# Patient Record
Sex: Female | Born: 1955
Health system: Southern US, Community
[De-identification: ages and names within clinical notes are randomized; demographics above are authoritative.]

## PROBLEM LIST (undated history)

## (undated) DIAGNOSIS — M5430 Sciatica, unspecified side: Secondary | ICD-10-CM

## (undated) DIAGNOSIS — I10 Essential (primary) hypertension: Secondary | ICD-10-CM

## (undated) DIAGNOSIS — E039 Hypothyroidism, unspecified: Secondary | ICD-10-CM

## (undated) DIAGNOSIS — K5900 Constipation, unspecified: Secondary | ICD-10-CM

## (undated) DIAGNOSIS — E785 Hyperlipidemia, unspecified: Secondary | ICD-10-CM

## (undated) DIAGNOSIS — E079 Disorder of thyroid, unspecified: Secondary | ICD-10-CM

## (undated) DIAGNOSIS — J45909 Unspecified asthma, uncomplicated: Secondary | ICD-10-CM

## (undated) DIAGNOSIS — L509 Urticaria, unspecified: Secondary | ICD-10-CM

## (undated) HISTORY — DX: Constipation, unspecified: K59.00

## (undated) HISTORY — DX: Sciatica, unspecified side: M54.30

## (undated) HISTORY — DX: Unspecified asthma, uncomplicated: J45.909

## (undated) HISTORY — DX: Urticaria, unspecified: L50.9

## (undated) HISTORY — PX: CATARACT EXTRACTION: SUR2

## (undated) NOTE — *Deleted (*Deleted)
[facility] [address] [place], [place], [address] Phone: [phone]   Fax:  [phone]  January 25, 2020   No Recipients  Physical Therapy Discharge Summary  Patient: Alexandra Brewer  MRN: [id number]  Date of Birth: 10-16-1955   Diagnosis: Radiculopathy, cervical region  Spondylolisthesis of lumbar region  Chronic low back pain without sciatica, unspecified back pain laterality  Abnormal posture  Other muscle spasm Referring Provider (PT): Juleen Starr PA-C    The above patient had been seen in Physical Therapy *** times of *** treatments scheduled with *** no shows and *** cancellations.  The treatment consisted of *** The patient is: {improved/worse/unchanged:[id number]}  Subjective: ***  Discharge Findings: ***  Functional Status at Discharge: ***  {ONGEX:[id number]}   Plan - 01/25/20 1734    Clinical Impression Statement Pt's neck and shoulder have improved and the focus of today's visit was on their lower back. Manual examation of the lumbar paraspinals and gluteal muscles was performed and these locations were tender to palpation so the patient was educated on the self-massage with the tennis ball exercise. Patient was given a HEP focusing on the lumbar spine and LE, including LTRs, supine clamshells with red theraband, supine marches, piriformis stretch, and seated hamstring stretch. Patient is discharged due to acheivement of goals.    Examination-Activity Limitations Carry;Lift;Reach Overhead    Examination-Participation Restrictions Cleaning;Laundry;Shop    Stability/Clinical Decision Making Evolving/Moderate complexity    Clinical Decision Making Moderate    Rehab Potential Good    PT Treatment/Interventions ADLs/Self Care Home Management;Electrical Stimulation;Iontophoresis 4mg /ml Dexamethasone;Moist Heat;Cryotherapy;Gait training;Stair training;Functional mobility training;Therapeutic  activities;Therapeutic exercise;Neuromuscular re-education;Patient/family education;Passive range of motion;Manual techniques;Taping;Dry needling    PT Next Visit Plan Discharge    PT Home Exercise Plan Continue with cervical, shoulder, and lumbar HEPs    Consulted and Agree with Plan of Care Patient           Sincerely,   Johnn Hai, SPT   CC No Recipients  [facility] [address] [place], [place], [address] Phone: [phone]   Fax:  [phone]  Patient: Alexandra Brewer  MRN: [id number]  Date of Birth: 1956/03/21

---

## 2001-12-08 ENCOUNTER — Emergency Department (HOSPITAL_COMMUNITY): Admission: EM | Admit: 2001-12-08 | Discharge: 2001-12-09 | Payer: Self-pay | Admitting: Emergency Medicine

## 2004-12-27 ENCOUNTER — Ambulatory Visit (HOSPITAL_COMMUNITY): Admission: RE | Admit: 2004-12-27 | Discharge: 2004-12-27 | Payer: Self-pay | Admitting: Internal Medicine

## 2005-12-12 ENCOUNTER — Ambulatory Visit: Payer: Self-pay | Admitting: Family Medicine

## 2005-12-13 ENCOUNTER — Ambulatory Visit: Payer: Self-pay | Admitting: *Deleted

## 2006-01-07 ENCOUNTER — Ambulatory Visit: Payer: Self-pay | Admitting: Family Medicine

## 2006-02-14 ENCOUNTER — Ambulatory Visit: Payer: Self-pay | Admitting: Family Medicine

## 2006-05-27 ENCOUNTER — Ambulatory Visit: Payer: Self-pay | Admitting: Family Medicine

## 2006-07-10 ENCOUNTER — Ambulatory Visit: Payer: Self-pay | Admitting: Family Medicine

## 2006-07-17 ENCOUNTER — Ambulatory Visit (HOSPITAL_COMMUNITY): Admission: RE | Admit: 2006-07-17 | Discharge: 2006-07-17 | Payer: Self-pay | Admitting: Internal Medicine

## 2006-08-08 ENCOUNTER — Ambulatory Visit: Payer: Self-pay | Admitting: Internal Medicine

## 2006-08-12 ENCOUNTER — Ambulatory Visit (HOSPITAL_COMMUNITY): Admission: RE | Admit: 2006-08-12 | Discharge: 2006-08-12 | Payer: Self-pay | Admitting: Internal Medicine

## 2006-09-24 ENCOUNTER — Ambulatory Visit: Payer: Self-pay | Admitting: Internal Medicine

## 2006-12-18 ENCOUNTER — Encounter (INDEPENDENT_AMBULATORY_CARE_PROVIDER_SITE_OTHER): Payer: Self-pay | Admitting: *Deleted

## 2007-02-20 ENCOUNTER — Ambulatory Visit: Payer: Self-pay | Admitting: Internal Medicine

## 2007-04-07 ENCOUNTER — Ambulatory Visit: Payer: Self-pay | Admitting: Family Medicine

## 2007-06-13 ENCOUNTER — Ambulatory Visit: Payer: Self-pay | Admitting: Internal Medicine

## 2007-07-03 ENCOUNTER — Ambulatory Visit: Payer: Self-pay | Admitting: Internal Medicine

## 2007-09-29 ENCOUNTER — Ambulatory Visit: Payer: Self-pay | Admitting: Internal Medicine

## 2007-09-29 ENCOUNTER — Encounter: Payer: Self-pay | Admitting: Family Medicine

## 2007-09-29 LAB — CONVERTED CEMR LAB
Chlamydia, DNA Probe: NEGATIVE
GC Probe Amp, Genital: NEGATIVE

## 2007-10-08 ENCOUNTER — Encounter: Payer: Self-pay | Admitting: Family Medicine

## 2007-10-08 ENCOUNTER — Ambulatory Visit: Payer: Self-pay | Admitting: Internal Medicine

## 2007-10-08 ENCOUNTER — Ambulatory Visit (HOSPITAL_COMMUNITY): Admission: RE | Admit: 2007-10-08 | Discharge: 2007-10-08 | Payer: Self-pay | Admitting: Family Medicine

## 2007-10-08 LAB — CONVERTED CEMR LAB
BUN: 8 mg/dL (ref 6–23)
Basophils Absolute: 0 10*3/uL (ref 0.0–0.1)
Basophils Relative: 1 % (ref 0–1)
CO2: 22 meq/L (ref 19–32)
Calcium: 9.5 mg/dL (ref 8.4–10.5)
Cholesterol: 161 mg/dL (ref 0–200)
Eosinophils Absolute: 0.1 10*3/uL (ref 0.0–0.7)
Hemoglobin: 13.8 g/dL (ref 12.0–15.0)
Lymphs Abs: 2.2 10*3/uL (ref 0.7–4.0)
MCHC: 32.2 g/dL (ref 30.0–36.0)
MCV: 90.7 fL (ref 78.0–100.0)
Monocytes Absolute: 0.6 10*3/uL (ref 0.1–1.0)
Monocytes Relative: 9 % (ref 3–12)
Neutro Abs: 4.4 10*3/uL (ref 1.7–7.7)
Potassium: 4.1 meq/L (ref 3.5–5.3)
Total Bilirubin: 0.4 mg/dL (ref 0.3–1.2)

## 2009-11-03 ENCOUNTER — Emergency Department (HOSPITAL_COMMUNITY): Admission: EM | Admit: 2009-11-03 | Discharge: 2009-11-03 | Payer: Self-pay | Admitting: Emergency Medicine

## 2009-11-05 ENCOUNTER — Emergency Department (HOSPITAL_COMMUNITY): Admission: EM | Admit: 2009-11-05 | Discharge: 2009-11-05 | Payer: Self-pay | Admitting: Emergency Medicine

## 2009-11-21 ENCOUNTER — Ambulatory Visit (HOSPITAL_COMMUNITY): Admission: RE | Admit: 2009-11-21 | Discharge: 2009-11-21 | Payer: Self-pay | Admitting: General Surgery

## 2010-04-23 ENCOUNTER — Encounter: Payer: Self-pay | Admitting: Internal Medicine

## 2010-06-16 LAB — COMPREHENSIVE METABOLIC PANEL
ALT: 27 U/L (ref 0–35)
AST: 30 U/L (ref 0–37)
Albumin: 4.1 g/dL (ref 3.5–5.2)
Alkaline Phosphatase: 103 U/L (ref 39–117)
BUN: 8 mg/dL (ref 6–23)
Chloride: 107 mEq/L (ref 96–112)
Creatinine, Ser: 0.53 mg/dL (ref 0.4–1.2)
Potassium: 3.5 mEq/L (ref 3.5–5.1)
Sodium: 139 mEq/L (ref 135–145)
Total Protein: 7.7 g/dL (ref 6.0–8.3)

## 2010-06-16 LAB — CBC
HCT: 41.4 % (ref 36.0–46.0)
Hemoglobin: 13.9 g/dL (ref 12.0–15.0)
MCV: 85.9 fL (ref 78.0–100.0)
RBC: 4.82 MIL/uL (ref 3.87–5.11)
RDW: 13.5 % (ref 11.5–15.5)
WBC: 10.9 10*3/uL — ABNORMAL HIGH (ref 4.0–10.5)

## 2010-06-16 LAB — TYPE AND SCREEN
ABO/RH(D): O POS
Antibody Screen: NEGATIVE

## 2010-06-16 LAB — PROTIME-INR
INR: 0.99 (ref 0.00–1.49)
Prothrombin Time: 13.3 seconds (ref 11.6–15.2)

## 2010-06-16 LAB — APTT: aPTT: 29 seconds (ref 24–37)

## 2010-06-16 LAB — ABO/RH: ABO/RH(D): O POS
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
## 2013-05-09 ENCOUNTER — Encounter (HOSPITAL_COMMUNITY): Payer: Self-pay | Admitting: Emergency Medicine

## 2013-05-09 ENCOUNTER — Emergency Department (INDEPENDENT_AMBULATORY_CARE_PROVIDER_SITE_OTHER)
Admission: EM | Admit: 2013-05-09 | Discharge: 2013-05-09 | Disposition: A | Discharge status: Home / Self Care | Payer: Self-pay | Source: Home / Self Care | Attending: Family Medicine | Admitting: Family Medicine

## 2013-05-09 DIAGNOSIS — L219 Seborrheic dermatitis, unspecified: Secondary | ICD-10-CM

## 2013-05-09 MED ORDER — NEOMYCIN-POLYMYXIN-HC 3.5-10000-1 OT SUSP: 4.0000 [drp] | Freq: Three times a day (TID) | OTIC | Status: DC

## 2013-05-09 MED ORDER — TRIAMCINOLONE ACETONIDE 0.1 % EX CREA: 1.0000 | TOPICAL_CREAM | Freq: Two times a day (BID) | CUTANEOUS | Status: DC

## 2013-05-09 NOTE — ED Notes (Signed)
Pt c/o bilateral ear fullness/clogged and itchy onset 3 months Also has a mild ST Denies f/v/n/d, SOB, wheezing Alert w/no signs of acute distress.

## 2013-05-09 NOTE — ED Provider Notes (Signed)
Alexandra Brewer is a 58 y.o. female who presents to Urgent Care today for bilateral ear pruritus. Patient notes 3 months of bilateral ear itching and fullness. She notes a scaly rash in and around both ears including the earlobes and ear canal. She denies any pain fevers or chills. She was seen by another physician who prescribed Keflex which did not help.   History reviewed. No pertinent past medical history. History  Substance Use Topics  . Smoking status: Never Smoker   . Smokeless tobacco: Not on file  . Alcohol Use: No   ROS as above Medications: No current facility-administered medications for this encounter.   Current Outpatient Prescriptions  Medication Sig Dispense Refill  . neomycin-polymyxin-hydrocortisone (CORTISPORIN) 3.5-10000-1 otic suspension Place 4 drops into both ears 3 (three) times daily. Spanish  10 mL  1  . triamcinolone cream (KENALOG) 0.1 % Apply 1 application topically 2 (two) times daily. To ear.  Spanish  60 g  1    Exam:  BP 140/90  Pulse 67  Temp(Src) 98.2 F (36.8 C) (Oral)  Resp 18  SpO2 100% Gen: Well NAD HEENT: EOMI,  MMM EAR canal with crusting. Tympanic membranes are normal appearing bilaterally. Earlobes with mildly erythematous skin with scaling and crusting. Consistent with seborrheic dermatitis   Assessment and Plan: 58 y.o. female with seborrheic dermatitis of the earlobes and mild ear canal involvement. Plan to treat with triamcinolone cream and Cortisporin ear drops. Followup with primary care provider  Discussed warning signs or symptoms. Please see discharge instructions. Patient expresses understanding.    Gregor Hams, MD 05/09/13 [phone]

## 2013-05-09 NOTE — Discharge Instructions (Signed)
Thank you for coming in today. Dermatitis seborreica  (Seborrheic Dermatitis) La dermatitis seborreica se observa como una zona de color rojo o rosado en la piel con escamas grasas. Aparece generalmente en el cuero cabelludo, las cejas, la nariz, la zona de la barba y detrs de las [place]. Tambin puede aparecer en la parte central del pecho. Generalmente ocurre en las zonas donde hay ms glndulas de secrecin grasa (sebceas). Este problema tambin se conoce con el nombre de caspa. Cuando afecta el cuero cabelludo de un beb, se denomina costra lctea. Puede aparecer y desaparecer sin motivo conocido. Puede ocurrir en cualquier momento de la vida, desde la infancia hasta la vejez.  CAUSAS  La causa es desconocida. No es el resultado de muy poca humedad o [place]. En [employer], los brotes de dermatitis seborreica parecen estar causados por el estrs. Tambin es frecuente que aparezca en personas con ciertas enfermedades como la enfermedad de Parkinson o el VIH / SIDA.  SNTOMAS   Escamas gruesas en el cuero cabelludo.  Enrojecimiento en la cara o en las axilas.  La piel puede parecer grasa o seca, pero las cremas hidratantes no la mejoran.  En los lactantes, la dermatitis seborreica aparece como una lesin roja y escamosa que no parece molestar al beb. En algunos bebs slo afecta al cuero cabelludo. En otros casos, tambin afecta a los pliegues del cuello, las [place], la [place], o detrs de las [place].  En los adultos y [place], la dermatitis seborreica puede afectar el cuero cabelludo. Puede parecer en reas o extenderse, con reas de enrojecimiento y descamacin. Otras reas comnmente afectadas son:  Las cejas.  Los prpados.  La frente.  La piel detrs de las [place].  Los odos externos.  El pecho.  Las [place].  Los pliegues de la Lawyer.  Los pliegues debajo de las [place].  La piel [employer].  La ingle.  Algunos adultos y adolescentes siente  picazn o ardor en las reas afectadas. DIAGNSTICO  El mdico puede diagnosticar el problema haciendo un examen fsico.  TRATAMIENTO   Ungentos, cremas y lociones con cortisona (corticoides), ayudan a disminuir la inflamacin.  Los bebs pueden ser tratados con aceite para bebs, para [employer], y luego se deben lavar con champ para bebs. Si esto no ayuda, un corticoide tpico recetado puede ser de [place].  Los adultos tambin pueden usar [employer].  El mdico prescribir una crema con corticoides y un champ que contenga un medicamento contra los hongos (ketoconazole). La crema con hidrocortisona o la antihongos puede frotarse directamente en las zonas de la dermatitis seborreica. Los hongos no son la causa del trastorno Education officer, museum. En los bebs, la dermatitis seborreica generalmente es Risk manager ao de vida. Tiende a desaparecer sin tratamiento cuando el nio crece. Sin embargo, Adult nurse [employer] aos de Ship broker. En los adultos y [place], la dermatitis seborreica tiende a ser una enfermedad crnica que aparece y desaparece durante muchos aos.  INSTRUCCIONES PARA EL CUIDADO EN EL HOGAR   Use los medicamentos recetados segn las indicaciones.  En los bebs, no elimine de [employer] o polvillos del cuero cabelludo con un peine o por otros medios. Esto puede causarle la prdida del cabello. SOLICITE ATENCIN MDICA SI:   El problema no mejora con los champs medicinales, las lociones u otros medicamentos que le prescriba el mdico.  Tiene otras preguntas o preocupaciones. Document Released: 03/05/2012 [facility] Patient Information 2014 [place], [place].

## 2014-11-17 ENCOUNTER — Ambulatory Visit: Payer: Self-pay | Attending: Internal Medicine

## 2015-01-11 ENCOUNTER — Encounter: Payer: Self-pay | Admitting: Internal Medicine

## 2015-01-11 ENCOUNTER — Ambulatory Visit: Payer: Self-pay | Attending: Internal Medicine | Admitting: Internal Medicine

## 2015-01-11 VITALS — BP 124/79 | HR 68 | Temp 98.0°F | Resp 16 | Wt 155.4 lb

## 2015-01-11 DIAGNOSIS — I951 Orthostatic hypotension: Secondary | ICD-10-CM | POA: Insufficient documentation

## 2015-01-11 DIAGNOSIS — Z23 Encounter for immunization: Secondary | ICD-10-CM | POA: Insufficient documentation

## 2015-01-11 DIAGNOSIS — R030 Elevated blood-pressure reading, without diagnosis of hypertension: Secondary | ICD-10-CM | POA: Insufficient documentation

## 2015-01-11 DIAGNOSIS — Z0000 Encounter for general adult medical examination without abnormal findings: Secondary | ICD-10-CM

## 2015-01-11 NOTE — Progress Notes (Signed)
Virtual interpreter used  Patient states she is her for a physical Patient also complains of feeling dizzy for the past three months

## 2015-01-11 NOTE — Progress Notes (Signed)
Patient ID: Genetta Fiero, female   DOB: Feb 11, 1956, 59 y.o.   MRN: [id number]   IZT:[id number]  [id number]:[id number]  DOB - 16-Nov-1955  CC:  Chief Complaint  Patient presents with  . Annual Exam       HPI: Levonia Wolfley is a 59 y.o. female here today to establish medical care. Patient has no past medical history. Patient reports that for the past 3 months she has had concerns of dizziness. She notices dizziness when she stands up. She states that it feels like her head is spinning and she will loss balance. She denies falls.  She is postmenopausal. She is in need of a pap smear and mammogram. No other concerns today.    No Known Allergies History reviewed. No pertinent past medical history. Current Outpatient Prescriptions on File Prior to Visit  Medication Sig Dispense Refill  . neomycin-polymyxin-hydrocortisone (CORTISPORIN) 3.5-10000-1 otic suspension Place 4 drops into both ears 3 (three) times daily. Spanish 10 mL 1  . triamcinolone cream (KENALOG) 0.1 % Apply 1 application topically 2 (two) times daily. To ear.  Spanish 60 g 1   No current facility-administered medications on file prior to visit.   History reviewed. No pertinent family history. Social History   Social History  . Marital Status: Single    Spouse Name: N/A  . Number of Children: N/A  . Years of Education: N/A   Occupational History  . Not on file.   Social History Main Topics  . Smoking status: Never Smoker   . Smokeless tobacco: Not on file  . Alcohol Use: No  . Drug Use: No  . Sexual Activity: Not on file   Other Topics Concern  . Not on file   Social History Narrative    Review of Systems  Respiratory: Negative.   Cardiovascular: Negative for chest pain, palpitations and leg swelling.  Gastrointestinal: Positive for constipation.  Neurological: Positive for dizziness. Negative for headaches.  All other systems reviewed and are negative.    Objective:   Filed Vitals:   01/11/15 1655  BP: 124/79  Pulse: 68  Temp:   Resp:     Physical Exam: Constitutional: Patient appears well-developed and well-nourished. No distress. HENT: Normocephalic, atraumatic, External right and left ear normal. Oropharynx is clear and moist.  Eyes: Conjunctivae and EOM are normal. PERRLA, no scleral icterus. Neck: Normal ROM. Neck supple. No JVD. No tracheal deviation. No thyromegaly. CVS: RRR, S1/S2 +, no murmurs, no gallops, no carotid bruit.  Pulmonary: Effort and breath sounds normal, no stridor, rhonchi, wheezes, rales.  Musculoskeletal: Normal range of motion. No edema and no tenderness.  Neuro: Alert. Normal reflexes, muscle tone coordination. No cranial nerve deficit. Skin: Skin is warm and dry. No rash noted. Not diaphoretic. No erythema. No pallor. Psychiatric: Normal mood and affect. Behavior, judgment, thought content normal.  Lab Results  Component Value Date   WBC 10.9* 11/03/2009   HGB 13.9 11/03/2009   HCT 41.4 11/03/2009   MCV 85.9 11/03/2009   PLT 316 11/03/2009   Lab Results  Component Value Date   CREATININE 0.53 11/03/2009   BUN 8 11/03/2009   NA 139 11/03/2009   K 3.5 11/03/2009   CL 107 11/03/2009   CO2 25 11/03/2009    No results found for: HGBA1C Lipid Panel     Component Value Date/Time   CHOL 161 10/08/2007 2025   TRIG 108 10/08/2007 2025   HDL 51 10/08/2007 2025   CHOLHDL 3.2 Ratio 10/08/2007 2025   VLDL 22  10/08/2007 2025   [place] 88 10/08/2007 2025       Assessment and plan:   Stevana was seen today for annual exam.  Diagnoses and all orders for this visit:  Orthostatic hypotension Patient will stay hydrated, increase salt intake slightly, and stand up slowly. Patients BP is elevated today---I will recheck in 2 weeks when she comes back for a pap smear  Healthcare maintenance -     Flu Vaccine QUAD 36+ mos PF IM (Fluarix & Fluzone Quad PF)  Due to language barrier, an interpreter was present during the history-taking  and subsequent discussion (and for part of the physical exam) with this patient.    Return for 2 weeks pap.     Lance Bosch, [place] [facility] [phone] 01/11/2015, 4:55 PM

## 2015-01-11 NOTE — Patient Instructions (Signed)
Hipotensin ortosttica (Orthostatic Hypotension) La hipotensin ortosttica es la cada sbita de la presin arterial. que sucede al ponerse de pie rpidamente despus de haber estado sentado o acostado. Puede sentirse mareado o aturdido. Esto puede durar unos pocos segundos o unos minutos. Generalmente, no es un problema grave. Sin embargo, si sucede con frecuencia o empeora, puede ser un signo de algo ms grave. [place] cosas que pueden causar hipotensin ortosttica, se incluyen:   Prdida de lquidos corporales (deshidratacin).  Medicamentos que disminuyen la presin arterial.  Cambios repentinos en la postura, por ejemplo, ponerse de pie rpidamente despus de haber estado sentado o acostado.  Consumo excesivo de medicamentos. [place].  Desmayos o [employer].  Frecuencia cardaca acelerada.  Debilidad.  Sensacin de cansancio (fatiga). DIAGNSTICO  El mdico puede hacer diversas cosas para ayudar a Midwife y a Investment banker, corporate. Estas pueden incluir:   Preguntar los antecedentes mdicos y Field seismologist un examen fsico.  Controlar la presin arterial. El mdico le controlar la presin arterial cuando usted est:  Acostado.  Sentado.  De pie.  Acostado en una mesa basculante. En esta prueba, se acostar en una mesa que pasa de una posicin horizontal a una posicin vertical. Lo sujetarn a la mesa. Esta prueba mide la presin arterial y la frecuencia cardaca en diferentes posiciones. TRATAMIENTO  El tratamiento depender de la causa. Los tratamientos posibles incluyen lo siguiente:   Sales promotion account executive de los medicamentos.  Usar medias de compresin en la parte inferior de las piernas.  Ponerse de pie lentamente despus de haber estado sentado o acostado.  Consumir ms sal.  Hacer comidas pequeas y frecuentes.  En algunos casos, recibir lquido por va intravenosa (IV).  Tomar  medicamentos para mejorar la retencin de lquido. [place] los medicamentos de venta libre o recetados solamente segn las indicaciones del [place] instrucciones del mdico para modificar la dosificacin de sus medicamentos actuales.  No deje de tomar los medicamentos ni modifique la dosis por su cuenta.  Pngase de pie lentamente despus de haber estado sentado o acostado. Esto permite que el cuerpo se adapte a Holiday representative.  Use las medias de compresin segn las indicaciones.  Consuma la sal adicional segn las indicaciones.  No agregue sal adicional a su dieta si no se lo indica el mdico.  Haga comidas pequeas y frecuentes.  Evite ponerse de pie de repente despus de comer.  Evite las duchas calientes o el calor excesivo segn las indicaciones del mdico.  Cumpla con todas las visitas de control. SOLICITE ATENCIN MDICA SI:  Sigue sintindose mareado o aturdido despus de haberse puesto de pie.  Se siente atontado o confundido.  Tiene fro, sudoracin fra o Higher education careers adviser (nuseas).  Tiene visin borrosa.  Le falta el aire. SOLICITE ATENCIN MDICA DE INMEDIATO SI:   Se desmaya despus de ponerse de pie.  Siente dolor en el pecho.  Tiene dificultad para respirar.  Pierde la sensibilidad o el movimiento en los brazos o en las piernas.  Tiene problemas para articular las palabras o dificultad para hablar, o no puede hablar. ASEGRESE DE QUE:   Comprende estas instrucciones.  Controlar su afeccin.  Recibir ayuda de inmediato si no mejora o si empeora.   Esta informacin no tiene Marine scientist el consejo del mdico. Asegrese de hacerle al mdico cualquier pregunta que tenga.   Document Released:  12/27/2004 Document Revised: 03/24/2013 Elsevier Interactive Patient Education [employer].

## 2015-01-26 ENCOUNTER — Other Ambulatory Visit: Payer: Self-pay | Admitting: Internal Medicine

## 2015-02-23 ENCOUNTER — Other Ambulatory Visit: Payer: Self-pay | Admitting: Internal Medicine

## 2015-05-04 ENCOUNTER — Ambulatory Visit: Payer: Self-pay

## 2015-06-13 ENCOUNTER — Ambulatory Visit: Payer: Self-pay | Attending: Family Medicine

## 2015-07-06 ENCOUNTER — Ambulatory Visit: Payer: Self-pay | Attending: Internal Medicine | Admitting: Internal Medicine

## 2015-07-06 ENCOUNTER — Encounter: Payer: Self-pay | Admitting: Internal Medicine

## 2015-07-06 VITALS — BP 142/88 | HR 75 | Temp 97.9°F | Resp 16 | Ht 61.0 in | Wt 159.6 lb

## 2015-07-06 DIAGNOSIS — Z1239 Encounter for other screening for malignant neoplasm of breast: Secondary | ICD-10-CM

## 2015-07-06 DIAGNOSIS — R109 Unspecified abdominal pain: Secondary | ICD-10-CM | POA: Insufficient documentation

## 2015-07-06 DIAGNOSIS — Z124 Encounter for screening for malignant neoplasm of cervix: Secondary | ICD-10-CM

## 2015-07-06 DIAGNOSIS — Z0000 Encounter for general adult medical examination without abnormal findings: Secondary | ICD-10-CM

## 2015-07-06 DIAGNOSIS — K5900 Constipation, unspecified: Secondary | ICD-10-CM

## 2015-07-06 MED ORDER — POLYETHYLENE GLYCOL 3350 17 GM/SCOOP PO POWD: 17.0000 g | Freq: Every day | ORAL | Status: DC

## 2015-07-06 NOTE — Progress Notes (Signed)
Patient's here for pap.   Patient c/o constipation x14months that flares up w/o use of natural supplements for constipation. Patient c/o slight gas and discomfort when not using the supplements.  Patient agree to STD testing.

## 2015-07-07 MED FILL — POLYETHYLENE GLYCOL 3350: 22 days supply | Qty: 510 | Fill #0

## 2015-07-08 LAB — CERVICOVAGINAL ANCILLARY ONLY
Chlamydia: NEGATIVE
Neisseria Gonorrhea: NEGATIVE
Wet Prep (BD Affirm): NEGATIVE

## 2015-07-08 LAB — CYTOLOGY - PAP

## 2015-07-14 ENCOUNTER — Ambulatory Visit: Payer: Self-pay | Attending: Internal Medicine

## 2015-07-14 DIAGNOSIS — K5900 Constipation, unspecified: Secondary | ICD-10-CM | POA: Insufficient documentation

## 2015-07-14 DIAGNOSIS — Z1321 Encounter for screening for nutritional disorder: Secondary | ICD-10-CM

## 2015-07-14 DIAGNOSIS — Z0000 Encounter for general adult medical examination without abnormal findings: Secondary | ICD-10-CM | POA: Insufficient documentation

## 2015-07-14 DIAGNOSIS — R109 Unspecified abdominal pain: Secondary | ICD-10-CM | POA: Insufficient documentation

## 2015-07-14 LAB — CBC WITH DIFFERENTIAL/PLATELET
BASOS PCT: 1 %
Basophils Absolute: 71 cells/uL (ref 0–200)
EOS PCT: 2 %
Eosinophils Absolute: 142 cells/uL (ref 15–500)
HCT: 42.5 % (ref 35.0–45.0)
HEMOGLOBIN: 14.1 g/dL (ref 11.7–15.5)
LYMPHS ABS: 1988 {cells}/uL (ref 850–3900)
Lymphocytes Relative: 28 %
MCH: 28.9 pg (ref 27.0–33.0)
MCHC: 33.2 g/dL (ref 32.0–36.0)
MCV: 87.1 fL (ref 80.0–100.0)
MPV: 10.8 fL (ref 7.5–12.5)
Monocytes Absolute: 497 cells/uL (ref 200–950)
Monocytes Relative: 7 %
NEUTROS ABS: 4402 {cells}/uL (ref 1500–7800)
Neutrophils Relative %: 62 %
PLATELETS: 363 10*3/uL (ref 140–400)
RBC: 4.88 MIL/uL (ref 3.80–5.10)
RDW: 13.8 % (ref 11.0–15.0)
WBC: 7.1 10*3/uL (ref 3.8–10.8)

## 2015-07-14 LAB — LIPID PANEL
CHOL/HDL RATIO: 3.8 ratio (ref <=5.0)
Cholesterol: 223 mg/dL — ABNORMAL HIGH (ref 125–200)
HDL: 58 mg/dL (ref >=46)
LDL CALC: 147 mg/dL — AB (ref <130)
Triglycerides: 92 mg/dL (ref <150)
VLDL: 18 mg/dL (ref <30)

## 2015-07-14 LAB — BASIC METABOLIC PANEL
BUN: 13 mg/dL (ref 7–25)
CO2: 27 mmol/L (ref 20–31)
Calcium: 9.8 mg/dL (ref 8.6–10.4)
Chloride: 104 mmol/L (ref 98–110)
Creat: 0.72 mg/dL (ref 0.50–1.05)
GLUCOSE: 85 mg/dL (ref 65–99)
POTASSIUM: 4.5 mmol/L (ref 3.5–5.3)
SODIUM: 140 mmol/L (ref 135–146)

## 2015-07-14 LAB — TSH: TSH: 10.5 m[IU]/L — AB

## 2015-07-14 NOTE — Progress Notes (Signed)
Patient ID: Alexandra Brewer, female   DOB: [birth year], 60 y.o.   MRN: [id number]  CC: pap smear  HPI: Alexandra Brewer is a 60 y.o. female here today for a pap smear. Patient states that she is due for her pap smear. All previous pap's have been WNL. She denies vaginal discharge, itch, or odor. She is not up to date on her mammogram. She notes that she has been suffering from constipation for the past 3 weeks and has not tried anything for relief. She denies any recent diet changes.   No Known Allergies History reviewed. No pertinent past medical history. Current Outpatient Prescriptions on File Prior to Visit  Medication Sig Dispense Refill  . neomycin-polymyxin-hydrocortisone (CORTISPORIN) 3.5-10000-1 otic suspension Place 4 drops into both ears 3 (three) times daily. Spanish (Patient not taking: Reported on 07/06/2015) 10 mL 1  . triamcinolone cream (KENALOG) 0.1 % Apply 1 application topically 2 (two) times daily. To ear.  Spanish (Patient not taking: Reported on 07/06/2015) 60 g 1   No current facility-administered medications on file prior to visit.   History reviewed. No pertinent family history. Social History   Social History  . Marital Status: Single    Spouse Name: N/A  . Number of Children: N/A  . Years of Education: N/A   Occupational History  . Not on file.   Social History Main Topics  . Smoking status: Never Smoker   . Smokeless tobacco: Not on file  . Alcohol Use: No  . Drug Use: No  . Sexual Activity: Not on file   Other Topics Concern  . Not on file   Social History Narrative    Review of Systems: Other than what is stated in HPI, all other systems are negative.   Objective:   Filed Vitals:   07/06/15 1649  BP: 142/88  Pulse: 75  Temp: 97.9 F (36.6 C)  Resp: 16    Physical Exam: Constitutional: Patient appears well-developed and well-nourished. No distress. HENT: Normocephalic, atraumatic, External right and left ear normal. Oropharynx is  clear and moist.  Eyes: Conjunctivae and EOM are normal. PERRLA, no scleral icterus. Neck: Normal ROM. Neck supple. No JVD. No tracheal deviation. No thyromegaly. CVS: RRR, S1/S2 +, no murmurs, no gallops, no carotid bruit.  Pulmonary: Effort and breath sounds normal, no stridor, rhonchi, wheezes, rales.  Abdominal: Soft. BS +,  no distension, tenderness, rebound or guarding.  Musculoskeletal: Normal range of motion. No edema and no tenderness.  Lymphadenopathy: No lymphadenopathy noted, cervical, inguinal or axillary Neuro: Alert. Normal reflexes, muscle tone coordination. No cranial nerve deficit. Skin: Skin is warm and dry. No rash noted. Not diaphoretic. No erythema. No pallor. Psychiatric: Normal mood and affect. Behavior, judgment, thought content normal. Genitalia: Normal female without lesion, discharge or tenderness, NSSA, NT, no adnexal masses felt on exam  Breast: No tenderness, masses, or nipple abnormality    Lab Results  Component Value Date   WBC 7.1 07/14/2015   HGB 14.1 07/14/2015   HCT 42.5 07/14/2015   MCV 87.1 07/14/2015   PLT 363 07/14/2015   Lab Results  Component Value Date   CREATININE 0.72 07/14/2015   BUN 13 07/14/2015   NA 140 07/14/2015   K 4.5 07/14/2015   CL 104 07/14/2015   CO2 27 07/14/2015    No results found for: HGBA1C Lipid Panel     Component Value Date/Time   CHOL 223* 07/14/2015 0908   TRIG 92 07/14/2015 0908   HDL 58 07/14/2015 0908  CHOLHDL 3.8 07/14/2015 0908   VLDL 18 07/14/2015 0908   LDLCALC 147* 07/14/2015 0908       Assessment and plan:   [name] was seen today for constipation and abdominal pain.  Diagnoses and all orders for this visit:  Papanicolaou smear -     Cytology - PAP [place]  Breast cancer screening -     MM Digital Screening; Future  Constipation, unspecified constipation type -     Begin polyethylene glycol powder (GLYCOLAX/MIRALAX) powder; Take 17 g by mouth daily. Explained that the daily  recommended amount of fiber is 25 g, went over high fiber foods, encourage increased water intake, miralax use, and increased physical activity.  Patient given constipation handout.   Annual physical exam -     Lipid panel; Future -     Basic Metabolic Panel; Future -     TSH; Future -     CBC with Differential; Future   Return in about 1 week (around 07/13/2015) for lab visit .       Lance Bosch, [place] [facility] [phone] 07/14/2015, 10:30 PM

## 2015-07-14 NOTE — Progress Notes (Signed)
Lab visit only. 

## 2015-07-14 NOTE — Patient Instructions (Signed)
Patient's here for lab visit only. 

## 2015-07-18 ENCOUNTER — Other Ambulatory Visit: Payer: Self-pay

## 2015-07-18 DIAGNOSIS — K5900 Constipation, unspecified: Secondary | ICD-10-CM

## 2015-07-18 NOTE — Telephone Encounter (Signed)
Spoke to Alexandra Brewer [id number] at [facility] interpreter. Interpreter verified name and DOB. Patient was given lab results, verbalized understanding and will pick up her new rx of lipitor.   Notes Recorded by Lance Bosch, NP on 07/15/2015 at 12:12 AM Cholesterol is elevated. Please go over things that increase cholesterol levels such as breads pasta, rice, butters, fried foods, etc. Please send Lipitor 20 mg to take every evening with dinner. Please explain that high cholesterol places her at risk for stroke and heart disease Thyroid test is abnormal. She should have a repeat TSH and Free T4 lab test in 3-6 months just to follow up to make sure it is back to normal.

## 2015-07-18 NOTE — Telephone Encounter (Signed)
-----   Message from Lance Bosch, NP sent at 07/14/2015  9:38 PM EDT ----- Cytology is normal, repeat pap in 3 years

## 2015-07-19 ENCOUNTER — Other Ambulatory Visit: Payer: Self-pay

## 2015-07-19 MED ORDER — ATORVASTATIN CALCIUM 20 MG PO TABS: 20.0000 mg | ORAL_TABLET | Freq: Every day | ORAL | Status: DC

## 2015-07-19 MED FILL — ATORVASTATIN 20 MG TABLET: 20 | 30 days supply | Qty: 30 | Fill #0

## 2015-07-19 NOTE — Telephone Encounter (Signed)
Pt. Called stating that on her last OV she was prescribed polyethylene glycol powder (GLYCOLAX/MIRALAX) powder. Pt. Stated that she is still having problems with her stomach. Please f/u with pt.

## 2015-07-20 ENCOUNTER — Ambulatory Visit (HOSPITAL_COMMUNITY)
Admission: EM | Admit: 2015-07-20 | Discharge: 2015-07-20 | Disposition: A | Discharge status: Home / Self Care | Payer: Self-pay | Attending: Family Medicine | Admitting: Family Medicine

## 2015-07-20 ENCOUNTER — Encounter (HOSPITAL_COMMUNITY): Payer: Self-pay | Admitting: Emergency Medicine

## 2015-07-20 DIAGNOSIS — K5901 Slow transit constipation: Secondary | ICD-10-CM

## 2015-07-20 LAB — POCT URINALYSIS DIP (DEVICE)
BILIRUBIN URINE: NEGATIVE
GLUCOSE, UA: NEGATIVE mg/dL
Hgb urine dipstick: NEGATIVE
KETONES UR: NEGATIVE mg/dL
NITRITE: NEGATIVE
PH: 7 (ref 5.0–8.0)
Protein, ur: NEGATIVE mg/dL
Specific Gravity, Urine: 1.01 (ref 1.005–1.030)
Urobilinogen, UA: 0.2 mg/dL (ref 0.0–1.0)

## 2015-07-20 MED ORDER — PEG 3350-KCL-NA BICARB-NACL 420 G PO SOLR: ORAL | Status: DC

## 2015-07-20 NOTE — ED Notes (Signed)
The patient presented to the [facility] with a complaint of abdominal pain x 1 month.

## 2015-07-20 NOTE — ED Provider Notes (Signed)
CSN: [id number]     Arrival date & time 07/20/15  1546 History   First MD Initiated Contact with Patient 07/20/15 1731     Chief Complaint  Patient presents with  . Abdominal Pain   (Consider location/radiation/quality/duration/timing/severity/associated sxs/prior Treatment) HPI Comments: 60 year old female states she is having problems with her stomach. She specifically states she is constipated and is not havingnormal bowel movements. She is also having pain in the right lateral abdomen that radiates to just under the right ribs. This comes and goes. She saw her PCP recently for a routine evaluation as well as the complaint of constipation. She was prescribed MiraLAX but after taking 3 doses this apparently is not working for her.   History reviewed. No pertinent past medical history. History reviewed. No pertinent past surgical history. History reviewed. No pertinent family history. Social History  Substance Use Topics  . Smoking status: Never Smoker   . Smokeless tobacco: None  . Alcohol Use: No   OB History    No data available     Review of Systems  Constitutional: Negative for fever, activity change and fatigue.  HENT: Negative.   Respiratory: Negative.   Cardiovascular: Negative for chest pain.  Gastrointestinal: Positive for abdominal pain and constipation. Negative for nausea, vomiting and diarrhea.  Genitourinary: Negative.  Negative for dysuria, frequency and pelvic pain.  Musculoskeletal: Negative.   Skin: Negative.   Neurological: Negative.     Allergies  Penicillins  Home Medications   Prior to Admission medications   Medication Sig Start Date End Date Taking? Authorizing Provider  atorvastatin (LIPITOR) 20 MG tablet Take 1 tablet (20 mg total) by mouth daily. Take every evening at dinner. 07/19/15  Yes Lance Bosch, NP  polyethylene glycol powder (GLYCOLAX/MIRALAX) powder Take 17 g by mouth daily. 07/06/15   Lance Bosch, NP  polyethylene  glycol-electrolytes (NULYTELY/GOLYTELY) 420 g solution Drink 1 glass of liquid every 30 minutes times 4 glasses. If no results in 4 hours repeat. 07/20/15   Janne Napoleon, NP   Meds Ordered and Administered this Visit  Medications - No data to display  There were no vitals taken for this visit. No data found.   Physical Exam  Constitutional: She is oriented to person, place, and time. She appears well-developed and well-nourished. No distress.  Eyes: EOM are normal.  Neck: Normal range of motion. Neck supple.  Cardiovascular: Normal rate.   Pulmonary/Chest: Effort normal. No respiratory distress.  Abdominal: Soft. Bowel sounds are normal. She exhibits no mass. There is no rebound and no guarding.  Mild tenderness to deep palpation of the right lateral abdomen. No tenderness to the right upper quadrant. No Murphy sign. No tenderness over the right lower quadrant.  Musculoskeletal: She exhibits no edema.  Neurological: She is alert and oriented to person, place, and time. She exhibits normal muscle tone.  Skin: Skin is warm and dry.  Psychiatric: She has a normal mood and affect.  Nursing note and vitals reviewed.   ED Course  Procedures (including critical care time)  Labs Review Labs Reviewed  POCT URINALYSIS DIP (DEVICE) - Abnormal; Notable for the following:    Leukocytes, UA SMALL (*)    All other components within normal limits    Imaging Review No results found.   Visual Acuity Review  Right Eye Distance:   Left Eye Distance:   Bilateral Distance:    Right Eye Near:   Left Eye Near:    Bilateral Near:  MDM   1. Slow transit constipation    Meds ordered this encounter  Medications  . polyethylene glycol-electrolytes (NULYTELY/GOLYTELY) 420 g solution    Sig: Drink 1 glass of liquid every 30 minutes times 4 glasses. If no results in 4 hours repeat.    Dispense:  2000 mL    Refill:  0    Order Specific Question:  Supervising Provider    Answer:   Melony Overly [phone]    Follow with your doctor as needed  Verbal and written discharge instructions have been reviewed and given to the patient  by the provider to include medications and other care measures.   Janne Napoleon, NP 07/20/15 1806  Janne Napoleon, NP 07/20/15 1807

## 2015-07-20 NOTE — Discharge Instructions (Signed)
Constipation, Adult °Constipation is when a person has fewer than three bowel movements a week, has difficulty having a bowel movement, or has stools that are dry, hard, or larger than normal. As people grow older, constipation is more common. A low-fiber diet, not taking in enough fluids, and taking certain medicines may make constipation worse.  °CAUSES  °· Certain medicines, such as antidepressants, pain medicine, iron supplements, antacids, and water pills.   °· Certain diseases, such as diabetes, irritable bowel syndrome (IBS), thyroid disease, or depression.   °· Not drinking enough water.   °· Not eating enough fiber-rich foods.   °· Stress or travel.   °· Lack of physical activity or exercise.   °· Ignoring the urge to have a bowel movement.   °· Using laxatives too much.   °SIGNS AND SYMPTOMS  °· Having fewer than three bowel movements a week.   °· Straining to have a bowel movement.   °· Having stools that are hard, dry, or larger than normal.   °· Feeling full or bloated.   °· Pain in the lower abdomen.   °· Not feeling relief after having a bowel movement.   °DIAGNOSIS  °Your health care provider will take a medical history and perform a physical exam. Further testing may be done for severe constipation. Some tests may include: °· A barium enema X-ray to examine your rectum, colon, and, sometimes, your small intestine.   °· A sigmoidoscopy to examine your lower colon.   °· A colonoscopy to examine your entire colon. °TREATMENT  °Treatment will depend on the severity of your constipation and what is causing it. Some dietary treatments include drinking more fluids and eating more fiber-rich foods. Lifestyle treatments may include regular exercise. If these diet and lifestyle recommendations do not help, your health care provider may recommend taking over-the-counter laxative medicines to help you have bowel movements. Prescription medicines may be prescribed if over-the-counter medicines do not work.    °HOME CARE INSTRUCTIONS  °· Eat foods that have a lot of fiber, such as fruits, vegetables, whole grains, and beans. °· Limit foods high in fat and processed sugars, such as french fries, hamburgers, cookies, candies, and soda.   °· A fiber supplement may be added to your diet if you cannot get enough fiber from foods.   °· Drink enough fluids to keep your urine clear or pale yellow.   °· Exercise regularly or as directed by your health care provider.   °· Go to the restroom when you have the urge to go. Do not hold it.   °· Only take over-the-counter or prescription medicines as directed by your health care provider. Do not take other medicines for constipation without talking to your health care provider first.   °SEEK IMMEDIATE MEDICAL CARE IF:  °· You have bright red blood in your stool.   °· Your constipation lasts for more than 4 days or gets worse.   °· You have abdominal or rectal pain.   °· You have thin, pencil-like stools.   °· You have unexplained weight loss. °MAKE SURE YOU:  °· Understand these instructions. °· Will watch your condition. °· Will get help right away if you are not doing well or get worse. °  °This information is not intended to replace advice given to you by your health care provider. Make sure you discuss any questions you have with your health care provider. °  °Document Released: 12/16/2003 Document Revised: 04/09/2014 Document Reviewed: 12/29/2012 °Elsevier Interactive Patient Education ©2016 Elsevier Inc. ° °High-Fiber Diet °Fiber, also called dietary fiber, is a type of carbohydrate found in fruits, vegetables, whole grains, and   beans. A high-fiber diet can have many health benefits. Your health care provider may recommend a high-fiber diet to help: °· Prevent constipation. Fiber can make your bowel movements more regular. °· Lower your cholesterol. °· Relieve hemorrhoids, uncomplicated diverticulosis, or irritable bowel syndrome. °· Prevent overeating as part of a weight-loss  plan. °· Prevent heart disease, type 2 diabetes, and certain cancers. °WHAT IS MY PLAN? °The recommended daily intake of fiber includes: °· 38 grams for men under age 50. °· 30 grams for men over age 50. °· 25 grams for women under age 50. °· 21 grams for women over age 50. °You can get the recommended daily intake of dietary fiber by eating a variety of fruits, vegetables, grains, and beans. Your health care provider may also recommend a fiber supplement if it is not possible to get enough fiber through your diet. °WHAT DO I NEED TO KNOW ABOUT A HIGH-FIBER DIET? °· Fiber supplements have not been widely studied for their effectiveness, so it is better to get fiber through food sources. °· Always check the fiber content on the nutrition facts label of any prepackaged food. Look for foods that contain at least 5 grams of fiber per serving. °· Ask your dietitian if you have questions about specific foods that are related to your condition, especially if those foods are not listed in the following section. °· Increase your daily fiber consumption gradually. Increasing your intake of dietary fiber too quickly may cause bloating, cramping, or gas. °· Drink plenty of water. Water helps you to digest fiber. °WHAT FOODS CAN I EAT? °Grains °Whole-grain breads. Multigrain cereal. Oats and oatmeal. Brown rice. Barley. Bulgur wheat. Millet. Bran muffins. Popcorn. Rye wafer crackers. °Vegetables °Sweet potatoes. Spinach. Kale. Artichokes. Cabbage. Broccoli. Green peas. Carrots. Squash. °Fruits °Berries. Pears. Apples. Oranges. Avocados. Prunes and raisins. Dried figs. °Meats and Other Protein Sources °[place]y, kidney, pinto, and soy beans. Split peas. Lentils. Nuts and seeds. °Dairy °Fiber-fortified yogurt. °Beverages °Fiber-fortified soy milk. Fiber-fortified orange juice. °Other °Fiber bars. °The items listed above may not be a complete list of recommended foods or beverages. Contact your dietitian for more options. °WHAT FOODS  ARE NOT RECOMMENDED? °Grains °White bread. Pasta made with refined flour. White rice. °Vegetables °Fried potatoes. Canned vegetables. Well-cooked vegetables.  °Fruits °Fruit juice. Cooked, strained fruit. °Meats and Other Protein Sources °Fatty cuts of meat. Fried poultry or fried fish. °Dairy °Milk. Yogurt. Cream cheese. Sour cream. °Beverages °Soft drinks. °Other °Cakes and pastries. Butter and oils. °The items listed above may not be a complete list of foods and beverages to avoid. Contact your dietitian for more information. °WHAT ARE SOME TIPS FOR INCLUDING HIGH-FIBER FOODS IN MY DIET? °· Eat a wide variety of high-fiber foods. °· Make sure that half of all grains consumed each day are whole grains. °· Replace breads and cereals made from refined flour or white flour with whole-grain breads and cereals. °· Replace white rice with brown rice, bulgur wheat, or millet. °· Start the day with a breakfast that is high in fiber, such as a cereal that contains at least 5 grams of fiber per serving. °· Use beans in place of meat in soups, salads, or pasta. °· Eat high-fiber snacks, such as berries, raw vegetables, nuts, or popcorn. °  °This information is not intended to replace advice given to you by your health care provider. Make sure you discuss any questions you have with your health care provider. °  °Document Released: 03/19/2005 Document Revised: 04/09/2014 Document   Reviewed: 09/01/2013 °Elsevier Interactive Patient Education ©2016 Elsevier Inc. ° °

## 2015-07-22 MED FILL — GOLYTELY SOLUTION: 236 | 14 days supply | Qty: 4000 | Fill #0

## 2015-07-26 ENCOUNTER — Telehealth: Payer: Self-pay

## 2015-07-26 NOTE — Telephone Encounter (Signed)
-----   Message from Lance Bosch, NP sent at 07/15/2015 12:12 AM EDT ----- Cholesterol is elevated. Please go over things that increase cholesterol levels such as breads pasta, rice, butters, fried foods, etc. Please send Lipitor 20 mg to take every evening with dinner. Please explain that high cholesterol places her at risk for stroke and heart disease Thyroid test is abnormal. She should have a repeat TSH and Free T4 lab test in 3-6 months just to follow up to make sure it is back to normal.

## 2015-07-26 NOTE — Telephone Encounter (Signed)
Called [place] interpreter spoke with Denyse Amass [phone]. Interpreter called patient twice. Patient was left a message to return my call.   Note to patient:   ----- Message from Lance Bosch, NP sent at 07/15/2015 12:12 AM EDT -----  Cholesterol is elevated. Please go over things that increase cholesterol levels such as breads pasta, rice, butters, fried foods, etc. Please send Lipitor 20 mg to take every evening with dinner. Please explain that high cholesterol places her at risk for stroke and heart disease  Thyroid test is abnormal. She should have a repeat TSH and Free T4 lab test in 3-6 months just to follow up to make sure it is back to normal.

## 2015-07-27 NOTE — Telephone Encounter (Signed)
Patient states she already received her results.

## 2015-08-03 MED ORDER — POLYETHYLENE GLYCOL 3350 17 GM/SCOOP PO POWD: 17.0000 g | Freq: Every day | ORAL | Status: DC

## 2015-08-03 NOTE — Telephone Encounter (Signed)
Spoke with [employer] [phone]. Interperter placed the call to patient and a VM was left for patient to call back. MA spoke with [facility] pharmacy about patient Rx of Lipitor and patient has picked up her meds on 07/19/2015.   Patient request for refill of Miralax will be sent over to the pharmacy.  Note to patient: Please let patient know that her lipitor and miralax was sent over to her pharmacy.

## 2015-08-05 ENCOUNTER — Encounter: Payer: Self-pay | Admitting: Family Medicine

## 2015-08-05 ENCOUNTER — Ambulatory Visit: Payer: Self-pay | Attending: Family Medicine | Admitting: Family Medicine

## 2015-08-05 VITALS — BP 118/78 | HR 73 | Temp 98.8°F | Resp 16 | Ht 61.0 in | Wt 162.0 lb

## 2015-08-05 DIAGNOSIS — E039 Hypothyroidism, unspecified: Secondary | ICD-10-CM

## 2015-08-05 DIAGNOSIS — Z1231 Encounter for screening mammogram for malignant neoplasm of breast: Secondary | ICD-10-CM

## 2015-08-05 DIAGNOSIS — E038 Other specified hypothyroidism: Secondary | ICD-10-CM

## 2015-08-05 DIAGNOSIS — R946 Abnormal results of thyroid function studies: Secondary | ICD-10-CM

## 2015-08-05 DIAGNOSIS — Z0000 Encounter for general adult medical examination without abnormal findings: Secondary | ICD-10-CM

## 2015-08-05 DIAGNOSIS — K5900 Constipation, unspecified: Secondary | ICD-10-CM | POA: Insufficient documentation

## 2015-08-05 DIAGNOSIS — Z79899 Other long term (current) drug therapy: Secondary | ICD-10-CM | POA: Insufficient documentation

## 2015-08-05 DIAGNOSIS — Z114 Encounter for screening for human immunodeficiency virus [HIV]: Secondary | ICD-10-CM

## 2015-08-05 DIAGNOSIS — Z1159 Encounter for screening for other viral diseases: Secondary | ICD-10-CM

## 2015-08-05 DIAGNOSIS — E785 Hyperlipidemia, unspecified: Secondary | ICD-10-CM

## 2015-08-05 DIAGNOSIS — K5909 Other constipation: Secondary | ICD-10-CM | POA: Insufficient documentation

## 2015-08-05 DIAGNOSIS — R7989 Other specified abnormal findings of blood chemistry: Secondary | ICD-10-CM | POA: Insufficient documentation

## 2015-08-05 MED ORDER — ATORVASTATIN CALCIUM 20 MG PO TABS: 20.0000 mg | ORAL_TABLET | Freq: Every day | ORAL | Status: DC

## 2015-08-05 MED ORDER — PEG 3350-KCL-NA BICARB-NACL 420 G PO SOLR: 240.0000 mL | ORAL | Status: DC | PRN

## 2015-08-05 NOTE — Assessment & Plan Note (Signed)
Chronic constipation with elevated TSH  Check TSH and TFTs, possible hypothyroidism causing constipation Refilled Golytely GI referral for screening colonoscopy

## 2015-08-05 NOTE — Progress Notes (Signed)
   Subjective:  Patient ID: Alexandra Brewer, female    DOB: Aug 01, 1955  Age: 60 y.o. MRN: [id number]  Spanish interpreter used   CC: Constipation   HPI Alexandra Brewer has HLD she presents for   1. Constipation: dealing with this for 5 years. She has never had a colonoscopy. miralax dose not help with constipation. golytely helps. Has hard, dry stool. No fever, chills, weight loss, abdominal pain or blood in stool. Her TSH was elevated on her last blood draw.   2. Amenable to all health maintenance.   Social History  Substance Use Topics  . Smoking status: Never Smoker   . Smokeless tobacco: Not on file  . Alcohol Use: No    Outpatient Prescriptions Prior to Visit  Medication Sig Dispense Refill  . atorvastatin (LIPITOR) 20 MG tablet Take 1 tablet (20 mg total) by mouth daily. Take every evening at dinner. 30 tablet 2  . polyethylene glycol powder (GLYCOLAX/MIRALAX) powder Take 17 g by mouth daily. 850 g 3  . polyethylene glycol-electrolytes (NULYTELY/GOLYTELY) 420 g solution Drink 1 glass of liquid every 30 minutes times 4 glasses. If no results in 4 hours repeat. 2000 mL 0   No facility-administered medications prior to visit.    ROS Review of Systems  Constitutional: Negative for fever and chills.  Eyes: Negative for visual disturbance.  Respiratory: Negative for shortness of breath.   Cardiovascular: Negative for chest pain.  Gastrointestinal: Positive for constipation. Negative for nausea, vomiting, abdominal pain, diarrhea, blood in stool and rectal pain.  Musculoskeletal: Negative for back pain and arthralgias.  Skin: Negative for rash.  Allergic/Immunologic: Negative for immunocompromised state.  Hematological: Negative for adenopathy. Does not bruise/bleed easily.  Psychiatric/Behavioral: Negative for suicidal ideas and dysphoric mood.   Objective:  BP 118/78 mmHg  Pulse 73  Temp(Src) 98.8 F (37.1 C) (Oral)  Resp 16  Ht 5\' 1"  (1.549 m)  Wt 162 lb  (73.483 kg)  BMI 30.63 kg/m2  SpO2 99%  BP/Weight 08/05/2015 07/06/2015 [id number]  Systolic BP 123456 [id number] [id number]  Diastolic BP 78 88 79  Wt. (Lbs) 162 159.6 155.4  BMI 30.63 30.17 -   Physical Exam  Constitutional: She is oriented to person, place, and time. She appears well-developed and well-nourished. No distress.  HENT:  Head: Normocephalic and atraumatic.  Cardiovascular: Normal rate, regular rhythm, normal heart sounds and intact distal pulses.   Pulmonary/Chest: Effort normal and breath sounds normal.  Abdominal: Soft. Bowel sounds are normal. She exhibits no distension and no mass. There is no tenderness. There is no rebound and no guarding.  Musculoskeletal: She exhibits no edema.  Neurological: She is alert and oriented to person, place, and time.  Skin: Skin is warm and dry. No rash noted.  Psychiatric: She has a normal mood and affect.   Assessment & Plan:   There are no diagnoses linked to this encounter.  No orders of the defined types were placed in this encounter.    Follow-up: No Follow-up on file.   Boykin Nearing MD

## 2015-08-05 NOTE — Assessment & Plan Note (Signed)
Repeat TFT's

## 2015-08-05 NOTE — Patient Instructions (Addendum)
Shadaja was seen today for constipation.  Diagnoses and all orders for this visit:  Chronic constipation -     polyethylene glycol-electrolytes (NULYTELY/GOLYTELY) 420 g solution; Take 240 mLs by mouth every 3 (three) hours as needed (for constipation up to 4 doses. If no results in 4 hour repeat).  Healthcare maintenance -     Ambulatory referral to Gastroenterology  Elevated TSH -     TSH -     T3, free -     T4, free  Screening for HIV (human immunodeficiency virus) -     HIV antibody (with reflex)  Need for hepatitis C screening test -     Hepatitis C antibody, reflex  Visit for screening mammogram -     MM DIGITAL SCREENING BILATERAL; Future  Hyperlipidemia -     atorvastatin (LIPITOR) 20 MG tablet; Take 1 tablet (20 mg total) by mouth daily. Take every evening at dinner.   F/u in 3 months for constipation  Dr. Adrian Blackwater   Estreimiento - Adultos (Constipation, Adult) Estreimiento significa que una persona tiene menos de tres evacuaciones en una semana, dificultad para defecar, o que las heces son secas, duras, o ms grandes que lo normal. A medida que envejecemos el estreimiento es ms comn. Una dieta baja en fibra, no tomar suficientes lquidos y el uso de ciertos medicamentos pueden Agricultural engineer.  CAUSAS   Ciertos medicamentos, como los antidepresivos, analgsicos, suplementos de hierro, anticidos y diurticos.  Algunas enfermedades, como la diabetes, el sndrome del colon irritable, enfermedad de la tiroides, o depresin.  No beber suficiente agua.  No consumir suficientes alimentos ricos en fibra.  Situaciones de estrs o viajes.  Falta de actividad fsica o de ejercicio.  Ignorar la necesidad sbita de Landscape architect.  Uso en exceso de laxantes. SIGNOS Y SNTOMAS   Defecar menos de tres veces por semana.  Dificultad para defecar.  Tener las heces secas y duras, o ms grandes que las normales.  Sensacin de estar lleno o hinchado.  Dolor  en la parte baja del abdomen.  No sentir alivio despus de defecar. DIAGNSTICO  El mdico le har una historia clnica y un examen fsico. Pueden hacerle exmenes adicionales para el estreimiento grave. Estos estudios pueden ser:  Un radiografa con enema de bario para examinar el recto, el colon y, en algunos casos, el intestino delgado.  Una sigmoidoscopia para examinar el colon inferior.  Una colonoscopia para examinar todo el colon. TRATAMIENTO  El tratamiento depender de la gravedad del estreimiento y de la causa. Algunos tratamientos nutricionales son beber ms lquidos y comer ms alimentos ricos en fibra. El cambio en el estilo de vida incluye hacer ejercicios de [place] regular. Si estas recomendaciones para Animator dieta y en el estilo de vida no ayudan, el mdico le puede indicar el uso de laxantes de venta libre para ayudarlo a Landscape architect. Los medicamentos recetados se pueden prescribir si los medicamentos de venta libre no lo [place].  INSTRUCCIONES PARA EL CUIDADO EN EL HOGAR   Consuma alimentos con alto contenido de [place], como frutas, vegetales, cereales integrales y porotos.  Limite los alimentos procesados ricos en grasas y azcar, como las papas fritas, hamburguesas, galletas, dulces y refrescos.  Puede agregar un suplemento de fibra a su dieta si no obtiene lo suficiente de los alimentos.  Beba suficiente lquido para Consulting civil engineer orina clara o de color amarillo plido.  Haga ejercicio regularmente o segn las indicaciones del mdico.  Vaya al bao cuando  sienta la necesidad de ir. No se aguante las ganas.  Tome solo medicamentos de venta libre o recetados, segn las indicaciones del mdico. No tome otros medicamentos para el estreimiento sin consultarlo antes con su mdico. SOLICITE ATENCIN MDICA DE INMEDIATO SI:   Observa sangre brillante en las heces.  El estreimiento dura ms de 4 das o [place].  Siente dolor abdominal o rectal.  Las heces  son delgadas como un lpiz.  Pierde peso de [place] inexplicable. ASEGRESE DE QUE:   Comprende estas instrucciones.  Controlar su afeccin.  Recibir ayuda de inmediato si no mejora o si empeora.   Esta informacin no tiene Marine scientist el consejo del mdico. Asegrese de hacerle al mdico cualquier pregunta que tenga.   Document Released: 04/08/2007 Document Revised: 04/09/2014 Elsevier Interactive Patient Education [employer].

## 2015-08-05 NOTE — Progress Notes (Signed)
F/U constipation Pt stated abdominal area swelling after meals   Medication refills  Pain scale # 0 No tobacco user  No suicidal thoughts in the past two weeks Lab results given.  Stated has repeat labs.

## 2015-08-06 LAB — HEPATITIS C ANTIBODY: HCV Ab: NEGATIVE

## 2015-08-06 LAB — T4, FREE: FREE T4: 0.8 ng/dL (ref 0.8–1.8)

## 2015-08-06 LAB — T3, FREE: T3, Free: 2.7 pg/mL (ref 2.3–4.2)

## 2015-08-06 LAB — HIV ANTIBODY (ROUTINE TESTING W REFLEX): HIV 1&2 Ab, 4th Generation: NONREACTIVE

## 2015-08-06 LAB — TSH: TSH: 7.56 mIU/L — ABNORMAL HIGH

## 2015-08-10 DIAGNOSIS — E039 Hypothyroidism, unspecified: Secondary | ICD-10-CM | POA: Insufficient documentation

## 2015-08-10 MED ORDER — LEVOTHYROXINE SODIUM 50 MCG PO TABS: 50.0000 ug | ORAL_TABLET | Freq: Every day | ORAL | Status: DC

## 2015-08-10 NOTE — Addendum Note (Signed)
Addended by: Boykin Nearing on: 08/10/2015 10:25 AM   Modules accepted: Orders

## 2015-08-15 ENCOUNTER — Telehealth: Payer: Self-pay | Admitting: *Deleted

## 2015-08-15 NOTE — Telephone Encounter (Signed)
LVM to return call.

## 2015-08-15 NOTE — Telephone Encounter (Signed)
Pt. Returned call. Pt. Said it's OK to LVM. Please f/u.

## 2015-08-15 NOTE — Telephone Encounter (Signed)
-----   Message from Boykin Nearing, MD sent at 08/10/2015 10:23 AM EDT ----- Screening HIV and Hep C negative TSH high, T3 and T4 normal  Patient meets criteria for subclinical hypothyroidism she has symptoms of constipation, weight gain,  I recommend low dose synthroid to help with chronic constipation, weight loss

## 2015-08-25 ENCOUNTER — Telehealth: Payer: Self-pay

## 2015-08-25 NOTE — Telephone Encounter (Signed)
PATIENT ALREADY IN EPIC AS NEEDING INTERPRETER

## 2015-08-25 NOTE — Telephone Encounter (Signed)
Pt was referred for a colonoscopy. She had an interpreter call for her, Rosemarie Ax, that works with her. Her phone number is [phone].  Pt does have problems with constipation and is scheduled for an OV with Walden Field, NP on 09/05/2015 at 2:00 PM. Rosemarie Ax is aware for pt to arrive 15 min early for appt. She has cone assistance. Rosemarie Ax said for [place] to get an interpreter for pt that day. But if we could not get one, that we could give her a call and she will come with her. I am routing to [place] to set up the interpreter.

## 2015-09-05 ENCOUNTER — Ambulatory Visit: Payer: Self-pay | Admitting: Nurse Practitioner

## 2015-09-06 MED FILL — ?ATORVASTATIN 20 MG TABLET: 20 | 30 days supply | Qty: 30 | Fill #1

## 2015-09-12 ENCOUNTER — Ambulatory Visit: Payer: Self-pay | Attending: Family Medicine

## 2015-09-12 MED FILL — GOLYTELY SOLUTION: 236 | 1 days supply | Qty: 4000 | Fill #0

## 2015-09-12 MED FILL — ?LEVOTHYROXINE 50 MCG TABLE: 50 | 30 days supply | Qty: 30 | Fill #0

## 2015-09-27 ENCOUNTER — Ambulatory Visit (INDEPENDENT_AMBULATORY_CARE_PROVIDER_SITE_OTHER): Payer: Self-pay | Admitting: Nurse Practitioner

## 2015-09-27 ENCOUNTER — Encounter: Payer: Self-pay | Admitting: Nurse Practitioner

## 2015-09-27 ENCOUNTER — Other Ambulatory Visit: Payer: Self-pay

## 2015-09-27 VITALS — BP 125/76 | HR 63 | Temp 98.1°F | Wt 160.0 lb

## 2015-09-27 DIAGNOSIS — Z1211 Encounter for screening for malignant neoplasm of colon: Secondary | ICD-10-CM | POA: Insufficient documentation

## 2015-09-27 DIAGNOSIS — K5900 Constipation, unspecified: Secondary | ICD-10-CM

## 2015-09-27 DIAGNOSIS — K5909 Other constipation: Secondary | ICD-10-CM

## 2015-09-27 NOTE — Progress Notes (Signed)
CC'ED TO PCP 

## 2015-09-27 NOTE — Progress Notes (Signed)
Primary Care Physician:  Minerva Ends, MD Primary Gastroenterologist:  Dr. Gala Romney  Chief Complaint  Patient presents with  . Constipation    Referred for colonoscopy    HPI:   Alexandra Brewer is a 60 y.o. female who presents On referral from primary care for constipation and to schedule first ever colonoscopy.  PCP notes reviewed, last saw PCP 08/05/2015. At that time she was being followed up for constipation noted chronic for 5 years, never had a colonoscopy before. MiraLAX not helping, GoLYTELY helps. Noted hard dry stools. Denied fever, chills, weight loss, abdominal pain, hematochezia. TSH elevated with her last lab draw (7.56 on 08/05/2015). She was started on a low-dose Synthroid 50 g for subclinical hypothyroidism given symptomatic presentation.  Today her visit is assisted by a Publishing copy. She is having constipation and straining. Has had constipation for several years, worsening in the past year. Minimal abdominal pain. Denies hematochezia, melena, N/V, fever, chills, unintentional weight loss. Her main problem is asthma but has not had any asthma attacks in about 2 years. Miralax has not helped. Currently takes GoLytely as needed for constipation and with that goes daily on GoLytely and are softer on this. Has tried other natural remedies without relief. Is also having bloating when constipated. Denies chest pain, dyspnea, dizziness, lightheadedness, syncope, near syncope. Denies any other upper or lower GI symptoms.  Past Medical History  Diagnosis Date  . Constipation   . Asthma     Past Surgical History  Procedure Laterality Date  . Cataract extraction      Current Outpatient Prescriptions  Medication Sig Dispense Refill  . atorvastatin (LIPITOR) 20 MG tablet Take 1 tablet (20 mg total) by mouth daily. Take every evening at dinner. 30 tablet 11  . levothyroxine (SYNTHROID, LEVOTHROID) 50 MCG tablet Take 1 tablet (50 mcg total) by mouth daily. 30 tablet  2  . NON FORMULARY OTC allergy pill    . polyethylene glycol-electrolytes (NULYTELY/GOLYTELY) 420 g solution Take 240 mLs by mouth every 3 (three) hours as needed (for constipation up to 4 doses. If no results in 4 hour repeat). (Patient taking differently: Take 240 mLs by mouth every 3 (three) hours as needed (for constipation up to 4 doses. If no results in 4 hour repeat). Takes a dose when she needs it) 4000 mL 0   No current facility-administered medications for this visit.    Allergies as of 09/27/2015 - Review Complete 09/27/2015  Allergen Reaction Noted  . Penicillins  07/20/2015    Family History  Problem Relation Age of Onset  . Colon cancer Neg Hx     Social History   Social History  . Marital Status: Single    Spouse Name: N/A  . Number of Children: N/A  . Years of Education: N/A   Occupational History  . Not on file.   Social History Main Topics  . Smoking status: Never Smoker   . Smokeless tobacco: Never Used  . Alcohol Use: No  . Drug Use: No  . Sexual Activity: Not on file   Other Topics Concern  . Not on file   Social History Narrative    Review of Systems: General: Negative for anorexia, weight loss, fever, chills, fatigue, weakness. ENT: Negative for hoarseness, difficulty swallowing. CV: Negative for chest pain, angina, palpitations, peripheral edema.  Respiratory: Negative for dyspnea at rest, cough, sputum, wheezing.  GI: See history of present illness. MS: Negative for joint pain, low back pain.  Derm: Negative  for rash or itching.  Endo: Negative for unusual weight change.  Heme: Negative for bruising or bleeding. Allergy: Negative for rash or hives.    Physical Exam: BP 125/76 mmHg  Pulse 63  Temp(Src) 98.1 F (36.7 C) (Oral)  Wt 160 lb (72.576 kg) General:   Alert and oriented. Pleasant and cooperative. Well-nourished and well-developed.  Head:  Normocephalic and atraumatic. Eyes:  Without icterus, sclera clear and conjunctiva  pink.  Ears:  Normal auditory acuity. Cardiovascular:  S1, S2 present without murmurs appreciated. Extremities without clubbing or edema. Respiratory:  Clear to auscultation bilaterally. No wheezes, rales, or rhonchi. No distress.  Gastrointestinal:  +BS, rounded but soft, non-tender and non-distended. No HSM noted. No guarding or rebound. No masses appreciated.  Rectal:  Deferred  Musculoskalatal:  Symmetrical without gross deformities. Neurologic:  Alert and oriented x4;  grossly normal neurologically. Psych:  Alert and cooperative. Normal mood and affect. Heme/Lymph/Immune: No excessive bruising noted.    09/27/2015 2:42 PM   Disclaimer: This note was dictated with voice recognition software. Similar sounding words can inadvertently be transcribed and may not be corrected upon review.

## 2015-09-27 NOTE — Patient Instructions (Addendum)
English: 1. We will schedule your procedure for you. 2. Take Linzess 145 g once a day for constipation. Take this on an empty stomach, before eating. 3. Call us in 2 weeks and tell us of the medication is helping. 4. Return for follow-up in 3 months  Espanol: 1. Programaremos su procedimiento para usted. 2. Tome Linzess 145 ?g una vez al da para el estreimiento. Tomar esto con el estmago vaco, antes de comer. 3. Llmenos en 2 semanas y avsenos de que la medicacin est [place]. 4. Retorno para el seguimiento en 3 meses   NOTE: translation was reviewed by official system translator who agreed with accurate translation.

## 2015-09-27 NOTE — Assessment & Plan Note (Signed)
Patient with a history of chronic constipation. Has been taking GoLYTELY for her constipation over have a bowel movement. At this time she is due for colonoscopy. I will have her stop GoLYTELY currently, start Linzess 145 g once a day. Bowel prep for colonoscopy. Return for follow-up in 3 months. She is to call us with a two-week progress report on the Linzess. We can dose adjust or change medications based on her response.  Proceed with TCS with Dr. Gala Romney in near future: the risks, benefits, and alternatives have been discussed with the patient in detail. The patient states understanding and desires to proceed.  The patient is not on any anticoagulants, anxiolytics, chronic pain medications, or antidepressants. Denies alcohol and drug use. Conscious sedation should be adequate for her procedure.

## 2015-09-27 NOTE — Assessment & Plan Note (Signed)
Currently due for first-ever screening colonoscopy. She is having constipation. I'm going to start her on Linzess for constipation which should help prep her bowels for colonoscopy with the standard bowel prep. Colonoscopy as noted above. Return for follow-up in 3 months.

## 2015-10-10 ENCOUNTER — Encounter (HOSPITAL_COMMUNITY): Payer: Self-pay

## 2015-10-10 ENCOUNTER — Encounter (HOSPITAL_COMMUNITY)
Admission: RE | Disposition: A | Discharge status: Home / Self Care | Payer: Self-pay | Source: Ambulatory Visit | Attending: Internal Medicine

## 2015-10-10 ENCOUNTER — Ambulatory Visit (HOSPITAL_COMMUNITY)
Admission: RE | Admit: 2015-10-10 | Discharge: 2015-10-10 | Disposition: A | Discharge status: Home / Self Care | Payer: Self-pay | Source: Ambulatory Visit | Attending: Internal Medicine | Admitting: Internal Medicine

## 2015-10-10 DIAGNOSIS — K5730 Diverticulosis of large intestine without perforation or abscess without bleeding: Secondary | ICD-10-CM | POA: Insufficient documentation

## 2015-10-10 DIAGNOSIS — K5900 Constipation, unspecified: Secondary | ICD-10-CM | POA: Insufficient documentation

## 2015-10-10 DIAGNOSIS — Z1211 Encounter for screening for malignant neoplasm of colon: Secondary | ICD-10-CM | POA: Insufficient documentation

## 2015-10-10 DIAGNOSIS — K640 First degree hemorrhoids: Secondary | ICD-10-CM | POA: Insufficient documentation

## 2015-10-10 DIAGNOSIS — Z86010 Personal history of colonic polyps: Secondary | ICD-10-CM | POA: Insufficient documentation

## 2015-10-10 DIAGNOSIS — K635 Polyp of colon: Secondary | ICD-10-CM

## 2015-10-10 DIAGNOSIS — D120 Benign neoplasm of cecum: Secondary | ICD-10-CM | POA: Insufficient documentation

## 2015-10-10 HISTORY — PX: COLONOSCOPY: SHX5424

## 2015-10-10 HISTORY — DX: Hyperlipidemia, unspecified: E78.5

## 2015-10-10 SURGERY — COLONOSCOPY
Anesthesia: Moderate Sedation

## 2015-10-10 MED ORDER — ONDANSETRON HCL 4 MG/2ML IJ SOLN
INTRAMUSCULAR | Status: AC
  Filled 2015-10-10: qty 2

## 2015-10-10 MED ORDER — SODIUM CHLORIDE 0.9 % IV SOLN
INTRAVENOUS | Status: DC
  Administered 2015-10-10: 12:00:00 via INTRAVENOUS

## 2015-10-10 MED ORDER — MIDAZOLAM HCL 5 MG/5ML IJ SOLN
INTRAMUSCULAR | Status: DC | PRN
  Administered 2015-10-10 (×2): 2 mg via INTRAVENOUS
  Administered 2015-10-10: 1 mg via INTRAVENOUS

## 2015-10-10 MED ORDER — ONDANSETRON HCL 4 MG/2ML IJ SOLN
INTRAMUSCULAR | Status: DC | PRN
  Administered 2015-10-10: 4 mg via INTRAVENOUS

## 2015-10-10 MED ORDER — MEPERIDINE HCL 100 MG/ML IJ SOLN
INTRAMUSCULAR | Status: AC
  Filled 2015-10-10: qty 2

## 2015-10-10 MED ORDER — MIDAZOLAM HCL 5 MG/5ML IJ SOLN
INTRAMUSCULAR | Status: AC
  Filled 2015-10-10: qty 10

## 2015-10-10 MED ORDER — STERILE WATER FOR IRRIGATION IR SOLN
Status: DC | PRN
  Administered 2015-10-10: 2.5 mL

## 2015-10-10 MED ORDER — MEPERIDINE HCL 100 MG/ML IJ SOLN
INTRAMUSCULAR | Status: DC | PRN
  Administered 2015-10-10: 50 mg via INTRAVENOUS
  Administered 2015-10-10: 25 mg via INTRAVENOUS
  Administered 2015-10-10: 50 mg via INTRAVENOUS

## 2015-10-10 NOTE — H&P (View-Only) (Signed)
Primary Care Physician:  Minerva Ends, MD Primary Gastroenterologist:  Dr. Gala Romney  Chief Complaint  Patient presents with  . Constipation    Referred for colonoscopy    HPI:   Alexandra Brewer is a 60 y.o. female who presents On referral from primary care for constipation and to schedule first ever colonoscopy.  PCP notes reviewed, last saw PCP 08/05/2015. At that time she was being followed up for constipation noted chronic for 5 years, never had a colonoscopy before. MiraLAX not helping, GoLYTELY helps. Noted hard dry stools. Denied fever, chills, weight loss, abdominal pain, hematochezia. TSH elevated with her last lab draw (7.56 on 08/05/2015). She was started on a low-dose Synthroid 50 g for subclinical hypothyroidism given symptomatic presentation.  Today her visit is assisted by a Publishing copy. She is having constipation and straining. Has had constipation for several years, worsening in the past year. Minimal abdominal pain. Denies hematochezia, melena, N/V, fever, chills, unintentional weight loss. Her main problem is asthma but has not had any asthma attacks in about 2 years. Miralax has not helped. Currently takes GoLytely as needed for constipation and with that goes daily on GoLytely and are softer on this. Has tried other natural remedies without relief. Is also having bloating when constipated. Denies chest pain, dyspnea, dizziness, lightheadedness, syncope, near syncope. Denies any other upper or lower GI symptoms.  Past Medical History  Diagnosis Date  . Constipation   . Asthma     Past Surgical History  Procedure Laterality Date  . Cataract extraction      Current Outpatient Prescriptions  Medication Sig Dispense Refill  . atorvastatin (LIPITOR) 20 MG tablet Take 1 tablet (20 mg total) by mouth daily. Take every evening at dinner. 30 tablet 11  . levothyroxine (SYNTHROID, LEVOTHROID) 50 MCG tablet Take 1 tablet (50 mcg total) by mouth daily. 30 tablet  2  . NON FORMULARY OTC allergy pill    . polyethylene glycol-electrolytes (NULYTELY/GOLYTELY) 420 g solution Take 240 mLs by mouth every 3 (three) hours as needed (for constipation up to 4 doses. If no results in 4 hour repeat). (Patient taking differently: Take 240 mLs by mouth every 3 (three) hours as needed (for constipation up to 4 doses. If no results in 4 hour repeat). Takes a dose when she needs it) 4000 mL 0   No current facility-administered medications for this visit.    Allergies as of 09/27/2015 - Review Complete 09/27/2015  Allergen Reaction Noted  . Penicillins  07/20/2015    Family History  Problem Relation Age of Onset  . Colon cancer Neg Hx     Social History   Social History  . Marital Status: Single    Spouse Name: N/A  . Number of Children: N/A  . Years of Education: N/A   Occupational History  . Not on file.   Social History Main Topics  . Smoking status: Never Smoker   . Smokeless tobacco: Never Used  . Alcohol Use: No  . Drug Use: No  . Sexual Activity: Not on file   Other Topics Concern  . Not on file   Social History Narrative    Review of Systems: General: Negative for anorexia, weight loss, fever, chills, fatigue, weakness. ENT: Negative for hoarseness, difficulty swallowing. CV: Negative for chest pain, angina, palpitations, peripheral edema.  Respiratory: Negative for dyspnea at rest, cough, sputum, wheezing.  GI: See history of present illness. MS: Negative for joint pain, low back pain.  Derm: Negative  for rash or itching.  Endo: Negative for unusual weight change.  Heme: Negative for bruising or bleeding. Allergy: Negative for rash or hives.    Physical Exam: BP 125/76 mmHg  Pulse 63  Temp(Src) 98.1 F (36.7 C) (Oral)  Wt 160 lb (72.576 kg) General:   Alert and oriented. Pleasant and cooperative. Well-nourished and well-developed.  Head:  Normocephalic and atraumatic. Eyes:  Without icterus, sclera clear and conjunctiva  pink.  Ears:  Normal auditory acuity. Cardiovascular:  S1, S2 present without murmurs appreciated. Extremities without clubbing or edema. Respiratory:  Clear to auscultation bilaterally. No wheezes, rales, or rhonchi. No distress.  Gastrointestinal:  +BS, rounded but soft, non-tender and non-distended. No HSM noted. No guarding or rebound. No masses appreciated.  Rectal:  Deferred  Musculoskalatal:  Symmetrical without gross deformities. Neurologic:  Alert and oriented x4;  grossly normal neurologically. Psych:  Alert and cooperative. Normal mood and affect. Heme/Lymph/Immune: No excessive bruising noted.    09/27/2015 2:42 PM   Disclaimer: This note was dictated with voice recognition software. Similar sounding words can inadvertently be transcribed and may not be corrected upon review.

## 2015-10-10 NOTE — Discharge Instructions (Addendum)
Colonoscopy Discharge Instructions  Read the instructions outlined below and refer to this sheet in the next few weeks. These discharge instructions provide you with general information on caring for yourself after you leave the hospital. Your doctor may also give you specific instructions. While your treatment has been planned according to the most current medical practices available, unavoidable complications occasionally occur. If you have any problems or questions after discharge, call Dr. Gala Romney at [phone]. ACTIVITY  You may resume your regular activity, but move at a slower pace for the next 24 hours.   Take frequent rest periods for the next 24 hours.   Walking will help get rid of the air and reduce the bloated feeling in your belly (abdomen).   No driving for 24 hours (because of the medicine (anesthesia) used during the test).    Do not sign any important legal documents or operate any machinery for 24 hours (because of the anesthesia used during the test).  NUTRITION  Drink plenty of fluids.   You may resume your normal diet as instructed by your doctor.   Begin with a light meal and progress to your normal diet. Heavy or fried foods are harder to digest and may make you feel sick to your stomach (nauseated).   Avoid alcoholic beverages for 24 hours or as instructed.  MEDICATIONS  You may resume your normal medications unless your doctor tells you otherwise.  WHAT YOU CAN EXPECT TODAY  Some feelings of bloating in the abdomen.   Passage of more gas than usual.   Spotting of blood in your stool or on the toilet paper.  IF YOU HAD POLYPS REMOVED DURING THE COLONOSCOPY:  No aspirin products for 7 days or as instructed.   No alcohol for 7 days or as instructed.   Eat a soft diet for the next 24 hours.  FINDING OUT THE RESULTS OF YOUR TEST Not all test results are available during your visit. If your test results are not back during the visit, make an appointment  with your caregiver to find out the results. Do not assume everything is normal if you have not heard from your caregiver or the medical facility. It is important for you to follow up on all of your test results.  SEEK IMMEDIATE MEDICAL ATTENTION IF:  You have more than a spotting of blood in your stool.   Your belly is swollen (abdominal distention).   You are nauseated or vomiting.   You have a temperature over 101.   You have abdominal pain or discomfort that is severe or gets worse throughout the day.    Constipation, diverticulosis and colon polyp information provided  Continue Linzess 145 daily  Add Benefiber 1 tablespoon twice daily to her regimen.  Further recommendations to follow pending review of pathology report  Office visit with [place] in 6 weeks  Diverticulosis Diverticulosis is the condition that develops when small pouches (diverticula) form in the wall of your colon. Your colon, or large intestine, is where water is absorbed and stool is formed. The pouches form when the inside layer of your colon pushes through weak spots in the outer layers of your colon. CAUSES  No one knows exactly what causes diverticulosis. RISK FACTORS  Being older than 25. Your risk for this condition increases with age. Diverticulosis is rare in people younger than 40 years. By age 73, almost everyone has it.  Eating a low-fiber diet.  Being frequently constipated.  Being overweight.  Not getting enough  exercise.  Smoking.  Taking over-the-counter pain medicines, like aspirin and ibuprofen. SYMPTOMS  Most people with diverticulosis do not have symptoms. DIAGNOSIS  Because diverticulosis often has no symptoms, health care providers often discover the condition during an exam for other colon problems. In many cases, a health care provider will diagnose diverticulosis while using a flexible scope to examine the colon (colonoscopy). TREATMENT  If you have never developed an infection  related to diverticulosis, you may not need treatment. If you have had an infection before, treatment may include:  Eating more fruits, vegetables, and grains.  Taking a fiber supplement.  Taking a live bacteria supplement (probiotic).  Taking medicine to relax your colon. HOME CARE INSTRUCTIONS   Drink at least 6-8 glasses of water each day to prevent constipation.  Try not to strain when you have a bowel movement.  Keep all follow-up appointments. If you have had an infection before:  Increase the fiber in your diet as directed by your health care provider or dietitian.  Take a dietary fiber supplement if your health care provider approves.  Only take medicines as directed by your health care provider. SEEK MEDICAL CARE IF:   You have abdominal pain.  You have bloating.  You have cramps.  You have not gone to the bathroom in 3 days. SEEK IMMEDIATE MEDICAL CARE IF:   Your pain gets worse.  Yourbloating becomes very bad.  You have a fever or chills, and your symptoms suddenly get worse.  You begin vomiting.  You have bowel movements that are bloody or black. MAKE SURE YOU:  Understand these instructions.  Will watch your condition.  Will get help right away if you are not doing well or get worse.   This information is not intended to replace advice given to you by your health care provider. Make sure you discuss any questions you have with your health care provider.   Document Released: 12/15/2003 Document Revised: 03/24/2013 Document Reviewed: 02/11/2013 Elsevier Interactive Patient Education 2016 Elsevier Inc. Colon Polyps Polyps are lumps of extra tissue growing inside the body. Polyps can grow in the large intestine (colon). Most colon polyps are noncancerous (benign). However, some colon polyps can become cancerous over time. Polyps that are larger than a pea may be harmful. To be safe, caregivers remove and test all polyps. CAUSES  Polyps form when  mutations in the genes cause your cells to grow and divide even though no more tissue is needed. RISK FACTORS There are a number of risk factors that can increase your chances of getting colon polyps. They include:  Being older than 50 years.  Family history of colon polyps or colon cancer.  Long-term colon diseases, such as colitis or Crohn disease.  Being overweight.  Smoking.  Being inactive.  Drinking too much alcohol. SYMPTOMS  Most small polyps do not cause symptoms. If symptoms are present, they may include:  Blood in the stool. The stool may look dark red or black.  Constipation or diarrhea that lasts longer than 1 week. DIAGNOSIS People often do not know they have polyps until their caregiver finds them during a regular checkup. Your caregiver can use 4 tests to check for polyps:  Digital rectal exam. The caregiver wears gloves and feels inside the rectum. This test would find polyps only in the rectum.  Barium enema. The caregiver puts a liquid called barium into your rectum before taking X-rays of your colon. Barium makes your colon look white. Polyps are dark, so they  are easy to see in the X-ray pictures.  Sigmoidoscopy. A thin, flexible tube (sigmoidoscope) is placed into your rectum. The sigmoidoscope has a light and tiny camera in it. The caregiver uses the sigmoidoscope to look at the last third of your colon.  Colonoscopy. This test is like sigmoidoscopy, but the caregiver looks at the entire colon. This is the most common method for finding and removing polyps. TREATMENT  Any polyps will be removed during a sigmoidoscopy or colonoscopy. The polyps are then tested for cancer. PREVENTION  To help lower your risk of getting more colon polyps:  Eat plenty of fruits and vegetables. Avoid eating fatty foods.  Do not smoke.  Avoid drinking alcohol.  Exercise every day.  Lose weight if recommended by your caregiver.  Eat plenty of calcium and folate. Foods  that are rich in calcium include milk, cheese, and broccoli. Foods that are rich in folate include chickpeas, kidney beans, and spinach. HOME CARE INSTRUCTIONS Keep all follow-up appointments as directed by your caregiver. You may need periodic exams to check for polyps. SEEK MEDICAL CARE IF: You notice bleeding during a bowel movement.   This information is not intended to replace advice given to you by your health care provider. Make sure you discuss any questions you have with your health care provider.   Document Released: 12/14/2003 Document Revised: 04/09/2014 Document Reviewed: 05/29/2011 Elsevier Interactive Patient Education 2016 [employer]. Hemorrhoids Hemorrhoids are swollen veins around the rectum or anus. There are two types of hemorrhoids:   Internal hemorrhoids. These occur in the veins just inside the rectum. They may poke through to the outside and become irritated and painful.  External hemorrhoids. These occur in the veins outside the anus and can be felt as a painful swelling or hard lump near the anus. CAUSES  Pregnancy.   Obesity.   Constipation or diarrhea.   Straining to have a bowel movement.   Sitting for long periods on the toilet.  Heavy lifting or other activity that caused you to strain.  Anal intercourse. SYMPTOMS   Pain.   Anal itching or irritation.   Rectal bleeding.   Fecal leakage.   Anal swelling.   One or more lumps around the anus.  DIAGNOSIS  Your caregiver may be able to diagnose hemorrhoids by visual examination. Other examinations or tests that may be performed include:   Examination of the rectal area with a gloved hand (digital rectal exam).   Examination of anal canal using a small tube (scope).   A blood test if you have lost a significant amount of blood.  A test to look inside the colon (sigmoidoscopy or colonoscopy). TREATMENT Most hemorrhoids can be treated at home. However, if symptoms do not  seem to be getting better or if you have a lot of rectal bleeding, your caregiver may perform a procedure to help make the hemorrhoids get smaller or remove them completely. Possible treatments include:   Placing a rubber band at the base of the hemorrhoid to cut off the circulation (rubber band ligation).   Injecting a chemical to shrink the hemorrhoid (sclerotherapy).   Using a tool to burn the hemorrhoid (infrared light therapy).   Surgically removing the hemorrhoid (hemorrhoidectomy).   Stapling the hemorrhoid to block blood flow to the tissue (hemorrhoid stapling).  HOME CARE INSTRUCTIONS   Eat foods with fiber, such as whole grains, beans, nuts, fruits, and vegetables. Ask your doctor about taking products with added fiber in them (fibersupplements).  Increase  fluid intake. Drink enough water and fluids to keep your urine clear or pale yellow.   Exercise regularly.   Go to the bathroom when you have the urge to have a bowel movement. Do not wait.   Avoid straining to have bowel movements.   Keep the anal area dry and clean. Use wet toilet paper or moist towelettes after a bowel movement.   Medicated creams and suppositories may be used or applied as directed.   Only take over-the-counter or prescription medicines as directed by your caregiver.   Take warm sitz baths for 15-20 minutes, 3-4 times a day to ease pain and discomfort.   Place ice packs on the hemorrhoids if they are tender and swollen. Using ice packs between sitz baths may be helpful.   Put ice in a plastic bag.   Place a towel between your skin and the bag.   Leave the ice on for 15-20 minutes, 3-4 times a day.   Do not use a donut-shaped pillow or sit on the toilet for long periods. This increases blood pooling and pain.  SEEK MEDICAL CARE IF:  You have increasing pain and swelling that is not controlled by treatment or medicine.  You have uncontrolled bleeding.  You have difficulty  or you are unable to have a bowel movement.  You have pain or inflammation outside the area of the hemorrhoids. MAKE SURE YOU:  Understand these instructions.  Will watch your condition.  Will get help right away if you are not doing well or get worse.   This information is not intended to replace advice given to you by your health care provider. Make sure you discuss any questions you have with your health care provider.    Plipos en el colon  (Colon Polyps) Los plipos son masas de tejido que crecen dentro del cuerpo. Los plipos pueden desarrollarse en el intestino grueso (colon). La mayora de los plipos son no cancerosos (benignos). Sin embargo, algunos plipos pueden convertirse en cancerosos con el tiempo. Los plipos que sean ms grandes que un guisante pueden ser [employer]. Para estar seguros, los mdicos extirpan y [employer] plipos.  CAUSAS  Se forman cuando ciertas mutaciones genticas hacen que las clulas se desarrollen y se dividan por dems.  FACTORES DE RIESGO  Hay un nmero de factores de riesgo que pueden aumentar las probabilidades de padecer plipos en el colon. Ellos son:   Ser mayor de 48 aos de edad.  Historia familiar de cncer o plipos de colon.  Ciertas enfermedades crnicas como la colitis o la enfermedad de Crohn.  Tener sobrepeso.  El hbito de fumar.  El sedentarismo.  Beber alcohol en exceso. SNTOMAS  La mayor parte de los plipos no causa sntomas. Si se presentan sntomas, stos pueden ser:   [employer] materia fecal. Heces de color rojo oscuro o negro.  Constipacin o diarrea que duran ms de 1 semana. DIAGNSTICO  [place] persona con frecuencia no sabe que tiene plipos [employer] su mdico los halla durante un examen fsico de [place]. El mdico puede usar 4 tipos de pruebas para Hydrographic surveyor plipos:   Examen Musician. Se colocar guantes y palpar el interior del recto. Esta prueba detectar solo plipos en el  recto.  Enema de bario. El mdico introduce un lquido llamado bario en el recto y luego toma radiografas del colon. El bario hace que el colon se vea blanco. Los plipos son de color oscuro, por lo que son fciles de  ver en las radiografas.  Sigmoideoscopia. Se coloca un tubo delgado y flexible (sigmoideoscopio) en el recto. Este sigmoideoscopio tiene [place] fuente de luz y Ardelia Mems pequea cmara de video. El mdico [place] el sigmoideoscopio para observar el ltimo tercio del colon.  Colonoscopa. Esta prueba es similar a la sigmoideoscopia, pero el mdico examina todo el colon. Este es el mtodo ms frecuente para Hydrographic surveyor y extirpar los plipos. TRATAMIENTO  Todo plipo ser extirpado Daneil Dan sigmoideoscopa o una colonoscopa. Luego se analizan para Heritage manager.  PREVENCIN  Para disminuir los riesgos de volver a Best boy plipos en el colon:   Coma mucha fruta y [place]. Evite las comidas grasas.  No fume.  Evite consumir alcohol.  [place].  Baje de peso segn las indicaciones de su mdico.  Consuma mucho clcio y folato. Las comidas que contienen calcio son la [place], los quesos y el brcoli. Las comidas que contienen folato son los garbanzos, los frijoles rojos y Nurse, mental health. INSTRUCCIONES PARA EL CUIDADO EN EL HOGAR  Cumpla con todas las visitas de control, segn le indique su mdico. Podr necesitar exmenes peridicos para controlar si vuelven a aparecer.  SOLICITE ATENCIN MDICA SI:  Nota sangrado al mover el intestino.    Esta informacin no tiene Marine scientist el consejo del mdico. Asegrese de hacerle al mdico cualquier pregunta que tenga.  Diverticulosis (Diverticulosis) La diverticulosis es una enfermedad que aparece cuando se forman pequeos bolsillos (divertculos) en las paredes del colon. El colon, o intestino grueso, es el lugar donde se absorbe agua y se forman las heces. Los bolsillos se forman cuando la capa interna del colon  ejerce presin sobre los puntos dbiles de las capas externas. CAUSAS  Nadie sabe con exactitud qu causa la diverticulosis. FACTORES DE RIESGO  Ser mayor de 7aos. El riesgo de desarrollar esta enfermedad aumenta con la edad. La diverticulosis es poco frecuente en las personas menores de [place]. A los 80aos, casi todas las personas tienen la enfermedad.  Comer una dieta con bajo contenido de [place].  Estar estreido con frecuencia.  Tener sobrepeso.  No hacer suficiente ejercicio fsico.  Fumar.  Tomar analgsicos de venta libre, como aspirina e ibuprofeno. SNTOMAS  La mayora de las personas que tienen diverticulosis no presentan sntomas. DIAGNSTICO  Dado que la diverticulosis no suele causar sntomas, los mdicos a menudo descubren la enfermedad durante un examen de otros problemas de colon. En muchos casos, el mdico diagnosticar la diverticulosis mientras utiliza un endoscopio flexible para examinar el colon (colonoscopa). TRATAMIENTO  Si nunca tuvo una infeccin relacionada con la diverticulosis, es posible que no necesite tratamiento. Si ha tenido una infeccin antes, el tratamiento puede incluir:  Comer ms frutas, verduras y cereales.  Tomar un suplemento de [place].  Tomar un suplemento de bacterias vivas (probitico).  Tomar medicamentos para relajar el colon. INSTRUCCIONES PARA EL CUIDADO EN EL HOGAR   Beba por lo menos entre 6 y 8vasos de agua por da para Engineer, civil (consulting).  Trate de no hacer fuerza al mover el intestino.  Cumpla con todas las visitas de control. Si ha tenido una infeccin antes:   Aumente la cantidad de fibra en la dieta, segn las indicaciones del mdico o del nutricionista.  Tome un suplemento dietario con fibras si el mdico lo autoriza.  Tome los medicamentos solamente como se lo haya indicado el mdico. SOLICITE ATENCIN MDICA SI:   Siente dolor abdominal.  Tiene meteorismo.  Tiene clicos.  No ha defecado  en  3das. SOLICITE ATENCIN MDICA DE INMEDIATO SI:   El dolor empeora.  El meteorismo [employer].  Tiene fiebre o escalofros, y los sntomas empeoran repentinamente.  Comienza a vomitar.  La materia fecal es sanguinolenta o negra. ASEGRESE DE QUE:  Comprende estas instrucciones.  Controlar su afeccin.  Recibir ayuda de inmediato si no mejora o si empeora.   Esta informacin no tiene Marine scientist el consejo del mdico. Asegrese de hacerle al mdico cualquier pregunta que tenga.   Colonoscopa: cuidados posteriores (Colonoscopy, Care After) Siga estas instrucciones durante las prximas semanas. Estas indicaciones le proporcionan informacin general acerca de cmo deber cuidarse despus del procedimiento. El mdico tambin podr darle instrucciones ms especficas. El tratamiento ha sido planificado segn las prcticas mdicas actuales, pero en algunos casos pueden ocurrir problemas. Comunquese con el mdico si tiene algn problema o tiene dudas despus del procedimiento. QU ESPERAR DESPUS DEL PROCEDIMIENTO  Despus del procedimiento, es comn tener las siguientes sensaciones: Una pequea cantidad de sangre en la materia fecal. Cantidades moderadas de gases e hinchazn o calambres abdominales leves. INSTRUCCIONES PARA EL CUIDADO EN EL HOGAR No conduzca vehculos, opere maquinarias ni firme documentos importantes durante 24horas. Puede ducharse y retomar sus actividades fsicas habituales, pero muvase a un ritmo ms lento durante las primeras 24horas. Tmese descansos frecuentes durante las primeras 24horas. [place] o colquese compresas calientes en el abdomen para ayudar a reducir los calambres e hinchazn abdominales. Beba suficiente lquido para Consulting civil engineer orina clara o de color amarillo plido. Puede retomar su dieta normal segn las instrucciones de su mdico. Evite los alimentos pesados o fritos que son difciles de [employer]. Evite consumir alcohol durante  24horas o segn las instrucciones de su mdico. Tome solo medicamentos de venta libre o recetados, segn las indicaciones del mdico. Si se obtuvo una muestra de tejido (biopsia) durante el procedimiento: No tome aspirina ni anticoagulantes durante 7das, o segn las instrucciones de su mdico. No consuma alcohol durante 7das o segn las instrucciones de su mdico. Consuma alimentos livianos durante las primeras 24horas. SOLICITE ATENCIN MDICA SI: Tiene manchas persistentes de sangre en la materia fecal entre 2 y 3das posteriores al procedimiento. SOLICITE ATENCIN MDICA DE INMEDIATO SI: Tiene ms que una pequea mancha de sangre en la materia fecal. Elimina grandes cogulos de sangre en la materia fecal. Tiene el abdomen hinchado (distendido). Tiene nuseas o vmitos. Tiene fiebre. Siente dolor intenso en el abdomen que no se alivia con los [employer].   Esta informacin no tiene Marine scientist el consejo del mdico. Asegrese de hacerle al mdico cualquier pregunta que tenga.

## 2015-10-10 NOTE — OR Nursing (Signed)
Basilia Jumbo Interpreter from [facility] present

## 2015-10-10 NOTE — Interval H&P Note (Signed)
History and Physical Interval Note:  10/10/2015 2:16 PM  Alexandra Brewer  has presented today for surgery, with the diagnosis of SCREENING  The various methods of treatment have been discussed with the patient and family. After consideration of risks, benefits and other options for treatment, the patient has consented to  Procedure(s) with comments: COLONOSCOPY (N/A) - 245 - Interpreter scheduled, do NOT move as a surgical intervention .  The patient's history has been reviewed, patient examined, no change in status, stable for surgery.  I have reviewed the patient's chart and labs.  Questions were answered to the patient's satisfaction.     Alexandra Brewer  No change. Screening colonoscopy per plan.  The risks, benefits, limitations, alternatives and imponderables have been reviewed with the patient. Questions have been answered. All parties are agreeable.

## 2015-10-10 NOTE — Op Note (Signed)
[facility] Patient Name: Alexandra Brewer Procedure Date: 10/10/2015 2:15 PM MRN: [id number] Date of Birth: Sep 11, 1955 Attending MD: Norvel Richards , MD CSN: [id number] Age: 60 Admit Type: Outpatient Procedure:                Colonoscopy with snare polypectomy Indications:              Screening for colorectal malignant neoplasm Providers:                Norvel Richards, MD, Lurline Del, RN, Purcell Nails.                            Tina Griffiths, Technician Referring MD:              Medicines:                Midazolam 5 mg IV, Meperidine 125 mg IV,                            Ondansetron 4 mg IV Complications:            No immediate complications. Estimated Blood Loss:     Estimated blood loss was minimal. Procedure:                Pre-Anesthesia Assessment:                           - Prior to the procedure, a History and Physical                            was performed, and patient medications and                            allergies were reviewed. The patient's tolerance of                            previous anesthesia was also reviewed. The risks                            and benefits of the procedure and the sedation                            options and risks were discussed with the patient.                            All questions were answered, and informed consent                            was obtained. Prior Anticoagulants: The patient has                            taken no previous anticoagulant or antiplatelet                            agents. ASA Grade Assessment: II - A patient with  mild systemic disease. After reviewing the risks                            and benefits, the patient was deemed in                            satisfactory condition to undergo the procedure.                           After obtaining informed consent, the colonoscope                            was passed under direct vision. Throughout the                    procedure, the patient's blood pressure, pulse, and                            oxygen saturations were monitored continuously. The                            [id number]) scope was introduced through                            the anus and advanced to the the cecum, identified                            by appendiceal orifice and ileocecal valve. The                            colonoscopy was performed without difficulty. The                            patient tolerated the procedure well. The quality                            of the bowel preparation was adequate. The entire                            colon was well visualized. The ileocecal valve,                            appendiceal orifice, and rectum were photographed. Scope In: 2:28:03 PM Scope Out: 2:41:53 PM Scope Withdrawal Time: 0 hours 9 minutes 32 seconds  Total Procedure Duration: 0 hours 13 minutes 50 seconds  Findings:      The perianal and digital rectal examinations were normal.      A 3 mm polyp was found in the cecum. The polyp was sessile. The polyp       was removed with a cold snare. Resection and retrieval were complete.       Estimated blood loss was minimal.      Multiple small-mouthed diverticula were found in the entire colon.       Estimated blood loss: none.      Internal hemorrhoids were found during retroflexion. The hemorrhoids  were Grade I (internal hemorrhoids that do not prolapse). Impression:               - One 3 mm polyp in the cecum, removed with a cold                            snare. Resected and retrieved.                           - Diverticulosis in the entire examined colon.                           - Internal hemorrhoids. Moderate Sedation:      Moderate (conscious) sedation was administered by the endoscopy nurse       and supervised by the endoscopist. The following parameters were       monitored: oxygen saturation, heart rate, blood pressure,  respiratory       rate, EKG, adequacy of pulmonary ventilation, and response to care.       Total physician intraservice time was 22 minutes. Recommendation:           - Patient has a contact number available for                            emergencies. The signs and symptoms of potential                            delayed complications were discussed with the                            patient. Return to normal activities tomorrow.                            Written discharge instructions were provided to the                            patient.                           - Advance diet as tolerated.                           - Continue present medications.                           - Repeat colonoscopy date to be determined after                            pending pathology results are reviewed for                            surveillance based on pathology results.                           - Return to GI office in 6 weeks. Procedure Code(s):        --- Professional ---  [phone], Colonoscopy, flexible; with removal of                            tumor(s), polyp(s), or other lesion(s) by snare                            technique                           99152, Moderate sedation services provided by the                            same physician or other qualified health care                            professional performing the diagnostic or                            therapeutic service that the sedation supports,                            requiring the presence of an independent trained                            observer to assist in the monitoring of the                            patient's level of consciousness and physiological                            status; initial 15 minutes of intraservice time,                            patient age 55 years or older Diagnosis Code(s):        --- Professional ---                           Z12.11, Encounter for  screening for malignant                            neoplasm of colon                           D12.0, Benign neoplasm of cecum                           K64.0, First degree hemorrhoids                           K57.30, Diverticulosis of large intestine without                            perforation or abscess without bleeding CPT copyright 2016 American [employer] Association. All rights reserved. The codes documented in this report are preliminary and upon coder review may  be revised to meet current compliance requirements. Herbie Baltimore  Hilton Cork, MD Norvel Richards, MD 10/10/2015 2:53:59 PM This report has been signed electronically. Number of Addenda: 0

## 2015-10-13 ENCOUNTER — Encounter (HOSPITAL_COMMUNITY): Payer: Self-pay | Admitting: Internal Medicine

## 2015-10-13 ENCOUNTER — Encounter: Payer: Self-pay | Admitting: Internal Medicine

## 2015-11-02 MED FILL — ATORVASTATIN 20 MG TABLET: 20 | 30 days supply | Qty: 30 | Fill #2

## 2015-11-02 MED FILL — ?LEVOTHYROXINE 50 MCG TABLE: 50 | 30 days supply | Qty: 30 | Fill #1

## 2015-11-30 ENCOUNTER — Telehealth: Payer: Self-pay | Admitting: Family Medicine

## 2015-11-30 NOTE — Telephone Encounter (Signed)
Pt. Called requesting to be referred to the dentist. Please f/u

## 2015-11-30 NOTE — Telephone Encounter (Signed)
Pt was called on 8/30, a message was left for pt to return the phone call. Need information on why she needs a dentist referral.

## 2015-12-19 ENCOUNTER — Ambulatory Visit: Payer: Self-pay | Attending: Internal Medicine

## 2015-12-28 ENCOUNTER — Ambulatory Visit: Payer: Self-pay | Admitting: Nurse Practitioner

## 2015-12-28 ENCOUNTER — Encounter: Payer: Self-pay | Admitting: Nurse Practitioner

## 2015-12-28 ENCOUNTER — Telehealth: Payer: Self-pay | Admitting: Nurse Practitioner

## 2015-12-28 ENCOUNTER — Ambulatory Visit: Payer: Self-pay | Attending: Family Medicine

## 2015-12-28 NOTE — Telephone Encounter (Signed)
Letter mailed

## 2015-12-28 NOTE — Telephone Encounter (Signed)
Noted  

## 2015-12-28 NOTE — Telephone Encounter (Signed)
Pt was a no show

## 2016-01-09 ENCOUNTER — Ambulatory Visit: Payer: Self-pay | Attending: Family Medicine | Admitting: Family Medicine

## 2016-01-09 ENCOUNTER — Encounter: Payer: Self-pay | Admitting: Family Medicine

## 2016-01-09 VITALS — BP 142/82 | HR 65 | Temp 98.0°F | Ht 61.0 in | Wt 162.8 lb

## 2016-01-09 DIAGNOSIS — E039 Hypothyroidism, unspecified: Secondary | ICD-10-CM | POA: Insufficient documentation

## 2016-01-09 DIAGNOSIS — Z79899 Other long term (current) drug therapy: Secondary | ICD-10-CM | POA: Insufficient documentation

## 2016-01-09 DIAGNOSIS — I83812 Varicose veins of left lower extremities with pain: Secondary | ICD-10-CM

## 2016-01-09 DIAGNOSIS — E038 Other specified hypothyroidism: Secondary | ICD-10-CM

## 2016-01-09 DIAGNOSIS — K029 Dental caries, unspecified: Secondary | ICD-10-CM

## 2016-01-09 DIAGNOSIS — M79672 Pain in left foot: Secondary | ICD-10-CM | POA: Insufficient documentation

## 2016-01-09 DIAGNOSIS — R030 Elevated blood-pressure reading, without diagnosis of hypertension: Secondary | ICD-10-CM | POA: Insufficient documentation

## 2016-01-09 DIAGNOSIS — Z23 Encounter for immunization: Secondary | ICD-10-CM

## 2016-01-09 DIAGNOSIS — M79671 Pain in right foot: Secondary | ICD-10-CM

## 2016-01-09 LAB — TSH: TSH: 8.6 mIU/L — ABNORMAL HIGH

## 2016-01-09 MED ORDER — MEDICAL COMPRESSION STOCKINGS MISC: 0 refills | Status: DC

## 2016-01-09 MED ORDER — MELOXICAM 7.5 MG PO TABS: 7.5000 mg | ORAL_TABLET | Freq: Every day | ORAL | 0 refills | Status: DC

## 2016-01-09 MED FILL — ?MELOXICAM 7.5 MG TABLET: 7.5 | 30 days supply | Qty: 30 | Fill #0

## 2016-01-09 NOTE — Patient Instructions (Addendum)
Janecia was seen today for referral.  Diagnoses and all orders for this visit:  Subclinical hypothyroidism -     TSH  Pain due to dental caries -     Ambulatory referral to Dentistry  Varicose veins of left lower extremity with pain -     meloxicam (MOBIC) 7.5 MG tablet; Take 1 tablet (7.5 mg total) by mouth daily. -     Elastic Bandages & Supports (MEDICAL COMPRESSION STOCKINGS) MISC; 25-30 mm Hg  Heel pain, bilateral -     meloxicam (MOBIC) 7.5 MG tablet; Take 1 tablet (7.5 mg total) by mouth daily.   F/u in 6 weeks for leg pains   Dr. Adrian Blackwater

## 2016-01-09 NOTE — Progress Notes (Signed)
Pt is getting flu shot today. 

## 2016-01-09 NOTE — Progress Notes (Signed)
Subjective:  Patient ID: Alexandra Brewer, female    DOB: [birth year]  Age: 60 y.o. MRN: [id number]  CC: Referral (dental)   HPI Alexandra Brewer presents for   1. Dental referral: she is having pain in her front teeth. No oral swelling or fever.   2. F/u hypothyroidism: she is taking synthroid every morning. She has lost weight since dose of synthroid was increased.   3. Elevated blood pressure: pressure is elevated today. She reports that she is feeling a little dizzy at times for the past month, 1-2 times per week. No CP, SOB or leg swelling.   4. Leg pains: has known varicose veins.   Social History  Substance Use Topics  . Smoking status: Never Smoker  . Smokeless tobacco: Never Used  . Alcohol use No    Outpatient Medications Prior to Visit  Medication Sig Dispense Refill  . atorvastatin (LIPITOR) 20 MG tablet Take 1 tablet (20 mg total) by mouth daily. Take every evening at dinner. 30 tablet 11  . levothyroxine (SYNTHROID, LEVOTHROID) 50 MCG tablet Take 1 tablet (50 mcg total) by mouth daily. 30 tablet 2  . NON FORMULARY OTC allergy pill     No facility-administered medications prior to visit.     ROS Review of Systems  Constitutional: Negative for chills and fever.  Eyes: Negative for visual disturbance.  Respiratory: Negative for shortness of breath.   Cardiovascular: Negative for chest pain.  Gastrointestinal: Positive for constipation. Negative for abdominal pain, blood in stool, diarrhea, nausea, rectal pain and vomiting.  Musculoskeletal: Negative for arthralgias and back pain.  Skin: Negative for rash.  Allergic/Immunologic: Negative for immunocompromised state.  Hematological: Negative for adenopathy. Does not bruise/bleed easily.  Psychiatric/Behavioral: Negative for dysphoric mood and suicidal ideas.    Objective:  BP (!) 158/86 (BP Location: Left Arm, Patient Position: Sitting, Cuff Size: Small)   Pulse 65   Temp 98 F (36.7 C) (Oral)   Ht  5\' 1"  (1.549 m)   Wt 162 lb 12.8 oz (73.8 kg)   SpO2 99%   BMI 30.76 kg/m   BP/Weight 01/09/2016 10/10/2015 [id number]  Systolic BP 0000000 0000000 0000000  Diastolic BP 86 71 76  Wt. (Lbs) 162.8 177 160  BMI 30.76 30.37 30.25    Physical Exam  Constitutional: She is oriented to person, place, and time. She appears well-developed and well-nourished. No distress.  HENT:  Head: Normocephalic and atraumatic.  Mouth/Throat: Dental caries present.  Cardiovascular: Normal rate, regular rhythm, normal heart sounds and intact distal pulses.   Pulmonary/Chest: Effort normal and breath sounds normal.  Abdominal: Soft. Bowel sounds are normal. She exhibits no distension and no mass. There is no tenderness. There is no rebound and no guarding.  Musculoskeletal: She exhibits no edema.       Legs: Neurological: She is alert and oriented to person, place, and time.  Skin: Skin is warm and dry. No rash noted.  Psychiatric: She has a normal mood and affect.   Lab Results  Component Value Date   TSH 7.56 (H) 08/05/2015     Assessment & Plan:  Alexandra Brewer was seen today for referral.  Diagnoses and all orders for this visit:  Subclinical hypothyroidism -     TSH -     levothyroxine (SYNTHROID, LEVOTHROID) 75 MCG tablet; Take 1 tablet (75 mcg total) by mouth daily.  Pain due to dental caries -     Ambulatory referral to Dentistry  Varicose veins of left lower extremity with pain -  meloxicam (MOBIC) 7.5 MG tablet; Take 1 tablet (7.5 mg total) by mouth daily. -     Elastic Bandages & Supports (MEDICAL COMPRESSION STOCKINGS) MISC; 25-30 mm Hg  Heel pain, bilateral -     meloxicam (MOBIC) 7.5 MG tablet; Take 1 tablet (7.5 mg total) by mouth daily.  Encounter for immunization -     Flu Vaccine QUAD 36+ mos IM   There are no diagnoses linked to this encounter.  No orders of the defined types were placed in this encounter.   Follow-up: Return in about 6 weeks (around 02/20/2016) for leg pains .    Boykin Nearing MD

## 2016-01-10 DIAGNOSIS — M79671 Pain in right foot: Secondary | ICD-10-CM | POA: Insufficient documentation

## 2016-01-10 DIAGNOSIS — I83812 Varicose veins of left lower extremities with pain: Secondary | ICD-10-CM | POA: Insufficient documentation

## 2016-01-10 DIAGNOSIS — M79672 Pain in left foot: Secondary | ICD-10-CM

## 2016-01-10 DIAGNOSIS — K029 Dental caries, unspecified: Secondary | ICD-10-CM | POA: Insufficient documentation

## 2016-01-10 MED ORDER — LEVOTHYROXINE SODIUM 75 MCG PO TABS: 75.0000 ug | ORAL_TABLET | Freq: Every day | ORAL | 3 refills | Status: DC

## 2016-01-10 MED FILL — LEVOTHYROXINE 75 MCG TABLET: 75 | 30 days supply | Qty: 30 | Fill #0

## 2016-01-10 NOTE — Assessment & Plan Note (Signed)
Pain in b/l heels with normal exam. Also with leg pain.  Plan: Continue inserts Treat hypothyroidism which is likely contributing to myalgias

## 2016-01-10 NOTE — Assessment & Plan Note (Signed)
Varicose veins with  Pain in legs Rx for compression stockings

## 2016-01-10 NOTE — Assessment & Plan Note (Signed)
A: hypothyroidism Lab Results  Component Value Date   TSH 8.60 (H) 01/09/2016   P: Increase synthroid to 75 mcg daily

## 2016-01-10 NOTE — Assessment & Plan Note (Signed)
Dental referral placed.

## 2016-01-13 ENCOUNTER — Telehealth: Payer: Self-pay

## 2016-01-13 NOTE — Telephone Encounter (Signed)
Pt was called on 10/13 and informed of results.

## 2016-02-07 MED FILL — LEVOTHYROXINE 75 MCG TABLET: 75 | 30 days supply | Qty: 30 | Fill #1

## 2016-02-14 ENCOUNTER — Telehealth: Payer: Self-pay | Admitting: Nurse Practitioner

## 2016-02-14 ENCOUNTER — Ambulatory Visit: Payer: Self-pay | Admitting: Nurse Practitioner

## 2016-02-14 ENCOUNTER — Encounter: Payer: Self-pay | Admitting: Nurse Practitioner

## 2016-02-14 NOTE — Telephone Encounter (Signed)
Noted. I believe this is her second consecutive no-show.

## 2016-02-14 NOTE — Progress Notes (Deleted)
Referring Provider: Boykin Nearing, MD Primary Care Physician:  Minerva Ends, MD Primary GI:  Dr. Gala Romney  No chief complaint on file.   HPI:   Alexandra Brewer is a 60 y.o. female who presents for follow-up on constipation. The patient was last seen in our office 09/27/2015 for constipation and to schedule a colonoscopy. At that time she noted constipation and straining for several years, worse in the past year with minimal abdominal pain. No other GI symptoms. History of asthma but no acute exacerbations in the previous 2 years. Has tried MiraLAX which has not helped. Takes GoLYTELY as needed, other natural remedies have not helped. Associated with bloating. She was due for her first ever colonoscopy at that time. This was scheduled and she was started on Linzess 145 g for constipation. Recommended 3 month follow-up.  Colonoscopy was completed on 10/10/2015 and found a 3 mm polyp in the cecum describes a sessile, multiple smallmouth diverticula in the colon throughout, internal hemorrhoids noted as grade 1. Surgical pathology found the polyp to be benign colon mucosa. Recommended 10 year repeat colonoscopy.  The patient was a no-show for her follow-up office visit scheduled 12/28/2015.  Today she states   Past Medical History:  Diagnosis Date  . Asthma   . Constipation   . Hyperlipemia     Past Surgical History:  Procedure Laterality Date  . CATARACT EXTRACTION    . COLONOSCOPY N/A 10/10/2015   Procedure: COLONOSCOPY;  Surgeon: Daneil Dolin, MD;  Location: [facility];  Service: Endoscopy;  Laterality: N/A;  245 - Interpreter scheduled, do NOT move    Current Outpatient Prescriptions  Medication Sig Dispense Refill  . atorvastatin (LIPITOR) 20 MG tablet Take 1 tablet (20 mg total) by mouth daily. Take every evening at dinner. 30 tablet 11  . Elastic Bandages & Supports (MEDICAL COMPRESSION STOCKINGS) MISC 25-30 mm Hg 2 each 0  . levothyroxine (SYNTHROID,  LEVOTHROID) 75 MCG tablet Take 1 tablet (75 mcg total) by mouth daily. 30 tablet 3  . meloxicam (MOBIC) 7.5 MG tablet Take 1 tablet (7.5 mg total) by mouth daily. 30 tablet 0  . NON FORMULARY OTC allergy pill     No current facility-administered medications for this visit.     Allergies as of 02/14/2016 - Review Complete 01/09/2016  Allergen Reaction Noted  . Penicillins  07/20/2015    Family History  Problem Relation Age of Onset  . Colon cancer Neg Hx     Social History   Social History  . Marital status: Single    Spouse name: N/A  . Number of children: N/A  . Years of education: N/A   Social History Main Topics  . Smoking status: Never Smoker  . Smokeless tobacco: Never Used  . Alcohol use No  . Drug use: No  . Sexual activity: Not on file   Other Topics Concern  . Not on file   Social History Narrative  . No narrative on file    Review of Systems: General: Negative for anorexia, weight loss, fever, chills, fatigue, weakness. Eyes: Negative for vision changes.  ENT: Negative for hoarseness, difficulty swallowing , nasal congestion. CV: Negative for chest pain, angina, palpitations, dyspnea on exertion, peripheral edema.  Respiratory: Negative for dyspnea at rest, dyspnea on exertion, cough, sputum, wheezing.  GI: See history of present illness. GU:  Negative for dysuria, hematuria, urinary incontinence, urinary frequency, nocturnal urination.  MS: Negative for joint pain, low back pain.  Derm: Negative  for rash or itching.  Neuro: Negative for weakness, abnormal sensation, seizure, frequent headaches, memory loss, confusion.  Psych: Negative for anxiety, depression, suicidal ideation, hallucinations.  Endo: Negative for unusual weight change.  Heme: Negative for bruising or bleeding. Allergy: Negative for rash or hives.   Physical Exam: There were no vitals taken for this visit. General:   Alert and oriented. Pleasant and cooperative. Well-nourished and  well-developed.  Head:  Normocephalic and atraumatic. Eyes:  Without icterus, sclera clear and conjunctiva pink.  Ears:  Normal auditory acuity. Mouth:  No deformity or lesions, oral mucosa pink.  Throat/Neck:  Supple, without mass or thyromegaly. Cardiovascular:  S1, S2 present without murmurs appreciated. Normal pulses noted. Extremities without clubbing or edema. Respiratory:  Clear to auscultation bilaterally. No wheezes, rales, or rhonchi. No distress.  Gastrointestinal:  +BS, soft, non-tender and non-distended. No HSM noted. No guarding or rebound. No masses appreciated.  Rectal:  Deferred  Musculoskalatal:  Symmetrical without gross deformities. Normal posture. Skin:  Intact without significant lesions or rashes. Neurologic:  Alert and oriented x4;  grossly normal neurologically. Psych:  Alert and cooperative. Normal mood and affect. Heme/Lymph/Immune: No significant cervical adenopathy. No excessive bruising noted.    02/14/2016 9:40 AM   Disclaimer: This note was dictated with voice recognition software. Similar sounding words can inadvertently be transcribed and may not be corrected upon review.

## 2016-02-14 NOTE — Telephone Encounter (Signed)
PT WAS A NO SHOW AND LETTER SENT  °

## 2016-02-20 ENCOUNTER — Ambulatory Visit: Payer: Self-pay | Attending: Family Medicine | Admitting: Family Medicine

## 2016-02-20 ENCOUNTER — Encounter: Payer: Self-pay | Admitting: Family Medicine

## 2016-02-20 VITALS — BP 135/84 | HR 74 | Temp 97.7°F | Ht 61.0 in | Wt 162.4 lb

## 2016-02-20 DIAGNOSIS — M79671 Pain in right foot: Secondary | ICD-10-CM | POA: Insufficient documentation

## 2016-02-20 DIAGNOSIS — Z23 Encounter for immunization: Secondary | ICD-10-CM

## 2016-02-20 DIAGNOSIS — M19011 Primary osteoarthritis, right shoulder: Secondary | ICD-10-CM | POA: Insufficient documentation

## 2016-02-20 DIAGNOSIS — M79672 Pain in left foot: Secondary | ICD-10-CM | POA: Insufficient documentation

## 2016-02-20 DIAGNOSIS — Z79899 Other long term (current) drug therapy: Secondary | ICD-10-CM | POA: Insufficient documentation

## 2016-02-20 DIAGNOSIS — I83812 Varicose veins of left lower extremities with pain: Secondary | ICD-10-CM | POA: Insufficient documentation

## 2016-02-20 DIAGNOSIS — R030 Elevated blood-pressure reading, without diagnosis of hypertension: Secondary | ICD-10-CM | POA: Insufficient documentation

## 2016-02-20 DIAGNOSIS — E039 Hypothyroidism, unspecified: Secondary | ICD-10-CM | POA: Insufficient documentation

## 2016-02-20 MED ORDER — MELOXICAM 15 MG PO TABS: 15.0000 mg | ORAL_TABLET | Freq: Every day | ORAL | 2 refills | Status: DC

## 2016-02-20 MED FILL — MELOXICAM 15 MG TABLET: 15 | 30 days supply | Qty: 30 | Fill #0

## 2016-02-20 NOTE — Progress Notes (Signed)
Subjective:  Patient ID: Alexandra Brewer, female    DOB: 05-24-1955  Age: 60 y.o. MRN: [id number]  CC: Leg Pain   HPI Graciemae Carothers presents for   1. F/u hypothyroidism: she is taking synthroid every morning. She has aches in R arm and both heels.   2. Elevated blood pressure: pressure is elevated today. She reports that she is feeling a little dizzy at times for the past month, 1-2 times per week. No CP, SOB or leg swelling.   3. Leg pains: has known varicose veins. She is wearing compression stockings which have helped with her varicose veins.  4. Bilateral heel pain: x 2 years. R is worse than L. Worse in early AM and late afternoon. No swelling or skin discoloration. She also has L achilles tendon pain. No injury.   5. R arm pain: for many years, over 2 years. 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 No loss of grip strength. Pain with raising L arm.   Social History  Substance Use Topics  . Smoking status: Never Smoker  . Smokeless tobacco: Never Used  . Alcohol use No    Outpatient Medications Prior to Visit  Medication Sig Dispense Refill  . atorvastatin (LIPITOR) 20 MG tablet Take 1 tablet (20 mg total) by mouth daily. Take every evening at dinner. 30 tablet 11  . Elastic Bandages & Supports (MEDICAL COMPRESSION STOCKINGS) MISC 25-30 mm Hg 2 each 0  . levothyroxine (SYNTHROID, LEVOTHROID) 75 MCG tablet Take 1 tablet (75 mcg total) by mouth daily. 30 tablet 3  . meloxicam (MOBIC) 7.5 MG tablet Take 1 tablet (7.5 mg total) by mouth daily. 30 tablet 0  . NON FORMULARY OTC allergy pill     No facility-administered medications prior to visit.     ROS Review of Systems  Constitutional: Negative for chills and fever.  Eyes: Negative for visual disturbance.  Respiratory: Negative for shortness of breath.   Cardiovascular: Negative for chest pain.  Gastrointestinal: Positive for constipation. Negative for abdominal pain, blood in stool, diarrhea, nausea,  rectal pain and vomiting.  Musculoskeletal: Negative for arthralgias and back pain.  Skin: Negative for rash.  Allergic/Immunologic: Negative for immunocompromised state.  Hematological: Negative for adenopathy. Does not bruise/bleed easily.  Psychiatric/Behavioral: Negative for dysphoric mood and suicidal ideas.    Objective:  BP 135/84 (BP Location: Left Arm, Patient Position: Sitting, Cuff Size: Small)   Pulse 74   Temp 97.7 F (36.5 C) (Oral)   Ht 5\' 1"  (1.549 m)   Wt 162 lb 6.4 oz (73.7 kg)   SpO2 100%   BMI 30.69 kg/m   BP/Weight 02/20/2016 01/09/2016 [id number]  Systolic BP [id number] 0000000  Diastolic BP 84 82 71  Wt. (Lbs) 162.4 162.8 177  BMI 30.69 30.76 30.37    Physical Exam  Constitutional: She is oriented to person, place, and time. She appears well-developed and well-nourished. No distress.  HENT:  Head: Normocephalic and atraumatic.  Mouth/Throat: Dental caries present.  Cardiovascular: Normal rate, regular rhythm, normal heart sounds and intact distal pulses.   Pulmonary/Chest: Effort normal and breath sounds normal.  Abdominal: Soft. Bowel sounds are normal. She exhibits no distension and no mass. There is no tenderness. There is no rebound and no guarding.  Musculoskeletal: She exhibits no edema.       Right shoulder: She exhibits tenderness and pain. She exhibits normal range of motion, no bony tenderness, no swelling, no effusion, no crepitus, no deformity, no laceration, no  spasm, normal pulse and normal strength.       Arms:      Legs:      Feet:  Neurological: She is alert and oriented to person, place, and time.  Skin: Skin is warm and dry. No rash noted.  Psychiatric: She has a normal mood and affect.   Lab Results  Component Value Date   TSH 8.60 (H) 01/09/2016    Assessment & Plan:  Nasrin was seen today for leg pain.  Diagnoses and all orders for this visit:  Arthritis of right shoulder region -     meloxicam (MOBIC) 15 MG tablet; Take 1  tablet (15 mg total) by mouth daily.  Varicose veins of left lower extremity with pain -     meloxicam (MOBIC) 15 MG tablet; Take 1 tablet (15 mg total) by mouth daily.  Heel pain, bilateral -     meloxicam (MOBIC) 15 MG tablet; Take 1 tablet (15 mg total) by mouth daily. -     Ambulatory referral to Podiatry  Hypothyroidism, unspecified type -     TSH; Future  Other orders -     Tdap vaccine greater than or equal to 7yo IM   There are no diagnoses linked to this encounter. Patient filled out mammogram scholarship form, form faxed   Meds ordered this encounter  Medications  . meloxicam (MOBIC) 15 MG tablet    Sig: Take 1 tablet (15 mg total) by mouth daily.    Dispense:  30 tablet    Refill:  2    Follow-up: Return in about 2 weeks (around 03/05/2016) for labs.   Boykin Nearing MD

## 2016-02-20 NOTE — Progress Notes (Signed)
Pt is here today to follow up on heel pain. Pt states that her throat and ears are hurting.

## 2016-02-20 NOTE — Patient Instructions (Addendum)
Alexandra Brewer was seen today for leg pain.  Diagnoses and all orders for this visit:  Arthritis of right shoulder region -     meloxicam (MOBIC) 15 MG tablet; Take 1 tablet (15 mg total) by mouth daily.  Varicose veins of left lower extremity with pain -     meloxicam (MOBIC) 15 MG tablet; Take 1 tablet (15 mg total) by mouth daily.  Heel pain, bilateral -     meloxicam (MOBIC) 15 MG tablet; Take 1 tablet (15 mg total) by mouth daily. -     Ambulatory referral to Podiatry  Hypothyroidism, unspecified type -     TSH; Future  Other orders -     Tdap vaccine greater than or equal to 7yo IM   Ice- for 20 minutes 1-2 times daily Stretch at home 1-2 times per day  F/u in 2 weeks for labs, TSH   F/u in 6 weeks for heel pain   Dr. Adrian Blackwater    Fascitis plantar con rehabilitacin Consulte al mdico qu ejercicios son seguros para usted. Haga los ejercicios exactamente como se lo haya indicado el mdico y gradelos como se lo hayan indicado. Es normal sentir tirantez, tensin, presin o molestias leves mientras hace estos ejercicios, pero debe detenerse de inmediato si siente dolor repentino o si el dolor empeora. No comience a hacer estos ejercicios hasta que se lo indique el mdico. EJERCICIOS DE [employer] Y AMPLITUD DE MOVIMIENTOS  Estos ejercicios calientan los msculos y las articulaciones, y mejoran la movilidad y la flexibilidad del pie. Adems, ayudan a Best boy.  EjercicioA: Estiramiento de la fascia plantar  1. Sintese con la pierna izquierda/derecha cruzada sobre la rodilla opuesta. 2. Sostenga el taln con [place], con el pulgar cerca del arco. Con la otra mano, sostenga los dedos de los pies y empjelos con [place]. Debe sentir un estiramiento en la parte de abajo de los dedos o del pie, o de [place]. 3. Mantenga esta posicin durante _________ segundos. 4. Afloje lentamente los dedos y vuelva a la posicin inicial. Repita __________ veces. Realice este  ejercicio __________ veces al da.  EjercicioB: Msculos gemelos, de pie  1. Prese con las [employer] pared. 2. Extienda la pierna izquierda/derecha hacia atrs y flexione ligeramente la rodilla de la pierna de adelante. 3. Mantenga los talones en el suelo y la rodilla de atrs extendida, y pase el peso hacia la pared, sin arquear la espalda. Debe sentir un estiramiento suave en la pantorrilla izquierda/derecha. 4. Mantenga esta posicin durante ___________ segundos. Repita __________ veces. Realice este ejercicio __________ veces al da. EjercicioC: Msculo sleo, de pie  1. Prese con las [employer] pared. 2. Extienda la pierna izquierda/derecha hacia atrs y flexione ligeramente la rodilla de la pierna de adelante. 3. Mantenga los talones apoyados en el suelo, flexione la rodilla de atrs y lleve el peso ligeramente a la pierna de atrs. Debe sentir un estiramiento suave en la parte profunda de la pantorrilla. 4. Mantenga esta posicin durante ___________ segundos. Repita __________ veces. Realice este ejercicio __________ veces al da. EjercicioD: Msculos gemelos y sleo, de pie  1. Prese sobre un escaln apoyando solo la regin metatarsiana de su pie derecho/izquierdo. La parte metatarsiana de la planta del pie es la superficie sobre la que caminamos, justo debajo de los dedos. 2. Mantenga el otro pie apoyado con firmeza en el mismo escaln. 3. Sostngase de la pared o de una baranda para mantener el equilibrio. 4.  Levante lentamente el [employer], y permita que el peso del cuerpo presione el taln sobre el borde del escaln. Debe sentir un estiramiento en la pantorrilla izquierda/derecha. 5. Mantenga esta posicin durante ___________ segundos. 6. Vuelva a poner ambos pies sobre el escaln. 7. Repita este ejercicio con una leve flexin en la rodilla izquierda/derecha. Reptalo __________ veces con la rodilla izquierda/derecha extendida y __________ veces con la  rodilla izquierda/derecha flexionada. Realice este ejercicio __________ veces al da. EJERCICIO DE EQUILIBRIO  Este ejercicio aumenta el equilibrio y el control de la fuerza del arco, para ayudar a reducir la presin sobre la fascia plantar. EjercicioE: Pararse en una sola pierna  1. Sin calzado, prese cerca de una baranda o [place]. Puede sostenerse de la baranda o del marco de la puerta, segn lo necesite. 2. Prese sobre el pie izquierdo/derecho. Sin despegar el dedo gordo del suelo, intente mantener el arco levantado. No deje que el pie se vaya hacia adentro. 3. Mantenga esta posicin durante ___________ segundos. 4. Si este ejercicio es muy fcil, puede intentar [employer] con los ojos cerrados o parado sobre [place]. Repita __________ veces. Realice este ejercicio __________ veces al da. Esta informacin no tiene Marine scientist el consejo del mdico. Asegrese de hacerle al mdico cualquier pregunta que tenga. Document Released: 01/03/2006 Document Revised: 08/03/2014 Document Reviewed: 01/31/2015 Elsevier Interactive Patient Education  2017 [employer].

## 2016-02-21 DIAGNOSIS — M7501 Adhesive capsulitis of right shoulder: Secondary | ICD-10-CM | POA: Insufficient documentation

## 2016-02-21 NOTE — Assessment & Plan Note (Signed)
Exam consistent with OA R shoulder Plan mobic

## 2016-02-21 NOTE — Assessment & Plan Note (Signed)
Compliant with synthroid Still with myalgias Last TSH was elevated  P: Repeat TSH in 2 weeks Adjust synthroid as needed

## 2016-02-21 NOTE — Assessment & Plan Note (Signed)
Improved Minimal pain Continue compression stockings

## 2016-02-21 NOTE — Assessment & Plan Note (Signed)
Consistent with b/l plantar fasciitis and left achilles tendinopathy Plan: mobic Reviewed icing, stretches, use of heel cups Podiatry referral

## 2016-03-05 ENCOUNTER — Other Ambulatory Visit: Payer: Self-pay

## 2016-03-07 ENCOUNTER — Ambulatory Visit: Payer: Self-pay | Attending: Family Medicine

## 2016-03-07 DIAGNOSIS — E039 Hypothyroidism, unspecified: Secondary | ICD-10-CM | POA: Insufficient documentation

## 2016-03-07 LAB — TSH: TSH: 2.87 mIU/L

## 2016-03-07 MED FILL — LEVOTHYROXINE 75 MCG TABLET: 75 | 30 days supply | Qty: 30 | Fill #2

## 2016-03-07 NOTE — Progress Notes (Signed)
Patient here for lab visit only 

## 2016-03-09 ENCOUNTER — Telehealth: Payer: Self-pay

## 2016-03-09 NOTE — Telephone Encounter (Signed)
Pt was called and a VM was left informing pt of lab results. 

## 2016-03-14 ENCOUNTER — Other Ambulatory Visit: Payer: Self-pay | Admitting: Obstetrics and Gynecology

## 2016-03-14 DIAGNOSIS — Z1231 Encounter for screening mammogram for malignant neoplasm of breast: Secondary | ICD-10-CM

## 2016-04-02 HISTORY — PX: BREAST BIOPSY: SHX20

## 2016-04-05 ENCOUNTER — Ambulatory Visit
Admission: RE | Admit: 2016-04-05 | Discharge: 2016-04-05 | Disposition: A | Discharge status: Home / Self Care | Payer: No Typology Code available for payment source | Source: Ambulatory Visit | Attending: Obstetrics and Gynecology | Admitting: Obstetrics and Gynecology

## 2016-04-05 ENCOUNTER — Encounter (HOSPITAL_COMMUNITY): Payer: Self-pay

## 2016-04-05 ENCOUNTER — Other Ambulatory Visit (HOSPITAL_COMMUNITY): Payer: Self-pay | Admitting: *Deleted

## 2016-04-05 ENCOUNTER — Other Ambulatory Visit (HOSPITAL_COMMUNITY): Payer: Self-pay | Admitting: Obstetrics and Gynecology

## 2016-04-05 ENCOUNTER — Ambulatory Visit (HOSPITAL_COMMUNITY)
Admission: RE | Admit: 2016-04-05 | Discharge: 2016-04-05 | Disposition: A | Discharge status: Home / Self Care | Payer: Self-pay | Source: Ambulatory Visit | Attending: Obstetrics and Gynecology | Admitting: Obstetrics and Gynecology

## 2016-04-05 VITALS — BP 126/72 | Temp 98.6°F | Ht 60.0 in | Wt 160.4 lb

## 2016-04-05 DIAGNOSIS — N6320 Unspecified lump in the left breast, unspecified quadrant: Secondary | ICD-10-CM

## 2016-04-05 DIAGNOSIS — Z1231 Encounter for screening mammogram for malignant neoplasm of breast: Secondary | ICD-10-CM

## 2016-04-05 DIAGNOSIS — Z1239 Encounter for other screening for malignant neoplasm of breast: Secondary | ICD-10-CM

## 2016-04-05 DIAGNOSIS — N6323 Unspecified lump in the left breast, lower outer quadrant: Secondary | ICD-10-CM

## 2016-04-05 HISTORY — DX: Disorder of thyroid, unspecified: E07.9

## 2016-04-05 NOTE — Patient Instructions (Signed)
Explained breast self awareness to [facility]. Patient did not need a Pap smear today due to last Pap smear and HPV typing was 07/06/2015. Let her know BCCCP will cover Pap smears and HPV typing every 5 years unless has a history of abnormal Pap smears. Referred patient to the [facility] for diagnostic mammogram and possible left breast ultrasound. Appointment scheduled for Thursday, April 05, 2016 at 1545. Sheilla Reyes-Garcia verbalized understanding.  [place], Arvil Chaco, RN 3:47 PM

## 2016-04-05 NOTE — Progress Notes (Signed)
Complaints of left breast lump x 3 weeks that is painful when squeezes. Patient stated a white drainage comes out of the lump when squeezes at times.   Pap Smear: Pap smear not completed today. Last Pap smear was 07/06/2015 at [place] and normal with negative HPV. Per patient has no history of an abnormal Pap smear. Last Pap smear result is in EPIC.  Physical exam: Breasts Breasts symmetrical. No skin abnormalities bilateral breasts. No nipple retraction bilateral breasts. No nipple discharge bilateral breasts. No lymphadenopathy. No lumps palpated right breast. Palpated a lump on the areola at 5 o'clock. No drainage came out of lump when palpated. No complaints of pain or tenderness on exam. Referred patient to the [facility] for diagnostic mammogram and possible left breast ultrasound. Appointment scheduled for Thursday, April 05, 2016 at 1545.        Pelvic/Bimanual No Pap smear completed today since last Pap smear and HPV typing was completed 07/06/2015. Pap smear not indicated per BCCCP guidelines.   Smoking History: Patient has never smoked.  Patient Navigation: Patient education provided. Access to services provided for patient through [facility] program. Spanish interpreter provided.  Colorectal Cancer Screening: Patient had a colonoscopy completed 10/10/2015. No complaints today.  Used Spanish interpreter Rudene Anda from [facility]

## 2016-04-09 ENCOUNTER — Encounter (HOSPITAL_COMMUNITY): Payer: Self-pay | Admitting: *Deleted

## 2016-04-16 ENCOUNTER — Other Ambulatory Visit (HOSPITAL_COMMUNITY): Payer: Self-pay | Admitting: Obstetrics and Gynecology

## 2016-04-16 ENCOUNTER — Ambulatory Visit
Admission: RE | Admit: 2016-04-16 | Discharge: 2016-04-16 | Disposition: A | Discharge status: Home / Self Care | Payer: No Typology Code available for payment source | Source: Ambulatory Visit | Attending: Obstetrics and Gynecology | Admitting: Obstetrics and Gynecology

## 2016-04-16 DIAGNOSIS — N6320 Unspecified lump in the left breast, unspecified quadrant: Secondary | ICD-10-CM

## 2016-04-16 MED FILL — LEVOTHYROXINE 75 MCG TABLET: 75 | 30 days supply | Qty: 30 | Fill #3

## 2016-04-26 ENCOUNTER — Ambulatory Visit: Payer: No Typology Code available for payment source | Attending: Internal Medicine

## 2016-05-08 ENCOUNTER — Telehealth: Payer: Self-pay | Admitting: Family Medicine

## 2016-05-08 DIAGNOSIS — Z0000 Encounter for general adult medical examination without abnormal findings: Secondary | ICD-10-CM

## 2016-05-08 NOTE — Telephone Encounter (Signed)
Patient called the office to inform PCP that she had to miss her appointment with the dentist (referral) because her OC was expired. Pt has renewed it (OC) and wants to know if another referral can be placed. Please follow up.  Thank you.

## 2016-05-09 NOTE — Telephone Encounter (Signed)
Dental referral placed Patient will be contacted

## 2016-05-09 NOTE — Telephone Encounter (Signed)
Will route to PCP 

## 2016-05-16 ENCOUNTER — Encounter: Payer: Self-pay | Admitting: Podiatry

## 2016-05-16 ENCOUNTER — Ambulatory Visit: Payer: No Typology Code available for payment source

## 2016-05-16 ENCOUNTER — Ambulatory Visit (INDEPENDENT_AMBULATORY_CARE_PROVIDER_SITE_OTHER): Payer: No Typology Code available for payment source | Admitting: Podiatry

## 2016-05-16 VITALS — BP 157/90 | HR 82 | Resp 16 | Ht 61.0 in | Wt 164.0 lb

## 2016-05-16 DIAGNOSIS — M722 Plantar fascial fibromatosis: Secondary | ICD-10-CM

## 2016-05-16 NOTE — Patient Instructions (Signed)

## 2016-05-16 NOTE — Progress Notes (Signed)
   Subjective:    Patient ID: Alexandra Brewer, female    DOB: September 18, 1955, 61 y.o.   MRN: [id number]  HPI Chief Complaint  Patient presents with  . Foot Pain    Bilateral; bottom of heel; pt stated, "Feet hurt all day long"; x1 yr      Review of Systems  All other systems reviewed and are negative.      Objective:   Physical Exam        Assessment & Plan:

## 2016-05-17 NOTE — Progress Notes (Signed)
Subjective:     Patient ID: Alexandra Brewer, female   DOB: Aug 21, 1955, 61 y.o.   MRN: [id number]  HPI patient presents heel pain bilateral   Review of Systems     Objective:   Physical Exam Neurovascular status intact with exquisite discomfort heel bilateral    Assessment:     Plantar fasciitis bilateral    Plan:     Injected plantar fascia 3 Milligan Kenalog 5 mill grams Xylocaine utilized supportive shoes and over-the-counter insoles

## 2016-05-31 ENCOUNTER — Ambulatory Visit: Payer: No Typology Code available for payment source | Admitting: Family Medicine

## 2016-06-11 ENCOUNTER — Ambulatory Visit: Payer: No Typology Code available for payment source | Admitting: Family Medicine

## 2016-06-12 ENCOUNTER — Other Ambulatory Visit: Payer: Self-pay | Admitting: Family Medicine

## 2016-06-12 DIAGNOSIS — E039 Hypothyroidism, unspecified: Secondary | ICD-10-CM

## 2016-06-12 DIAGNOSIS — E038 Other specified hypothyroidism: Secondary | ICD-10-CM

## 2016-06-14 ENCOUNTER — Ambulatory Visit: Payer: No Typology Code available for payment source

## 2016-07-05 ENCOUNTER — Encounter: Payer: Self-pay | Admitting: Family Medicine

## 2016-07-05 ENCOUNTER — Ambulatory Visit: Payer: Self-pay | Attending: Family Medicine | Admitting: Family Medicine

## 2016-07-05 VITALS — BP 158/87 | HR 72 | Temp 98.0°F | Resp 16 | Wt 169.6 lb

## 2016-07-05 DIAGNOSIS — I10 Essential (primary) hypertension: Secondary | ICD-10-CM | POA: Insufficient documentation

## 2016-07-05 DIAGNOSIS — K0889 Other specified disorders of teeth and supporting structures: Secondary | ICD-10-CM | POA: Insufficient documentation

## 2016-07-05 DIAGNOSIS — E039 Hypothyroidism, unspecified: Secondary | ICD-10-CM | POA: Insufficient documentation

## 2016-07-05 DIAGNOSIS — M19011 Primary osteoarthritis, right shoulder: Secondary | ICD-10-CM | POA: Insufficient documentation

## 2016-07-05 DIAGNOSIS — Z79899 Other long term (current) drug therapy: Secondary | ICD-10-CM | POA: Insufficient documentation

## 2016-07-05 MED ORDER — MELOXICAM 15 MG PO TABS: 15.0000 mg | ORAL_TABLET | Freq: Every day | ORAL | 2 refills | Status: DC

## 2016-07-05 MED ORDER — LEVOTHYROXINE SODIUM 75 MCG PO TABS: 75.0000 ug | ORAL_TABLET | Freq: Every day | ORAL | 3 refills | Status: DC

## 2016-07-05 MED ORDER — AMLODIPINE BESYLATE 2.5 MG PO TABS: 5.0000 mg | ORAL_TABLET | Freq: Every day | ORAL | 2 refills | Status: DC

## 2016-07-05 MED ORDER — DICLOFENAC SODIUM 1 % TD GEL: 4.0000 g | Freq: Four times a day (QID) | TRANSDERMAL | 2 refills | Status: DC

## 2016-07-05 MED FILL — LEVOTHYROXINE 75 MCG TABLET: 75 | 30 days supply | Qty: 30 | Fill #0

## 2016-07-05 MED FILL — AMLODIPINE BESYLATE 2.5 MG: 2.5 | 15 days supply | Qty: 30 | Fill #0

## 2016-07-05 MED FILL — ?MELOXICAM 15 MG TAB: 15 MG | 30 days supply | Qty: 30 | Fill #0

## 2016-07-05 MED FILL — VOLTAREN 1% GEL: 1 | 6 days supply | Qty: 100 | Fill #0

## 2016-07-05 NOTE — Progress Notes (Signed)
Pt is in the office today for a f/u on hypothyroidism and arm pain Pt states her pain level today in the office is a 6 Pt states she doesn't feel the pain until she is done working  Pt states she feels a warm hot feeling and numbness in her arm

## 2016-07-05 NOTE — Progress Notes (Signed)
Subjective:  Patient ID: Alexandra Brewer, female    DOB: [birth year]  Age: 61 y.o. MRN: [id number]  CC: Hypothyroidism and Arm Pain   HPI Kelaiah Escalona presents for  [employer] interpreter [place] ID # [id number]  9. F/u hypothyroidism: she is taking synthroid every morning. She has aches in R arm and both heels.   2. Elevated blood pressure: pressure is elevated today. She reports that she is feeling a little dizzy at times for the past month, 1-2 times per week. No CP, SOB or leg swelling. Her blood pressure has   3. R arm pain: for many years, over 2 years. Pain in upper arm in lateral shoulder. Pain is associated with sensation.  She works as a Electrical engineer for [employer]. She is R handed. No loss of grip strength. She took meloxicam which helps for 1-2 days but then pain returns. Pain is worsened when she raises her arm above her head.   Social History  Substance Use Topics  . Smoking status: Never Smoker  . Smokeless tobacco: Never Used  . Alcohol use No    Outpatient Medications Prior to Visit  Medication Sig Dispense Refill  . atorvastatin (LIPITOR) 20 MG tablet Take 1 tablet (20 mg total) by mouth daily. Take every evening at dinner. 30 tablet 11  . Elastic Bandages & Supports (MEDICAL COMPRESSION STOCKINGS) MISC 25-30 mm Hg 2 each 0  . levothyroxine (SYNTHROID, LEVOTHROID) 75 MCG tablet TAKE 1 TABLET BY MOUTH DAILY. 30 tablet 3  . meloxicam (MOBIC) 15 MG tablet Take 1 tablet (15 mg total) by mouth daily. (Patient not taking: Reported on 07/05/2016) 30 tablet 2  . NON FORMULARY OTC allergy pill     No facility-administered medications prior to visit.     ROS Review of Systems  Constitutional: Negative for chills and fever.  Eyes: Negative for visual disturbance.  Respiratory: Negative for shortness of breath.   Cardiovascular: Negative for chest pain.  Gastrointestinal: Positive for constipation. Negative for abdominal pain, blood in stool, diarrhea, nausea,  rectal pain and vomiting.  Musculoskeletal: Negative for arthralgias and back pain.  Skin: Negative for rash.  Allergic/Immunologic: Negative for immunocompromised state.  Hematological: Negative for adenopathy. Does not bruise/bleed easily.  Psychiatric/Behavioral: Negative for dysphoric mood and suicidal ideas.    Objective:  BP (!) 158/87   Pulse 72   Temp 98 F (36.7 C) (Oral)   Resp 16   Wt 169 lb 9.6 oz (76.9 kg)   SpO2 98%   BMI 32.05 kg/m   BP/Weight 07/05/2016 1/47/8295 09/02/1306  Systolic BP 657 846 962  Diastolic BP 87 90 72  Wt. (Lbs) 169.6 164 160.4  BMI 32.05 30.99 31.33    Physical Exam  Constitutional: She is oriented to person, place, and time. She appears well-developed and well-nourished. No distress.  HENT:  Head: Normocephalic and atraumatic.  Mouth/Throat: Dental caries present.  Cardiovascular: Normal rate, regular rhythm, normal heart sounds and intact distal pulses.   Pulmonary/Chest: Effort normal and breath sounds normal.  Abdominal: Soft. Bowel sounds are normal. She exhibits no distension and no mass. There is no tenderness. There is no rebound and no guarding.  Musculoskeletal: She exhibits no edema.       Right shoulder: She exhibits tenderness and pain. She exhibits normal range of motion, no bony tenderness, no swelling, no effusion, no crepitus, no deformity, no laceration, no spasm, normal pulse and normal strength.       Arms: Neurological: She is alert  and oriented to person, place, and time.  Skin: Skin is warm and dry. No rash noted.  Psychiatric: She has a normal mood and affect.    Lab Results  Component Value Date   TSH 2.87 03/07/2016     Assessment & Plan:  Lanaysia was seen today for hypothyroidism and arm pain.  Diagnoses and all orders for this visit:  Hypothyroidism, unspecified type -     TSH -     levothyroxine (SYNTHROID, LEVOTHROID) 75 MCG tablet; Take 1 tablet (75 mcg total) by mouth daily.  Arthritis of right  shoulder region -     DG Shoulder Right; Future -     meloxicam (MOBIC) 15 MG tablet; Take 1 tablet (15 mg total) by mouth daily. -     diclofenac sodium (VOLTAREN) 1 % GEL; Apply 4 g topically 4 (four) times daily.  Pain, dental -     Ambulatory referral to Dentistry  Hypertension, unspecified type -     CMP14+EGFR -     amLODipine (NORVASC) 2.5 MG tablet; Take 2 tablets (5 mg total) by mouth daily.   There are no diagnoses linked to this encounter. Patient filled out mammogram scholarship form, form faxed   No orders of the defined types were placed in this encounter.   Follow-up: Return in about 4 weeks (around 08/02/2016) for HTN and R shoulder pain .   Boykin Nearing MD

## 2016-07-05 NOTE — Patient Instructions (Addendum)
Alexandra Brewer was seen today for hypothyroidism and arm pain.  Diagnoses and all orders for this visit:  Hypothyroidism, unspecified type -     TSH -     levothyroxine (SYNTHROID, LEVOTHROID) 75 MCG tablet; Take 1 tablet (75 mcg total) by mouth daily.  Arthritis of right shoulder region -     DG Shoulder Right; Future -     meloxicam (MOBIC) 15 MG tablet; Take 1 tablet (15 mg total) by mouth daily.  Pain, dental -     Ambulatory referral to Dentistry  Hypertension, unspecified type -     CMP14+EGFR -     amLODipine (NORVASC) 2.5 MG tablet; Take 2 tablets (5 mg total) by mouth daily.   Shoulder x-ray Please go to [facility] on first floor  f/u in 4 weeks for shoulder pain and HTN   Dr. Adrian Blackwater    Hipertensin Hypertension El trmino hipertensin es otra forma de denominar a la presin arterial elevada. La presin arterial elevada fuerza al corazn a trabajar ms para bombear la sangre. Esto puede causar problemas con el paso del [place]. Una lectura de presin arterial est compuesta por 2 nmeros. Hay un nmero superior (sistlico) sobre un nmero inferior (diastlico). Lo ideal es tener la presin arterial por debajo de 120/80. Las decisiones saludables pueden ayudarle a disminuir su presin arterial. Es posible que necesite medicamentos que le ayuden a disminuir su presin arterial si:  Su presin arterial no disminuye mediante decisiones saludables.  Su presin arterial est por encima de 130/80. Siga estas instrucciones en su casa: Comida y bebida   Si se lo indican, siga el plan de alimentacin de DASH (Dietary Approaches to Stop Hypertension, Maneras de alimentarse para detener la hipertensin). Esta dieta incluye:  Que la mitad del plato de cada comida sea de frutas y verduras.  Que un cuarto del plato de cada comida sea de cereales integrales. Los cereales integrales incluyen pasta integral, arroz integral y pan integral.  Comer y beber productos lcteos con bajo  contenido de [place], como leche descremada o yogur bajo en grasas.  Que un cuarto del plato de cada comida sea de protenas bajas en grasa [place]). Las protenas bajas en grasa incluyen pescado, pollo sin piel, huevos, frijoles y tofu.  Evitar consumir carne grasa, carne curada y procesada, o pollo con piel.  Evitar consumir alimentos prehechos o procesados.  Consuma menos de 1500 mg de sal (sodio) por da.  Limite el consumo de alcohol a no ms de 1 medida por da si es mujer y no est Music therapist y a 2 medidas por da si es hombre. Una medida equivale a 12onzas de cerveza, 5onzas de vino o 1onzas de bebidas alcohlicas de alta graduacin. Estilo de vida   Trabaje con su mdico para mantenerse en un peso saludable o para perder peso. Pregntele a su mdico cul es el peso recomendable para usted.  Realice al menos 30 minutos de ejercicio que haga que se acelere su corazn (ejercicio Arboriculturist) la [employer] de la [place]. Estos pueden incluir caminar, nadar o andar en bicicleta.  Realice al menos 30 minutos de ejercicio que fortalezca sus msculos (ejercicios de resistencia) al menos 3 das a la [place]. Estos pueden incluir levantar pesas o hacer pilates.  No consuma ningn producto que contenga nicotina o tabaco. Esto incluye cigarrillos y cigarrillos electrnicos. Si necesita ayuda para dejar de fumar, consulte al mdico.  Controle su presin arterial en su casa tal como le indic el mdico.  Concurra a todas las visitas de control como se lo haya indicado el mdico. Esto es importante. Medicamentos   [employer] de venta libre y los recetados solamente como se lo haya indicado el mdico. Siga cuidadosamente las indicaciones.  No omita las dosis de medicamentos para la presin arterial. Los medicamentos pierden eficacia si omite dosis. El hecho de omitir las dosis tambin [place] el riesgo de otros problemas.  Pregntele a su mdico a qu efectos secundarios o  reacciones a los Careers information officer. Comunquese con un mdico si:  Piensa que tiene [place] reaccin a los medicamentos que est tomando.  Tiene dolores de cabeza frecuentes (recurrentes).  Siente mareos.  Tiene hinchazn en los tobillos.  Tiene problemas de visin. Solicite ayuda de inmediato si:  Siente un dolor de cabeza muy intenso.  Comienza a sentirse confundido.  Se siente dbil o adormecido.  Siente que va a desmayarse.  Siente un dolor muy intenso en:  El pecho.  El vientre (abdomen).  Devuelve (vomita) ms de una vez.  Tiene dificultad para respirar. Resumen  El trmino hipertensin es otra forma de denominar a la presin arterial elevada.  Las decisiones saludables pueden ayudarle a disminuir su presin arterial. Si no puede controlar su presin arterial mediante decisiones saludables, es posible que deba tomar medicamentos. Esta informacin no tiene Marine scientist el consejo del mdico. Asegrese de hacerle al mdico cualquier pregunta que tenga. Document Released: 09/06/2009 Document Revised: 02/29/2016 Document Reviewed: 02/29/2016 Elsevier Interactive Patient Education  2017 [employer].

## 2016-07-05 NOTE — Assessment & Plan Note (Addendum)
Checking TSH today Continue synthroid Lab Results  Component Value Date   TSH 6.760 (H) 07/05/2016   Increase synthroid from 75 to 88 mcg daily

## 2016-07-05 NOTE — Assessment & Plan Note (Signed)
A: HTN for past several months P: Start norvasc 2.5 mg daily CMP, lipids Low sodium diet

## 2016-07-05 NOTE — Assessment & Plan Note (Signed)
A; persistent lateral shoulder pain P; Continue mobic Add voltaren gel Shoulder x-ray

## 2016-07-06 LAB — CMP14+EGFR
A/G RATIO: 1.5 (ref 1.2–2.2)
ALT: 17 IU/L (ref 0–32)
AST: 17 IU/L (ref 0–40)
Albumin: 4.4 g/dL (ref 3.5–5.5)
Alkaline Phosphatase: 103 IU/L (ref 39–117)
BUN/Creatinine Ratio: 16 (ref 9–23)
BUN: 11 mg/dL (ref 6–24)
CALCIUM: 9.4 mg/dL (ref 8.7–10.2)
CHLORIDE: 100 mmol/L (ref 96–106)
CO2: 26 mmol/L (ref 18–29)
Creatinine, Ser: 0.68 mg/dL (ref 0.57–1.00)
GFR calc Af Amer: 111 mL/min/{1.73_m2} (ref >59)
GFR calc non Af Amer: 96 mL/min/{1.73_m2} (ref >59)
GLOBULIN, TOTAL: 3 g/dL (ref 1.5–4.5)
Glucose: 99 mg/dL (ref 65–99)
POTASSIUM: 4.3 mmol/L (ref 3.5–5.2)
SODIUM: 142 mmol/L (ref 134–144)
Total Protein: 7.4 g/dL (ref 6.0–8.5)

## 2016-07-06 LAB — TSH: TSH: 6.76 u[IU]/mL — AB (ref 0.450–4.500)

## 2016-07-09 ENCOUNTER — Ambulatory Visit (HOSPITAL_COMMUNITY)
Admission: RE | Admit: 2016-07-09 | Discharge: 2016-07-09 | Disposition: A | Discharge status: Home / Self Care | Payer: Self-pay | Source: Ambulatory Visit | Attending: Family Medicine | Admitting: Family Medicine

## 2016-07-09 DIAGNOSIS — M19011 Primary osteoarthritis, right shoulder: Secondary | ICD-10-CM | POA: Insufficient documentation

## 2016-07-10 ENCOUNTER — Telehealth: Payer: Self-pay

## 2016-07-10 NOTE — Telephone Encounter (Signed)
CMA call patient to go over x ray results  Patient did not answer but left a detailed message & if have any questions just to call back

## 2016-07-10 NOTE — Telephone Encounter (Signed)
-----   Message from Boykin Nearing, MD sent at 07/10/2016 11:50 AM EDT ----- Mild arthritis in R shoulder Otherwise normal x-ray

## 2016-07-12 MED ORDER — LEVOTHYROXINE SODIUM 88 MCG PO TABS: 88.0000 ug | ORAL_TABLET | Freq: Every day | ORAL | 2 refills | Status: DC

## 2016-07-12 NOTE — Addendum Note (Signed)
Addended by: Boykin Nearing on: 07/12/2016 03:34 PM   Modules accepted: Orders

## 2016-07-25 ENCOUNTER — Telehealth: Payer: Self-pay

## 2016-07-25 NOTE — Telephone Encounter (Signed)
Pt was called and informed of lab results. Mardene Celeste [phone]

## 2016-07-30 ENCOUNTER — Ambulatory Visit: Payer: Self-pay | Attending: Family Medicine

## 2016-08-02 ENCOUNTER — Ambulatory Visit: Payer: Self-pay | Admitting: Family Medicine

## 2016-08-14 ENCOUNTER — Telehealth: Payer: Self-pay

## 2016-08-14 ENCOUNTER — Encounter: Payer: Self-pay | Admitting: Family Medicine

## 2016-08-14 ENCOUNTER — Ambulatory Visit: Payer: Self-pay | Attending: Family Medicine | Admitting: Family Medicine

## 2016-08-14 VITALS — BP 118/68 | HR 76 | Temp 98.2°F | Ht 61.0 in | Wt 163.2 lb

## 2016-08-14 DIAGNOSIS — M79672 Pain in left foot: Secondary | ICD-10-CM | POA: Insufficient documentation

## 2016-08-14 DIAGNOSIS — M79671 Pain in right foot: Secondary | ICD-10-CM | POA: Insufficient documentation

## 2016-08-14 DIAGNOSIS — M19011 Primary osteoarthritis, right shoulder: Secondary | ICD-10-CM | POA: Insufficient documentation

## 2016-08-14 DIAGNOSIS — M7661 Achilles tendinitis, right leg: Secondary | ICD-10-CM | POA: Insufficient documentation

## 2016-08-14 DIAGNOSIS — I10 Essential (primary) hypertension: Secondary | ICD-10-CM | POA: Insufficient documentation

## 2016-08-14 DIAGNOSIS — Z79899 Other long term (current) drug therapy: Secondary | ICD-10-CM | POA: Insufficient documentation

## 2016-08-14 DIAGNOSIS — E034 Atrophy of thyroid (acquired): Secondary | ICD-10-CM | POA: Insufficient documentation

## 2016-08-14 MED ORDER — TRAMADOL HCL 50 MG PO TABS: 50.0000 mg | ORAL_TABLET | Freq: Every evening | ORAL | 0 refills | Status: DC | PRN

## 2016-08-14 MED ORDER — AMLODIPINE BESYLATE 2.5 MG PO TABS: 2.5000 mg | ORAL_TABLET | Freq: Every day | ORAL | 11 refills | Status: DC

## 2016-08-14 MED FILL — traMADol HCL 50 MG TABS: 50 | 30 days supply | Qty: 30 | Fill #0

## 2016-08-14 MED FILL — AMLODIPINE BESYLATE 2.5 MG: 2.5 | 30 days supply | Qty: 30 | Fill #0

## 2016-08-14 NOTE — Telephone Encounter (Signed)
Error

## 2016-08-14 NOTE — Progress Notes (Signed)
Subjective:  Patient ID: Alexandra Brewer, female    DOB: [birth year]  Age: 61 y.o. MRN: [id number]  CC: Hypertension and Shoulder Pain   HPI Deneshia Zucker presents for  Stratus interpreter [place] ID # [id number]  1. F/u hypothyroidism: she is taking synthroid 75 mcg daily. She did not start the 88 mcg daily synthroid dose.   2. HTN: she is taking Norvasc 2.5 mg daily. She denies HA, chest pains and shortness of breath.   3. R shoulder pain: for many years, over 2 years. Pain in upper arm in lateral shoulder. Pain is associated with sensation.  She works as a Electrical engineer for [employer]. She is R handed. No loss of grip strength. She took meloxicam which helps for 1-2 days but then pain returns. Pain is worsened when she raises her arm above her head.   R shoulder x-ray 07/09/2016: IMPRESSION: Mild degenerative changes in the right AC joint. No acute bony abnormality.  4.  R achilles tendon pain: started 8 days ago. Pain in posterior ankle radiating up to posterior knee. No swelling. No trauma. She has history of plantar fascitis but denies foot pain.   Social History  Substance Use Topics  . Smoking status: Never Smoker  . Smokeless tobacco: Never Used  . Alcohol use No    Outpatient Medications Prior to Visit  Medication Sig Dispense Refill  . amLODipine (NORVASC) 2.5 MG tablet Take 2 tablets (5 mg total) by mouth daily. 30 tablet 2  . atorvastatin (LIPITOR) 20 MG tablet Take 1 tablet (20 mg total) by mouth daily. Take every evening at dinner. 30 tablet 11  . diclofenac sodium (VOLTAREN) 1 % GEL Apply 4 g topically 4 (four) times daily. 100 g 2  . Elastic Bandages & Supports (MEDICAL COMPRESSION STOCKINGS) MISC 25-30 mm Hg 2 each 0  . levothyroxine (SYNTHROID, LEVOTHROID) 88 MCG tablet Take 1 tablet (88 mcg total) by mouth daily. 30 tablet 2  . meloxicam (MOBIC) 15 MG tablet Take 1 tablet (15 mg total) by mouth daily. 30 tablet 2  . NON FORMULARY OTC allergy pill      No facility-administered medications prior to visit.     ROS Review of Systems  Constitutional: Negative for chills and fever.  Eyes: Negative for visual disturbance.  Respiratory: Negative for shortness of breath.   Cardiovascular: Negative for chest pain.  Gastrointestinal: Positive for constipation. Negative for abdominal pain, blood in stool, diarrhea, nausea, rectal pain and vomiting.  Musculoskeletal: Negative for arthralgias and back pain.  Skin: Negative for rash.  Allergic/Immunologic: Negative for immunocompromised state.  Hematological: Negative for adenopathy. Does not bruise/bleed easily.  Psychiatric/Behavioral: Negative for dysphoric mood and suicidal ideas.    Objective:  BP 118/68   Pulse 76   Temp 98.2 F (36.8 C) (Oral)   Ht 5\' 1"  (1.549 m)   Wt 163 lb 3.2 oz (74 kg)   SpO2 98%   BMI 30.84 kg/m   BP/Weight 08/14/2016 07/05/2016 09/01/930  Systolic BP 355 732 202  Diastolic BP 68 87 90  Wt. (Lbs) 163.2 169.6 164  BMI 30.84 32.05 30.99    Physical Exam  Constitutional: She is oriented to person, place, and time. She appears well-developed and well-nourished. No distress.  HENT:  Head: Normocephalic and atraumatic.  Mouth/Throat: Dental caries present.  Cardiovascular: Normal rate, regular rhythm, normal heart sounds and intact distal pulses.   Pulmonary/Chest: Effort normal and breath sounds normal.  Abdominal: Soft. Bowel sounds are normal. She  exhibits no distension and no mass. There is no tenderness. There is no rebound and no guarding.  Musculoskeletal: She exhibits no edema.       Right shoulder: She exhibits tenderness and pain. She exhibits normal range of motion, no bony tenderness, no swelling, no effusion, no crepitus, no deformity, no laceration, no spasm, normal pulse and normal strength.       Right ankle: Achilles tendon exhibits pain. Achilles tendon exhibits no defect.       Arms:      Right foot: Normal.  Neurological: She is alert  and oriented to person, place, and time.  Skin: Skin is warm and dry. No rash noted.  Psychiatric: She has a normal mood and affect.    Lab Results  Component Value Date   TSH 6.760 (H) 07/05/2016    Assessment & Plan:  Luvada was seen today for hypertension and shoulder pain.  Diagnoses and all orders for this visit:  Hypertension, unspecified type -     amLODipine (NORVASC) 2.5 MG tablet; Take 1 tablet (2.5 mg total) by mouth daily.  Heel pain, bilateral  Arthritis of right shoulder region -     traMADol (ULTRAM) 50 MG tablet; Take 1 tablet (50 mg total) by mouth at bedtime as needed.  Hypothyroidism due to acquired atrophy of thyroid  Right Achilles tendinitis -     traMADol (ULTRAM) 50 MG tablet; Take 1 tablet (50 mg total) by mouth at bedtime as needed.   There are no diagnoses linked to this encounter. Patient filled out mammogram scholarship form, form faxed   No orders of the defined types were placed in this encounter.   Follow-up: Return in about 1 month (around 09/14/2016) for body pains .   Boykin Nearing MD

## 2016-08-14 NOTE — Patient Instructions (Addendum)
Alexandra Brewer was seen today for hypertension and shoulder pain.  Diagnoses and all orders for this visit:  Hypertension, unspecified type -     amLODipine (NORVASC) 2.5 MG tablet; Take 1 tablet (2.5 mg total) by mouth daily.  Heel pain, bilateral -     traMADol (ULTRAM) 50 MG tablet; Take 1 tablet (50 mg total) by mouth at bedtime as needed.  Arthritis of right shoulder region -     traMADol (ULTRAM) 50 MG tablet; Take 1 tablet (50 mg total) by mouth at bedtime as needed.    F/u in one month for R shoulder, bilaterals knee and R achilles tendon pain   Dr. Adrian Blackwater    Rotura del tendn de [place] con rehabilitacin faseI Achilles Tendon Rupture, Phase I Rehab Consulte al mdico qu ejercicios son seguros para usted. Haga los ejercicios exactamente como se lo haya indicado el mdico y gradelos como se lo hayan indicado. Es normal sentir tirantez, tensin, presin o molestias leves mientras hace estos ejercicios, pero debe detenerse de inmediato si siente dolor repentino o si el dolor empeora.No comience a hacer estos ejercicios hasta que se lo indique el mdico. EJERCICIOS DE [employer] Y AMPLITUD DE MOVIMIENTOS  Estos ejercicios calientan los msculos y las articulaciones, y mejoran la movilidad y la flexibilidad de la parte inferior de la pierna y del tobillo. Estos ejercicios tambin ayudan a Best boy, el adormecimiento y el hormigueo.  EjercicioA: Dorsiflexin y flexin plantar, amplitud de movimientos activos  1. Sintese con la rodilla izquierda/derecha extendida o flexionada. No apoye el pie sobre ninguna superficie. 2. Flexione el tobillo izquierdo/derecho para Environmental manager superior del pie hacia su canilla. Detngase en el primer punto de resistencia o en el ngulo que le haya indicado su mdico. 3. Mantenga esta posicin durante ___________ segundos. 4. Apunte los dedos hacia abajo para inclinar la parte superior del pie en direccin contraria a su canilla. 5. Mantenga  esta posicin durante ___________ segundos. Repita __________ veces. Realice este ejercicio __________ veces al da.  EjercicioB: Abecedario con el tobillo  1. Sintese con la pierna izquierda/derecha apoyada sobre la otra pierna.  No apoye el pie sobre ninguna superficie.  Asegrese de tener suficiente espacio para mover el pie. 1. Imagine que el pie izquierdo/derecho es un pincel y [place] para trazar cada letra del abecedario en el aire. No mueva la cadera ni el tobillo mientras traza las [place]. 2. Trace cada una de las [place] del abecedario, de la A a la Z. Repita __________ veces. Realice este ejercicio __________ veces al da. EJERCICIOS DE FORTALECIMIENTO  Estos ejercicios fortalecen la parte inferior de la pierna y le otorgan resistencia. La resistencia es la capacidad de usar los msculos durante un tiempo prolongado, incluso despus de que se cansen.  EjercicioC: Dorsiflexin  1. Asegure Allayne Stack de goma para ejercicios a un objeto, como la pata de [place], que no se mueva al tirar de la banda. Colquese el otro extremo alrededor del pie izquierdo/derecho. 2. Sintese en el suelo, frente al objeto, con la pierna izquierda/derecha extendida y los dedos apuntando [place]. La banda debe estar ligeramente tensa cuando el pie est relajado. 3. Flexione lentamente los dedos y el tobillo izquierdo/derecho para Pharmacist, community hacia usted. Detngase cuando sienta resistencia en el tendn de [place] o en el ngulo que le haya indicado su mdico. 4. Mantenga esta posicin durante ___________ segundos. 5. Lentamente, lleve el pie a la posicin inicial. Repita __________ veces. Realice este ejercicio  __________ veces al da. EjercicioD: Eversin  1. Sintese en el suelo con las piernas extendidas hacia adelante. 2. Pase una banda de goma para ejercicios por detrs del pie izquierdo/derecho, alrededor de la parte superior de la superficie que se [place] para caminar (regin  metatarsiana). [place] a un objeto estable. 3. Empuje lentamente hacia afuera con el pie, en direccin contraria a la otra pierna. 4. Mantenga esta posicin durante ___________ segundos. 5. Vuelva lentamente a la posicin inicial. Repita __________ veces. Realice este ejercicio __________ veces al da. EjercicioE: Inversin  1. Sintese en el suelo con las piernas extendidas hacia adelante. 2. Pase una banda de goma para ejercicios por detrs del pie izquierdo/derecho, alrededor de la parte superior de la superficie que se [place] para caminar (regin metatarsiana). [place] a un objeto estable. 3. Empuje lentamente hacia adentro con el pie, en direccin a la otra pierna. 4. Mantenga esta posicin durante ___________ segundos. 5. Lentamente, lleve el pie a la posicin inicial. Repita __________ veces. Realice este ejercicio __________ veces al da.  EjercicioF: Flexin plantar, sentado  1. Sintese en una silla con los pies apoyados en el suelo. 2. Si se lo indic el mdico, coloque un peso de __________ lb [employer] rodilla izquierda/derecha. 3. Con los dedos de los pies en el suelo, levante el taln izquierdo/derecho tanto como pueda sin sentir molestias en el tobillo. 4. Vuelva a apoyar el taln en el suelo. Repita __________ veces. Realice este ejercicio __________ veces al da. Esta informacin no tiene Marine scientist el consejo del mdico. Asegrese de hacerle al mdico cualquier pregunta que tenga. Document Released: 01/03/2006 Document Revised: 08/03/2014 Document Reviewed: 02/09/2015 Elsevier Interactive Patient Education  2017 [employer].

## 2016-08-15 ENCOUNTER — Encounter: Payer: Self-pay | Admitting: Family Medicine

## 2016-08-15 DIAGNOSIS — M7661 Achilles tendinitis, right leg: Secondary | ICD-10-CM | POA: Insufficient documentation

## 2016-08-15 NOTE — Assessment & Plan Note (Signed)
A: hypothyroidism P: Patient to start increase synthroid dose of 88 mcg daily

## 2016-08-15 NOTE — Assessment & Plan Note (Signed)
A: x-ray consistent with arthritis P: Continue mobic, NSAID Add tramadol at nighttime Treat hypothyroidism with increase dose of synthroid

## 2016-08-15 NOTE — Assessment & Plan Note (Signed)
Well controlled.  Continue Norvasc 2.5 mg daily

## 2016-08-15 NOTE — Assessment & Plan Note (Signed)
A; acute pain. No injury. P: mobic tramadol Ice

## 2016-08-31 MED FILL — ?LEVOTHYROXINE 88 MCG TAB: 88 | 30 days supply | Qty: 30 | Fill #0

## 2016-10-09 MED FILL — ?LEVOTHYROXINE 88 MCG TAB: 88 | 30 days supply | Qty: 30 | Fill #1

## 2016-10-24 ENCOUNTER — Ambulatory Visit: Payer: Self-pay

## 2016-10-25 ENCOUNTER — Ambulatory Visit (INDEPENDENT_AMBULATORY_CARE_PROVIDER_SITE_OTHER): Payer: No Typology Code available for payment source | Admitting: Podiatry

## 2016-10-25 ENCOUNTER — Encounter: Payer: Self-pay | Admitting: Podiatry

## 2016-10-25 VITALS — BP 136/84 | HR 77 | Resp 16 | Ht 60.0 in | Wt 155.0 lb

## 2016-10-25 DIAGNOSIS — M722 Plantar fascial fibromatosis: Secondary | ICD-10-CM

## 2016-10-25 NOTE — Progress Notes (Signed)
Subjective:    Patient ID: Alexandra Brewer, female   DOB: 61 y.o.   MRN: [id number]   HPI patient presents with heel pain bilateral    ROS      Objective:  Physical Exam neurovascular status found to be intact with exquisite discomfort plantar heel region bilateral     Assessment:    Acute plantar fasciitis bilateral     Plan:    H&P performed education rendered and injected the plantar fascia bilateral 3 mg Kenalog 5 mill grams Xylocaine and instructed on physical therapy

## 2016-10-25 NOTE — Progress Notes (Signed)
   Subjective:    Patient ID: Alexandra Brewer, female    DOB: 1956-02-11, 61 y.o.   MRN: [id number]  HPI Chief Complaint  Patient presents with  . Foot Pain    Bilateral; bottom of heel; x1 month      Review of Systems  All other systems reviewed and are negative.      Objective:   Physical Exam        Assessment & Plan:

## 2016-10-25 NOTE — Patient Instructions (Signed)

## 2016-11-07 ENCOUNTER — Ambulatory Visit: Payer: Self-pay | Attending: Family Medicine

## 2016-11-26 ENCOUNTER — Ambulatory Visit (INDEPENDENT_AMBULATORY_CARE_PROVIDER_SITE_OTHER): Payer: Self-pay | Admitting: Nurse Practitioner

## 2016-11-26 ENCOUNTER — Encounter: Payer: Self-pay | Admitting: Nurse Practitioner

## 2016-11-26 VITALS — BP 136/84 | HR 79 | Temp 97.6°F | Ht 61.0 in | Wt 158.0 lb

## 2016-11-26 DIAGNOSIS — K5909 Other constipation: Secondary | ICD-10-CM

## 2016-11-26 DIAGNOSIS — K219 Gastro-esophageal reflux disease without esophagitis: Secondary | ICD-10-CM | POA: Insufficient documentation

## 2016-11-26 DIAGNOSIS — E034 Atrophy of thyroid (acquired): Secondary | ICD-10-CM

## 2016-11-26 MED ORDER — OMEPRAZOLE 20 MG PO CPDR: 20.0000 mg | DELAYED_RELEASE_CAPSULE | Freq: Every day | ORAL | 3 refills | Status: DC

## 2016-11-26 MED ORDER — LINACLOTIDE 290 MCG PO CAPS: 290.0000 ug | ORAL_CAPSULE | Freq: Every day | ORAL | 0 refills | Status: DC

## 2016-11-26 MED FILL — OMEPRAZOLE DR 20 MG CAPSULE: 20 | 30 days supply | Qty: 30 | Fill #0

## 2016-11-26 NOTE — Progress Notes (Signed)
cc'ed to pcp °

## 2016-11-26 NOTE — Patient Instructions (Signed)
1. Have your blood test done when you can. 2. Call your regular, primary care doctor and make an appointment to follow-up on your thyroid. 3. I am giving you samples of Linzess 290 g. Take 1 pill, once a day, on an empty stomach. 4. Continue using Benefiber. 5. I sent in a prescription for omeprazole 20 mg. Also take this once a day on an empty stomach. 6. Call us in 1-2 weeks and let us know if the medicine is working. 7. Return to our office in 2 months.   1. Haga su anlisis de sangre cuando pueda. 2. Llame a su mdico de atencin primaria habitual y programe una cita para el seguimiento de su tiroides. 3. Le estoy dando muestras de Linzess 290 ?g. [place] 1 pldora, una vez al da, con el estmago vaco. 4. [employer]. 5. Le envi una receta de omeprazol 20 mg. Tambin tome esto una vez al da con el estmago vaco. 6. Llmenos en 1-2 semanas y avsenos si el medicamento est funcionando. 7. Regrese a nuestra oficina en 2 meses.

## 2016-11-26 NOTE — Progress Notes (Signed)
Referring Provider: Boykin Nearing, MD Primary Care Physician:  Boykin Nearing, MD Primary GI:  Dr. Gala Romney  Chief Complaint  Patient presents with  . Abdominal Pain    right side  . Gastroesophageal Reflux  . Constipation  . Bloated    HPI:   Alexandra Brewer is a 61 y.o. female who presents for follow-up on abdominal pain. The patient was last seen in our office 09/27/2015 for constipation and to schedule screening colonoscopy. Noted chronic constipation for 5 years, never had colonoscopy before. Has tried MiraLAX without relief. GoLYTELY did help. Noted dry hard stools. Her last TSH was elevated at 7.56 in May 2017 and she was started on Synthroid. Her visit was assisted by a translator and at the time of her last visit she continued to note constipation and straining, minimal abdominal pain. Also noted bloating. Colonoscopy was arranged and she was started on Linzess 145 g daily. Requested call back in 2 weeks with a progress report (which did not occur), follow-up in 3 months. She was a no-show for next 2 appointments with [place].  Colonoscopy was completed 10/10/2015 which found a single 3 mm polyp in the cecum, diverticulosis in the entire examined colon, internal hemorrhoids. Recommended continue present medications. Surgical pathology found the polyp to be benign. Recommended 10 year repeat colonoscopy.  Today's visit was assisted by an Research officer, political party. Today she states she's ok overall. She was given samples of Linzess 145 mcg but stopped taking them. States they helped some. Is currently using Benefiber 2 tbsp twice daily which previously helped but has stopped helping as much. GERD symptoms are worsening about a month ago, has never been on medication for reflux. She has abdominal pain mid-upper abdomen and right-sided abdomen. Abdominal pain improves. Denies hematochezia, melena, fever, chills, unintentional weight loss, acute changes in bowel movements. Denies chest pain,  dyspnea, dizziness, lightheadedness, syncope, near syncope. Denies any other upper or lower GI symptoms.  Past Medical History:  Diagnosis Date  . Asthma   . Constipation   . Hyperlipemia   . Thyroid disease     Past Surgical History:  Procedure Laterality Date  . CATARACT EXTRACTION    . COLONOSCOPY N/A 10/10/2015   Procedure: COLONOSCOPY;  Surgeon: Daneil Dolin, MD;  Location: [facility];  Service: Endoscopy;  Laterality: N/A;  245 - Interpreter scheduled, do NOT move    Current Outpatient Prescriptions  Medication Sig Dispense Refill  . amLODipine (NORVASC) 2.5 MG tablet Take 1 tablet (2.5 mg total) by mouth daily. (Patient taking differently: Take 2.5 mg by mouth as needed. Takes if she feels dizzy) 30 tablet 11  . cetirizine (ZYRTEC) 10 MG tablet Take 10 mg by mouth daily.    Water engineer Bandages & Supports (MEDICAL COMPRESSION STOCKINGS) MISC 25-30 mm Hg 2 each 0  . levothyroxine (SYNTHROID, LEVOTHROID) 88 MCG tablet Take 1 tablet (88 mcg total) by mouth daily. 30 tablet 2  . Wheat Dextrin (BENEFIBER) POWD Take by mouth 2 (two) times daily. Takes 2 tbsp twice daily    . linaclotide (LINZESS) 290 MCG CAPS capsule Take 1 capsule (290 mcg total) by mouth daily before breakfast. 16 capsule 0  . omeprazole (PRILOSEC) 20 MG capsule Take 1 capsule (20 mg total) by mouth daily. 90 capsule 3   No current facility-administered medications for this visit.     Allergies as of 11/26/2016 - Review Complete 11/26/2016  Allergen Reaction Noted  . Penicillins  07/20/2015    Family History  Problem Relation Age of Onset  . Diabetes Father   . Hypertension Father   . Stroke Maternal Grandmother   . Stroke Maternal Grandfather   . Colon cancer Neg Hx     Social History   Social History  . Marital status: Single    Spouse name: N/A  . Number of children: N/A  . Years of education: N/A   Social History Main Topics  . Smoking status: Never Smoker  . Smokeless tobacco: Never  Used  . Alcohol use No  . Drug use: No  . Sexual activity: Not Currently   Other Topics Concern  . None   Social History Narrative  . None    Review of Systems: General: Negative for anorexia, weight loss, fever, chills, fatigue, weakness. ENT: Negative for hoarseness, difficulty swallowing. CV: Negative for chest pain, angina, palpitations, peripheral edema.  Respiratory: Negative for dyspnea at rest, cough, sputum, wheezing.  GI: See history of present illness. Endo: Negative for unusual weight change.  Heme: Negative for bruising or bleeding. Allergy: Negative for rash or hives.   Physical Exam: BP 136/84   Pulse 79   Temp 97.6 F (36.4 C) (Oral)   Ht 5\' 1"  (1.549 m)   Wt 158 lb (71.7 kg)   BMI 29.85 kg/m  General:   Alert and oriented. Pleasant and cooperative. Well-nourished and well-developed.  Eyes:  Without icterus, sclera clear and conjunctiva pink.  Ears:  Normal auditory acuity. Cardiovascular:  S1, S2 present without murmurs appreciated. Extremities without clubbing or edema. Respiratory:  Clear to auscultation bilaterally. No wheezes, rales, or rhonchi. No distress.  Gastrointestinal:  +BS, soft, and non-distended. Mild right-sided abdominal TTP. No HSM noted. No guarding or rebound. No masses appreciated.  Rectal:  Deferred  Musculoskalatal:  Symmetrical without gross deformities. Neurologic:  Alert and oriented x4;  grossly normal neurologically. Psych:  Alert and cooperative. Normal mood and affect. Heme/Lymph/Immune: No excessive bruising noted.    11/27/2016 8:47 AM   Disclaimer: This note was dictated with voice recognition software. Similar sounding words can inadvertently be transcribed and may not be corrected upon review.

## 2016-11-27 LAB — TSH: TSH: 4.55 m[IU]/L — AB

## 2016-11-27 NOTE — Assessment & Plan Note (Signed)
She has a history of hypothyroidism which could be impacting her constipation. Her last TSH was high and her dose of Synthroid was increased. This was 4 months ago when she does not have a current appointment scheduled for the future. I have ordered a TSH check her levels and recommend she call primary care to have a follow-up office visit for evaluation and dose titration. She states she will do so.

## 2016-11-27 NOTE — Assessment & Plan Note (Signed)
Persisting constipation, Benefiber provide some relief. She was previously on Linzess 145 g which helped some, but not enough. She ran out of samples that didn't call our office to ask for follow-up appointment or further medication. Today I'll trial her on Linzess 290 g once a day. She will have a family member who is English-speaking call our office in 1-2 weeks and let us know if it is helping. If not, we can try other constipation options. I also recommend she continue to take Benefiber twice a day. Return for follow-up in 2 months.

## 2016-11-27 NOTE — Assessment & Plan Note (Signed)
The patient has dyspepsia symptoms consistent with GERD. She is not currently on a PPI. I started her on omeprazole 20 mg daily. She will have a family member who speaks English provided with hip right in order to call us in 1-2 weeks and let us know if it is helping. Return for follow-up in 2 months.

## 2016-12-12 ENCOUNTER — Encounter: Payer: Self-pay | Admitting: Family Medicine

## 2016-12-12 ENCOUNTER — Ambulatory Visit: Payer: Self-pay | Attending: Family Medicine | Admitting: Family Medicine

## 2016-12-12 VITALS — BP 103/68 | HR 72 | Temp 98.8°F | Resp 18 | Ht 60.0 in | Wt 162.4 lb

## 2016-12-12 DIAGNOSIS — Z13220 Encounter for screening for lipoid disorders: Secondary | ICD-10-CM | POA: Insufficient documentation

## 2016-12-12 DIAGNOSIS — I10 Essential (primary) hypertension: Secondary | ICD-10-CM | POA: Insufficient documentation

## 2016-12-12 DIAGNOSIS — Z23 Encounter for immunization: Secondary | ICD-10-CM | POA: Insufficient documentation

## 2016-12-12 DIAGNOSIS — Z79899 Other long term (current) drug therapy: Secondary | ICD-10-CM | POA: Insufficient documentation

## 2016-12-12 DIAGNOSIS — E039 Hypothyroidism, unspecified: Secondary | ICD-10-CM | POA: Insufficient documentation

## 2016-12-12 MED ORDER — LEVOTHYROXINE SODIUM 112 MCG PO TABS: 112.0000 ug | ORAL_TABLET | Freq: Every day | ORAL | 0 refills | Status: DC

## 2016-12-12 MED FILL — ?LEVOTHYROXINE 112 MCG TAB: 112 | 60 days supply | Qty: 60 | Fill #0

## 2016-12-12 NOTE — Progress Notes (Signed)
Subjective:  Patient ID: Alexandra Brewer, female    DOB: May 24, 1955  Age: 61 y.o. MRN: [id number]  CC: Establish Care   HPI Nevena Rozenberg presents for follow up for HTN and hypothyroidism. She denies any  denies fatigue, weight changes, heat/cold intolerance, bowel/skin changes or CVS symptoms. The problem has been stable.  Previous thyroid studies include TSH and T3 uptake. History of  hypertension: Patient here for follow-up of elevated blood pressure. She is not exercising and is not adherent to low salt diet.  She does not check BP at home. Cardiac symptoms none. Patient denies chest pain, chest pressure/discomfort, claudication, dyspnea, lower extremity edema, near-syncope, palpitations and syncope.  Cardiovascular risk factors: hypertension and sedentary lifestyle. Use of agents associated with hypertension: none. History of target organ damage: none.   Outpatient Medications Prior to Visit  Medication Sig Dispense Refill  . cetirizine (ZYRTEC) 10 MG tablet Take 10 mg by mouth daily.    Marland Kitchen levothyroxine (SYNTHROID, LEVOTHROID) 88 MCG tablet Take 1 tablet (88 mcg total) by mouth daily. 30 tablet 2  . Elastic Bandages & Supports (MEDICAL COMPRESSION STOCKINGS) MISC 25-30 mm Hg 2 each 0  . linaclotide (LINZESS) 290 MCG CAPS capsule Take 1 capsule (290 mcg total) by mouth daily before breakfast. 16 capsule 0  . omeprazole (PRILOSEC) 20 MG capsule Take 1 capsule (20 mg total) by mouth daily. 90 capsule 3  . Wheat Dextrin (BENEFIBER) POWD Take by mouth 2 (two) times daily. Takes 2 tbsp twice daily    . amLODipine (NORVASC) 2.5 MG tablet Take 1 tablet (2.5 mg total) by mouth daily. (Patient taking differently: Take 2.5 mg by mouth as needed. Takes if she feels dizzy) 30 tablet 11   No facility-administered medications prior to visit.     ROS Review of Systems  Constitutional: Negative.   Eyes: Negative.   Respiratory: Negative.   Cardiovascular: Negative.   Gastrointestinal:  Negative.   Endocrine: Negative.   Skin: Negative.   Neurological: Negative.    Objective:  BP 103/68 (BP Location: Left Arm, Patient Position: Sitting, Cuff Size: Normal)   Pulse 72   Temp 98.8 F (37.1 C) (Oral)   Resp 18   Ht 5' (1.524 m)   Wt 162 lb 6.4 oz (73.7 kg)   SpO2 98%   BMI 31.72 kg/m   BP/Weight 12/12/2016 11/26/2016 9/38/1829  Systolic BP 937 169 678  Diastolic BP 68 84 84  Wt. (Lbs) 162.4 158 155  BMI 31.72 29.85 30.27    Physical Exam  Constitutional: She appears well-developed and well-nourished.  Eyes: Pupils are equal, round, and reactive to light. Conjunctivae are normal.  Neck: Normal range of motion. Neck supple. No JVD present.  Cardiovascular: Normal rate, regular rhythm, normal heart sounds and intact distal pulses.   Pulmonary/Chest: Effort normal and breath sounds normal.  Abdominal: Soft. Bowel sounds are normal. There is no tenderness.  Lymphadenopathy:    She has no cervical adenopathy.  Skin: Skin is warm and dry.  Psychiatric: She has a normal mood and affect.  Nursing note and vitals reviewed.  Assessment & Plan:   1. Screening cholesterol level  - Lipid Panel  2. Acquired hypothyroidism  - levothyroxine (SYNTHROID, LEVOTHROID) 112 MCG tablet; Take 1 tablet (112 mcg total) by mouth daily.  Dispense: 60 tablet; Refill: 0  3. Essential hypertension  - CMP and Liver - Lipid Panel - Microalbumin/Creatinine Ratio, Urine  4. Needs flu shot  - Flu Vaccine QUAD 6+ mos PF IM (  Fluarix Quad PF)   Meds ordered this encounter  Medications  . levothyroxine (SYNTHROID, LEVOTHROID) 112 MCG tablet    Sig: Take 1 tablet (112 mcg total) by mouth daily.    Dispense:  60 tablet    Refill:  0    Order Specific Question:   Supervising Provider    Answer:   Tresa Garter [[id number]]    Follow-up: Return in about 2 months (around 02/11/2017) for Thyroid/ HTN.   Alfonse Spruce FNP

## 2016-12-12 NOTE — Progress Notes (Signed)
Patient is here for establish care / thyroids

## 2016-12-12 NOTE — Patient Instructions (Signed)
Hipotiroidismo (Hypothyroidism) El hipotiroidismo es un trastorno de la tiroides, una glndula grande ubicada en la parte anterior e inferior del cuello. La tiroides Administrator hormonas que controlan el funcionamiento del [place]. En los casos de hipotiroidismo, la glndula no produce la cantidad suficiente de estas hormonas. CAUSAS Las causas del hipotiroidismo pueden incluir lo siguiente:  Infecciones virales.  Embarazo.  Un ataque del sistema de defensa (sistema inmunitario) a la tiroides.  Algunos medicamentos.  Defectos de nacimiento.  Radioterapias anteriores en la cabeza o el cuello.  Tratamiento previo con yodo radioactivo.  Extirpacin quirrgica previa de una parte o de toda la tiroides.  Problemas con la glndula ubicada en el centro del cerebro (hipfisis). [place] signos y los sntomas de hipotiroidismo pueden ser los siguientes:  Sensacin de falta de Teacher, early years/pre ([place]).  Incapacidad para tolerar el fro.  Aumento de peso que no puede explicarse por un cambio en la dieta o en los hbitos de ejercicio fsico.  Piel seca.  Pelo grueso.  Irregularidades menstruales.  Ralentizacin de los procesos de pensamiento.  Estreimiento.  Tristeza o depresin. DIAGNSTICO El mdico puede diagnosticar el hipotiroidismo con anlisis de sangre y ecografas. TRATAMIENTO El hipotiroidismo se trata con medicamentos que reemplazan las hormonas que el cuerpo no produce. Despus de Biochemist, clinical, pueden pasar varias semanas hasta la desaparicin de los sntomas. [place] los medicamentos solamente como se lo haya indicado el mdico.  Si empieza a tomar medicamentos nuevos, infrmele al mdico.  Concurra a todas las visitas de control como se lo haya indicado el mdico. Esto es importante. A medida que la enfermedad mejora, es posible que haya que modificar las dosis. Tendr que hacerse anlisis de sangre  peridicamente, de modo que el mdico pueda controlar la enfermedad.  SOLICITE ATENCIN MDICA SI:  Los sntomas no mejoran con Dispensing optician.  Est tomando medicamentos sustitutivos de la tiroides y: ? Copywriter, advertising. ? Siente temblores. ? Est ansioso. ? [place] rpidamente. ? No puede Agricultural engineer. ? Tiene cambios emocionales. ? Tiene diarrea. ? Se siente dbil.  SOLICITE ATENCIN MDICA DE INMEDIATO SI:  Siente dolor en el pecho.  Tiene latidos cardacos irregulares o siente dolor en el pecho.  Nota que la frecuencia cardaca est acelerada.  Esta informacin no tiene Marine scientist el consejo del mdico. Asegrese de hacerle al mdico cualquier pregunta que tenga. Document Released: 03/19/2005 Document Revised: 04/09/2014 Document Reviewed: 08/04/2013 Elsevier Interactive Patient Education  2017 [employer].

## 2016-12-13 LAB — CMP AND LIVER
ALT: 16 IU/L (ref 0–32)
AST: 22 IU/L (ref 0–40)
Albumin: 4.5 g/dL (ref 3.5–5.5)
Alkaline Phosphatase: 113 IU/L (ref 39–117)
BUN: 10 mg/dL (ref 6–24)
Bilirubin Total: <0.2 mg/dL (ref 0.0–1.2)
Bilirubin, Direct: 0.06 mg/dL (ref 0.00–0.40)
CO2: 26 mmol/L (ref 20–29)
Calcium: 9.9 mg/dL (ref 8.7–10.2)
Chloride: 99 mmol/L (ref 96–106)
Creatinine, Ser: 0.77 mg/dL (ref 0.57–1.00)
GFR calc Af Amer: 98 mL/min/{1.73_m2} (ref >59)
GFR calc non Af Amer: 85 mL/min/{1.73_m2} (ref >59)
Glucose: 83 mg/dL (ref 65–99)
Potassium: 4.5 mmol/L (ref 3.5–5.2)
Sodium: 140 mmol/L (ref 134–144)
Total Protein: 7.6 g/dL (ref 6.0–8.5)

## 2016-12-13 LAB — LIPID PANEL
Chol/HDL Ratio: 4.2 ratio (ref 0.0–4.4)
Cholesterol, Total: 211 mg/dL — ABNORMAL HIGH (ref 100–199)
HDL: 50 mg/dL (ref >39)
LDL Calculated: 117 mg/dL — ABNORMAL HIGH (ref 0–99)
Triglycerides: 222 mg/dL — ABNORMAL HIGH (ref 0–149)
VLDL Cholesterol Cal: 44 mg/dL — ABNORMAL HIGH (ref 5–40)

## 2016-12-18 ENCOUNTER — Other Ambulatory Visit: Payer: Self-pay | Admitting: Family Medicine

## 2016-12-18 DIAGNOSIS — E782 Mixed hyperlipidemia: Secondary | ICD-10-CM

## 2016-12-18 MED ORDER — ATORVASTATIN CALCIUM 20 MG PO TABS: 20.0000 mg | ORAL_TABLET | Freq: Every day | ORAL | 2 refills | Status: DC

## 2016-12-18 MED FILL — ATORVASTATIN 20 MG TABLET: 20 | 30 days supply | Qty: 30 | Fill #0

## 2016-12-19 ENCOUNTER — Telehealth: Payer: Self-pay

## 2016-12-19 NOTE — Telephone Encounter (Signed)
CMA call regarding lab results   Patient did not answer but left a VM stating the reason of the call & to call back  

## 2016-12-19 NOTE — Telephone Encounter (Signed)
-----   Message from Alfonse Spruce, [place] sent at 12/18/2016  4:11 PM EDT ----- Kidney function normal Liver function normal Lipid levels were elevated. This can increase your risk of heart disease overtime. You will be prescribed atorvastatin to help treat. Start eating a diet low in saturated fat. Limit your intake of fried foods, red meats, and whole milk. Increase activity Recommend recheck in 3 months. Marland Kitchen

## 2016-12-20 ENCOUNTER — Telehealth: Payer: Self-pay | Admitting: Family Medicine

## 2016-12-20 NOTE — Telephone Encounter (Signed)
Patient called back to review results, pt would like a call back after 4pm if possible due to work schedule

## 2016-12-20 NOTE — Telephone Encounter (Signed)
CMA call regarding lab results   Patient verify DOB  Patient was aware and understood  

## 2017-01-23 ENCOUNTER — Ambulatory Visit (INDEPENDENT_AMBULATORY_CARE_PROVIDER_SITE_OTHER): Payer: Self-pay | Admitting: Nurse Practitioner

## 2017-01-23 ENCOUNTER — Encounter: Payer: Self-pay | Admitting: Nurse Practitioner

## 2017-01-23 VITALS — BP 142/80 | HR 74 | Temp 97.5°F | Ht 60.0 in | Wt 159.4 lb

## 2017-01-23 DIAGNOSIS — K219 Gastro-esophageal reflux disease without esophagitis: Secondary | ICD-10-CM

## 2017-01-23 DIAGNOSIS — R1011 Right upper quadrant pain: Secondary | ICD-10-CM | POA: Insufficient documentation

## 2017-01-23 DIAGNOSIS — K5909 Other constipation: Secondary | ICD-10-CM

## 2017-01-23 NOTE — Patient Instructions (Signed)
English: 1. We will help schedule your ultrasound for you. 2. Keep taking your medications. 3. Follow-up in our office in 3 months. 4. Call us if you have any questions or concerns.  Spanish: 1. Le ayudaremos a programar su ultrasonido para usted. 2. Sigue tomando tus medicamentos. 3. Seguimiento en nuestra oficina en 3 meses. 4. Llmenos si tiene alguna pregunta o inquietud.

## 2017-01-23 NOTE — Assessment & Plan Note (Signed)
Patient describes right side pain but on exam she tends to have tenderness in the right upper quadrant, not severe but mild. At this point I will check a right upper quadrant ultrasound to rule out biliary etiology. Her labs appeared to be normal thus far. We will call her with the results. Return for follow-up in 3 months. Continue other medications otherwise.

## 2017-01-23 NOTE — Assessment & Plan Note (Signed)
GERD symptoms doing quite well on PPI. Recommend she continue PPI, return for follow-up in 3 months.

## 2017-01-23 NOTE — Assessment & Plan Note (Signed)
Constipation much improved. No longer on Linzess. However, her Synthroid dose was increased and this is likely having and affect. Linzess cause diarrhea and she subsequently stop it. Having daily bowel movements which are soft, easy to pass, no blood noted. Return for follow-up in 3 months.

## 2017-01-23 NOTE — Progress Notes (Signed)
Referring Provider: Boykin Nearing, MD Primary Care Physician:  Alfonse Spruce, FNP Primary GI:  Dr. Gala Romney  Chief Complaint  Patient presents with  . Abdominal Pain    only when she eats spicy food  . Gastroesophageal Reflux    better  . Constipation    stopped linzess d/t causing diarrhea  . Bloated    very sledom    HPI:   Alexandra Brewer is a 61 y.o. female who presents For follow-up on GERD, constipation, abdominal pain. The patient was last seen in our office 11/26/2016 for chronic constipation, GERD. Her TSH level was also checked at her last office visit when she was on 88 g a day. Her TSH level was found to be elevated at 4.55. At her most recent PCP visit they increased her Synthroid dose to 112 g daily.  Her last visit was assisted by a Optometrist. Doing okay overall, stop taking Linzess due to diarrhea and was using Benefiber 2 teaspoons twice a day but stopped helping. GERD symptoms worsened about a month prior, no other GI symptoms. Colonoscopy on file 10/2015 recommended repeat in 10 years. Recommended check TSH, Linzess 290 g, omeprazole 20 mg daily, progress report in 1-2 weeks. A progress report was not called our office.  Today's visit is assisted by a Chief Executive Officer. Today she states she's doing much better. Stopped Linzess after 2 weeks due to diarrhea. Has a bowel movement once a day, regular, notes complete emptying. Denies abdominal pain unless she eats spicy food, seafood. Seafood also causes bloating. GERD resolved unless she eats spicy foods. Denies N/V. Does still have right far-sided abdominal discomfort, intermittent, described as "inflammed." Worse when she presses on it. States it's more "annoying, nagging" but "not really PAIN." Denies chest pain, dyspnea, dizziness, lightheadedness, syncope, near syncope. Denies any other upper or lower GI symptoms.  Past Medical History:  Diagnosis Date  . Asthma   . Constipation   .  Hyperlipemia   . Thyroid disease     Past Surgical History:  Procedure Laterality Date  . CATARACT EXTRACTION    . COLONOSCOPY N/A 10/10/2015   Procedure: COLONOSCOPY;  Surgeon: Daneil Dolin, MD;  Location: [facility];  Service: Endoscopy;  Laterality: N/A;  245 - Interpreter scheduled, do NOT move    Current Outpatient Prescriptions  Medication Sig Dispense Refill  . atorvastatin (LIPITOR) 20 MG tablet Take 1 tablet (20 mg total) by mouth daily. 30 tablet 2  . cetirizine (ZYRTEC) 10 MG tablet Take 10 mg by mouth daily.    Water engineer Bandages & Supports (MEDICAL COMPRESSION STOCKINGS) MISC 25-30 mm Hg 2 each 0  . levothyroxine (SYNTHROID, LEVOTHROID) 112 MCG tablet Take 1 tablet (112 mcg total) by mouth daily. 60 tablet 0  . omeprazole (PRILOSEC) 20 MG capsule Take 1 capsule (20 mg total) by mouth daily. 90 capsule 3  . Wheat Dextrin (BENEFIBER) POWD Take by mouth 2 (two) times daily. Takes 2 tbsp twice daily     No current facility-administered medications for this visit.     Allergies as of 01/23/2017 - Review Complete 01/23/2017  Allergen Reaction Noted  . Penicillins  07/20/2015    Family History  Problem Relation Age of Onset  . Diabetes Father   . Hypertension Father   . Stroke Maternal Grandmother   . Stroke Maternal Grandfather   . Colon cancer Neg Hx     Social History   Social History  . Marital status: Single  Spouse name: N/A  . Number of children: N/A  . Years of education: N/A   Social History Main Topics  . Smoking status: Never Smoker  . Smokeless tobacco: Never Used  . Alcohol use No  . Drug use: No  . Sexual activity: Not Currently   Other Topics Concern  . None   Social History Narrative  . None    Review of Systems: General: Negative for anorexia, weight loss, fever, chills, fatigue, weakness. ENT: Negative for hoarseness, difficulty swallowing. CV: Negative for chest pain, angina, palpitations, peripheral edema.  Respiratory:  Negative for dyspnea at rest, cough, sputum, wheezing.  GI: See history of present illness. Endo: Negative for unusual weight change.  Heme: Negative for bruising or bleeding.  Physical Exam: BP (!) 142/80   Pulse 74   Temp (!) 97.5 F (36.4 C) (Oral)   Ht 5' (1.524 m)   Wt 159 lb 6.4 oz (72.3 kg)   BMI 31.13 kg/m  General:   Alert and oriented. Pleasant and cooperative. Well-nourished and well-developed.  Head:  Normocephalic and atraumatic. Eyes:  Without icterus, sclera clear and conjunctiva pink.  Ears:  Normal auditory acuity. Cardiovascular:  S1, S2 present without murmurs appreciated. Extremities without clubbing or edema. Respiratory:  Clear to auscultation bilaterally. No wheezes, rales, or rhonchi. No distress.  Gastrointestinal:  +BS, soft, and non-distended. Some RUQ TTP. No HSM noted. No guarding or rebound. No masses appreciated.  Rectal:  Deferred  Musculoskalatal:  Symmetrical without gross deformities. Neurologic:  Alert and oriented x4;  grossly normal neurologically. Psych:  Alert and cooperative. Normal mood and affect. Heme/Lymph/Immune: No excessive bruising noted.    01/23/2017 4:14 PM   Disclaimer: This note was dictated with voice recognition software. Similar sounding words can inadvertently be transcribed and may not be corrected upon review.

## 2017-01-24 ENCOUNTER — Encounter: Payer: Self-pay | Admitting: Nurse Practitioner

## 2017-01-28 ENCOUNTER — Ambulatory Visit (HOSPITAL_COMMUNITY): Payer: Self-pay

## 2017-02-04 ENCOUNTER — Ambulatory Visit (HOSPITAL_COMMUNITY)
Admission: RE | Admit: 2017-02-04 | Discharge: 2017-02-04 | Disposition: A | Discharge status: Home / Self Care | Payer: Self-pay | Source: Ambulatory Visit | Attending: Nurse Practitioner | Admitting: Nurse Practitioner

## 2017-02-04 DIAGNOSIS — K5909 Other constipation: Secondary | ICD-10-CM | POA: Insufficient documentation

## 2017-02-04 DIAGNOSIS — K219 Gastro-esophageal reflux disease without esophagitis: Secondary | ICD-10-CM | POA: Insufficient documentation

## 2017-02-04 DIAGNOSIS — R1011 Right upper quadrant pain: Secondary | ICD-10-CM | POA: Insufficient documentation

## 2017-02-26 ENCOUNTER — Telehealth: Payer: Self-pay | Admitting: Family Medicine

## 2017-02-26 DIAGNOSIS — E039 Hypothyroidism, unspecified: Secondary | ICD-10-CM

## 2017-02-26 MED ORDER — LEVOTHYROXINE SODIUM 112 MCG PO TABS: 112.0000 ug | ORAL_TABLET | Freq: Every day | ORAL | 0 refills | Status: DC

## 2017-02-26 MED FILL — MELOXICAM 15 MG TABLET: 15 | 30 days supply | Qty: 30 | Fill #1

## 2017-02-26 MED FILL — ?LEVOTHYROXINE 112 MCG TAB: 112 | 30 days supply | Qty: 30 | Fill #0

## 2017-02-26 NOTE — Telephone Encounter (Signed)
Will refill levothyroxine to last until scheduled visit with PCP. Will defer meloxicam refill to PCP since it is not a current medication on med list.

## 2017-02-26 NOTE — Addendum Note (Signed)
Addended by: Rica Mast on: 02/26/2017 04:30 PM   Modules accepted: Orders

## 2017-02-26 NOTE — Telephone Encounter (Signed)
Pt called to request a refill on  -levothyroxine (SYNTHROID, LEVOTHROID) 112 MCG tablet  -meloxicam 15 mg tablet Please follow up

## 2017-02-28 NOTE — Telephone Encounter (Signed)
Meloxicam will not be refilled without office visit.

## 2017-03-07 ENCOUNTER — Ambulatory Visit: Payer: Self-pay | Attending: Family Medicine

## 2017-03-08 ENCOUNTER — Ambulatory Visit: Payer: Self-pay | Admitting: Family Medicine

## 2017-03-18 ENCOUNTER — Ambulatory Visit: Payer: Self-pay | Admitting: Family Medicine

## 2017-04-09 ENCOUNTER — Encounter: Payer: Self-pay | Admitting: Family Medicine

## 2017-04-09 ENCOUNTER — Ambulatory Visit: Payer: Self-pay | Attending: Family Medicine | Admitting: Family Medicine

## 2017-04-09 VITALS — BP 147/80 | HR 66 | Temp 97.8°F | Resp 16 | Ht 60.63 in | Wt 161.0 lb

## 2017-04-09 DIAGNOSIS — G8929 Other chronic pain: Secondary | ICD-10-CM

## 2017-04-09 DIAGNOSIS — M1990 Unspecified osteoarthritis, unspecified site: Secondary | ICD-10-CM

## 2017-04-09 DIAGNOSIS — E039 Hypothyroidism, unspecified: Secondary | ICD-10-CM

## 2017-04-09 DIAGNOSIS — M25511 Pain in right shoulder: Secondary | ICD-10-CM

## 2017-04-09 DIAGNOSIS — M25611 Stiffness of right shoulder, not elsewhere classified: Secondary | ICD-10-CM

## 2017-04-09 DIAGNOSIS — I10 Essential (primary) hypertension: Secondary | ICD-10-CM

## 2017-04-09 MED ORDER — ACETAMINOPHEN 500 MG PO TABS: 1000.0000 mg | ORAL_TABLET | Freq: Three times a day (TID) | ORAL | 1 refills | Status: DC | PRN

## 2017-04-09 MED ORDER — TRAMADOL HCL 50 MG PO TABS: 50.0000 mg | ORAL_TABLET | Freq: Three times a day (TID) | ORAL | 0 refills | Status: DC | PRN

## 2017-04-09 NOTE — Progress Notes (Signed)
Subjective:  Patient ID: Alexandra Brewer, female    DOB: September 06, 1955  Age: 62 y.o. MRN: [id number]  CC: Hypothyroidism   HPI Alexandra Brewer presents for follow up for HTN and hypothyroidism. She denies any symptoms. The problem has been stable.  Previous thyroid studies include TSH and T3 uptake. History of  hypertension: Patient here for follow-up of elevated blood pressure. She is not exercising and is not adherent to low salt diet.  She does not check BP at home. Cardiac symptoms none. Patient denies chest pain, chest pressure/discomfort, claudication, dyspnea, lower extremity edema, near-syncope, palpitations and syncope.  Cardiovascular risk factors: hypertension and sedentary lifestyle. Use of agents associated with hypertension: none. History of target organ damage: none. Arthralgias: Onset 1 year ago to bilateral arms and legs. She denies any history of known injury, swelling of the joints. Previous workup has included xray. Treatments : NSAID and tramadol.     Outpatient Medications Prior to Visit  Medication Sig Dispense Refill  . atorvastatin (LIPITOR) 20 MG tablet Take 1 tablet (20 mg total) by mouth daily. 30 tablet 2  . cetirizine (ZYRTEC) 10 MG tablet Take 10 mg by mouth daily.    Water engineer Bandages & Supports (MEDICAL COMPRESSION STOCKINGS) MISC 25-30 mm Hg 2 each 0  . omeprazole (PRILOSEC) 20 MG capsule Take 1 capsule (20 mg total) by mouth daily. 90 capsule 3  . Wheat Dextrin (BENEFIBER) POWD Take by mouth 2 (two) times daily. Takes 2 tbsp twice daily    . levothyroxine (SYNTHROID, LEVOTHROID) 112 MCG tablet Take 1 tablet (112 mcg total) by mouth daily. 30 tablet 0   No facility-administered medications prior to visit.     Review of Systems  Constitutional: Negative.   Eyes: Negative.   Respiratory: Negative.   Cardiovascular: Negative.   Musculoskeletal: Positive for joint pain.  Psychiatric/Behavioral: Negative.    Objective:  BP (!) 147/80 (BP Location:  Left Arm, Patient Position: Sitting, Cuff Size: Normal)   Pulse 66   Temp 97.8 F (36.6 C) (Oral)   Resp 16   Ht 5' 0.63" (1.54 m)   Wt 161 lb (73 kg)   SpO2 100%   BMI 30.79 kg/m   BP/Weight 04/09/2017 01/23/2017 5/64/3329  Systolic BP 518 841 660  Diastolic BP 80 80 68  Wt. (Lbs) 161 159.4 162.4  BMI 30.79 31.13 31.72    Physical Exam  Vitals reviewed. Constitutional: She appears well-developed and well-nourished.  HENT:  Head: Normocephalic and atraumatic.  Right Ear: External ear normal.  Left Ear: External ear normal.  Nose: Nose normal.  Mouth/Throat: Oropharynx is clear and moist.  Neck: Normal range of motion. Neck supple.  Cardiovascular: Normal rate, regular rhythm, normal heart sounds and intact distal pulses.  Respiratory: Effort normal and breath sounds normal.  GI: Soft. Bowel sounds are normal.  Musculoskeletal:       Right shoulder: She exhibits decreased range of motion and pain.    Assessment & Plan:   1. Hypothyroidism, unspecified type  - T4 AND TSH  2. Hypertension, unspecified type -Will order amlodipine.  -Plan for BP recheck in office.  3. Chronic arthritis  - traMADol (ULTRAM) 50 MG tablet; Take 1 tablet (50 mg total) by mouth every 8 (eight) hours as needed for severe pain.  Dispense: 40 tablet; Refill: 0 - acetaminophen (TYLENOL) 500 MG tablet; Take 2 tablets (1,000 mg total) by mouth every 8 (eight) hours as needed for mild pain or moderate pain.  Dispense: 30 tablet; Refill:  1  4. Chronic right shoulder pain   - traMADol (ULTRAM) 50 MG tablet; Take 1 tablet (50 mg total) by mouth every 8 (eight) hours as needed for severe pain.  Dispense: 40 tablet; Refill: 0 - acetaminophen (TYLENOL) 500 MG tablet; Take 2 tablets (1,000 mg total) by mouth every 8 (eight) hours as needed for mild pain or moderate pain.  Dispense: 30 tablet; Refill: 1 - Ambulatory referral to Orthopedics  5. Decreased ROM of right shoulder   - Ambulatory referral  to Orthopedics      Follow-up: Return in about 2 months (around 06/07/2017), or if symptoms worsen or fail to improve, for Hypothyroidism.   Alfonse Spruce FNP

## 2017-04-09 NOTE — Progress Notes (Signed)
FH Hypothyroidism Requesting medicine refills -(Zyrtec Tramadol Levothyroxine) Stated still with body ache and heal pain Pain scale 8 No tobacco ETOH user

## 2017-04-09 NOTE — Patient Instructions (Signed)
Hypothyroidism Hypothyroidism is a disorder of the thyroid. The thyroid is a large gland that is located in the lower front of the neck. The thyroid releases hormones that control how the body works. With hypothyroidism, the thyroid does not make enough of these hormones. What are the causes? Causes of hypothyroidism may include:  Viral infections.  Pregnancy.  Your own defense system (immune system) attacking your thyroid.  Certain medicines.  Birth defects.  Past radiation treatments to your head or neck.  Past treatment with radioactive iodine.  Past surgical removal of part or all of your thyroid.  Problems with the gland that is located in the center of your brain (pituitary).  What are the signs or symptoms? Signs and symptoms of hypothyroidism may include:  Feeling as though you have no energy (lethargy).  Inability to tolerate cold.  Weight gain that is not explained by a change in diet or exercise habits.  Dry skin.  Coarse hair.  Menstrual irregularity.  Slowing of thought processes.  Constipation.  Sadness or depression.  How is this diagnosed? Your health care provider may diagnose hypothyroidism with blood tests and ultrasound tests. How is this treated? Hypothyroidism is treated with medicine that replaces the hormones that your body does not make. After you begin treatment, it may take several weeks for symptoms to go away. Follow these instructions at home:  Take medicines only as directed by your health care provider.  If you start taking any new medicines, tell your health care provider.  Keep all follow-up visits as directed by your health care provider. This is important. As your condition improves, your dosage needs may change. You will need to have blood tests regularly so that your health care provider can watch your condition. Contact a health care provider if:  Your symptoms do not get better with treatment.  You are taking thyroid  replacement medicine and: ? You sweat excessively. ? You have tremors. ? You feel anxious. ? You lose weight rapidly. ? You cannot tolerate heat. ? You have emotional swings. ? You have diarrhea. ? You feel weak. Get help right away if:  You develop chest pain.  You develop an irregular heartbeat.  You develop a rapid heartbeat. This information is not intended to replace advice given to you by your health care provider. Make sure you discuss any questions you have with your health care provider. Document Released: 03/19/2005 Document Revised: 08/25/2015 Document Reviewed: 08/04/2013 Elsevier Interactive Patient Education  2018 [employer] Inc[employer]  

## 2017-04-10 ENCOUNTER — Other Ambulatory Visit: Payer: Self-pay | Admitting: Family Medicine

## 2017-04-10 DIAGNOSIS — E039 Hypothyroidism, unspecified: Secondary | ICD-10-CM

## 2017-04-10 LAB — T4 AND TSH
T4 TOTAL: 6.2 ug/dL (ref 4.5–12.0)
TSH: 0.927 u[IU]/mL (ref 0.450–4.500)

## 2017-04-10 MED ORDER — LEVOTHYROXINE SODIUM 112 MCG PO TABS: 112.0000 ug | ORAL_TABLET | Freq: Every day | ORAL | 2 refills | Status: DC

## 2017-04-10 MED FILL — ?LEVOTHYROXINE 112 MCG TAB: 112 | 30 days supply | Qty: 30 | Fill #0

## 2017-04-11 MED FILL — traMADol HCL 50 MG TABS: 50 | 13 days supply | Qty: 40 | Fill #0

## 2017-04-14 ENCOUNTER — Other Ambulatory Visit: Payer: Self-pay | Admitting: Family Medicine

## 2017-04-14 DIAGNOSIS — I10 Essential (primary) hypertension: Secondary | ICD-10-CM

## 2017-04-14 MED ORDER — AMLODIPINE BESYLATE 5 MG PO TABS: 5.0000 mg | ORAL_TABLET | Freq: Every day | ORAL | 2 refills | Status: DC

## 2017-04-16 ENCOUNTER — Other Ambulatory Visit: Payer: Self-pay | Admitting: Family Medicine

## 2017-04-16 ENCOUNTER — Encounter: Payer: Self-pay | Admitting: Internal Medicine

## 2017-04-19 ENCOUNTER — Telehealth: Payer: Self-pay | Admitting: Family Medicine

## 2017-04-19 NOTE — Telephone Encounter (Signed)
Called pt. And LVM to return call and schedule a nurse visit to check her BP. PT. Needs the appt. In 2 weeks. When pt. Calls back schedule appt.

## 2017-04-26 ENCOUNTER — Ambulatory Visit: Payer: Self-pay | Attending: Family Medicine | Admitting: Family Medicine

## 2017-04-26 ENCOUNTER — Ambulatory Visit (INDEPENDENT_AMBULATORY_CARE_PROVIDER_SITE_OTHER): Payer: No Typology Code available for payment source | Admitting: Orthopaedic Surgery

## 2017-04-26 ENCOUNTER — Encounter: Payer: Self-pay | Admitting: Family Medicine

## 2017-04-26 VITALS — BP 164/79 | HR 66 | Temp 97.8°F | Ht 60.0 in | Wt 164.0 lb

## 2017-04-26 DIAGNOSIS — Z9841 Cataract extraction status, right eye: Secondary | ICD-10-CM | POA: Insufficient documentation

## 2017-04-26 DIAGNOSIS — R6889 Other general symptoms and signs: Secondary | ICD-10-CM

## 2017-04-26 DIAGNOSIS — H5789 Other specified disorders of eye and adnexa: Secondary | ICD-10-CM | POA: Insufficient documentation

## 2017-04-26 DIAGNOSIS — Z79899 Other long term (current) drug therapy: Secondary | ICD-10-CM | POA: Insufficient documentation

## 2017-04-26 DIAGNOSIS — Z8669 Personal history of other diseases of the nervous system and sense organs: Secondary | ICD-10-CM

## 2017-04-26 DIAGNOSIS — H538 Other visual disturbances: Secondary | ICD-10-CM | POA: Insufficient documentation

## 2017-04-26 DIAGNOSIS — I10 Essential (primary) hypertension: Secondary | ICD-10-CM | POA: Insufficient documentation

## 2017-04-26 MED ORDER — KETOTIFEN FUMARATE 0.025 % OP SOLN: 1.0000 [drp] | Freq: Two times a day (BID) | OPHTHALMIC | 0 refills | Status: DC

## 2017-04-26 NOTE — Progress Notes (Signed)
   Subjective:  Patient ID: Alexandra Brewer, female    DOB: 1955-07-26  Age: 62 y.o. MRN: [id number]  CC: Eye Pain   HPI Harvest Reyes-Garcia presents for c/o eye irritation. She reports itchiness and blurred vision. Associated symptoms watery eye drainage. She denies any right eye symptoms. She reports similar symptoms when she had cataract in right eye. She reports history of cataracts removal of right eye 4 years ago. History of hypertension. She has not taken BP medication prior to office visit.   Outpatient Medications Prior to Visit  Medication Sig Dispense Refill  . amLODipine (NORVASC) 5 MG tablet Take 1 tablet (5 mg total) by mouth daily. 30 tablet 2  . cetirizine (ZYRTEC) 10 MG tablet Take 10 mg by mouth daily.    Marland Kitchen levothyroxine (SYNTHROID, LEVOTHROID) 112 MCG tablet Take 1 tablet (112 mcg total) by mouth daily. 30 tablet 2  . traMADol (ULTRAM) 50 MG tablet Take 1 tablet (50 mg total) by mouth every 8 (eight) hours as needed for severe pain. 40 tablet 0  . acetaminophen (TYLENOL) 500 MG tablet Take 2 tablets (1,000 mg total) by mouth every 8 (eight) hours as needed for mild pain or moderate pain. 30 tablet 1  . atorvastatin (LIPITOR) 20 MG tablet Take 1 tablet (20 mg total) by mouth daily. (Patient not taking: Reported on 04/26/2017) 30 tablet 2  . Elastic Bandages & Supports (MEDICAL COMPRESSION STOCKINGS) MISC 25-30 mm Hg (Patient not taking: Reported on 04/26/2017) 2 each 0  . omeprazole (PRILOSEC) 20 MG capsule Take 1 capsule (20 mg total) by mouth daily. (Patient not taking: Reported on 04/26/2017) 90 capsule 3  . Wheat Dextrin (BENEFIBER) POWD Take by mouth 2 (two) times daily. Takes 2 tbsp twice daily     No facility-administered medications prior to visit.     ROS Review of Systems  Constitutional: Negative.   Eyes: Positive for itching and visual disturbance.  Respiratory: Negative.   Cardiovascular: Negative.    Objective:  BP (!) 164/79   Pulse 66   Temp 97.8 F  (36.6 C) (Oral)   Ht 5' (1.524 m)   Wt 164 lb (74.4 kg)   SpO2 100%   BMI 32.03 kg/m   BP/Weight 04/26/2017 04/09/2017 88/41/6606  Systolic BP 301 601 093  Diastolic BP 79 80 80  Wt. (Lbs) 164 161 159.4  BMI 32.03 30.79 31.13    Physical Exam  Constitutional: She appears well-developed and well-nourished.  Eyes: Conjunctivae are normal. Pupils are equal, round, and reactive to light. Right eye exhibits no discharge. Left eye exhibits no discharge.  Cardiovascular: Normal rate, regular rhythm, normal heart sounds and intact distal pulses.  Pulmonary/Chest: Effort normal and breath sounds normal.  Nursing note and vitals reviewed.   Assessment & Plan:   1. History of cataract surgery, right  - Ambulatory referral to Ophthalmology  2. Blurred vision, left eye -Visual acuity screen. - Ambulatory referral to Ophthalmology  3. Itchy eyes  - ketotifen (ALAWAY) 0.025 % ophthalmic solution; Place 1 drop into the left eye 2 (two) times daily.  Dispense: 5 mL; Refill: 0  4. History of cataract  - Ambulatory referral to Ophthalmology  5. Hypertension, unspecified type -BP control discussed. -Recommend checking BP daily at home, if no BP cuff available check  2 to 3 times per week at local pharmacy and keep log.       Follow-up: Return As needed.   Alfonse Spruce FNP

## 2017-04-26 NOTE — Patient Instructions (Signed)
Visin borrosa (Blurred Vision) Cuando una persona tiene visin borrosa, no puede ver con nitidez. La visin puede parecer difusa o fuera de foco. La visin borrosa es un sntoma muy frecuente de un problema ocular o de la visin. La visin suele ponerse borrosa gradualmente y presentarse en uno o ambos ojos. [place] causas de la visin borrosa, entre ellas, cataratas, degeneracin macular y retinopata diabtica. Esta afeccin se puede diagnosticar en funcin de los sntomas y de un examen fsico. Informe al mdico sobre cualquier otro problema mdico que tenga, cualquier lesin ocular reciente y ciruga previa. Tal vez deba consultar a un mdico especialista en problemas oculares (oftalmlogo). El tratamiento depende de la causa de la visin borrosa.  INSTRUCCIONES PARA EL CUIDADO EN EL HOGAR  Informe al mdico si hay cambios en la visin borrosa.  No conduzca ni opere maquinaria pesada si su visin es borrosa.  Concurra a todas las visitas de control como se lo haya indicado el mdico. Esto es importante. SOLICITE ATENCIN MDICA SI:  Los sntomas empeoran.  Aparecen nuevos sntomas.  Tiene dificultad para ver de noche.  Tiene dificultad para ver de cerca o de lejos.  Tiene dificultad para diferenciar los colores. SOLICITE ATENCIN MDICA DE INMEDIATO SI:  Siente dolor intenso en el ojo.  Sufre un dolor intenso de [place].  Le aparecen luces intermitentes en el campo visual.  Tiene cambios repentinos en la visin.  Pierde la visin repentinamente.  Tiene cambios en la visin despus de una lesin.  Tiene secrecin que [employer] ojos.  Le aparece una erupcin cutnea alrededor de los ojos. Esta informacin no tiene Marine scientist el consejo del mdico. Asegrese de hacerle al mdico cualquier pregunta que tenga. Document Released: 06/15/2008 Document Revised: 08/03/2014 Elsevier Interactive Patient Education  2017 [employer].

## 2017-04-29 ENCOUNTER — Encounter (INDEPENDENT_AMBULATORY_CARE_PROVIDER_SITE_OTHER): Payer: Self-pay | Admitting: Orthopaedic Surgery

## 2017-04-29 ENCOUNTER — Ambulatory Visit: Payer: Self-pay | Admitting: Nurse Practitioner

## 2017-04-29 ENCOUNTER — Ambulatory Visit (INDEPENDENT_AMBULATORY_CARE_PROVIDER_SITE_OTHER): Payer: No Typology Code available for payment source | Admitting: Orthopaedic Surgery

## 2017-04-29 DIAGNOSIS — M25511 Pain in right shoulder: Secondary | ICD-10-CM

## 2017-04-29 DIAGNOSIS — G8929 Other chronic pain: Secondary | ICD-10-CM

## 2017-04-29 MED ORDER — METHYLPREDNISOLONE 4 MG PO TBPK: ORAL_TABLET | ORAL | 0 refills | Status: DC

## 2017-04-29 MED FILL — ?METHYLPREDNISOLONE 4 MG TA: 4 | 21 days supply | Qty: 21 | Fill #0

## 2017-04-29 NOTE — Telephone Encounter (Signed)
-----   Message from Alfonse Spruce, [place] sent at 04/10/2017  4:09 PM EST ----- Thyroid levels normal on current dosage of levothyroxine. Recommend follow up in 3 months.

## 2017-04-29 NOTE — Telephone Encounter (Signed)
Called pt. And LVM to return call and schedule a nurse visit to check her BP. PT. Needs the appt. In 2 weeks. When pt. Calls back schedule appt.

## 2017-04-29 NOTE — Progress Notes (Signed)
Office Visit Note   Patient: Alexandra Brewer           Date of Birth: 1955-07-08           MRN: [id number] Visit Date: 04/29/2017              Requested by: Alfonse Spruce, [address] [place], [place] [address] PCP: Alfonse Spruce, FNP   Assessment & Plan: Visit Diagnoses:  1. Chronic right shoulder pain     Plan: Impression is 62 year old female with right shoulder pain possible early frozen shoulder.  We discussed cortisone injection versus oral corticosteroid.  We will try Medrol Dosepak to see if this will help relieve her pain.  If not better we may need to consider an intra-articular steroid injection.  Follow-up as needed.  Follow-Up Instructions: Return if symptoms worsen or fail to improve.   Orders:  No orders of the defined types were placed in this encounter.  Meds ordered this encounter  Medications  . methylPREDNISolone (MEDROL DOSEPAK) 4 MG TBPK tablet    Sig: Use as directed    Dispense:  21 tablet    Refill:  0      Procedures: No procedures performed   Clinical Data: No additional findings.   Subjective: Chief Complaint  Patient presents with  . Right Shoulder - Pain    Patient is a 62 year old Hispanic female comes in with right shoulder pain that is worse over the last year.  Recent injuries.  She may have had a fall 3 years ago.  She is right-hand dominant.  She says that she has some decreased range of motion of her right shoulder.    Review of Systems  Constitutional: Negative.   HENT: Negative.   Eyes: Negative.   Respiratory: Negative.   Cardiovascular: Negative.   Endocrine: Negative.   Musculoskeletal: Negative.   Neurological: Negative.   Hematological: Negative.   Psychiatric/Behavioral: Negative.   All other systems reviewed and are negative.    Objective: Vital Signs: There were no vitals taken for this visit.  Physical Exam  Constitutional: She is oriented to person, place, and time. She  appears well-developed and well-nourished.  HENT:  Head: Normocephalic and atraumatic.  Eyes: EOM are normal.  Neck: Neck supple.  Pulmonary/Chest: Effort normal.  Abdominal: Soft.  Neurological: She is alert and oriented to person, place, and time.  Skin: Skin is warm. Capillary refill takes less than 2 seconds.  Psychiatric: She has a normal mood and affect. Her behavior is normal. Judgment and thought content normal.  Nursing note and vitals reviewed.   Ortho Exam Right shoulder exam shows overall normal range of motion.  He does not have any real impingement signs.  Rotator cuff is grossly intact and pretty strong to testing. Specialty Comments:  No specialty comments available.  Imaging: No results found.   PMFS History: Patient Active Problem List   Diagnosis Date Noted  . RUQ pain 01/23/2017  . GERD (gastroesophageal reflux disease) 11/26/2016  . Right Achilles tendinitis 08/15/2016  . Hypertension 07/05/2016  . Pain, dental 07/05/2016  . Arthritis of right shoulder region 02/21/2016  . Heel pain, bilateral 01/10/2016  . 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 History of colonic polyps   . Diverticulosis of colon without hemorrhage   . Hypothyroidism 08/10/2015  . Chronic constipation 08/05/2015  . Hyperlipidemia 08/05/2015   Past Medical History:  Diagnosis Date  . Asthma   . Constipation   .  Hyperlipemia   . Thyroid disease     Family History  Problem Relation Age of Onset  . Diabetes Father   . Hypertension Father   . Stroke Maternal Grandmother   . Stroke Maternal Grandfather   . Colon cancer Neg Hx     Past Surgical History:  Procedure Laterality Date  . CATARACT EXTRACTION    . COLONOSCOPY N/A 10/10/2015   Procedure: COLONOSCOPY;  Surgeon: Daneil Dolin, MD;  Location: [facility];  Service: Endoscopy;  Laterality: N/A;  245 - Interpreter scheduled, do NOT move   Social History   Occupational History  . Not on file    Tobacco Use  . Smoking status: Never Smoker  . Smokeless tobacco: Never Used  Substance and Sexual Activity  . Alcohol use: No    Alcohol/week: 0.0 oz  . Drug use: No  . Sexual activity: Not Currently

## 2017-04-29 NOTE — Telephone Encounter (Signed)
Medical Assistant used [employer] to contact patient.  Interpreter Name:Edgar Interpreter #: [phone] Clarence states to give a call back to [place] with [facility] at [phone]. !!!Please inform patient of thyroid levels being normal and to continue current dosage, follow up in 3 months!!!

## 2017-05-08 ENCOUNTER — Telehealth: Payer: Self-pay | Admitting: Family Medicine

## 2017-05-08 NOTE — Telephone Encounter (Signed)
Pt called back to try to speak with the PCP since she call but no instruction was on the call records, please follow up

## 2017-05-08 NOTE — Telephone Encounter (Signed)
Provider wants pt to schedule a BP check in two weeks.LVM to call back. If pt calls back schedule BP check with Chuck Hint

## 2017-05-09 NOTE — Telephone Encounter (Signed)
Please schedule BP check with Marzetta Board or Carilyn Goodpasture tomorrow or next week if available.

## 2017-05-21 ENCOUNTER — Ambulatory Visit: Payer: Self-pay | Attending: Family Medicine | Admitting: Pharmacist

## 2017-05-21 ENCOUNTER — Encounter: Payer: Self-pay | Admitting: Pharmacist

## 2017-05-21 ENCOUNTER — Telehealth: Payer: Self-pay | Admitting: Family Medicine

## 2017-05-21 VITALS — BP 125/77 | HR 73

## 2017-05-21 DIAGNOSIS — Z79899 Other long term (current) drug therapy: Secondary | ICD-10-CM | POA: Insufficient documentation

## 2017-05-21 DIAGNOSIS — I10 Essential (primary) hypertension: Secondary | ICD-10-CM | POA: Insufficient documentation

## 2017-05-21 MED FILL — AMLODIPINE BESYLATE 2.5 MG: 2.5 | 30 days supply | Qty: 30 | Fill #1

## 2017-05-21 MED FILL — MELOXICAM 15 MG TABLET: 15 | 30 days supply | Qty: 30 | Fill #2

## 2017-05-21 MED FILL — ?LEVOTHYROXINE 112 MCG TAB: 112 | 30 days supply | Qty: 30 | Fill #1

## 2017-05-21 NOTE — Telephone Encounter (Signed)
Pt came in to state she is still feeling pain in all her right side of her body.from her shoulder to her feet Please advise patient via Phone

## 2017-05-21 NOTE — Progress Notes (Signed)
   S:    Patient arrives in good spirits.    Presents to the clinic for hypertension evaluation. [employer] Kalman Shan [phone] was used for the entirety of the visit.   Patient reports adherence with medications.  Current BP Medications include:  Amlodipine 5 mg daily    O:   Last 3 Office BP readings: BP Readings from Last 3 Encounters:  05/21/17 125/77  04/26/17 (!) 164/79  04/09/17 (!) 147/80    BMET    Component Value Date/Time   NA 140 12/12/2016 1427   K 4.5 12/12/2016 1427   CL 99 12/12/2016 1427   CO2 26 12/12/2016 1427   GLUCOSE 83 12/12/2016 1427   GLUCOSE 85 07/14/2015 0908   BUN 10 12/12/2016 1427   CREATININE 0.77 12/12/2016 1427   CREATININE 0.72 07/14/2015 0908   CALCIUM 9.9 12/12/2016 1427   GFRNONAA 85 12/12/2016 1427   GFRAA 98 12/12/2016 1427    A/P: Hypertension longstanding currently controlled on current medications.  Continued current medications.   Results reviewed and written information provided.   Total time in face-to-face counseling 5 minutes.   F/U Clinic Visit with PCP as directed.

## 2017-05-21 NOTE — Patient Instructions (Signed)
Thanks for coming to see me  Blood pressure is good  Follow up with PCP as directed

## 2017-05-23 ENCOUNTER — Other Ambulatory Visit: Payer: Self-pay | Admitting: Family Medicine

## 2017-05-23 DIAGNOSIS — M1990 Unspecified osteoarthritis, unspecified site: Secondary | ICD-10-CM

## 2017-05-23 MED ORDER — MELOXICAM 7.5 MG PO TABS
7.5000 mg | ORAL_TABLET | Freq: Every day | ORAL | 1 refills | Status: DC
Start: 2017-05-23 — End: 2017-06-11

## 2017-05-23 NOTE — Telephone Encounter (Signed)
I can prescribe her meloxicam for pain. She has already been referred and evaluated by orthopedics. I would recommend she follow up with her orthopedic specialist.

## 2017-05-27 NOTE — Telephone Encounter (Signed)
Spoke to patient. Patient will pick up Meloxicam.  Rolm Gala assist with the call.

## 2017-06-11 ENCOUNTER — Ambulatory Visit: Payer: Self-pay | Attending: Internal Medicine | Admitting: Internal Medicine

## 2017-06-11 ENCOUNTER — Encounter: Payer: Self-pay | Admitting: Internal Medicine

## 2017-06-11 VITALS — BP 143/73 | HR 79 | Temp 97.9°F | Resp 16 | Ht 60.0 in | Wt 161.4 lb

## 2017-06-11 DIAGNOSIS — E782 Mixed hyperlipidemia: Secondary | ICD-10-CM | POA: Insufficient documentation

## 2017-06-11 DIAGNOSIS — Z79890 Hormone replacement therapy (postmenopausal): Secondary | ICD-10-CM | POA: Insufficient documentation

## 2017-06-11 DIAGNOSIS — M19011 Primary osteoarthritis, right shoulder: Secondary | ICD-10-CM | POA: Insufficient documentation

## 2017-06-11 DIAGNOSIS — E785 Hyperlipidemia, unspecified: Secondary | ICD-10-CM

## 2017-06-11 DIAGNOSIS — K5909 Other constipation: Secondary | ICD-10-CM | POA: Insufficient documentation

## 2017-06-11 DIAGNOSIS — Z880 Allergy status to penicillin: Secondary | ICD-10-CM | POA: Insufficient documentation

## 2017-06-11 DIAGNOSIS — I10 Essential (primary) hypertension: Secondary | ICD-10-CM

## 2017-06-11 DIAGNOSIS — M7501 Adhesive capsulitis of right shoulder: Secondary | ICD-10-CM

## 2017-06-11 DIAGNOSIS — Z760 Encounter for issue of repeat prescription: Secondary | ICD-10-CM | POA: Insufficient documentation

## 2017-06-11 DIAGNOSIS — M1990 Unspecified osteoarthritis, unspecified site: Secondary | ICD-10-CM

## 2017-06-11 DIAGNOSIS — Z91013 Allergy to seafood: Secondary | ICD-10-CM | POA: Insufficient documentation

## 2017-06-11 DIAGNOSIS — M5431 Sciatica, right side: Secondary | ICD-10-CM

## 2017-06-11 DIAGNOSIS — K219 Gastro-esophageal reflux disease without esophagitis: Secondary | ICD-10-CM | POA: Insufficient documentation

## 2017-06-11 DIAGNOSIS — Z79899 Other long term (current) drug therapy: Secondary | ICD-10-CM | POA: Insufficient documentation

## 2017-06-11 DIAGNOSIS — E039 Hypothyroidism, unspecified: Secondary | ICD-10-CM | POA: Insufficient documentation

## 2017-06-11 MED ORDER — MELOXICAM 15 MG PO TABS: 15.0000 mg | ORAL_TABLET | Freq: Every day | ORAL | 6 refills | Status: DC

## 2017-06-11 MED ORDER — AMLODIPINE BESYLATE 5 MG PO TABS: 5.0000 mg | ORAL_TABLET | Freq: Every day | ORAL | 6 refills | Status: DC

## 2017-06-11 MED ORDER — ATORVASTATIN CALCIUM 20 MG PO TABS: 20.0000 mg | ORAL_TABLET | Freq: Every day | ORAL | 6 refills | Status: DC

## 2017-06-11 MED ORDER — GABAPENTIN 100 MG PO CAPS: 200.0000 mg | ORAL_CAPSULE | Freq: Every day | ORAL | 3 refills | Status: DC

## 2017-06-11 NOTE — Progress Notes (Signed)
Patient is here to establish care for hypothyroidism.   Patient stated her right leg and right shoulder are in pain everyday.  Pt's would like to know if she need to have another orthopedic treatment. Alexandra Brewer

## 2017-06-11 NOTE — Patient Instructions (Signed)
Citica  (Sciatica)  La citica es el dolor, la debilidad, el entumecimiento o el hormigueo a lo largo del nervio citico. El nervio citico comienza en la parte inferior de la espalda y desciende por la parte posterior de cada pierna. La citica se produce cuando este nervio se comprime o se ejerce presin sobre l. Suele desaparecer por s sola o con tratamiento. A veces, la citica puede volver a aparecer.  CUIDADOS EN EL HOGAR  Medicamentos   Tome los medicamentos de venta libre y los recetados solamente como se lo haya indicado el mdico.   No conduzca ni use maquinaria pesada mientras toma analgsicos recetados.  Control del dolor   Si se lo indican, aplique hielo sobre la zona afectada.  ? Ponga el hielo en una bolsa plstica.  ? Coloque una toalla entre la piel y la bolsa de hielo.  ? Coloque el hielo durante 20minutos, 2 a 3veces por da.   Despus del hielo, aplique calor sobre la zona afectada antes de hacer ejercicio o con la frecuencia que le haya indicado el mdico. Use la fuente de calor que el mdico le indique, por ejemplo, una compresa de calor hmedo o una almohadilla trmica.  ? Coloque una toalla entre la piel y la fuente de calor.  ? Aplique el calor durante 20 a 30minutos.  ? Retire la fuente de calor si la piel se le pone de color rojo brillante. Esto es muy importante si no puede sentir el dolor, el calor o el fro. Puede correr un riesgo mayor de sufrir quemaduras.  Actividad   Retome sus actividades habituales como se lo haya indicado el mdico. Pregntele al mdico qu actividades son seguras para usted.  ? Evite las actividades que empeoran la citica.   Descanse por breves perodos durante el da. Hgalo recostado o de pie. Esto suele ser mejor que descansar sentado.  ? Cuando descanse durante perodos ms largos, haga alguna actividad fsica o un estiramiento entre los perodos de descanso.  ? Evite estar sentado durante largos perodos sin moverse. Levntese y muvase al  menos una vez cada hora.   Haga ejercicios y estrese con regularidad, como se lo indic el mdico.   No levante nada que pese ms de 10libras (4,5kg) mientras tenga sntomas de citica.  ? Aunque no tenga sntomas, evite levantar objetos pesados.  ? Evite levantar objetos pesados de forma repetida.   Al levantar objetos, hgalos siempre de una forma que sea segura para su cuerpo. Para esto, debe hacer lo siguiente:  ? Flexione las rodillas.  ? Mantenga el objeto cerca del cuerpo.  ? No se tuerza.  Instrucciones generales   Mantenga una buena postura.  ? Evite reclinarse hacia adelante cuando est sentado.  ? Evite encorvar la espalda mientras est de pie.   Mantenga un peso saludable.   Use calzado cmodo, que le d soporte al pie. Evite usar tacones.   Evite dormir sobre un colchn que sea demasiado blando o demasiado duro. Es posible que sienta menos dolor si duerme en un colchn con apoyo suficientemente firme para la espalda.   Concurra a todas las visitas de control como se lo haya indicado el mdico. Esto es importante.  SOLICITE AYUDA SI:   Tiene un dolor con estas caractersticas:  ? Lo despierta cuando est dormido.  ? Empeora al estar recostado.  ? Es peor que el dolor que tena en el pasado.  ? Dura ms de 4semanas.   Pierde peso sin proponrselo.    SOLICITE AYUDA DE INMEDIATO SI:   No puede controlar la orina (miccin) ni la evacuacin de la materia fecal (defecacin).   Tiene debilidad en alguna de estas zonas, y la debilidad empeora.  ? La parte inferior de la espalda.  ? La parte inferior del abdomen (pelvis).  ? Los glteos.  ? Las piernas.   Siente irritacin o inflamacin en la espalda.   Tiene sensacin de ardor al orinar.  Esta informacin no tiene como fin reemplazar el consejo del mdico. Asegrese de hacerle al mdico cualquier pregunta que tenga.  Document Released: 04/21/2010 Document Revised: 07/11/2015 Document Reviewed: 11/26/2014  Elsevier Interactive Patient Education   2018 Elsevier Inc[employer]

## 2017-06-11 NOTE — Progress Notes (Signed)
Patient ID: Alexandra Brewer, female    DOB: 26-Jul-1955  MRN: [id number]  CC: Establish Care and Medication Refill   Subjective: Alexandra Brewer is a 62 y.o. female who presents to become established with me as PCP.  Previous provider was NP [place]. Her concerns today include:  HTN, thyroid ds, HL, GERD, Frozen shoulder RT  1.  C/o burning pain posterior RT leg all the way down to the toes x 3 wks.  Starts in the buttock and radiates down.  Constant and worse in the afternoon.  -no initiating factor.  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  -some numbness at times. "there is no strength on it." -no loss of bowel or bladder -On meloxicam for arthritis and shoulder pain.  She finds this helpful for this new pain in the right leg  2.  RT shoulder pain: saw Dr. Erlinda Hong 04/2017 and dx with possible frozen shoulder.  Given Medrol pack which she felt helped only a little.  She would like to return to see him for possible joint injection  3.  HTN: taking Norvasc QOD.  No CP/SOB/LE edema.  4.  Thyroid:  Takes Levothyroxine daily.  No unexplained weight loss or gain.  5.  HL:  Out of Lipitor x 2 mths.  She was tolerating the medication okay  Patient Active Problem List   Diagnosis Date Noted  . RUQ pain 01/23/2017  . GERD (gastroesophageal reflux disease) 11/26/2016  . Right Achilles tendinitis 08/15/2016  . Hypertension 07/05/2016  . Pain, dental 07/05/2016  . Arthritis of right shoulder region 02/21/2016  . Heel pain, bilateral 01/10/2016  . 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 History of colonic polyps   . Diverticulosis of colon without hemorrhage   . Hypothyroidism 08/10/2015  . Chronic constipation 08/05/2015  . Hyperlipidemia 08/05/2015     Current Outpatient Medications on File Prior to Visit  Medication Sig Dispense Refill  . levothyroxine (SYNTHROID, LEVOTHROID) 112 MCG tablet Take 1 tablet (112 mcg total) by mouth  daily. 30 tablet 2  . traMADol (ULTRAM) 50 MG tablet Take 1 tablet (50 mg total) by mouth every 8 (eight) hours as needed for severe pain. 40 tablet 0  . acetaminophen (TYLENOL) 500 MG tablet Take 2 tablets (1,000 mg total) by mouth every 8 (eight) hours as needed for mild pain or moderate pain. (Patient not taking: Reported on 04/29/2017) 30 tablet 1  . cetirizine (ZYRTEC) 10 MG tablet Take 10 mg by mouth daily.    . Elastic Bandages & Supports (MEDICAL COMPRESSION STOCKINGS) MISC 25-30 mm Hg (Patient not taking: Reported on 04/26/2017) 2 each 0  . ketotifen (ALAWAY) 0.025 % ophthalmic solution Place 1 drop into the left eye 2 (two) times daily. (Patient not taking: Reported on 04/29/2017) 5 mL 0  . omeprazole (PRILOSEC) 20 MG capsule Take 1 capsule (20 mg total) by mouth daily. (Patient not taking: Reported on 04/26/2017) 90 capsule 3  . Wheat Dextrin (BENEFIBER) POWD Take by mouth 2 (two) times daily. Takes 2 tbsp twice daily     No current facility-administered medications on file prior to visit.     Allergies  Allergen Reactions  . Fish Allergy     Throat swell up  . Penicillins     Has patient had a PCN reaction causing immediate rash, facial/tongue/throat swelling, SOB or lightheadedness with hypotension: Yes/No:[id number] unknown Has patient had a PCN reaction causing severe rash involving mucus membranes  or skin necrosis: [id number] unknown Has patient had a PCN reaction that required hospitalization Yes/No:[id number] unknown Has patient had a PCN reaction occurring within the last 10 years: Yes/No:[id number] If all of the above answers are "NO", then may proceed with C  . Shellfish Allergy     Throat swell up    Social History   Socioeconomic History  . Marital status: Single    Spouse name: Not on file  . Number of children: Not on file  . Years of education: Not on file  . Highest education level: Not on file  Social Needs  . Financial resource strain: Not on file  . Food  insecurity - worry: Not on file  . Food insecurity - inability: Not on file  . Transportation needs - medical: Not on file  . Transportation needs - non-medical: Not on file  Occupational History  . Not on file  Tobacco Use  . Smoking status: Never Smoker  . Smokeless tobacco: Never Used  Substance and Sexual Activity  . Alcohol use: No    Alcohol/week: 0.0 oz  . Drug use: No  . Sexual activity: Not Currently  Other Topics Concern  . Not on file  Social History Narrative  . Not on file    Family History  Problem Relation Age of Onset  . Diabetes Father   . Hypertension Father   . Stroke Maternal Grandmother   . Stroke Maternal Grandfather   . Colon cancer Neg Hx     Past Surgical History:  Procedure Laterality Date  . CATARACT EXTRACTION    . COLONOSCOPY N/A 10/10/2015   Procedure: COLONOSCOPY;  Surgeon: Daneil Dolin, MD;  Location: [facility];  Service: Endoscopy;  Laterality: N/A;  245 - Interpreter scheduled, do NOT move    ROS: Review of Systems Negative except as stated above PHYSICAL EXAM: BP (!) 143/73 (BP Location: Left Arm, Patient Position: Sitting, Cuff Size: Normal)   Pulse 79   Temp 97.9 F (36.6 C) (Oral)   Resp 16   Ht 5' (1.524 m)   Wt 161 lb 6.4 oz (73.2 kg)   SpO2 97%   BMI 31.52 kg/m   Physical Exam  General appearance - alert, well appearing, and in no distress Mental status - alert, oriented to person, place, and time, normal mood, behavior, speech, dress, motor activity, and thought processes Neck - supple, no significant adenopathy Chest - clear to auscultation, no wheezes, rales or rhonchi, symmetric air entry Heart - normal rate, regular rhythm, normal S1, S2, no murmurs, rubs, clicks or gallops Neurological - motor and sensory grossly normal bilaterally in lower extremities.  Gait is normal. Musculoskeletal -no tenderness on palpation of the LS spine.   Right shoulder: No point tenderness.  Mild discomfort with passive range of  motion in all directions. Extremities -lower extremity edema   ASSESSMENT AND PLAN: 1. Essential hypertension Not at goal. Advised patient to take the Norvasc daily as prescribed. - amLODipine (NORVASC) 5 MG tablet; Take 1 tablet (5 mg total) by mouth daily.  Dispense: 30 tablet; Refill: 6  2. Mixed hyperlipidemia - atorvastatin (LIPITOR) 20 MG tablet; Take 1 tablet (20 mg total) by mouth daily.  Dispense: 30 tablet; Refill: 6  3. Chronic arthritis - meloxicam (MOBIC) 15 MG tablet; Take 1 tablet (15 mg total) by mouth daily.  Dispense: 30 tablet; Refill: 6  4. Adhesive capsulitis of right shoulder - Ambulatory referral to Orthopedic Surgery  5. Sciatica of right side Increase meloxicam to 15  mg daily.  Will add low dose of gabapentin to take at bedtime. - gabapentin (NEURONTIN) 100 MG capsule; Take 2 capsules (200 mg total) by mouth at bedtime.  Dispense: 60 capsule; Refill: 3   Patient was given the opportunity to ask questions.  Patient verbalized understanding of the plan and was able to repeat key elements of the plan.   Orders Placed This Encounter  Procedures  . Ambulatory referral to Orthopedic Surgery     Requested Prescriptions   Signed Prescriptions Disp Refills  . amLODipine (NORVASC) 5 MG tablet 30 tablet 6    Sig: Take 1 tablet (5 mg total) by mouth daily.  Marland Kitchen atorvastatin (LIPITOR) 20 MG tablet 30 tablet 6    Sig: Take 1 tablet (20 mg total) by mouth daily.  . meloxicam (MOBIC) 15 MG tablet 30 tablet 6    Sig: Take 1 tablet (15 mg total) by mouth daily.  Marland Kitchen gabapentin (NEURONTIN) 100 MG capsule 60 capsule 3    Sig: Take 2 capsules (200 mg total) by mouth at bedtime.    Return in about 3 months (around 09/11/2017).  Karle Plumber, MD, FACP

## 2017-06-12 MED FILL — ?ATORVASTATIN 20 MG TABLET: 20 | 30 days supply | Qty: 30 | Fill #0

## 2017-06-12 MED FILL — GABAPENTIN 100 MG CAPSULE: 100 | 30 days supply | Qty: 60 | Fill #0

## 2017-06-17 ENCOUNTER — Ambulatory Visit: Payer: Self-pay | Attending: Internal Medicine

## 2017-07-01 NOTE — Progress Notes (Deleted)
Referring Provider: Orlando Penner* Primary Care Physician:  Ladell Pier, MD Primary GI:  Dr. Gala Romney  No chief complaint on file.   HPI:   Alexandra Brewer is a 62 y.o. female who presents for follow-up on abdominal pain.  The patient was last seen in our office 01/23/2017 for GERD, chronic constipation, right upper quadrant pain.  Her visit was assisted by an Chief Executive Officer.  At her last visit she noted she was doing much better, stopped Linzess after 2 weeks due to diarrhea and now having a bowel movement once a day which is regular incomplete emptying.  Denies abdominal pain unless she eats spicy food or seafood.  No GERD symptoms unless she eats spicy foods.  Some right-sided abdominal discomfort which is intermittent and described as "inflamed."  Worse if she presses on it.  States it is "more annoying and nagging but not really pain."  No other GI symptoms.  Recommended abdominal ultrasound, continue medications, follow-up in 3 months.  Abdominal ultrasound of the right upper quadrant found normal exam.  Today she states   Past Medical History:  Diagnosis Date  . Asthma   . Constipation   . Hyperlipemia   . Thyroid disease     Past Surgical History:  Procedure Laterality Date  . CATARACT EXTRACTION    . COLONOSCOPY N/A 10/10/2015   Procedure: COLONOSCOPY;  Surgeon: Daneil Dolin, MD;  Location: [facility];  Service: Endoscopy;  Laterality: N/A;  245 - Interpreter scheduled, do NOT move    Current Outpatient Medications  Medication Sig Dispense Refill  . acetaminophen (TYLENOL) 500 MG tablet Take 2 tablets (1,000 mg total) by mouth every 8 (eight) hours as needed for mild pain or moderate pain. (Patient not taking: Reported on 04/29/2017) 30 tablet 1  . amLODipine (NORVASC) 5 MG tablet Take 1 tablet (5 mg total) by mouth daily. 30 tablet 6  . atorvastatin (LIPITOR) 20 MG tablet Take 1 tablet (20 mg total) by mouth daily. 30 tablet 6  .  cetirizine (ZYRTEC) 10 MG tablet Take 10 mg by mouth daily.    . Elastic Bandages & Supports (MEDICAL COMPRESSION STOCKINGS) MISC 25-30 mm Hg (Patient not taking: Reported on 04/26/2017) 2 each 0  . gabapentin (NEURONTIN) 100 MG capsule Take 2 capsules (200 mg total) by mouth at bedtime. 60 capsule 3  . ketotifen (ALAWAY) 0.025 % ophthalmic solution Place 1 drop into the left eye 2 (two) times daily. (Patient not taking: Reported on 04/29/2017) 5 mL 0  . levothyroxine (SYNTHROID, LEVOTHROID) 112 MCG tablet Take 1 tablet (112 mcg total) by mouth daily. 30 tablet 2  . meloxicam (MOBIC) 15 MG tablet Take 1 tablet (15 mg total) by mouth daily. 30 tablet 6  . omeprazole (PRILOSEC) 20 MG capsule Take 1 capsule (20 mg total) by mouth daily. (Patient not taking: Reported on 04/26/2017) 90 capsule 3  . traMADol (ULTRAM) 50 MG tablet Take 1 tablet (50 mg total) by mouth every 8 (eight) hours as needed for severe pain. 40 tablet 0  . Wheat Dextrin (BENEFIBER) POWD Take by mouth 2 (two) times daily. Takes 2 tbsp twice daily     No current facility-administered medications for this visit.     Allergies as of 07/02/2017 - Review Complete 06/11/2017  Allergen Reaction Noted  . Fish allergy  06/11/2017  . Penicillins  07/20/2015  . Shellfish allergy  06/11/2017    Family History  Problem Relation Age of Onset  .  Diabetes Father   . Hypertension Father   . Stroke Maternal Grandmother   . Stroke Maternal Grandfather   . Colon cancer Neg Hx     Social History   Socioeconomic History  . Marital status: Single    Spouse name: Not on file  . Number of children: Not on file  . Years of education: Not on file  . Highest education level: Not on file  Occupational History  . Not on file  Social Needs  . Financial resource strain: Not on file  . Food insecurity:    Worry: Not on file    Inability: Not on file  . Transportation needs:    Medical: Not on file    Non-medical: Not on file  Tobacco Use    . Smoking status: Never Smoker  . Smokeless tobacco: Never Used  Substance and Sexual Activity  . Alcohol use: No    Alcohol/week: 0.0 oz  . Drug use: No  . Sexual activity: Not Currently  Lifestyle  . Physical activity:    Days per week: Not on file    Minutes per session: Not on file  . Stress: Not on file  Relationships  . Social connections:    Talks on phone: Not on file    Gets together: Not on file    Attends religious service: Not on file    Active member of club or organization: Not on file    Attends meetings of clubs or organizations: Not on file    Relationship status: Not on file  Other Topics Concern  . Not on file  Social History Narrative  . Not on file    Review of Systems: General: Negative for anorexia, weight loss, fever, chills, fatigue, weakness. Eyes: Negative for vision changes.  ENT: Negative for hoarseness, difficulty swallowing , nasal congestion. CV: Negative for chest pain, angina, palpitations, dyspnea on exertion, peripheral edema.  Respiratory: Negative for dyspnea at rest, dyspnea on exertion, cough, sputum, wheezing.  GI: See history of present illness. GU:  Negative for dysuria, hematuria, urinary incontinence, urinary frequency, nocturnal urination.  MS: Negative for joint pain, low back pain.  Derm: Negative for rash or itching.  Neuro: Negative for weakness, abnormal sensation, seizure, frequent headaches, memory loss, confusion.  Psych: Negative for anxiety, depression, suicidal ideation, hallucinations.  Endo: Negative for unusual weight change.  Heme: Negative for bruising or bleeding. Allergy: Negative for rash or hives.   Physical Exam: There were no vitals taken for this visit. General:   Alert and oriented. Pleasant and cooperative. Well-nourished and well-developed.  Head:  Normocephalic and atraumatic. Eyes:  Without icterus, sclera clear and conjunctiva pink.  Ears:  Normal auditory acuity. Mouth:  No deformity or  lesions, oral mucosa pink.  Throat/Neck:  Supple, without mass or thyromegaly. Cardiovascular:  S1, S2 present without murmurs appreciated. Normal pulses noted. Extremities without clubbing or edema. Respiratory:  Clear to auscultation bilaterally. No wheezes, rales, or rhonchi. No distress.  Gastrointestinal:  +BS, soft, non-tender and non-distended. No HSM noted. No guarding or rebound. No masses appreciated.  Rectal:  Deferred  Musculoskalatal:  Symmetrical without gross deformities. Normal posture. Skin:  Intact without significant lesions or rashes. Neurologic:  Alert and oriented x4;  grossly normal neurologically. Psych:  Alert and cooperative. Normal mood and affect. Heme/Lymph/Immune: No significant cervical adenopathy. No excessive bruising noted.    07/01/2017 10:34 AM   Disclaimer: This note was dictated with voice recognition software. Similar sounding words can inadvertently be transcribed  and may not be corrected upon review.

## 2017-07-02 ENCOUNTER — Ambulatory Visit: Payer: Self-pay | Admitting: Nurse Practitioner

## 2017-07-10 ENCOUNTER — Ambulatory Visit: Payer: Self-pay

## 2017-08-06 MED FILL — LEVOTHYROXINE 112 MCG TAB: 112 | 30 days supply | Qty: 30 | Fill #2

## 2017-08-06 MED FILL — AMLODIPINE 2.5 MG TABLET: 2.5 | 30 days supply | Qty: 30 | Fill #2

## 2017-08-21 ENCOUNTER — Ambulatory Visit: Payer: Self-pay | Attending: Internal Medicine | Admitting: Physician Assistant

## 2017-08-21 VITALS — BP 136/80 | HR 76 | Temp 98.5°F | Resp 16 | Wt 164.0 lb

## 2017-08-21 DIAGNOSIS — Z79890 Hormone replacement therapy (postmenopausal): Secondary | ICD-10-CM | POA: Insufficient documentation

## 2017-08-21 DIAGNOSIS — Z79899 Other long term (current) drug therapy: Secondary | ICD-10-CM | POA: Insufficient documentation

## 2017-08-21 DIAGNOSIS — J45909 Unspecified asthma, uncomplicated: Secondary | ICD-10-CM | POA: Insufficient documentation

## 2017-08-21 DIAGNOSIS — M1990 Unspecified osteoarthritis, unspecified site: Secondary | ICD-10-CM

## 2017-08-21 DIAGNOSIS — L309 Dermatitis, unspecified: Secondary | ICD-10-CM

## 2017-08-21 DIAGNOSIS — M5431 Sciatica, right side: Secondary | ICD-10-CM

## 2017-08-21 DIAGNOSIS — E785 Hyperlipidemia, unspecified: Secondary | ICD-10-CM | POA: Insufficient documentation

## 2017-08-21 DIAGNOSIS — E079 Disorder of thyroid, unspecified: Secondary | ICD-10-CM | POA: Insufficient documentation

## 2017-08-21 MED ORDER — METHOCARBAMOL 500 MG PO TABS: 500.0000 mg | ORAL_TABLET | Freq: Three times a day (TID) | ORAL | 0 refills | Status: DC | PRN

## 2017-08-21 MED ORDER — CETIRIZINE HCL 10 MG PO TABS: 10.0000 mg | ORAL_TABLET | Freq: Every day | ORAL | 2 refills | Status: DC

## 2017-08-21 MED ORDER — TRIAMCINOLONE ACETONIDE 0.1 % EX CREA: 1.0000 "application " | TOPICAL_CREAM | Freq: Two times a day (BID) | CUTANEOUS | 0 refills | Status: DC

## 2017-08-21 MED ORDER — MELOXICAM 15 MG PO TABS: 15.0000 mg | ORAL_TABLET | Freq: Every day | ORAL | 6 refills | Status: DC

## 2017-08-21 MED FILL — MELOXICAM 15 MG TABLET: 15 | 30 days supply | Qty: 30 | Fill #0

## 2017-08-21 MED FILL — TRIAMCINOLONE ACETONIDE 0.1: 0.1 | 30 days supply | Qty: 45 | Fill #0

## 2017-08-21 MED FILL — METHOCARBAMOL 500 MG TABS: 500 | 30 days supply | Qty: 90 | Fill #0

## 2017-08-21 NOTE — Progress Notes (Signed)
Patient ID: Alexandra Brewer, female   DOB: 12/13/1955, 62 y.o.   MRN: [id number]       Alexandra Brewer, is a 62 y.o. female  TZG:[id number]  PRF:[id number]  DOB - [birth year]  Subjective:  Chief Complaint and HPI: Alexandra Brewer is a 62 y.o. female here today "[place]" with [employer].   Rash on the back of the neck and bt her breasts. No new jewelry. No f/c.  No new soaps or detergents.   Also, gabapentin is causing her to feel dizzy so she is not taking it.  She was taking it and meloxicam for sciatica.  The meloxicam is helping.  ROS:   Constitutional:  No f/c, No night sweats, No unexplained weight loss. EENT:  No vision changes, No blurry vision, No hearing changes. No mouth, throat, or ear problems.  Respiratory: No cough, No SOB Cardiac: No CP, no palpitations GI:  No abd pain, No N/V/D. GU: No Urinary s/sx Musculoskeletal: back and sciatic [pain Neuro: No headache, no dizziness, no motor weakness.  Skin: + rash Endocrine:  No polydipsia. No polyuria.  Psych: Denies SI/HI  No problems updated.  ALLERGIES:  PAST MEDICAL HISTORY: Past Medical History:  Diagnosis Date  . Asthma   . Constipation   . Hyperlipemia   . Thyroid disease     MEDICATIONS AT HOME: Prior to Admission medications   Medication Sig Start Date End Date Taking? Authorizing Provider  acetaminophen (TYLENOL) 500 MG tablet Take 2 tablets (1,000 mg total) by mouth every 8 (eight) hours as needed for mild pain or moderate pain. Patient not taking: Reported on 04/29/2017 04/09/17   Alfonse Spruce, FNP  amLODipine (NORVASC) 5 MG tablet Take 1 tablet (5 mg total) by mouth daily. 06/11/17   Ladell Pier, MD  atorvastatin (LIPITOR) 20 MG tablet Take 1 tablet (20 mg total) by mouth daily. 06/11/17   Ladell Pier, MD  cetirizine (ZYRTEC) 10 MG tablet Take 1 tablet (10 mg total) by mouth daily. Prn itching 08/21/17   Argentina Donovan, PA-C  Elastic Bandages & Supports  (MEDICAL COMPRESSION STOCKINGS) MISC 25-30 mm Hg Patient not taking: Reported on 04/26/2017 01/09/16   Boykin Nearing, MD  ketotifen (ALAWAY) 0.025 % ophthalmic solution Place 1 drop into the left eye 2 (two) times daily. Patient not taking: Reported on 04/29/2017 04/26/17   Alfonse Spruce, FNP  levothyroxine (SYNTHROID, LEVOTHROID) 112 MCG tablet Take 1 tablet (112 mcg total) by mouth daily. 04/10/17   Alfonse Spruce, FNP  meloxicam (MOBIC) 15 MG tablet Take 1 tablet (15 mg total) by mouth daily. 08/21/17   Argentina Donovan, PA-C  methocarbamol (ROBAXIN) 500 MG tablet Take 1 tablet (500 mg total) by mouth every 8 (eight) hours as needed for muscle spasms. 08/21/17   Argentina Donovan, PA-C  omeprazole (PRILOSEC) 20 MG capsule Take 1 capsule (20 mg total) by mouth daily. Patient not taking: Reported on 04/26/2017 11/26/16   Carlis Stable, NP  traMADol (ULTRAM) 50 MG tablet Take 1 tablet (50 mg total) by mouth every 8 (eight) hours as needed for severe pain. 04/09/17   Alfonse Spruce, FNP  triamcinolone cream (KENALOG) 0.1 % Apply 1 application topically 2 (two) times daily. 08/21/17   Argentina Donovan, PA-C  Wheat Dextrin (BENEFIBER) POWD Take by mouth 2 (two) times daily. Takes 2 tbsp twice daily    [provider]     Objective:  EXAM:   Vitals:   08/21/17 1608  BP: 136/80  Pulse: 76  Resp: 16  Temp: 98.5 F (36.9 C)  TempSrc: Oral  SpO2: 98%  Weight: 164 lb (74.4 kg)    General appearance : A&OX3. NAD. Non-toxic-appearing HEENT: Atraumatic and Normocephalic.  PERRLA. EOM intact.  Neck: supple, no JVD. No cervical lymphadenopathy. No thyromegaly Chest/Lungs:  Breathing-non-labored, Good air entry bilaterally, breath sounds normal without rales, rhonchi, or wheezing  CVS: S1 S2 regular, no murmurs, gallops, rubs  Extremities: Bilateral Lower Ext shows no edema, both legs are warm to touch with = pulse throughout Neurology:  CN II-XII grossly intact, Non focal.     Psych:  TP linear. J/I WNL. Normal speech. Appropriate eye contact and affect.  Skin:  Dry eczematous skin at back/base of neck and bt breasts.  No yeast  Data Review No results found for: HGBA1C   Assessment & Plan   1. Eczema, unspecified type - cetirizine (ZYRTEC) 10 MG tablet; Take 1 tablet (10 mg total) by mouth daily. Prn itching  Dispense: 30 tablet; Refill: 2 - triamcinolone cream (KENALOG) 0.1 %; Apply 1 application topically 2 (two) times daily.  Dispense: 45 g; Refill: 0  2. Chronic arthritis - meloxicam (MOBIC) 15 MG tablet; Take 1 tablet (15 mg total) by mouth daily.  Dispense: 30 tablet; Refill: 6  3. Sciatica of right side Stop gabapentin - meloxicam (MOBIC) 15 MG tablet; Take 1 tablet (15 mg total) by mouth daily.  Dispense: 30 tablet; Refill: 6 - methocarbamol (ROBAXIN) 500 MG tablet; Take 1 tablet (500 mg total) by mouth every 8 (eight) hours as needed for muscle spasms.  Dispense: 90 tablet; Refill: 0     Patient have been counseled extensively about nutrition and exercise  Return for keep 09/13/2017 appt.  The patient was given clear instructions to go to ER or return to medical center if symptoms don't improve, worsen or new problems develop. The patient verbalized understanding. The patient was told to call to get lab results if they haven't heard anything in the next week.     Freeman Caldron, PA-C [facility] and [place], [place]   08/21/2017, 4:22 PM

## 2017-08-21 NOTE — Patient Instructions (Signed)
Dermatitis atpica Atopic Dermatitis La dermatitis atpica es un trastorno de la piel que causa inflamacin. Es el tipo ms frecuente de eczema. El eczema es un grupo de afecciones de la piel que causan picazn, enrojecimiento e hinchazn. Esta afeccin, generalmente, empeora durante los meses fros del invierno y suele mejorar durante los meses clidos del verano. Los sntomas pueden variar de Ardelia Mems persona a Theatre manager. La dermatitis atpica, normalmente, comienza a manifestarse en la infancia y puede durar hasta la [place]. Esta afeccin no puede transmitirse de [place] persona a otra (no es contagiosa), pero es ms comn en las familias. Es posible que la dermatitis atpica no siempre sea visible. Cuando es visible, se habla de un brote. Cules son las causas? Se desconoce la causa exacta de esta afeccin. Algunos factores desencadenantes de los brotes pueden ser los siguientes:  Contacto con [place] cosa a la que es sensible o Air cabin crew.  Psychologist, forensic.  Ciertos alimentos.  Clima extremadamente clido o fro.  Jabones y sustancias qumicas fuertes.  Aire seco.  Cloro.  Qu incrementa el riesgo? Esta afeccin es ms probable que [place] en personas que tienen antecedentes personales o familiares de eczema, alergias, asma o fiebre del heno. Cules son los signos o los sntomas? Los sntomas de esta afeccin [employer] siguientes:  Piel seca y escamosa.  Erupcin roja y que pica.  Picazn, que puede ser muy intensa. Puede ocurrir antes de la erupcin en la piel. Esto puede dificultar el sueo.  Engrosamiento y Paramedic de la piel que pueden producirse con Physiological scientist.  Cmo se diagnostica? Esta afeccin se diagnostica en funcin de los sntomas, los antecedentes mdicos y un examen fsico. Cmo se trata? No hay cura para esta afeccin, pero los sntomas, normalmente, se pueden controlar. El tratamiento se centra en lo siguiente:  Controlar la picazn y el rascado. Probablemente, le  receten medicamentos, como antihistamnicos o cremas corticoesteroides.  Limitar la exposicin a las cosas a las que es sensible o Air cabin crew (alrgenos).  Reconocer situaciones que causan estrs e idear un plan para controlarlo.  Si la dermatitis atpica no mejora con medicamentos o si est presente en todo el cuerpo (diseminada), puede utilizarse un tratamiento con un tipo de luz especfico (fototerapia). Siga estas indicaciones en su casa: Cuidado de la piel  Mantenga la piel bien humectada. Al hacerlo, quedar hmeda y ayudar a prevenir la sequedad. ? Utilice lociones sin perfume que contengan vaselina. ? Evite las lociones que contienen alcohol o agua. Pueden secar la piel.  Tome baos o duchas de corta duracin (menos de 5 minutos) en agua tibia. No use agua caliente. ? Use jabones suaves y sin perfume para baarse. Evite el jabn y el bao de espuma. ? Aplique un humectante para la piel inmediatamente despus de un bao o una ducha.  No aplique nada sobre la piel sin Teacher, adult education a su mdico. Instrucciones generales  Vstase con ropa de algodn o mezcla de algodn. Vstase con ropas ligeras, ya que el calor aumenta la picazn.  [place], enjuguela dos veces para eliminar todo el [place].  Evite cualquier factor desencadenante que pueda causar un brote.  Intente manejar el estrs.  [place] uas cortas.  Evite rascarse. El rascado hace que la erupcin y la picazn empeoren. Tambin puede producir una infeccin en la piel (imptigo) debido a las lesiones cutneas causadas por el rascado.  Tome o aplquese los medicamentos de venta libre y recetados solamente como se lo haya indicado el mdico.  [place] a  todas las visitas de [employer] se lo haya indicado el mdico. Esto es importante.  No est cerca de personas que tengan herpes labial o ampollas febriles. Si se produce la infeccin, puede hacer que la dermatitis atpica empeore. Comunquese con un mdico  si:  La picazn le impide dormir.  La erupcin empeora o no mejora en el plazo de una semana despus de iniciar el tratamiento.  Tiene fiebre.  Aparece un brote despus de estar en contacto con alguien que tiene herpes labial o ampollas febriles. Solicite ayuda de inmediato si:  Tiene pus o costras amarillas en la zona de la erupcin. Resumen  Esta afeccin causa una erupcin roja que pica, y la piel est seca y escamosa.  El tratamiento se enfoca en controlar la picazn y el rascado, limitar la exposicin a cosas a las que es sensible o Air cabin crew (alrgenos), reconocer situaciones que causan estrs e idear un plan para Engineer, maintenance (IT).  Mantenga la piel bien humectada.  Tome baos o duchas de menos de 5 minutos y use agua tibia. No use agua caliente. Esta informacin no tiene Marine scientist el consejo del mdico. Asegrese de hacerle al mdico cualquier pregunta que tenga. Document Released: 03/19/2005 Document Revised: 07/09/2016 Document Reviewed: 07/09/2016 Elsevier Interactive Patient Education  2018 [employer].

## 2017-09-05 ENCOUNTER — Telehealth: Payer: Self-pay | Admitting: Nurse Practitioner

## 2017-09-05 ENCOUNTER — Encounter: Payer: Self-pay | Admitting: Internal Medicine

## 2017-09-05 ENCOUNTER — Ambulatory Visit: Payer: Self-pay | Admitting: Nurse Practitioner

## 2017-09-05 NOTE — Telephone Encounter (Signed)
PATIENT WAS A NO SHOW AND LETTER SENT  °

## 2017-09-05 NOTE — Progress Notes (Deleted)
Referring Provider: Orlando Penner* Primary Care Physician:  Ladell Pier, MD Primary GI:  Dr. Gala Romney  No chief complaint on file.   HPI:   Alexandra Brewer is a 62 y.o. female who presents for follow-up on abdominal pain.  The patient was last seen in our office 01/23/2017 for GERD, chronic constipation, right upper quadrant abdominal pain.  3 of GERD and constipation.  Chronic hypothyroid with recent previous titration of Synthroid dose.  Her visits are assisted by system translator.  Linzess caused diarrhea historically.  Colonoscopy on file 2017 recommended repeat in 10 years.  At her last visit she was doing much better.  Again she stopped Linzess due to diarrhea and is having a bowel movement once a day which is regular and notes complete emptying.  Abdominal pain only with spicy food and seafood.  GERD resolved unless she eats spicy food.  Her pain is more "annoying, nagging" but "not really pain".  No other GI symptoms.  Recommended abdominal ultrasound, continue medications, follow-up in 3 months.  Right upper quadrant ultrasound completed 02/04/2017 which found no significant findings.  Today she states   Past Medical History:  Diagnosis Date  . Asthma   . Constipation   . Hyperlipemia   . Thyroid disease     Past Surgical History:  Procedure Laterality Date  . CATARACT EXTRACTION    . COLONOSCOPY N/A 10/10/2015   Procedure: COLONOSCOPY;  Surgeon: Daneil Dolin, MD;  [facility];  Service: Endoscopy;  Laterality: N/A;  245 - Interpreter scheduled, do NOT move    Current Outpatient Medications  Medication Sig Dispense Refill  . acetaminophen (TYLENOL) 500 MG tablet Take 2 tablets (1,000 mg total) by mouth every 8 (eight) hours as needed for mild pain or moderate pain. (Patient not taking: Reported on 04/29/2017) 30 tablet 1  . amLODipine (NORVASC) 5 MG tablet Take 1 tablet (5 mg total) by mouth daily. 30 tablet 6  . atorvastatin (LIPITOR) 20  MG tablet Take 1 tablet (20 mg total) by mouth daily. 30 tablet 6  . cetirizine (ZYRTEC) 10 MG tablet Take 1 tablet (10 mg total) by mouth daily. Prn itching 30 tablet 2  . Elastic Bandages & Supports (MEDICAL COMPRESSION STOCKINGS) MISC 25-30 mm Hg (Patient not taking: Reported on 04/26/2017) 2 each 0  . ketotifen (ALAWAY) 0.025 % ophthalmic solution Place 1 drop into the left eye 2 (two) times daily. (Patient not taking: Reported on 04/29/2017) 5 mL 0  . levothyroxine (SYNTHROID, LEVOTHROID) 112 MCG tablet Take 1 tablet (112 mcg total) by mouth daily. 30 tablet 2  . meloxicam (MOBIC) 15 MG tablet Take 1 tablet (15 mg total) by mouth daily. 30 tablet 6  . methocarbamol (ROBAXIN) 500 MG tablet Take 1 tablet (500 mg total) by mouth every 8 (eight) hours as needed for muscle spasms. 90 tablet 0  . omeprazole (PRILOSEC) 20 MG capsule Take 1 capsule (20 mg total) by mouth daily. (Patient not taking: Reported on 04/26/2017) 90 capsule 3  . traMADol (ULTRAM) 50 MG tablet Take 1 tablet (50 mg total) by mouth every 8 (eight) hours as needed for severe pain. 40 tablet 0  . triamcinolone cream (KENALOG) 0.1 % Apply 1 application topically 2 (two) times daily. 45 g 0  . Wheat Dextrin (BENEFIBER) POWD Take by mouth 2 (two) times daily. Takes 2 tbsp twice daily     No current facility-administered medications for this visit.     Allergies as  of 09/05/2017 - Review Complete 08/21/2017  Allergen Reaction Noted  . Fish allergy  06/11/2017  . Penicillins  07/20/2015  . Shellfish allergy  06/11/2017    Family History  Problem Relation Age of Onset  . Diabetes Father   . Hypertension Father   . Stroke Maternal Grandmother   . Stroke Maternal Grandfather   . Colon cancer Neg Hx     Social History   Socioeconomic History  . Marital status: Single    Spouse name: Not on file  . Number of children: Not on file  . Years of education: Not on file  . Highest education level: Not on file  Occupational  History  . Not on file  Social Needs  . Financial resource strain: Not on file  . Food insecurity:    Worry: Not on file    Inability: Not on file  . Transportation needs:    Medical: Not on file    Non-medical: Not on file  Tobacco Use  . Smoking status: Never Smoker  . Smokeless tobacco: Never Used  Substance and Sexual Activity  . Alcohol use: No    Alcohol/week: 0.0 oz  . Drug use: No  . Sexual activity: Not Currently  Lifestyle  . Physical activity:    Days per week: Not on file    Minutes per session: Not on file  . Stress: Not on file  Relationships  . Social connections:    Talks on phone: Not on file    Gets together: Not on file    Attends religious service: Not on file    Active member of club or organization: Not on file    Attends meetings of clubs or organizations: Not on file    Relationship status: Not on file  Other Topics Concern  . Not on file  Social History Narrative  . Not on file    Review of Systems: General: Negative for anorexia, weight loss, fever, chills, fatigue, weakness. Eyes: Negative for vision changes.  ENT: Negative for hoarseness, difficulty swallowing , nasal congestion. CV: Negative for chest pain, angina, palpitations, dyspnea on exertion, peripheral edema.  Respiratory: Negative for dyspnea at rest, dyspnea on exertion, cough, sputum, wheezing.  GI: See history of present illness. GU:  Negative for dysuria, hematuria, urinary incontinence, urinary frequency, nocturnal urination.  MS: Negative for joint pain, low back pain.  Derm: Negative for rash or itching.  Neuro: Negative for weakness, abnormal sensation, seizure, frequent headaches, memory loss, confusion.  Psych: Negative for anxiety, depression, suicidal ideation, hallucinations.  Endo: Negative for unusual weight change.  Heme: Negative for bruising or bleeding. Allergy: Negative for rash or hives.   Physical Exam: There were no vitals taken for this  visit. General:   Alert and oriented. Pleasant and cooperative. Well-nourished and well-developed.  Head:  Normocephalic and atraumatic. Eyes:  Without icterus, sclera clear and conjunctiva pink.  Ears:  Normal auditory acuity. Mouth:  No deformity or lesions, oral mucosa pink.  Throat/Neck:  Supple, without mass or thyromegaly. Cardiovascular:  S1, S2 present without murmurs appreciated. Normal pulses noted. Extremities without clubbing or edema. Respiratory:  Clear to auscultation bilaterally. No wheezes, rales, or rhonchi. No distress.  Gastrointestinal:  +BS, soft, non-tender and non-distended. No HSM noted. No guarding or rebound. No masses appreciated.  Rectal:  Deferred  Musculoskalatal:  Symmetrical without gross deformities. Normal posture. Skin:  Intact without significant lesions or rashes. Neurologic:  Alert and oriented x4;  grossly normal neurologically. Psych:  Alert  and cooperative. Normal mood and affect. Heme/Lymph/Immune: No significant cervical adenopathy. No excessive bruising noted.    09/05/2017 7:50 AM   Disclaimer: This note was dictated with voice recognition software. Similar sounding words can inadvertently be transcribed and may not be corrected upon review.

## 2017-09-06 NOTE — Telephone Encounter (Signed)
Noted  

## 2017-09-13 ENCOUNTER — Encounter: Payer: Self-pay | Admitting: Internal Medicine

## 2017-09-13 ENCOUNTER — Ambulatory Visit: Payer: Self-pay | Attending: Internal Medicine | Admitting: Internal Medicine

## 2017-09-13 VITALS — BP 156/78 | HR 66 | Temp 98.5°F | Resp 16 | Wt 164.6 lb

## 2017-09-13 DIAGNOSIS — Z131 Encounter for screening for diabetes mellitus: Secondary | ICD-10-CM | POA: Insufficient documentation

## 2017-09-13 DIAGNOSIS — K219 Gastro-esophageal reflux disease without esophagitis: Secondary | ICD-10-CM | POA: Insufficient documentation

## 2017-09-13 DIAGNOSIS — Z8249 Family history of ischemic heart disease and other diseases of the circulatory system: Secondary | ICD-10-CM | POA: Insufficient documentation

## 2017-09-13 DIAGNOSIS — E039 Hypothyroidism, unspecified: Secondary | ICD-10-CM | POA: Insufficient documentation

## 2017-09-13 DIAGNOSIS — N3281 Overactive bladder: Secondary | ICD-10-CM | POA: Insufficient documentation

## 2017-09-13 DIAGNOSIS — Z79890 Hormone replacement therapy (postmenopausal): Secondary | ICD-10-CM | POA: Insufficient documentation

## 2017-09-13 DIAGNOSIS — M5431 Sciatica, right side: Secondary | ICD-10-CM

## 2017-09-13 DIAGNOSIS — K5909 Other constipation: Secondary | ICD-10-CM | POA: Insufficient documentation

## 2017-09-13 DIAGNOSIS — R350 Frequency of micturition: Secondary | ICD-10-CM

## 2017-09-13 DIAGNOSIS — E785 Hyperlipidemia, unspecified: Secondary | ICD-10-CM | POA: Insufficient documentation

## 2017-09-13 DIAGNOSIS — Z880 Allergy status to penicillin: Secondary | ICD-10-CM | POA: Insufficient documentation

## 2017-09-13 DIAGNOSIS — M5441 Lumbago with sciatica, right side: Secondary | ICD-10-CM | POA: Insufficient documentation

## 2017-09-13 DIAGNOSIS — Z833 Family history of diabetes mellitus: Secondary | ICD-10-CM | POA: Insufficient documentation

## 2017-09-13 DIAGNOSIS — I10 Essential (primary) hypertension: Secondary | ICD-10-CM | POA: Insufficient documentation

## 2017-09-13 DIAGNOSIS — R358 Other polyuria: Secondary | ICD-10-CM

## 2017-09-13 DIAGNOSIS — Z79899 Other long term (current) drug therapy: Secondary | ICD-10-CM | POA: Insufficient documentation

## 2017-09-13 DIAGNOSIS — M7501 Adhesive capsulitis of right shoulder: Secondary | ICD-10-CM | POA: Insufficient documentation

## 2017-09-13 DIAGNOSIS — R3589 Other polyuria: Secondary | ICD-10-CM

## 2017-09-13 DIAGNOSIS — K5730 Diverticulosis of large intestine without perforation or abscess without bleeding: Secondary | ICD-10-CM | POA: Insufficient documentation

## 2017-09-13 LAB — POCT GLYCOSYLATED HEMOGLOBIN (HGB A1C): Hemoglobin A1C: 5.4 % (ref 4.0–5.6)

## 2017-09-13 LAB — POCT URINALYSIS DIPSTICK
Bilirubin, UA: NEGATIVE
GLUCOSE UA: NEGATIVE
KETONES UA: NEGATIVE
Nitrite, UA: NEGATIVE
Protein, UA: NEGATIVE
SPEC GRAV UA: 1.01 (ref 1.010–1.025)
Urobilinogen, UA: 0.2 E.U./dL
pH, UA: 5.5 (ref 5.0–8.0)

## 2017-09-13 LAB — GLUCOSE, POCT (MANUAL RESULT ENTRY): POC GLUCOSE: 86 mg/dL (ref 70–99)

## 2017-09-13 MED ORDER — SOLIFENACIN SUCCINATE 5 MG PO TABS
5.0000 mg | ORAL_TABLET | Freq: Every day | ORAL | 1 refills | Status: DC
Start: 2017-09-13 — End: 2018-03-10

## 2017-09-13 NOTE — Progress Notes (Signed)
Patient ID: Alexandra Brewer, female    DOB: 1955-10-06  MRN: [id number]  CC: Hypertension   Subjective: Alexandra Brewer is a 62 y.o. female who presents for chronic ds management Her concerns today include:  HTN, thyroid ds, HL, GERD, Frozen shoulder RT  Still gets intermittent flare of burning pain Rt leg.  Feels that pain originates in lower back and radiates to foot -worse in mornings and evenings after work -feeling of weakness in the leg.  No loss of bowel or bladder function -did not tolerate Gabapentin.  Caused dizziness.  Tolerating meloxicam and Robaxin.  C/o urinary frequency and urgency over past 2-3 mths.  No dysuria.  Sometimes it feels that she will have incontinence of her urine if she does not get to the bathroom immediately. Drinking a lot of water.  She denies drinking a lot of caffeinated beverages during the day. Pos Fhx of DM   HTN: She has not taken her blood pressure medicine as yet for today.  She will take the Norvasc when she returns home. Patient Active Problem List   Diagnosis Date Noted  . Sciatica of right side 06/11/2017  . GERD (gastroesophageal reflux disease) 11/26/2016  . Right Achilles tendinitis 08/15/2016  . Hypertension 07/05/2016  . Adhesive capsulitis of right shoulder 02/21/2016  . Heel pain, bilateral 01/10/2016  . 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 History of colonic polyps   . Diverticulosis of colon without hemorrhage   . Hypothyroidism 08/10/2015  . Chronic constipation 08/05/2015  . Hyperlipidemia 08/05/2015     Current Outpatient Medications on File Prior to Visit  Medication Sig Dispense Refill  . acetaminophen (TYLENOL) 500 MG tablet Take 2 tablets (1,000 mg total) by mouth every 8 (eight) hours as needed for mild pain or moderate pain. (Patient not taking: Reported on 04/29/2017) 30 tablet 1  . amLODipine (NORVASC) 5 MG tablet Take 1 tablet (5 mg total) by mouth daily. 30 tablet 6  .  atorvastatin (LIPITOR) 20 MG tablet Take 1 tablet (20 mg total) by mouth daily. 30 tablet 6  . cetirizine (ZYRTEC) 10 MG tablet Take 1 tablet (10 mg total) by mouth daily. Prn itching 30 tablet 2  . Elastic Bandages & Supports (MEDICAL COMPRESSION STOCKINGS) MISC 25-30 mm Hg (Patient not taking: Reported on 04/26/2017) 2 each 0  . ketotifen (ALAWAY) 0.025 % ophthalmic solution Place 1 drop into the left eye 2 (two) times daily. (Patient not taking: Reported on 04/29/2017) 5 mL 0  . levothyroxine (SYNTHROID, LEVOTHROID) 112 MCG tablet Take 1 tablet (112 mcg total) by mouth daily. 30 tablet 2  . meloxicam (MOBIC) 15 MG tablet Take 1 tablet (15 mg total) by mouth daily. 30 tablet 6  . methocarbamol (ROBAXIN) 500 MG tablet Take 1 tablet (500 mg total) by mouth every 8 (eight) hours as needed for muscle spasms. 90 tablet 0  . omeprazole (PRILOSEC) 20 MG capsule Take 1 capsule (20 mg total) by mouth daily. (Patient not taking: Reported on 04/26/2017) 90 capsule 3  . traMADol (ULTRAM) 50 MG tablet Take 1 tablet (50 mg total) by mouth every 8 (eight) hours as needed for severe pain. (Patient not taking: Reported on 09/13/2017) 40 tablet 0  . triamcinolone cream (KENALOG) 0.1 % Apply 1 application topically 2 (two) times daily. 45 g 0  . Wheat Dextrin (BENEFIBER) POWD Take by mouth 2 (two) times daily. Takes 2 tbsp twice daily     No current facility-administered medications on  file prior to visit.     Allergies  Allergen Reactions  . Fish Allergy     Throat swell up  . Penicillins     Has patient had a PCN reaction causing immediate rash, facial/tongue/throat swelling, SOB or lightheadedness with hypotension: Yes/No:[id number] unknown Has patient had a PCN reaction causing severe rash involving mucus membranes or skin necrosis: Yes/No:[id number] unknown Has patient had a PCN reaction that required hospitalization Yes/No:[id number] unknown Has patient had a PCN reaction occurring within the last 10 years:  Yes/No:[id number] If all of the above answers are "NO", then may proceed with C  . Shellfish Allergy     Throat swell up    Social History   Socioeconomic History  . Marital status: Single    Spouse name: Not on file  . Number of children: Not on file  . Years of education: Not on file  . Highest education level: Not on file  Occupational History  . Not on file  Social Needs  . Financial resource strain: Not on file  . Food insecurity:    Worry: Not on file    Inability: Not on file  . Transportation needs:    Medical: Not on file    Non-medical: Not on file  Tobacco Use  . Smoking status: Never Smoker  . Smokeless tobacco: Never Used  Substance and Sexual Activity  . Alcohol use: No    Alcohol/week: 0.0 oz  . Drug use: No  . Sexual activity: Not Currently  Lifestyle  . Physical activity:    Days per week: Not on file    Minutes per session: Not on file  . Stress: Not on file  Relationships  . Social connections:    Talks on phone: Not on file    Gets together: Not on file    Attends religious service: Not on file    Active member of club or organization: Not on file    Attends meetings of clubs or organizations: Not on file    Relationship status: Not on file  . Intimate partner violence:    Fear of current or ex partner: Not on file    Emotionally abused: Not on file    Physically abused: Not on file    Forced sexual activity: Not on file  Other Topics Concern  . Not on file  Social History Narrative  . Not on file    Family History  Problem Relation Age of Onset  . Diabetes Father   . Hypertension Father   . Stroke Maternal Grandmother   . Stroke Maternal Grandfather   . Colon cancer Neg Hx     Past Surgical History:  Procedure Laterality Date  . CATARACT EXTRACTION    . COLONOSCOPY N/A 10/10/2015   Procedure: COLONOSCOPY;  Surgeon: Daneil Dolin, MD;  [facility];  Service: Endoscopy;  Laterality: N/A;  245 - Interpreter scheduled, do  NOT move    ROS: Review of Systems Negative except as stated above. PHYSICAL EXAM: BP (!) 156/78   Pulse 66   Temp 98.5 F (36.9 C) (Oral)   Resp 16   Wt 164 lb 9.6 oz (74.7 kg)   SpO2 96%   BMI 32.15 kg/m   Wt Readings from Last 3 Encounters:  09/13/17 164 lb 9.6 oz (74.7 kg)  08/21/17 164 lb (74.4 kg)  06/11/17 161 lb 6.4 oz (73.2 kg)    Physical Exam  General appearance - alert, well appearing, and in no distress Mental status - normal  mood, behavior, speech, dress, motor activity, and thought processes Chest - clear to auscultation, no wheezes, rales or rhonchi, symmetric air entry Heart - normal rate, regular rhythm, normal S1, S2, no murmurs, rubs, clicks or gallops Musculoskeletal -mild tenderness in the paraspinal muscle right lumbar region.  Power in both lower extremities 5/5 bilaterally.  Gross and fine sensation intact. Extremities -no lower extremity edema  Results for orders placed or performed in visit on 09/13/17  POCT glucose (manual entry)  Result Value Ref Range   POC Glucose 86 70 - 99 mg/dl  POCT glycosylated hemoglobin (Hb A1C)  Result Value Ref Range   Hemoglobin A1C 5.4 4.0 - 5.6 %   HbA1c, POC (prediabetic range)  5.7 - 6.4 %   HbA1c, POC (controlled diabetic range)  0.0 - 7.0 %  POCT urinalysis dipstick  Result Value Ref Range   Color, UA yellow    Clarity, UA clear    Glucose, UA Negative Negative   Bilirubin, UA negative    Ketones, UA negative    Spec Grav, UA 1.010 1.010 - 1.025   Blood, UA trace-intact    pH, UA 5.5 5.0 - 8.0   Protein, UA Negative Negative   Urobilinogen, UA 0.2 0.2 or 1.0 E.U./dL   Nitrite, UA negative    Leukocytes, UA Trace (A) Negative   Appearance     Odor      ASSESSMENT AND PLAN: 1. Sciatica of right side -She has had more than 6 weeks of conservative therapy and this is still an issue for her.  We will image.  Further management will be based on results. - MR Lumbar Spine Wo Contrast; Future  2.  Essential hypertension Not at goal.  Patient to take medication when she returns home.  3. Frequency of urination and polyuria - POCT urinalysis dipstick  4. Diabetes mellitus screening Negative screen - POCT glucose (manual entry) - POCT glycosylated hemoglobin (Hb A1C)  5. Overactive bladder We will give a trial of low-dose Vesicare.  Patient advised to stop the medication if she develops urinary retention. - solifenacin (VESICARE) 5 MG tablet; Take 1 tablet (5 mg total) by mouth daily.  Dispense: 30 tablet; Refill: 1  Patient was given the opportunity to ask questions.  Patient verbalized understanding of the plan and was able to repeat key elements of the plan.  [employer] interpreter used during this encounter.  No orders of the defined types were placed in this encounter.    Requested Prescriptions    No prescriptions requested or ordered in this encounter    No follow-ups on file.  Karle Plumber, MD, FACP

## 2017-09-16 MED FILL — SOLIFENACIN SUCCINATE 5 MG: 5 | 30 days supply | Qty: 30 | Fill #0

## 2017-09-21 ENCOUNTER — Ambulatory Visit (HOSPITAL_COMMUNITY): Payer: Self-pay

## 2017-09-30 ENCOUNTER — Ambulatory Visit (HOSPITAL_COMMUNITY): Admission: RE | Admit: 2017-09-30 | Payer: Self-pay | Source: Ambulatory Visit

## 2017-10-07 ENCOUNTER — Ambulatory Visit (HOSPITAL_COMMUNITY)
Admission: RE | Admit: 2017-10-07 | Discharge: 2017-10-07 | Disposition: A | Discharge status: Home / Self Care | Payer: Self-pay | Source: Ambulatory Visit | Attending: Internal Medicine | Admitting: Internal Medicine

## 2017-10-07 DIAGNOSIS — M5431 Sciatica, right side: Secondary | ICD-10-CM | POA: Insufficient documentation

## 2017-10-07 DIAGNOSIS — M5137 Other intervertebral disc degeneration, lumbosacral region: Secondary | ICD-10-CM | POA: Insufficient documentation

## 2017-10-09 ENCOUNTER — Telehealth: Payer: Self-pay

## 2017-10-09 NOTE — Telephone Encounter (Signed)
[employer] [facility] [facility] Id# [phone] contacted pt to go over lab results pt didn't answer left a detailed vm informing pt of results and if she can give [place] a call letting us know if physical therapy is okay and if she has any questions or concerns to give me a call  If pt calls back please give results: x-ray revealed one mild bulging disc and degenerative disc in the lower back. No nerve compression seen to explain RT leg symptom. We can refer her to physical therapy. Let me know if she is agreeable to that.

## 2017-11-06 ENCOUNTER — Telehealth: Payer: Self-pay | Admitting: Internal Medicine

## 2017-11-06 DIAGNOSIS — E039 Hypothyroidism, unspecified: Secondary | ICD-10-CM

## 2017-11-06 NOTE — Telephone Encounter (Signed)
Patient came to the facility requesting a refill on levothyroxine (SYNTHROID, LEVOTHROID) 112 MCG tablet  Patient uses [facility] pharmacy. Please f/u

## 2017-11-07 MED ORDER — LEVOTHYROXINE SODIUM 112 MCG PO TABS: 112.0000 ug | ORAL_TABLET | Freq: Every day | ORAL | 2 refills | Status: DC

## 2017-11-18 MED FILL — LEVOTHYROXINE 112 MCG TAB: 112 | 30 days supply | Qty: 30 | Fill #0

## 2017-11-26 MED FILL — LEVOTHYROXINE 112 MCG TAB: 112 | 30 days supply | Qty: 30 | Fill #1

## 2017-12-16 ENCOUNTER — Ambulatory Visit: Payer: Self-pay | Attending: Internal Medicine | Admitting: Internal Medicine

## 2017-12-16 ENCOUNTER — Encounter: Payer: Self-pay | Admitting: Internal Medicine

## 2017-12-16 VITALS — BP 138/83 | HR 75 | Temp 98.4°F | Resp 16 | Wt 163.8 lb

## 2017-12-16 DIAGNOSIS — K219 Gastro-esophageal reflux disease without esophagitis: Secondary | ICD-10-CM | POA: Insufficient documentation

## 2017-12-16 DIAGNOSIS — Z23 Encounter for immunization: Secondary | ICD-10-CM | POA: Insufficient documentation

## 2017-12-16 DIAGNOSIS — M79671 Pain in right foot: Secondary | ICD-10-CM | POA: Insufficient documentation

## 2017-12-16 DIAGNOSIS — Z8249 Family history of ischemic heart disease and other diseases of the circulatory system: Secondary | ICD-10-CM | POA: Insufficient documentation

## 2017-12-16 DIAGNOSIS — Z880 Allergy status to penicillin: Secondary | ICD-10-CM | POA: Insufficient documentation

## 2017-12-16 DIAGNOSIS — Z791 Long term (current) use of non-steroidal anti-inflammatories (NSAID): Secondary | ICD-10-CM | POA: Insufficient documentation

## 2017-12-16 DIAGNOSIS — Z79890 Hormone replacement therapy (postmenopausal): Secondary | ICD-10-CM | POA: Insufficient documentation

## 2017-12-16 DIAGNOSIS — M7501 Adhesive capsulitis of right shoulder: Secondary | ICD-10-CM | POA: Insufficient documentation

## 2017-12-16 DIAGNOSIS — M5431 Sciatica, right side: Secondary | ICD-10-CM | POA: Insufficient documentation

## 2017-12-16 DIAGNOSIS — Z79899 Other long term (current) drug therapy: Secondary | ICD-10-CM | POA: Insufficient documentation

## 2017-12-16 DIAGNOSIS — I10 Essential (primary) hypertension: Secondary | ICD-10-CM | POA: Insufficient documentation

## 2017-12-16 DIAGNOSIS — M79604 Pain in right leg: Secondary | ICD-10-CM | POA: Insufficient documentation

## 2017-12-16 DIAGNOSIS — E785 Hyperlipidemia, unspecified: Secondary | ICD-10-CM | POA: Insufficient documentation

## 2017-12-16 DIAGNOSIS — E034 Atrophy of thyroid (acquired): Secondary | ICD-10-CM

## 2017-12-16 DIAGNOSIS — M5136 Other intervertebral disc degeneration, lumbar region: Secondary | ICD-10-CM | POA: Insufficient documentation

## 2017-12-16 MED ORDER — OMEPRAZOLE 20 MG PO CPDR: 20.0000 mg | DELAYED_RELEASE_CAPSULE | Freq: Every day | ORAL | 3 refills | Status: DC

## 2017-12-16 NOTE — Progress Notes (Signed)
Patient ID: Alexandra Brewer, female    DOB: [birth year]  MRN: [id number]  CC: Hypertension   Subjective: Alexandra Brewer is a 62 y.o. female who presents for chronic disease management. Her concerns today include:  HTN, thyroid ds, HL, GERD, Frozen shoulder RT  Still has pain in the RT leg and feet MRI of lumbar revealed: IMPRESSION: 1. 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 Moderate left and mild right foraminal narrowing. 2. No static impingement to explain right leg symptoms. Plan is to refer to ortho and perhaps PT but pt has to renew [facility] discount  Would like to check thyroid. Throat a little sore and mild hoarsneness.   No heat or cold intolerance.  No major weight changes Taking Levothyroxine QOD.  Reports it gives her a stomach ache if she takes it on empty stomach first thing in morning.  She does not smoke  Vesicare sometimes causes stomach ache so she takes QOD.  Works better. Feels stomach is inflamed; gets burning in stomach .  Suppose to be on Omeprazole  HL:  Taking Lipitor QOD also.  Thinks it contributes to stomach being inflamed. Marland Kitchen  HTN:  Taking Norvasc QOD because "I;ve been feeling good."   Patient Active Problem List   Diagnosis Date Noted  . Sciatica of right side 06/11/2017  . GERD (gastroesophageal reflux disease) 11/26/2016  . Right Achilles tendinitis 08/15/2016  . Hypertension 07/05/2016  . Adhesive capsulitis of right shoulder 02/21/2016  . Heel pain, bilateral 01/10/2016  . 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 History of colonic polyps   . Diverticulosis of colon without hemorrhage   . Hypothyroidism 08/10/2015  . Chronic constipation 08/05/2015  . Hyperlipidemia 08/05/2015     Current Outpatient Medications on File Prior to Visit  Medication Sig Dispense Refill  . amLODipine (NORVASC) 5 MG tablet Take 1 tablet (5 mg total) by mouth daily. 30 tablet 6  .  atorvastatin (LIPITOR) 20 MG tablet Take 1 tablet (20 mg total) by mouth daily. 30 tablet 6  . cetirizine (ZYRTEC) 10 MG tablet Take 1 tablet (10 mg total) by mouth daily. Prn itching 30 tablet 2  . Elastic Bandages & Supports (MEDICAL COMPRESSION STOCKINGS) MISC 25-30 mm Hg (Patient not taking: Reported on 04/26/2017) 2 each 0  . levothyroxine (SYNTHROID, LEVOTHROID) 112 MCG tablet Take 1 tablet (112 mcg total) by mouth daily. 30 tablet 2  . meloxicam (MOBIC) 15 MG tablet Take 1 tablet (15 mg total) by mouth daily. 30 tablet 6  . methocarbamol (ROBAXIN) 500 MG tablet Take 1 tablet (500 mg total) by mouth every 8 (eight) hours as needed for muscle spasms. 90 tablet 0  . solifenacin (VESICARE) 5 MG tablet Take 1 tablet (5 mg total) by mouth daily. 30 tablet 1  . triamcinolone cream (KENALOG) 0.1 % Apply 1 application topically 2 (two) times daily. 45 g 0  . Wheat Dextrin (BENEFIBER) POWD Take by mouth 2 (two) times daily. Takes 2 tbsp twice daily     No current facility-administered medications on file prior to visit.     Allergies  Allergen Reactions  . Fish Allergy     Throat swell up  . Penicillins     Has patient had a PCN reaction causing immediate rash, facial/tongue/throat swelling, SOB or lightheadedness with hypotension: Yes/No:[id number] unknown Has patient had a PCN reaction causing severe rash involving mucus membranes or skin necrosis: Yes/No:[id number] unknown Has patient had a PCN reaction that  required hospitalization Yes/No:[id number] unknown Has patient had a PCN reaction occurring within the last 10 years: Yes/No:[id number] If all of the above answers are "NO", then may proceed with C  . Shellfish Allergy     Throat swell up    Social History   Socioeconomic History  . Marital status: Single    Spouse name: Not on file  . Number of children: Not on file  . Years of education: Not on file  . Highest education level: Not on file  Occupational History  . Not on file  Social  Needs  . Financial resource strain: Not on file  . Food insecurity:    Worry: Not on file    Inability: Not on file  . Transportation needs:    Medical: Not on file    Non-medical: Not on file  Tobacco Use  . Smoking status: Never Smoker  . Smokeless tobacco: Never Used  Substance and Sexual Activity  . Alcohol use: No    Alcohol/week: 0.0 standard drinks  . Drug use: No  . Sexual activity: Not Currently  Lifestyle  . Physical activity:    Days per week: Not on file    Minutes per session: Not on file  . Stress: Not on file  Relationships  . Social connections:    Talks on phone: Not on file    Gets together: Not on file    Attends religious service: Not on file    Active member of club or organization: Not on file    Attends meetings of clubs or organizations: Not on file    Relationship status: Not on file  . Intimate partner violence:    Fear of current or ex partner: Not on file    Emotionally abused: Not on file    Physically abused: Not on file    Forced sexual activity: Not on file  Other Topics Concern  . Not on file  Social History Narrative  . Not on file    Family History  Problem Relation Age of Onset  . Diabetes Father   . Hypertension Father   . Stroke Maternal Grandmother   . Stroke Maternal Grandfather   . Colon cancer Neg Hx     Past Surgical History:  Procedure Laterality Date  . CATARACT EXTRACTION    . COLONOSCOPY N/A 10/10/2015   Procedure: COLONOSCOPY;  Surgeon: Daneil Dolin, MD;  [facility];  Service: Endoscopy;  Laterality: N/A;  245 - Interpreter scheduled, do NOT move    ROS: Review of Systems Neg except as above  PHYSICAL EXAM: BP 138/83   Pulse 75   Temp 98.4 F (36.9 C) (Oral)   Resp 16   Wt 163 lb 12.8 oz (74.3 kg)   SpO2 99%   BMI 31.99 kg/m   Wt Readings from Last 3 Encounters:  12/16/17 163 lb 12.8 oz (74.3 kg)  09/13/17 164 lb 9.6 oz (74.7 kg)  08/21/17 164 lb (74.4 kg)    Physical  Exam  General appearance - alert, well appearing, and in no distress Mental status - normal mood, behavior, speech, dress, motor activity, and thought processes Mouth - mucous membranes moist, pharynx normal without lesions Neck - supple, no significant adenopathy Chest - clear to auscultation, no wheezes, rales or rhonchi, symmetric air entry Heart - normal rate, regular rhythm, normal S1, S2, no murmurs, rubs, clicks or gallops Extremities - peripheral pulses normal, no pedal edema, no clubbing or cyanosis  ASSESSMENT AND PLAN: 1. Essential  hypertension Not at goal of 130/80 or lower.  Encourage patient to take the amlodipine daily instead of every other day - CBC - Comprehensive metabolic panel  2. Hypothyroidism due to acquired atrophy of thyroid Encourage compliant with levothyroxine.  I told patient that if taking it on an empty stomach does not feel good then she can take it after breakfast. - TSH  3. Gastroesophageal reflux disease, esophagitis presence not specified GERD precautions discussed.  I recommend that she get back on the omeprazole since she is on a Cox 2 inhibitor. - omeprazole (PRILOSEC) 20 MG capsule; Take 1 capsule (20 mg total) by mouth daily.  Dispense: 90 capsule; Refill: 3  4. Hyperlipidemia, unspecified hyperlipidemia type Encourage compliance with Lipitor. - Lipid panel  5. Sciatica of right side - Ambulatory referral to Orthopedic Surgery  6. Need for immunization against influenza - Flu Vaccine QUAD 36+ mos IM   Patient was given the opportunity to ask questions.  Patient verbalized understanding of the plan and was able to repeat key elements of the plan.  [employer] interpreter used during this encounter. #[id number]  Orders Placed This Encounter  Procedures  . Flu Vaccine QUAD 36+ mos IM  . CBC  . Comprehensive metabolic panel  . Lipid panel  . TSH  . Ambulatory referral to Orthopedic Surgery     Requested Prescriptions   Signed  Prescriptions Disp Refills  . omeprazole (PRILOSEC) 20 MG capsule 90 capsule 3    Sig: Take 1 capsule (20 mg total) by mouth daily.    Return in about 4 months (around 04/17/2018).  Karle Plumber, MD, FACP

## 2017-12-16 NOTE — Patient Instructions (Signed)
Influenza Virus Vaccine injection (Fluarix) Qu es este medicamento? La VACUNA ANTIGRIPAL ayuda a disminuir el riesgo de contraer la influenza, tambin conocida como la gripe. La vacuna solo ayuda a protegerle contra algunas cepas de influenza. Esta vacuna no ayuda a reducir Catering manager de contraer influenza pandmica H1N1. Este medicamento puede ser utilizado para otros usos; si tiene alguna pregunta consulte con su proveedor de atencin mdica o con su farmacutico. MARCAS COMUNES: Fluarix, Fluzone Qu le debo informar a mi profesional de la salud antes de tomar este medicamento? Necesita saber si usted presenta alguno de los siguientes problemas o situaciones: -trastorno de sangrado como hemofilia -fiebre o infeccin -sndrome de Guillain-Barre u otros problemas neurolgicos -problemas del sistema inmunolgico -infeccin por el virus de la inmunodeficiencia humana (VIH) o SIDA -niveles bajos de plaquetas en la sangre -esclerosis mltiple -una Risk analyst o inusual a las vacunas antigripales, a los huevos, protenas de pollo, al ltex, a la gentamicina, a otros medicamentos, alimentos, colorantes o conservantes -si est embarazada o buscando quedar embarazada -si est amamantando a un beb Cmo debo utilizar este medicamento? Esta vacuna se administra mediante inyeccin por va intramuscular. Lo administra un profesional de [employer]	[place]. Recibir una copia de informacin escrita sobre la vacuna antes de cada vacuna. Asegrese de leer este folleto cada vez cuidadosamente. Este folleto puede cambiar con frecuencia. Hable con su pediatra para informarse acerca del uso de este medicamento en nios. Puede requerir atencin especial. Sobredosis: Pngase en contacto inmediatamente con un centro toxicolgico o una sala de urgencia si usted cree que haya tomado demasiado medicamento. ATENCIN: [employer] es solo para usted. No comparta este medicamento con nadie. Qu sucede si me olvido de  una dosis? No se aplica en este caso. Qu puede interactuar con este medicamento? -quimioterapia o radioterapia -medicamentos que suprimen el sistema inmunolgico, tales como etanercept, anakinra, infliximab y adalimumab -medicamentos que tratan o previenen cogulos sanguneos, como warfarina -fenitona -medicamentos esteroideos, como la prednisona o la cortisona -teofilina -vacunas Puede ser que esta lista no menciona todas las posibles interacciones. Informe a su profesional de [employer]	[place] de [employer] productos a base de hierbas, medicamentos de [place] o suplementos nutritivos que est tomando. Si usted fuma, consume bebidas alcohlicas o si utiliza drogas ilegales, indqueselo tambin a su profesional de [employer]	[place]. Algunas sustancias pueden interactuar con su medicamento. A qu debo estar atento al usar [employer]? Informe a su mdico o a Barrister's clerk de la [employer] todos los efectos secundarios que persistan despus de 3 das. Llame a su proveedor de atencin mdica si se presentan sntomas inusuales dentro de las 6 semanas posteriores a la vacunacin. Es posible que todava pueda contraer la gripe, pero la enfermedad no ser tan fuerte como normalmente. No puede contraer la gripe de esta vacuna. La vacuna antigripal no le protege contra resfros u otras enfermedades que pueden causar [place]. Debe vacunarse cada ao. Qu efectos secundarios puedo tener al [employer] este medicamento? Efectos secundarios que debe informar a su mdico o a Barrister's clerk de la salud tan pronto como sea posible: -reacciones alrgicas como erupcin cutnea, picazn o urticarias, hinchazn de la cara, labios o lengua Efectos secundarios que, por lo general, no requieren atencin mdica (debe informarlos a su mdico o a su profesional de la salud si persisten o si son molestos): -fiebre -dolor de cabeza -molestias y dolores musculares -dolor, sensibilidad, enrojecimiento o Estate agent de la  inyeccin -cansancio o debilidad Puede ser que esta lista no  menciona todos los posibles efectos secundarios. Comunquese a su mdico por asesoramiento mdico [employer]. Usted puede informar los efectos secundarios a la FDA por telfono al 1-800-FDA-1088. Dnde debo guardar mi medicina? Esta vacuna se administra solamente en clnicas, farmacias, consultorio mdico u otro consultorio de un profesional de la salud y no Sports coach en su domicilio. ATENCIN: Este folleto es un resumen. Puede ser que no cubra toda la posible informacin. Si usted tiene preguntas acerca de esta medicina, consulte con su mdico, su farmacutico o su profesional de Technical sales engineer.  2018 Elsevier/Gold Standard (2009-09-20 15:31:40)

## 2017-12-17 LAB — COMPREHENSIVE METABOLIC PANEL
A/G RATIO: 1.4 (ref 1.2–2.2)
ALBUMIN: 4.4 g/dL (ref 3.6–4.8)
ALT: 12 IU/L (ref 0–32)
AST: 17 IU/L (ref 0–40)
Alkaline Phosphatase: 109 IU/L (ref 39–117)
BUN / CREAT RATIO: 22 (ref 12–28)
BUN: 15 mg/dL (ref 8–27)
CHLORIDE: 100 mmol/L (ref 96–106)
CO2: 25 mmol/L (ref 20–29)
Calcium: 9.6 mg/dL (ref 8.7–10.3)
Creatinine, Ser: 0.67 mg/dL (ref 0.57–1.00)
GFR calc non Af Amer: 96 mL/min/{1.73_m2} (ref >59)
GFR, EST AFRICAN AMERICAN: 110 mL/min/{1.73_m2} (ref >59)
Globulin, Total: 3.1 g/dL (ref 1.5–4.5)
Glucose: 94 mg/dL (ref 65–99)
POTASSIUM: 4.3 mmol/L (ref 3.5–5.2)
Sodium: 141 mmol/L (ref 134–144)
TOTAL PROTEIN: 7.5 g/dL (ref 6.0–8.5)

## 2017-12-17 LAB — CBC
Hematocrit: 39 % (ref 34.0–46.6)
Hemoglobin: 12.9 g/dL (ref 11.1–15.9)
MCH: 28.5 pg (ref 26.6–33.0)
MCHC: 33.1 g/dL (ref 31.5–35.7)
MCV: 86 fL (ref 79–97)
PLATELETS: 377 10*3/uL (ref 150–450)
RBC: 4.52 x10E6/uL (ref 3.77–5.28)
RDW: 13 % (ref 12.3–15.4)
WBC: 8.5 10*3/uL (ref 3.4–10.8)

## 2017-12-17 LAB — LIPID PANEL
Chol/HDL Ratio: 4.8 ratio — ABNORMAL HIGH (ref 0.0–4.4)
Cholesterol, Total: 219 mg/dL — ABNORMAL HIGH (ref 100–199)
HDL: 46 mg/dL (ref >39)
LDL Calculated: 127 mg/dL — ABNORMAL HIGH (ref 0–99)
Triglycerides: 231 mg/dL — ABNORMAL HIGH (ref 0–149)
VLDL Cholesterol Cal: 46 mg/dL — ABNORMAL HIGH (ref 5–40)

## 2017-12-17 LAB — TSH: TSH: 8.7 u[IU]/mL — AB (ref 0.450–4.500)

## 2017-12-18 ENCOUNTER — Telehealth: Payer: Self-pay

## 2017-12-18 NOTE — Telephone Encounter (Signed)
[place]  Id# [phone]  contacted pt to go over lab results pt didn't answer left a detailed vm informing pt of results and if she has any questions or concerns to give me a call   If pt calls back please give results:  cholesterol level is elevated and thyroid level is abnormal. Encouraged her to take her cholesterol medication and thyroid medications every day as discussed on recent visit. Blood count is normal. Kidney and liver function tests are normal.

## 2017-12-25 MED FILL — MELOXICAM 15 MG TABLET: 15 | 30 days supply | Qty: 30 | Fill #1

## 2018-01-01 ENCOUNTER — Ambulatory Visit: Payer: Self-pay | Attending: Family Medicine

## 2018-01-03 ENCOUNTER — Ambulatory Visit (INDEPENDENT_AMBULATORY_CARE_PROVIDER_SITE_OTHER): Payer: Self-pay | Admitting: Specialist

## 2018-02-03 ENCOUNTER — Telehealth: Payer: Self-pay | Admitting: Internal Medicine

## 2018-02-03 MED FILL — AMLODIPINE BESYLATE 5 MG TA: 5 | 30 days supply | Qty: 30 | Fill #0

## 2018-02-03 MED FILL — MELOXICAM 15 MG TABLET: 15 | 30 days supply | Qty: 30 | Fill #2

## 2018-02-03 MED FILL — LEVOTHYROXINE 112 MCG TAB: 112 | 30 days supply | Qty: 30 | Fill #2

## 2018-02-03 NOTE — Telephone Encounter (Signed)
Pt had RX at the pharmacy, the refill was submitted and should be ready for pickup later today

## 2018-02-03 NOTE — Telephone Encounter (Signed)
Patient would like to know if she could get refills for amlodipine. She would like it sent to the [facility] pharmacy

## 2018-02-10 ENCOUNTER — Encounter (INDEPENDENT_AMBULATORY_CARE_PROVIDER_SITE_OTHER): Payer: Self-pay | Admitting: Specialist

## 2018-02-10 ENCOUNTER — Ambulatory Visit (INDEPENDENT_AMBULATORY_CARE_PROVIDER_SITE_OTHER): Payer: Self-pay | Admitting: Specialist

## 2018-02-10 ENCOUNTER — Ambulatory Visit (INDEPENDENT_AMBULATORY_CARE_PROVIDER_SITE_OTHER): Payer: Self-pay

## 2018-02-10 ENCOUNTER — Encounter: Payer: Self-pay | Admitting: Nurse Practitioner

## 2018-02-10 ENCOUNTER — Ambulatory Visit: Payer: Self-pay | Attending: Nurse Practitioner | Admitting: Nurse Practitioner

## 2018-02-10 VITALS — BP 127/80 | HR 63 | Temp 98.4°F | Ht 60.0 in | Wt 168.0 lb

## 2018-02-10 VITALS — BP 138/74 | HR 81 | Ht 60.5 in | Wt 168.0 lb

## 2018-02-10 DIAGNOSIS — M722 Plantar fascial fibromatosis: Secondary | ICD-10-CM

## 2018-02-10 DIAGNOSIS — E079 Disorder of thyroid, unspecified: Secondary | ICD-10-CM | POA: Insufficient documentation

## 2018-02-10 DIAGNOSIS — M5441 Lumbago with sciatica, right side: Secondary | ICD-10-CM

## 2018-02-10 DIAGNOSIS — Z8249 Family history of ischemic heart disease and other diseases of the circulatory system: Secondary | ICD-10-CM | POA: Insufficient documentation

## 2018-02-10 DIAGNOSIS — Z79899 Other long term (current) drug therapy: Secondary | ICD-10-CM | POA: Insufficient documentation

## 2018-02-10 DIAGNOSIS — Z880 Allergy status to penicillin: Secondary | ICD-10-CM | POA: Insufficient documentation

## 2018-02-10 DIAGNOSIS — Z833 Family history of diabetes mellitus: Secondary | ICD-10-CM | POA: Insufficient documentation

## 2018-02-10 DIAGNOSIS — E785 Hyperlipidemia, unspecified: Secondary | ICD-10-CM | POA: Insufficient documentation

## 2018-02-10 DIAGNOSIS — M67441 Ganglion, right hand: Secondary | ICD-10-CM | POA: Insufficient documentation

## 2018-02-10 DIAGNOSIS — Z79890 Hormone replacement therapy (postmenopausal): Secondary | ICD-10-CM | POA: Insufficient documentation

## 2018-02-10 DIAGNOSIS — M6740 Ganglion, unspecified site: Secondary | ICD-10-CM

## 2018-02-10 DIAGNOSIS — G8929 Other chronic pain: Secondary | ICD-10-CM

## 2018-02-10 DIAGNOSIS — M4307 Spondylolysis, lumbosacral region: Secondary | ICD-10-CM

## 2018-02-10 DIAGNOSIS — Z91013 Allergy to seafood: Secondary | ICD-10-CM | POA: Insufficient documentation

## 2018-02-10 DIAGNOSIS — M4316 Spondylolisthesis, lumbar region: Secondary | ICD-10-CM

## 2018-02-10 MED ORDER — GABAPENTIN 300 MG PO CAPS: ORAL_CAPSULE | ORAL | 0 refills | Status: DC

## 2018-02-10 MED ORDER — DICLOFENAC SODIUM 1 % TD GEL: 2.0000 g | Freq: Four times a day (QID) | TRANSDERMAL | 3 refills | Status: DC

## 2018-02-10 MED ORDER — MELOXICAM 15 MG PO TABS: 15.0000 mg | ORAL_TABLET | Freq: Every day | ORAL | 3 refills | Status: AC

## 2018-02-10 NOTE — Progress Notes (Signed)
Office Visit Note   Patient: Alexandra Brewer           Date of Birth: Nov 23, 1955           MRN: [id number] Visit Date: 02/10/2018              Requested by: Ladell Pier, MD [address] [place], [place] [address] PCP: Ladell Pier, MD   Assessment & Plan: Visit Diagnoses:  1. Chronic right-sided low back pain with right-sided sciatica   2. Spondylolisthesis, lumbar region   3. Spondylolysis of lumbosacral region   4. Plantar fasciitis of right foot     Plan: Avoid bending, stooping and avoid lifting weights greater than 10 lbs. Avoid prolong standing and walking. Avoid frequent bending and stooping  No lifting greater than 10 lbs. May use ice or moist heat for pain. Weight loss is of benefit. Handicap license is approved. Right heel cord stretching and apply voltaren gel to the heel 3-4 times per day 2-4 grams. Take Meloxicam for antiinflammation. Go to Physical Therapy For exercises for the lumbar spine and right heel. Obtain a tennis shoe that is well padded in the heel and has an adequate arch support. NuBalance, Easy Spirits  Are helpful or Reebocks.  Follow-Up Instructions: Return in about 6 weeks (around 03/24/2018).   Orders:  Orders Placed This Encounter  Procedures  . XR Lumbar Spine 2-3 Views  . Ambulatory referral to Physical Therapy   Meds ordered this encounter  Medications  . diclofenac sodium (VOLTAREN) 1 % GEL    Sig: Apply 2 g topically 4 (four) times daily.    Dispense:  3 Tube    Refill:  3  . gabapentin (NEURONTIN) 300 MG capsule    Sig: Take 1 capsule (300 mg total) by mouth at bedtime for 7 days, THEN 1 capsule (300 mg total) 2 (two) times daily.    Dispense:  67 capsule    Refill:  0  . meloxicam (MOBIC) 15 MG tablet    Sig: Take 1 tablet (15 mg total) by mouth daily.    Dispense:  30 tablet    Refill:  3      Procedures: No procedures performed   Clinical Data: No additional findings.   Subjective: Chief  Complaint  Patient presents with  . Lower Back - Pain  . Right Leg - Pain    62  Year old female with history of low back pain with some radiation right hip into the right heel. The pain is present into the heel constantly. Dailly pain in the leg thigh and right heel.When she first arises from standing up and from lying down. The pain is also in the right posterior calf and radiates down the right leg. No bowel or bladder. She has some history of problems the bowel or bladder. She is seen doctors concerning problems with bladder and bowel constipation and pain with moving her bowels. Her bladder is one that she is going to the bathroom frequently. 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 She has no diabetes, but thyroid concerns for the past 2 years  [employer] Interpreting service: Peter Congo ID # [phone]  Review of Systems  Constitutional: Positive for activity change. Negative for appetite change, chills, diaphoresis, fatigue and unexpected weight change.  HENT: Negative.   Eyes: Negative.   Respiratory: Negative.  Negative for apnea, cough, choking,  chest tightness, shortness of breath, wheezing and stridor.   Cardiovascular: Negative.  Negative for chest pain, palpitations and leg swelling.  Gastrointestinal: Positive for constipation. Negative for abdominal distention, abdominal pain, anal bleeding, blood in stool, diarrhea, nausea and rectal pain.  Endocrine: Negative.   Genitourinary: Positive for frequency. Negative for difficulty urinating, dyspareunia, dysuria, enuresis and flank pain.  Musculoskeletal: Positive for back pain. Negative for arthralgias, gait problem, joint swelling, myalgias, neck pain and neck stiffness.  Skin: Positive for wound. Negative for color change, pallor and rash.  Allergic/Immunologic: Negative.   Neurological: Negative.   Hematological: Negative.     Psychiatric/Behavioral: Negative.      Objective: Vital Signs: BP 138/74 (BP Location: Left Arm, Patient Position: Sitting)   Pulse 81   Ht 5' 0.5" (1.537 m)   Wt 168 lb (76.2 kg)   BMI 32.27 kg/m   Physical Exam  Constitutional: She is oriented to person, place, and time. She appears well-developed and well-nourished.  HENT:  Head: Normocephalic and atraumatic.  Eyes: Pupils are equal, round, and reactive to light. EOM are normal.  Neck: Normal range of motion. Neck supple.  Pulmonary/Chest: Effort normal and breath sounds normal.  Abdominal: Soft. Bowel sounds are normal.  Neurological: She is alert and oriented to person, place, and time.  Skin: Skin is warm and dry.  Psychiatric: She has a normal mood and affect. Her behavior is normal. Judgment and thought content normal.    Back Exam   Tenderness  The patient is experiencing tenderness in the lumbar.  Range of Motion  Extension: abnormal  Flexion: normal  Lateral bend right: abnormal  Lateral bend left: abnormal  Rotation right: abnormal  Rotation left: abnormal   Muscle Strength  Right Quadriceps:  5/5  Left Quadriceps:  5/5  Right Hamstrings:  5/5  Left Hamstrings:  5/5   Tests  Straight leg raise right: negative Straight leg raise left: negative  Reflexes  Patellar: normal Achilles: normal Biceps: normal Babinski's sign: normal       Specialty Comments:  No specialty comments available.  Imaging: No results found.   PMFS History: Patient Active Problem List   Diagnosis Date Noted  . Sciatica of right side 06/11/2017  . GERD (gastroesophageal reflux disease) 11/26/2016  . Right Achilles tendinitis 08/15/2016  . Hypertension 07/05/2016  . Adhesive capsulitis of right shoulder 02/21/2016  . Heel pain, bilateral 01/10/2016  . 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 History of colonic polyps   . Diverticulosis of colon without hemorrhage   . Hypothyroidism  08/10/2015  . Chronic constipation 08/05/2015  . Hyperlipidemia 08/05/2015   Past Medical History:  Diagnosis Date  . Asthma   . Constipation   . Hyperlipemia   . Thyroid disease     Family History  Problem Relation Age of Onset  . Diabetes Father   . Hypertension Father   . Stroke Maternal Grandmother   . Stroke Maternal Grandfather   . Colon cancer Neg Hx     Past Surgical History:  Procedure Laterality Date  . CATARACT EXTRACTION    . COLONOSCOPY N/A 10/10/2015   Procedure: COLONOSCOPY;  Surgeon: Daneil Dolin, MD;  [facility];  Service: Endoscopy;  Laterality: N/A;  245 - Interpreter scheduled, do NOT move   Social History   Occupational History  . Not on file  Tobacco Use  . Smoking status: Never Smoker  . Smokeless tobacco: Never Used  Substance and Sexual Activity  .  Alcohol use: No    Alcohol/week: 0.0 standard drinks  . Drug use: No  . Sexual activity: Not Currently

## 2018-02-10 NOTE — Progress Notes (Signed)
Assessment & Plan:  Alexandra Brewer was seen today for hand pain.  Diagnoses and all orders for this visit:  Ganglion cyst She was given a right hand split to avoid direct pressure on the cyst. Plan would be for the cyst to resolve on its own. If not will need to follow up with ortho for possible aspiration.   Patient has been counseled on age-appropriate routine health concerns for screening and prevention. These are reviewed and up-to-date. Referrals have been placed accordingly. Immunizations are up-to-date or declined.    Subjective:   Chief Complaint  Patient presents with  . Hand Pain    Pt. stated her right hand started to hurt 2 weeks ago, and think it may be nerve pain.    HPI Alexandra Brewer 62 y.o. female presents to office today with complaints of right hand pain  Right Hand Pain Onset a  few weeks ago. Her work involves housekeeping and cleaning. Reports she began to feel a small tender "lump" in the middle of her right hand. Sometimes it will increase in size after she has been working all day. Aggravating factors: Direct pressure. She has not applied any topical agents or taken any medications OTC. She denies any trauma or injury to her hand.   Review of Systems  Constitutional: Negative for fever, malaise/fatigue and weight loss.  HENT: Negative.  Negative for nosebleeds.   Eyes: Negative.  Negative for blurred vision, double vision and photophobia.  Respiratory: Negative.  Negative for cough and shortness of breath.   Cardiovascular: Negative.  Negative for chest pain, palpitations and leg swelling.  Gastrointestinal: Negative.  Negative for heartburn, nausea and vomiting.  Musculoskeletal: Negative for myalgias.       SEE HPI  Neurological: Negative.  Negative for dizziness, focal weakness, seizures and headaches.  Psychiatric/Behavioral: Negative.  Negative for suicidal ideas.    Past Medical History:  Diagnosis Date  . Asthma   . Constipation   . Hyperlipemia     . Thyroid disease     Past Surgical History:  Procedure Laterality Date  . CATARACT EXTRACTION    . COLONOSCOPY N/A 10/10/2015   Procedure: COLONOSCOPY;  Surgeon: Daneil Dolin, MD;  [facility];  Service: Endoscopy;  Laterality: N/A;  245 - Interpreter scheduled, do NOT move    Family History  Problem Relation Age of Onset  . Diabetes Father   . Hypertension Father   . Stroke Maternal Grandmother   . Stroke Maternal Grandfather   . Colon cancer Neg Hx     Social History Reviewed with no changes to be made today.   Outpatient Medications Prior to Visit  Medication Sig Dispense Refill  . amLODipine (NORVASC) 5 MG tablet Take 1 tablet (5 mg total) by mouth daily. 30 tablet 6  . cetirizine (ZYRTEC) 10 MG tablet Take 1 tablet (10 mg total) by mouth daily. Prn itching 30 tablet 2  . levothyroxine (SYNTHROID, LEVOTHROID) 112 MCG tablet Take 1 tablet (112 mcg total) by mouth daily. 30 tablet 2  . meloxicam (MOBIC) 15 MG tablet Take 1 tablet (15 mg total) by mouth daily. 30 tablet 3  . atorvastatin (LIPITOR) 20 MG tablet Take 1 tablet (20 mg total) by mouth daily. (Patient not taking: Reported on 02/10/2018) 30 tablet 6  . diclofenac sodium (VOLTAREN) 1 % GEL Apply 2 g topically 4 (four) times daily. (Patient not taking: Reported on 02/10/2018) 3 Tube 3  . Elastic Bandages & Supports (MEDICAL COMPRESSION STOCKINGS) MISC 25-30  mm Hg (Patient not taking: Reported on 04/26/2017) 2 each 0  . gabapentin (NEURONTIN) 300 MG capsule Take 1 capsule (300 mg total) by mouth at bedtime for 7 days, THEN 1 capsule (300 mg total) 2 (two) times daily. (Patient not taking: Reported on 02/10/2018) 67 capsule 0  . meloxicam (MOBIC) 15 MG tablet Take 1 tablet (15 mg total) by mouth daily. (Patient not taking: Reported on 02/10/2018) 30 tablet 6  . methocarbamol (ROBAXIN) 500 MG tablet Take 1 tablet (500 mg total) by mouth every 8 (eight) hours as needed for muscle spasms. (Patient not taking:  Reported on 02/10/2018) 90 tablet 0  . omeprazole (PRILOSEC) 20 MG capsule Take 1 capsule (20 mg total) by mouth daily. (Patient not taking: Reported on 02/10/2018) 90 capsule 3  . solifenacin (VESICARE) 5 MG tablet Take 1 tablet (5 mg total) by mouth daily. (Patient not taking: Reported on 02/10/2018) 30 tablet 1  . triamcinolone cream (KENALOG) 0.1 % Apply 1 application topically 2 (two) times daily. (Patient not taking: Reported on 02/10/2018) 45 g 0  . Wheat Dextrin (BENEFIBER) POWD Take by mouth 2 (two) times daily. Takes 2 tbsp twice daily     No facility-administered medications prior to visit.     Allergies  Allergen Reactions  . Fish Allergy     Throat swell up  . Penicillins     Has patient had a PCN reaction causing immediate rash, facial/tongue/throat swelling, SOB or lightheadedness with hypotension:  Has patient had a PCN reaction causing severe rash involving mucus membranes or skin necrosis: unknown Has patient had a PCN reaction that required hospitalization unknown Has patient had a PCN reaction occurring within the last 10 years: unknown If all of the above answers are "NO", then may proceed with C  . Shellfish Allergy     Throat swell up       Objective:    BP 127/80 (BP Location: Left Arm, Patient Position: Sitting, Cuff Size: Normal)   Pulse 63   Temp 98.4 F (36.9 C) (Oral)   Ht 5' (1.524 m)   Wt 168 lb (76.2 kg)   SpO2 98%   BMI 32.81 kg/m  Wt Readings from Last 3 Encounters:  02/10/18 168 lb (76.2 kg)  02/10/18 168 lb (76.2 kg)  12/16/17 163 lb 12.8 oz (74.3 kg)    Physical Exam  Constitutional: She is oriented to person, place, and time. She appears well-developed and well-nourished. She is cooperative.  HENT:  Head: Normocephalic and atraumatic.  Eyes: EOM are normal.  Neck: Normal range of motion.  Cardiovascular: Normal rate, regular rhythm, normal heart sounds and intact distal pulses. Exam reveals no gallop and no friction rub.  No murmur  heard. Pulmonary/Chest: Effort normal and breath sounds normal. No tachypnea. No respiratory distress. She has no decreased breath sounds. She has no wheezes. She has no rhonchi. She has no rales. She exhibits no tenderness.  Abdominal: Soft. Bowel sounds are normal.  Musculoskeletal: Normal range of motion. She exhibits tenderness. She exhibits no edema.       Right hand: She exhibits tenderness and deformity. Normal sensation noted. Normal strength noted.       Hands: Neurological: She is alert and oriented to person, place, and time. Coordination normal.  Skin: Skin is warm and dry.  Psychiatric: She has a normal mood and affect. Her behavior is normal. Judgment and thought content normal.  Nursing note and vitals reviewed.       Patient has been  counseled extensively about nutrition and exercise as well as the importance of adherence with medications and regular follow-up. The patient was given clear instructions to go to ER or return to medical center if symptoms don't improve, worsen or new problems develop. The patient verbalized understanding.   Follow-up: Return in about 4 weeks (around 03/10/2018) for Ganglion cyst.   Gildardo Pounds, FNP-BC [facility] and [facility] [place], [place]   02/10/2018, 5:04 PM

## 2018-02-10 NOTE — Patient Instructions (Signed)
Avoid bending, stooping and avoid lifting weights greater than 10 lbs. Avoid prolong standing and walking. Avoid frequent bending and stooping  No lifting greater than 10 lbs. May use ice or moist heat for pain. Weight loss is of benefit. Handicap license is approved. Right heel cord stretching and apply voltaren gel to the heel 3-4 times per day 2-4 grams. Take Meloxicam for antiinflammation. Go to Physical Therapy For exercises for the lumbar spine and right heel. Obtain a tennis shoe that is well padded in the heel and has an adequate arch support. NuBalance, Easy Spirits  Are helpful or Reebocks.

## 2018-02-10 NOTE — Patient Instructions (Signed)
Quiste ganglionar (Ganglion Cyst) Un quiste ganglionar es un bulto no canceroso lleno de lquido que se forma cerca de las articulaciones o los tendones. El quiste ganglionar crece fuera de una articulacin o de la membrana de un tendn. [employer] de las veces aparece en la mano o la [place], pero tambin puede formarse en el hombro, el codo, la cadera, la rodilla, el tobillo o el pie. El quiste ganglionar redondo u ovalado puede tener el tamao de un guisante o ser ms grande que [place]. El incremento de la actividad puede aumentar su tamao porque empieza a acumularse ms lquido. CAUSAS Se desconoce qu causa el crecimiento de un quiste ganglionar. Sin embargo, puede guardar relacin con:  Inflamacin o irritacin alrededor de la articulacin.  Una lesin.  Los movimientos repetitivos o el uso excesivo.  Artritis. FACTORES DE RIESGO Entre los factores de riesgo se incluyen los siguientes:  Ser mujer.  Tener entre 56 y 15aos. [place] los sntomas se pueden incluir los siguientes:  Un bulto. Este aparece con ms frecuencia en la mano o la [place], pero tambin puede presentarse en otras zonas del cuerpo.  Hormigueo.  Dolor.  Entumecimiento.  Debilidad muscular.  Sujecin dbil.  Prdida del movimiento de Insurance claims handler. DIAGNSTICO La mayora de las veces los quistes ganglionares se diagnostican en funcin de un examen fsico. El mdico palpar el bulto y puede iluminarlo para ver si la luz pasa a travs de [place]. Si es un quiste ganglionar, la luz suele traspasarlo. El mdico puede indicarle una radiografa, una ecografa o una resonancia magntica para descartar otras afecciones. TRATAMIENTO Generalmente, los quistes ganglionares desaparecen solos sin tratamiento. Si hay dolor u otros sntomas, tal vez se necesite tratamiento. Tambin es necesario un tratamiento si el quiste ganglionar limita sus movimientos o si se infecta. El tratamiento puede incluir lo  siguiente:  Usar una frula o una tablilla en la mueca o el dedo.  Medicamentos antiinflamatorios.  Extraer lquido del bulto con Ardelia Mems aguja (aspiracin).  Inyectar un corticoide en la articulacin.  Ciruga para extirpar Web designer. INSTRUCCIONES PARA EL CUIDADO EN EL HOGAR  No haga presin Secretary/administrator, no lo pinche con una aguja ni lo golpee.  Tome los medicamentos solamente como se lo haya indicado el mdico.  Use la frula o la tablilla como se lo haya indicado el mdico.  Controle el quiste ganglionar para Actuary cambio.  Concurra a todas las visitas de control como se lo haya indicado el mdico. Esto es importante. SOLICITE ATENCIN MDICA SI:  El quiste ganglionar se agranda o le provoca ms dolor.  Aumenta el enrojecimiento, las lneas rojas o la hinchazn.  Observa que sale pus del bulto.  Siente debilidad o adormecimiento alrededor de la zona afectada.  Tiene fiebre o siente escalofros. Esta informacin no tiene Marine scientist el consejo del mdico. Asegrese de hacerle al mdico cualquier pregunta que tenga. Document Released: 12/27/2004 Document Revised: 04/09/2014 Document Reviewed: 09/01/2013 Elsevier Interactive Patient Education  [employer].

## 2018-02-24 ENCOUNTER — Ambulatory Visit: Payer: Self-pay | Attending: Specialist

## 2018-02-24 ENCOUNTER — Other Ambulatory Visit: Payer: Self-pay

## 2018-02-24 DIAGNOSIS — R293 Abnormal posture: Secondary | ICD-10-CM | POA: Insufficient documentation

## 2018-02-24 DIAGNOSIS — G8929 Other chronic pain: Secondary | ICD-10-CM | POA: Insufficient documentation

## 2018-02-24 DIAGNOSIS — M4316 Spondylolisthesis, lumbar region: Secondary | ICD-10-CM | POA: Insufficient documentation

## 2018-02-24 DIAGNOSIS — M545 Low back pain, unspecified: Secondary | ICD-10-CM

## 2018-02-24 MED FILL — DICLOFENAC SODIUM 1% GEL: 1 | 12 days supply | Qty: 100 | Fill #0

## 2018-02-24 MED FILL — GABAPENTIN 300 MG CAPSULE: 300 | 37 days supply | Qty: 67 | Fill #0

## 2018-02-24 NOTE — Therapy (Signed)
[place], [place], [address] Phone: [phone]   Fax:  [phone]  Physical Therapy Evaluation  Patient Details  Name: Alexandra Brewer MRN: [id number] Date of Birth: 02/03/1956 Referring Provider (PT): Basil Dess, MD   Encounter Date: 02/24/2018  PT End of Session - 02/24/18 1215    Visit Number  1    Number of Visits  12    Date for PT Re-Evaluation  04/04/18    Authorization Type  CAFA    PT Start Time  1230    PT Stop Time  1320    PT Time Calculation (min)  50 min    Activity Tolerance  Patient tolerated treatment well    Behavior During Therapy  [facility] for tasks assessed/performed       Past Medical History:  Diagnosis Date  . Asthma   . Constipation   . Hyperlipemia   . Thyroid disease     Past Surgical History:  Procedure Laterality Date  . CATARACT EXTRACTION    . COLONOSCOPY N/A 10/10/2015   Procedure: COLONOSCOPY;  Surgeon: Daneil Dolin, MD;  [facility];  Service: Endoscopy;  Laterality: N/A;  245 - Interpreter scheduled, do NOT move    There were no vitals filed for this visit.   Subjective Assessment - 02/24/18 1240    Subjective   She reports pain RT lateral leg to foot. Started lateral RT leg 4 years ago.   Worse in AM . She reports pain is getting worse unless she takes medication then pain decreases  then returns later.       Dr Louanne Skye said i had sciatica     Patient is accompained by:  Interpreter    Pertinent History  She reports many falls but does not relate onset to leg symptoms.     Limitations  Sitting   getting out of chair.    How long can you sit comfortably?  60    How long can you stand comfortably?  4 hours    How long can you walk comfortably?  30 min    Diagnostic tests  xray           [facility] PT Assessment - 02/24/18 0001      Assessment   Medical Diagnosis  lumbar spondylolisthesis, spondylosis, sciatica    Referring Provider (PT)   Basil Dess, MD    Onset Date/Surgical Date  --   4 years ago   Next MD Visit  end of December    Prior Therapy  No      Precautions   Precautions  None      Restrictions   Weight Bearing Restrictions  No      Balance Screen   Has the patient fallen in the past 6 months  No      Prior Function   Level of Independence  Independent      Cognition   Overall Cognitive Status  Within Functional Limits for tasks assessed      Observation/Other Assessments   Focus on Therapeutic Outcomes (FOTO)   48% limited      Posture/Postural Control   Posture Comments  incr lordosis      ROM / Strength   AROM / PROM / Strength  AROM;Strength      AROM   AROM Assessment Site  Lumbar    Lumbar Flexion  80    Lumbar Extension  20    Lumbar - Right Side  Bend  15    Lumbar - Left Side Bend  15    Lumbar - Right Rotation  60    Lumbar - Left Rotation  80      Strength   Overall Strength Comments  LE WNL       Flexibility   Soft Tissue Assessment /Muscle Length  yes    Hamstrings  90 degrees bilateral SLR but posterior  RT thigh pain at end range only on RT    Quadriceps  Normal even in prone      Palpation   SI assessment   RT ASIS more distal in supine   LT sacrum more posterior than RT.     Palpation comment  Tender bilateral paraspinals                 Objective measurements completed on examination: See above findings.              PT Education - 02/24/18 1215    Education Details  POC    Person(s) Educated  Patient    Methods  Explanation    Comprehension  Verbalized understanding       PT Short Term Goals - 02/24/18 1311      PT SHORT TERM GOAL #1   Title  She will be indpendent with initial hEP     Time  2    Period  Weeks    Status  New      PT SHORT TERM GOAL #2   Title  She will report pain decr 20% generally with activity    Time  3    Period  Weeks    Status  New        PT Long Term Goals - 02/24/18 1311      PT LONG TERM GOAL  #1   Title  She will be independent with all HEP issued    Time  6    Period  Weeks    Status  New      PT LONG TERM GOAL #2   Title  She will report pain decr 50%    Time  6    Period  Weeks      PT LONG TERM GOAL #3   Title  she will report pain decr 30% with work tasks    Time  6    Period  Weeks    Status  New      PT LONG TERM GOAL #4   Title  She will report AM pain decr 50%    Time  6    Period  Weeks    Status  New             Plan - 02/24/18 1216    Clinical Impression Statement  Ms REye-Garcia presents with chronic reports of back and RT leg pain that is worse with sitting and  walking and doing work as Electrical engineer.    She has incr lordosis and asymitry in pelvis and sacrum.    Work tasks will make progress more difficult.   She should do some better with a HEP        History and Personal Factors relevant to plan of care:  Spondylosis,   spondylolisthesis,      Clinical Presentation due to:  chronic RT leg pain    Clinical Decision Making  Moderate    Rehab Potential  Good    Clinical Impairments Affecting Rehab Potential  chronicity and  work tasks    PT Frequency  2x / week    PT Duration  6 weeks    PT Treatment/Interventions  Moist Heat;Electrical Stimulation;Iontophoresis 4mg /ml Dexamethasone;Traction;Therapeutic exercise;Patient/family education;Manual techniques    PT Next Visit Plan  REveiw and add to HEP , Manual and modalities for paoin.     PT Home Exercise Plan  RT knee to chest    Consulted and Agree with Plan of Care  Patient       Patient will benefit from skilled therapeutic intervention in order to improve the following deficits and impairments:  Pain, Postural dysfunction, Increased muscle spasms, Decreased activity tolerance, Decreased strength, Difficulty walking  Visit Diagnosis: Chronic low back pain without sciatica, unspecified back pain laterality - Plan: PT plan of care cert/re-cert  Abnormal posture - Plan: PT plan of care  cert/re-cert  Spondylolisthesis of lumbar region - Plan: PT plan of care cert/re-cert     Problem List Patient Active Problem List   Diagnosis Date Noted  . Sciatica of right side 06/11/2017  . GERD (gastroesophageal reflux disease) 11/26/2016  . Right Achilles tendinitis 08/15/2016  . Hypertension 07/05/2016  . Adhesive capsulitis of right shoulder 02/21/2016  . Heel pain, bilateral 01/10/2016  . 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 History of colonic polyps   . Diverticulosis of colon without hemorrhage   . Hypothyroidism 08/10/2015  . Chronic constipation 08/05/2015  . Hyperlipidemia 08/05/2015    Darrel Hoover PT 02/24/2018, 1:46 PM  [facility] [address] [place], [place], [address] Phone: [phone]   Fax:  [phone]  Name: Naw Lasala MRN: [id number] Date of Birth: 05/02/1955

## 2018-03-10 ENCOUNTER — Ambulatory Visit: Payer: Self-pay | Admitting: Internal Medicine

## 2018-03-10 ENCOUNTER — Ambulatory Visit: Payer: Self-pay | Attending: Specialist | Admitting: Physical Therapy

## 2018-03-10 ENCOUNTER — Encounter: Payer: Self-pay | Admitting: Nurse Practitioner

## 2018-03-10 ENCOUNTER — Ambulatory Visit (INDEPENDENT_AMBULATORY_CARE_PROVIDER_SITE_OTHER): Payer: Self-pay | Admitting: Nurse Practitioner

## 2018-03-10 VITALS — BP 153/72 | HR 66 | Temp 97.2°F | Ht 60.0 in | Wt 162.6 lb

## 2018-03-10 DIAGNOSIS — M4316 Spondylolisthesis, lumbar region: Secondary | ICD-10-CM | POA: Insufficient documentation

## 2018-03-10 DIAGNOSIS — K5909 Other constipation: Secondary | ICD-10-CM

## 2018-03-10 DIAGNOSIS — G8929 Other chronic pain: Secondary | ICD-10-CM | POA: Insufficient documentation

## 2018-03-10 DIAGNOSIS — R109 Unspecified abdominal pain: Secondary | ICD-10-CM | POA: Insufficient documentation

## 2018-03-10 DIAGNOSIS — K219 Gastro-esophageal reflux disease without esophagitis: Secondary | ICD-10-CM

## 2018-03-10 DIAGNOSIS — R1084 Generalized abdominal pain: Secondary | ICD-10-CM

## 2018-03-10 DIAGNOSIS — R293 Abnormal posture: Secondary | ICD-10-CM | POA: Insufficient documentation

## 2018-03-10 DIAGNOSIS — M545 Low back pain: Secondary | ICD-10-CM | POA: Insufficient documentation

## 2018-03-10 MED ORDER — LINACLOTIDE 290 MCG PO CAPS: 290.0000 ug | ORAL_CAPSULE | Freq: Every day | ORAL | 3 refills | Status: DC

## 2018-03-10 MED ORDER — OMEPRAZOLE 20 MG PO CPDR: 20.0000 mg | DELAYED_RELEASE_CAPSULE | Freq: Every day | ORAL | 3 refills | Status: DC

## 2018-03-10 MED FILL — !LINZESS 290 MCG CAPSULE: 290 | 30 days supply | Qty: 30 | Fill #0

## 2018-03-10 NOTE — Progress Notes (Signed)
Referring Provider: Orlando Penner* Primary Care Physician:  Ladell Pier, MD Primary GI:  Dr. Gala Romney  Chief Complaint  Patient presents with  . Abdominal Pain    has not had any pain x 3 months  . Constipation    BM once a day, some days she strains. No blood    HPI:   Alexandra Brewer is a 62 y.o. female who presents for follow-up on abdominal pain.  The patient was last seen in our office 01/23/2017 for the same as well as chronic constipation and right upper quadrant pain.  Previous visit assisted by a Optometrist.  Colonoscopy up-to-date 10/2015 and recommended repeat in 2027.  She was doing much better, stopped Linzess after 2 weeks due to diarrhea.  Was having a bowel movement once daily at that time, noted complete emptying.  No abdominal pain unless she eats specific triggers.  GERD pain also resolved unless she eats specific triggers.  Some far right-sided abdominal discomfort which is intermittent, but states it is more "annoying and nagging but not really painful.  No other GI symptoms.  Recommended follow-up ultrasound, continue medications, follow-up in 3 months.  Right upper quadrant ultrasound completed 02/04/2017 which found an essentially normal study.  She was a no-show for follow-ups.  Today she is accompanied by a Publishing copy. About 2 months ago she had significant abdominal pain which resolved with Maalox. No pain in about 3 months. Having daily bowel movements, occasionally feels constipated; was given Linzess 290 mcg previously which occasionally caused non-concerning diarrhea, but feels it helped her a lot and would like an Rx. GERD symptoms are doing so-so, Omeprazole helped but doesn't have anymore. Denies any other abdominal pain, N/V, fever, unintentional weight loss. Denies chest pain, dyspnea, dizziness, lightheadedness, syncope, near syncope. Denies any other upper or lower GI symptoms.  **NOTE: a system translator reviewed the patient  instructions translation and agreed to their accuracy**  Past Medical History:  Diagnosis Date  . Asthma   . Constipation   . Hyperlipemia   . Sciatica   . Thyroid disease     Past Surgical History:  Procedure Laterality Date  . CATARACT EXTRACTION    . COLONOSCOPY N/A 10/10/2015   Procedure: COLONOSCOPY;  Surgeon: Daneil Dolin, MD;  [facility];  Service: Endoscopy;  Laterality: N/A;  245 - Interpreter scheduled, do NOT move    Current Outpatient Medications  Medication Sig Dispense Refill  . amLODipine (NORVASC) 5 MG tablet Take 1 tablet (5 mg total) by mouth daily. 30 tablet 6  . cetirizine (ZYRTEC) 10 MG tablet Take 1 tablet (10 mg total) by mouth daily. Prn itching 30 tablet 2  . gabapentin (NEURONTIN) 300 MG capsule Take 1 capsule (300 mg total) by mouth at bedtime for 7 days, THEN 1 capsule (300 mg total) 2 (two) times daily. 67 capsule 0  . levothyroxine (SYNTHROID, LEVOTHROID) 112 MCG tablet Take 1 tablet (112 mcg total) by mouth daily. 30 tablet 2  . meloxicam (MOBIC) 15 MG tablet Take 1 tablet (15 mg total) by mouth daily. 30 tablet 3  . Wheat Dextrin (BENEFIBER) POWD Take by mouth 2 (two) times daily. Takes 2 tbsp twice daily    . omeprazole (PRILOSEC) 20 MG capsule Take 1 capsule (20 mg total) by mouth daily. (Patient not taking: Reported on 02/10/2018) 90 capsule 3   No current facility-administered medications for this visit.     Allergies as of 03/10/2018 - Review Complete 03/10/2018  Allergen Reaction Noted  . Fish allergy  06/11/2017  . Penicillins  07/20/2015  . Shellfish allergy  06/11/2017    Family History  Problem Relation Age of Onset  . Diabetes Father   . Hypertension Father   . Stroke Maternal Grandmother   . Stroke Maternal Grandfather   . Colon cancer Neg Hx     Social History   Socioeconomic History  . Marital status: Single    Spouse name: Not on file  . Number of children: Not on file  . Years of education: Not on file    . Highest education level: Not on file  Occupational History  . Not on file  Social Needs  . Financial resource strain: Not on file  . Food insecurity:    Worry: Not on file    Inability: Not on file  . Transportation needs:    Medical: Not on file    Non-medical: Not on file  Tobacco Use  . Smoking status: Never Smoker  . Smokeless tobacco: Never Used  Substance and Sexual Activity  . Alcohol use: No    Alcohol/week: 0.0 standard drinks  . Drug use: No  . Sexual activity: Not Currently  Lifestyle  . Physical activity:    Days per week: Not on file    Minutes per session: Not on file  . Stress: Not on file  Relationships  . Social connections:    Talks on phone: Not on file    Gets together: Not on file    Attends religious service: Not on file    Active member of club or organization: Not on file    Attends meetings of clubs or organizations: Not on file    Relationship status: Not on file  Other Topics Concern  . Not on file  Social History Narrative  . Not on file    Review of Systems: General: Negative for anorexia, weight loss, fever, chills, fatigue, weakness. ENT: Negative for hoarseness, nasal congestion. CV: Negative for chest pain, angina, palpitations, peripheral edema.  Respiratory: Negative for dyspnea at rest, dyspnea on exertion, cough, sputum, wheezing.  GI: See history of present illness. Endo: Negative for unusual weight change.  Heme: Negative for bruising or bleeding.   Physical Exam: BP (!) 153/72   Pulse 66   Temp (!) 97.2 F (36.2 C) (Oral)   Ht 5' (1.524 m)   Wt 162 lb 9.6 oz (73.8 kg)   BMI 31.76 kg/m  General:   Alert and oriented. Pleasant and cooperative. Well-nourished and well-developed.  Eyes:  Without icterus, sclera clear and conjunctiva pink.  Ears:  Normal auditory acuity. Cardiovascular:  S1, S2 present without murmurs appreciated. Extremities without clubbing or edema. Respiratory:  Clear to auscultation bilaterally.  No wheezes, rales, or rhonchi. No distress.  Gastrointestinal:  +BS, soft, non-tender and non-distended. No HSM noted. No guarding or rebound. No masses appreciated.  Rectal:  Deferred  Musculoskalatal:  Symmetrical without gross deformities. Normal posture. Skin:  Intact without significant lesions or rashes. Neurologic:  Alert and oriented x4;  grossly normal neurologically. Psych:  Alert and cooperative. Normal mood and affect. Heme/Lymph/Immune: No excessive bruising noted.    03/10/2018 9:52 AM   Disclaimer: This note was dictated with voice recognition software. Similar sounding words can inadvertently be transcribed and may not be corrected upon review.

## 2018-03-10 NOTE — Assessment & Plan Note (Signed)
GERD symptoms generally well managed except when she eats a trigger food, which she generally types to avoid.  At this point she is out of Prilosec and we will refill this for her.  Follow-up in 1 year.

## 2018-03-10 NOTE — Patient Instructions (Signed)
1. I have sent prescriptions for Prilosec and Linzess to your pharmacy. 2. Return for follow-up in 1 year. 3. Call us if you have any questions or concerns.  Espanol: 1. He enviado recetas para Prilosec y Linzess a su farmacia. 2. Regreso para seguimiento en 1 ao. 3. Llmenos si tiene alguna pregunta o inquietud.  At [facility] we value your feedback. You may receive a survey about your visit today. Please share your experience as we strive to create trusting relationships with our patients to provide genuine, compassionate, quality care.  We appreciate your understanding and patience as we review any laboratory studies, imaging, and other diagnostic tests that are ordered as we care for you. Our office policy is 5 business days for review of these results, and any emergent or urgent results are addressed in a timely manner for your best interest. If you do not hear from our office in 1 week, please contact us.   We also encourage the use of MyChart, which contains your medical information for your review as well. If you are not enrolled in this feature, an access code is on this after visit summary for your convenience. Thank you for allowing [place] to be involved in your care.  It was great to see you today!  I hope you have a Merry Christmas!!    En [facility] valoramos sus comentarios. Puede recibir [facility] su visita hoy. Comparta su experiencia mientras nos esforzamos por crear relaciones de confianza con nuestros pacientes para brindar atencin [place], compasiva y de calidad.  Agradecemos su comprensin y [employer] estudios de laboratorio, las imgenes y Scientist, research (medical) pruebas de diagnstico que se solicitan mientras nos ocupamos de usted. La poltica de [place] oficina es de 5 das hbiles para la revisin de [place], y cualquier resultado emergente o urgente se aborda de manera oportuna para su mejor inters. Si no tiene  noticias de [place] oficina en 1 semana, contctenos.  Tambin alentamos el uso de MyChart, que tambin contiene su informacin mdica para su revisin. Si no est inscrito en esta funcin, encontrar un cdigo de acceso despus del resumen de la visita para su conveniencia. Gracias por permitirnos participar en su atencin.  Fue genial verte hoy! Espero que tengas una feliz navidad !!

## 2018-03-10 NOTE — Assessment & Plan Note (Signed)
The patient has a history of intermittent abdominal pain in the setting of GERD and constipation.  She had significant flare of abdominal pain 3 months ago.  This resolved with Maalox.  No further abdominal pain since then.  At this point I will refill her medications.  Follow-up in 1 year.

## 2018-03-10 NOTE — Progress Notes (Signed)
CC'D TO PCP °

## 2018-03-10 NOTE — Assessment & Plan Note (Signed)
Chronic constipation and previously did much better on Linzess 290 mcg.  She is requesting a prescription to be sent to her pharmacy.  We will do this.  Follow-up in 1 year.

## 2018-03-13 ENCOUNTER — Ambulatory Visit: Payer: Self-pay | Attending: Family Medicine | Admitting: Physician Assistant

## 2018-03-13 VITALS — BP 144/83 | HR 61 | Temp 98.2°F | Resp 18 | Ht 60.0 in | Wt 165.0 lb

## 2018-03-13 DIAGNOSIS — Z79890 Hormone replacement therapy (postmenopausal): Secondary | ICD-10-CM | POA: Insufficient documentation

## 2018-03-13 DIAGNOSIS — Z79899 Other long term (current) drug therapy: Secondary | ICD-10-CM | POA: Insufficient documentation

## 2018-03-13 DIAGNOSIS — E079 Disorder of thyroid, unspecified: Secondary | ICD-10-CM | POA: Insufficient documentation

## 2018-03-13 DIAGNOSIS — J45909 Unspecified asthma, uncomplicated: Secondary | ICD-10-CM | POA: Insufficient documentation

## 2018-03-13 DIAGNOSIS — M67441 Ganglion, right hand: Secondary | ICD-10-CM | POA: Insufficient documentation

## 2018-03-13 DIAGNOSIS — E785 Hyperlipidemia, unspecified: Secondary | ICD-10-CM | POA: Insufficient documentation

## 2018-03-13 DIAGNOSIS — M79641 Pain in right hand: Secondary | ICD-10-CM | POA: Insufficient documentation

## 2018-03-13 MED ORDER — TRIAMCINOLONE ACETONIDE 0.1 % EX CREA: 1.0000 "application " | TOPICAL_CREAM | Freq: Two times a day (BID) | CUTANEOUS | 2 refills | Status: DC

## 2018-03-13 MED FILL — OMEPRAZOLE 20 MG CAP: 20 | 30 days supply | Qty: 30 | Fill #0

## 2018-03-13 NOTE — Progress Notes (Signed)
Patient ID: Alexandra Brewer, female   DOB: [birth year], 62 y.o.   MRN: [id number]       Alexandra Brewer, is a 62 y.o. female  QBH:[id number]  OXB:[id number]  DOB - [birth year]  Subjective:  Chief Complaint and HPI: Alexandra Brewer is a 62 y.o. female here still having trouble with cyst on R hand.  Worse with working.  [employer].  [employer] interpreters "Antonio" translating   ROS:   Constitutional:  No f/c, No night sweats, No unexplained weight loss. EENT:  No vision changes, No blurry vision, No hearing changes. No mouth, throat, or ear problems.  Respiratory: No cough, No SOB Cardiac: No CP, no palpitations GI:  No abd pain, No N/V/D. GU: No Urinary s/sx Musculoskeletal: +R hand pain Neuro: No headache, no dizziness, no motor weakness.  Skin: No rash Endocrine:  No polydipsia. No polyuria.  Psych: Denies SI/HI  No problems updated.  ALLERGIES:  PAST MEDICAL HISTORY: Past Medical History:  Diagnosis Date  . Asthma   . Constipation   . Hyperlipemia   . Sciatica   . Thyroid disease     MEDICATIONS AT HOME: Prior to Admission medications   Medication Sig Start Date End Date Taking? Authorizing Provider  amLODipine (NORVASC) 5 MG tablet Take 1 tablet (5 mg total) by mouth daily. 06/11/17  Yes Ladell Pier, MD  cetirizine (ZYRTEC) 10 MG tablet Take 1 tablet (10 mg total) by mouth daily. Prn itching 08/21/17  Yes ,  M, PA-C  gabapentin (NEURONTIN) 300 MG capsule Take 1 capsule (300 mg total) by mouth at bedtime for 7 days, THEN 1 capsule (300 mg total) 2 (two) times daily. 02/10/18 03/19/18 Yes Jessy Oto, MD  levothyroxine (SYNTHROID, LEVOTHROID) 112 MCG tablet Take 1 tablet (112 mcg total) by mouth daily. 11/07/17  Yes Ladell Pier, MD  linaclotide [facility]) 290 MCG CAPS capsule Take 1 capsule (290 mcg total) by mouth daily before breakfast. 03/10/18  Yes Carlis Stable, NP  omeprazole (PRILOSEC) 20 MG capsule Take 1 capsule (20 mg total) by mouth  daily. 03/10/18  Yes Carlis Stable, NP  Wheat Dextrin (BENEFIBER) POWD Take by mouth 2 (two) times daily. Takes 2 tbsp twice daily   Yes [provider]  triamcinolone cream (KENALOG) 0.1 % Apply 1 application topically 2 (two) times daily. 03/13/18   Argentina Donovan, PA-C     Objective:  EXAM:   Vitals:   03/13/18 1637  BP: (!) 144/83  Pulse: 61  Resp: 18  Temp: 98.2 F (36.8 C)  TempSrc: Oral  SpO2: 100%  Weight: 165 lb (74.8 kg)  Height: 5' (1.524 m)    General appearance : A&OX3. NAD. Non-toxic-appearing HEENT: Atraumatic and Normocephalic.  PERRLA. EOM intact.  Neck: supple, no JVD. No cervical lymphadenopathy. No thyromegaly Chest/Lungs:  Breathing-non-labored, Good air entry bilaterally, breath sounds normal without rales, rhonchi, or wheezing  CVS: S1 S2 regular, no murmurs, gallops, rubs  Extremities: Bilateral Lower Ext shows no edema, both legs are warm to touch with = pulse throughout. R hand with +TTP and cystic lesion over distal flexor tendon of middle MCP Neurology:  CN II-XII grossly intact, Non focal.   Psych:  TP linear. J/I WNL. Normal speech. Appropriate eye contact and affect.  Skin:  No Rash  Data Review Lab Results  Component Value Date   HGBA1C 5.4 09/13/2017     Assessment & Plan   1. Ganglion cyst of tendon sheath of right hand - Ambulatory referral to  Hand Surgery Tylenol or advil prn pain    Patient have been counseled extensively about nutrition and exercise  Return if symptoms worsen or fail to improve.  The patient was given clear instructions to go to ER or return to medical center if symptoms don't improve, worsen or new problems develop. The patient verbalized understanding. The patient was told to call to get lab results if they haven't heard anything in the next week.     Freeman Caldron, PA-C [facility] [place], [place]   03/13/2018, 4:48 PM

## 2018-03-17 ENCOUNTER — Ambulatory Visit: Payer: Self-pay

## 2018-03-17 DIAGNOSIS — M4316 Spondylolisthesis, lumbar region: Secondary | ICD-10-CM

## 2018-03-17 DIAGNOSIS — M545 Low back pain, unspecified: Secondary | ICD-10-CM

## 2018-03-17 DIAGNOSIS — R293 Abnormal posture: Secondary | ICD-10-CM

## 2018-03-17 DIAGNOSIS — G8929 Other chronic pain: Secondary | ICD-10-CM

## 2018-03-17 NOTE — Therapy (Addendum)
[facility] [address] [place], [place], [address] Phone: [phone]   Fax:  [phone]  Physical Therapy Treatment/Discharge  Patient Details  Name: Alexandra Brewer MRN: [id number] Date of Birth: 11/22/1955 Referring Provider (PT): James Nitka, MD   Encounter Date: 03/17/2018  PT End of Session - 03/17/18 1213    Visit Number  2    Number of Visits  12    Date for PT Re-Evaluation  04/04/18    Authorization Type  CAFA    PT Start Time  1225    PT Stop Time  1308    PT Time Calculation (min)  43 min    Activity Tolerance  Patient tolerated treatment well    Behavior During Therapy  WFL for tasks assessed/performed       Past Medical History:  Diagnosis Date  . Asthma   . Constipation   . Hyperlipemia   . Sciatica   . Thyroid disease     Past Surgical History:  Procedure Laterality Date  . CATARACT EXTRACTION    . COLONOSCOPY N/A 10/10/2015   Procedure: COLONOSCOPY;  Surgeon: Robert M Rourk, MD;  [facility];  Service: Endoscopy;  Laterality: N/A;  245 - Interpreter scheduled, do NOT move    There were no vitals filed for this visit.  Subjective Assessment - 03/17/18 1218    Subjective  She reports pain continues RT hip and bac k pain into leg.   Meds help.    Does  exercise. MAybe bending at work caused incr pain.     Patient is accompained by:  Interpreter    Pain Score  5     Pain Location  Back    Pain Orientation  Right    Pain Type  Chronic pain    Pain Onset  More than a month ago    Pain Frequency  Constant                       [facility] Adult PT Treatment/Exercise - 03/17/18 0001      Self-Care   Self-Care  Other Self-Care Comments      Exercises   Exercises  Lumbar      Lumbar Exercises: Stretches   Active Hamstring Stretch  Right;1 rep;20 seconds    Single Knee to Chest Stretch  Right;Left;30 seconds    Lower Trunk Rotation  2 reps;20 seconds    Lower Trunk  Rotation Limitations  RT/LT    Figure 4 Stretch  2 reps;20 seconds    Other Lumbar Stretch Exercise  child's pose with lateral stretch RT/ LT      Lumbar Exercises: Supine   Pelvic Tilt  10 reps    Clam  --    Bent Knee Raise  --    Bridge  --      Modalities   Modalities  Moist Heat      Moist Heat Therapy   Number Minutes Moist Heat  12 Minutes    Moist Heat Location  Lumbar Spine      Manual Therapy   Manual Therapy  Joint mobilization;Soft tissue mobilization;Passive ROM;Muscle Energy Technique;Myofascial release    Joint Mobilization  PA Gr 2-3 lower thoracic to sacrum, sacral pressur eRT PA  with deep breathing  for alighnment     Soft tissue mobilization  lower back    Myofascial Release  gluteal RT with pressure and hip rotation    Muscle Energy Technique  pelvic   alignment for RT anterior ilia rotation forward             PT Education - 03/17/18 1315    Education Details  HEP,   management of heel lift wear time 3-4 hours in and out for 3-4 days  to assess more comfort with walking  or decr pain    Person(s) Educated  Patient    Methods  Explanation;Demonstration;Tactile cues;Verbal cues;Handout    Comprehension  Returned demonstration;Verbalized understanding       PT Short Term Goals - 02/24/18 1311      PT SHORT TERM GOAL #1   Title  She will be indpendent with initial hEP     Time  2    Period  Weeks    Status  New      PT SHORT TERM GOAL #2   Title  She will report pain decr 20% generally with activity    Time  3    Period  Weeks    Status  New        PT Long Term Goals - 02/24/18 1311      PT LONG TERM GOAL #1   Title  She will be independent with all HEP issued    Time  6    Period  Weeks    Status  New      PT LONG TERM GOAL #2   Title  She will report pain decr 50%    Time  6    Period  Weeks      PT LONG TERM GOAL #3   Title  she will report pain decr 30% with work tasks    Time  6    Period  Weeks    Status  New      PT  LONG TERM GOAL #4   Title  She will report AM pain decr 50%    Time  6    Period  Weeks    Status  New            Plan - 03/17/18 1214    Clinical Impression Statement  She reported her back felt good at end of session and agreed to stretching and trial of heel lift LT shoe.  Follow up with stab exercises next session. LAnguage barrier makes comunication  consume more time than normal in session    PT Treatment/Interventions  Moist Heat;Electrical Stimulation;Iontophoresis 110m/ml Dexamethasone;Traction;Therapeutic exercise;Patient/family education;Manual techniques    PT Next Visit Plan  review childs pose and  add lumba stab exer    PT Home Exercise Plan  RT knee to chest, chillds pose adn side stretching    Consulted and Agree with Plan of Care  Patient       Patient will benefit from skilled therapeutic intervention in order to improve the following deficits and impairments:  Pain, Postural dysfunction, Increased muscle spasms, Decreased activity tolerance, Decreased strength, Difficulty walking  Visit Diagnosis: Chronic low back pain without sciatica, unspecified back pain laterality  Abnormal posture  Spondylolisthesis of lumbar region     Problem List Patient Active Problem List   Diagnosis Date Noted  . Abdominal pain 03/10/2018  . Sciatica of right side 06/11/2017  . GERD (gastroesophageal reflux disease) 11/26/2016  . Right Achilles tendinitis 08/15/2016  . Hypertension 07/05/2016  . Adhesive capsulitis of right shoulder 02/21/2016  . Heel pain, bilateral 01/10/2016  . 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 History of colonic polyps   .  Diverticulosis of colon without hemorrhage   . Hypothyroidism 08/10/2015  . Chronic constipation 08/05/2015  . Hyperlipidemia 08/05/2015    Alexandra Brewer  PT 03/17/2018, 1:21 PM  [facility] [address] [place], [place], [address] Phone:  [phone]   Fax:  [phone]  Name: Alexandra Brewer MRN: [id number] Date of Birth: 12-10-1955  PHYSICAL THERAPY DISCHARGE SUMMARY  Visits from Start of Care:  Current functional level related to goals / functional outcomes: She no shoed x 1 and did not return   Remaining deficits: Unknown   Education / Equipment: NA  Plan:                                                    Patient goals were not met. Patient is being discharged due to not returning since the last visit.  ?????    Pearson Forster PT  05/08/18

## 2018-03-17 NOTE — Patient Instructions (Signed)
Ardine Eng pose with RT and LT side bend stretching 2x/day 30-60 sec each

## 2018-03-24 ENCOUNTER — Ambulatory Visit: Payer: Self-pay

## 2018-03-24 ENCOUNTER — Ambulatory Visit (INDEPENDENT_AMBULATORY_CARE_PROVIDER_SITE_OTHER): Payer: Self-pay | Admitting: Orthopaedic Surgery

## 2018-03-24 DIAGNOSIS — M4316 Spondylolisthesis, lumbar region: Secondary | ICD-10-CM

## 2018-03-24 DIAGNOSIS — G8929 Other chronic pain: Secondary | ICD-10-CM

## 2018-03-24 DIAGNOSIS — M545 Low back pain, unspecified: Secondary | ICD-10-CM

## 2018-03-24 DIAGNOSIS — R293 Abnormal posture: Secondary | ICD-10-CM

## 2018-03-24 NOTE — Therapy (Signed)
[place], [place], [address] Phone: [phone]   Fax:  [phone]  Physical Therapy Treatment  Patient Details  Name: Alexandra Brewer MRN: [id number] Date of Birth: November 02, 1955 Referring Provider (PT): Basil Dess, MD   Encounter Date: 03/24/2018  PT End of Session - 03/24/18 1350    Visit Number  3    Number of Visits  12    Date for PT Re-Evaluation  04/04/18    Authorization Type  CAFA    PT Start Time  0150   late    PT Stop Time  0215    PT Time Calculation (min)  25 min    Activity Tolerance  Patient tolerated treatment well    Behavior During Therapy  [facility] for tasks assessed/performed       Past Medical History:  Diagnosis Date  . Asthma   . Constipation   . Hyperlipemia   . Sciatica   . Thyroid disease     Past Surgical History:  Procedure Laterality Date  . CATARACT EXTRACTION    . COLONOSCOPY N/A 10/10/2015   Procedure: COLONOSCOPY;  Surgeon: Daneil Dolin, MD;  [facility];  Service: Endoscopy;  Laterality: N/A;  245 - Interpreter scheduled, do NOT move    There were no vitals filed for this visit.  Subjective Assessment - 03/24/18 1351    Subjective  She has been doing better since last session.  She has used lift and feels  LT heel sore . She already has some heel pain.       Patient is accompained by:  Interpreter    Currently in Pain?  No/denies                       [facility] Adult PT Treatment/Exercise - 03/24/18 0001      Lumbar Exercises: Stretches   Active Hamstring Stretch  Right;2 reps;30 seconds    Single Knee to Chest Stretch  Right;Left;30 seconds    Lower Trunk Rotation  4 reps;20 seconds    Lower Trunk Rotation Limitations  RT/LT    Figure 4 Stretch  2 reps;20 seconds      Lumbar Exercises: Supine   Pelvic Tilt  10 reps    Clam  10 reps    Clam Limitations  green band     Other Supine Lumbar Exercises  ball squeeze x 10  with PPT . Stopped with hamstring cramp      Lumbar Exercises: Sidelying   Hip Abduction  Right;Left;10 reps      Manual Therapy   Joint Mobilization  PA Gr 2-3 lower thoracic to sacrum, sacral pressur eRT PA  with deep breathing  for alighnment                PT Short Term Goals - 03/24/18 1415      PT SHORT TERM GOAL #1   Title  She will be indpendent with initial hEP     Baseline  she ius able to demo HEP but will see after next session    Status  Partially Met      PT SHORT TERM GOAL #2   Title  She will report pain decr 20% generally with activity    Status  Achieved        PT Long Term Goals - 02/24/18 1311      PT LONG TERM GOAL #1   Title  She will be independent with  all HEP issued    Time  6    Period  Weeks    Status  New      PT LONG TERM GOAL #2   Title  She will report pain decr 50%    Time  6    Period  Weeks      PT LONG TERM GOAL #3   Title  she will report pain decr 30% with work tasks    Time  6    Period  Weeks    Status  New      PT LONG TERM GOAL #4   Title  She will report AM pain decr 50%    Time  6    Period  Weeks    Status  New              Patient will benefit from skilled therapeutic intervention in order to improve the following deficits and impairments:     Visit Diagnosis: Chronic low back pain without sciatica, unspecified back pain laterality  Abnormal posture  Spondylolisthesis of lumbar region     Problem List Patient Active Problem List   Diagnosis Date Noted  . Abdominal pain 03/10/2018  . Sciatica of right side 06/11/2017  . GERD (gastroesophageal reflux disease) 11/26/2016  . Right Achilles tendinitis 08/15/2016  . Hypertension 07/05/2016  . Adhesive capsulitis of right shoulder 02/21/2016  . Heel pain, bilateral 01/10/2016  . 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 History of colonic polyps   . Diverticulosis of colon without hemorrhage   . Hypothyroidism  08/10/2015  . Chronic constipation 08/05/2015  . Hyperlipidemia 08/05/2015    Darrel Hoover  PT 03/24/2018, 2:16 PM  [facility] [address] [place], [place], [address] Phone: [phone]   Fax:  [phone]  Name: Alexandra Brewer MRN: [id number] Date of Birth: 10/09/1955

## 2018-03-31 ENCOUNTER — Ambulatory Visit: Payer: Self-pay

## 2018-04-07 ENCOUNTER — Encounter (INDEPENDENT_AMBULATORY_CARE_PROVIDER_SITE_OTHER): Payer: Self-pay | Admitting: Orthopaedic Surgery

## 2018-04-07 ENCOUNTER — Ambulatory Visit (INDEPENDENT_AMBULATORY_CARE_PROVIDER_SITE_OTHER): Payer: Self-pay | Admitting: Orthopaedic Surgery

## 2018-04-07 DIAGNOSIS — G8929 Other chronic pain: Secondary | ICD-10-CM

## 2018-04-07 DIAGNOSIS — M4307 Spondylolysis, lumbosacral region: Secondary | ICD-10-CM

## 2018-04-07 DIAGNOSIS — M5441 Lumbago with sciatica, right side: Secondary | ICD-10-CM

## 2018-04-07 NOTE — Progress Notes (Signed)
The patient is a very pleasant 63 year old female who is non-English speaking.  She has a Spanish interpreter with her today.  She was originally supposed to see me for some type of cyst in her hand.  It turns out that it was a piece of glass that was in her hand that has since come out and she has no issues with her hand at all.  She actually saw my partner Dr. Louanne Skye at the end of November for right-sided sciatic symptoms.  He has had her on gabapentin and meloxicam.  She is doing much better overall with therapy and stretching.  She states that the gabapentin is made her too dizzy but the meloxicam is done well for her.  On exam her hand is normal.  She has negative straight leg raise to the right side a good strength on the right lower extremity.  At this point since she is doing well she can stop her gabapentin and I would just take meloxicam as needed.  All question concerns were answered and addressed.  Follow-up can be as needed.

## 2018-04-28 ENCOUNTER — Telehealth: Payer: Self-pay | Admitting: Internal Medicine

## 2018-04-28 ENCOUNTER — Other Ambulatory Visit: Payer: Self-pay | Admitting: Internal Medicine

## 2018-04-28 DIAGNOSIS — E039 Hypothyroidism, unspecified: Secondary | ICD-10-CM

## 2018-04-28 DIAGNOSIS — Z1239 Encounter for other screening for malignant neoplasm of breast: Secondary | ICD-10-CM

## 2018-04-28 MED ORDER — LEVOTHYROXINE SODIUM 112 MCG PO TABS: 112.0000 ug | ORAL_TABLET | Freq: Every day | ORAL | 1 refills | Status: DC

## 2018-04-28 MED FILL — AMLODIPINE BESYLATE 5 MG TA: 5 | 30 days supply | Qty: 30 | Fill #1

## 2018-04-28 MED FILL — LEVOTHYROXINE 112 MCG TAB: 112 | 30 days supply | Qty: 30 | Fill #0

## 2018-04-28 MED FILL — MELOXICAM 15 MG TABLET: 15 | 30 days supply | Qty: 30 | Fill #0

## 2018-04-28 NOTE — Telephone Encounter (Signed)
1) Medication(s) Requested (by name): levothyroxine 2) Pharmacy of Choice:  [facility]

## 2018-04-28 NOTE — Telephone Encounter (Signed)
Patient called requesting a Mammogram order her last one was 2 years ago   Thank You .

## 2018-04-29 NOTE — Telephone Encounter (Signed)
Will forward to pcp

## 2018-05-07 ENCOUNTER — Encounter (HOSPITAL_COMMUNITY): Payer: Self-pay | Admitting: Emergency Medicine

## 2018-05-07 ENCOUNTER — Ambulatory Visit (HOSPITAL_COMMUNITY)
Admission: EM | Admit: 2018-05-07 | Discharge: 2018-05-07 | Disposition: A | Discharge status: Home / Self Care | Payer: Self-pay | Attending: Internal Medicine | Admitting: Internal Medicine

## 2018-05-07 DIAGNOSIS — J4541 Moderate persistent asthma with (acute) exacerbation: Secondary | ICD-10-CM

## 2018-05-07 DIAGNOSIS — J029 Acute pharyngitis, unspecified: Secondary | ICD-10-CM

## 2018-05-07 DIAGNOSIS — R52 Pain, unspecified: Secondary | ICD-10-CM

## 2018-05-07 DIAGNOSIS — R05 Cough: Secondary | ICD-10-CM

## 2018-05-07 DIAGNOSIS — R51 Headache: Secondary | ICD-10-CM

## 2018-05-07 DIAGNOSIS — J111 Influenza due to unidentified influenza virus with other respiratory manifestations: Secondary | ICD-10-CM

## 2018-05-07 DIAGNOSIS — R69 Illness, unspecified: Principal | ICD-10-CM

## 2018-05-07 MED ORDER — AZITHROMYCIN 250 MG PO TABS: ORAL_TABLET | ORAL | 0 refills | Status: DC

## 2018-05-07 MED ORDER — OSELTAMIVIR PHOSPHATE 75 MG PO CAPS: 75.0000 mg | ORAL_CAPSULE | Freq: Two times a day (BID) | ORAL | 0 refills | Status: DC

## 2018-05-07 MED ORDER — IPRATROPIUM-ALBUTEROL 0.5-2.5 (3) MG/3ML IN SOLN
RESPIRATORY_TRACT | Status: AC
  Filled 2018-05-07: qty 3

## 2018-05-07 MED ORDER — ALBUTEROL SULFATE HFA 108 (90 BASE) MCG/ACT IN AERS: 2.0000 | INHALATION_SPRAY | Freq: Four times a day (QID) | RESPIRATORY_TRACT | 0 refills | Status: DC | PRN

## 2018-05-07 MED ORDER — GUAIFENESIN-CODEINE 100-10 MG/5ML PO SOLN: 5.0000 mL | Freq: Four times a day (QID) | ORAL | 0 refills | Status: AC | PRN

## 2018-05-07 MED ORDER — IPRATROPIUM-ALBUTEROL 0.5-2.5 (3) MG/3ML IN SOLN
3.0000 mL | Freq: Once | RESPIRATORY_TRACT | Status: AC
  Administered 2018-05-07: 3 mL via RESPIRATORY_TRACT

## 2018-05-07 MED ORDER — ALBUTEROL SULFATE (2.5 MG/3ML) 0.083% IN NEBU
INHALATION_SOLUTION | RESPIRATORY_TRACT | Status: AC
  Filled 2018-05-07: qty 3

## 2018-05-07 MED ORDER — ALBUTEROL SULFATE (2.5 MG/3ML) 0.083% IN NEBU
2.5000 mg | INHALATION_SOLUTION | Freq: Once | RESPIRATORY_TRACT | Status: AC
  Administered 2018-05-07: 2.5 mg via RESPIRATORY_TRACT

## 2018-05-07 NOTE — ED Triage Notes (Signed)
Pt sts cough and URI sx

## 2018-05-07 NOTE — ED Provider Notes (Signed)
[place]    CSN: [id number] Arrival date & time: 05/07/18  1416     History   Chief Complaint Chief Complaint  Patient presents with  . Cough    HPI Alexandra Brewer is a 63 y.o. female.   Onset of ST and cough since yesterday. Has been chilling and felt she had a fever last night. Has chest tightness specially with deep breathing and coughing. Has hx of asthma but had not been bothered by this a long time. Today has mild body aches and started having a HA. The cough is productive with small mucous with white mucous. Her husband was sick with the same thing.      Past Medical History:  Diagnosis Date  . Asthma   . Constipation   . Hyperlipemia   . Sciatica   . Thyroid disease     Patient Active Problem List   Diagnosis Date Noted  . Abdominal pain 03/10/2018  . Sciatica of right side 06/11/2017  . GERD (gastroesophageal reflux disease) 11/26/2016  . Right Achilles tendinitis 08/15/2016  . Hypertension 07/05/2016  . Adhesive capsulitis of right shoulder 02/21/2016  . Heel pain, bilateral 01/10/2016  . 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 History of colonic polyps   . Diverticulosis of colon without hemorrhage   . Hypothyroidism 08/10/2015  . Chronic constipation 08/05/2015  . Hyperlipidemia 08/05/2015    Past Surgical History:  Procedure Laterality Date  . CATARACT EXTRACTION    . COLONOSCOPY N/A 10/10/2015   Procedure: COLONOSCOPY;  Surgeon: Daneil Dolin, MD;  [facility];  Service: Endoscopy;  Laterality: N/A;  245 - Interpreter scheduled, do NOT move    OB History    Gravida  2   Para  1   Term  1   Preterm      AB  1   Living        SAB  1   TAB      Ectopic      Multiple      Live Births  1            Home Medications    Prior to Admission medications   Medication Sig Start Date End Date Taking? Authorizing Provider  amLODipine (NORVASC) 5 MG tablet Take 1 tablet (5 mg  total) by mouth daily. 06/11/17   Ladell Pier, MD  cetirizine (ZYRTEC) 10 MG tablet Take 1 tablet (10 mg total) by mouth daily. Prn itching 08/21/17   Argentina Donovan, PA-C  gabapentin (NEURONTIN) 300 MG capsule Take 1 capsule (300 mg total) by mouth at bedtime for 7 days, THEN 1 capsule (300 mg total) 2 (two) times daily. 02/10/18 03/19/18  Jessy Oto, MD  levothyroxine (SYNTHROID, LEVOTHROID) 112 MCG tablet Take 1 tablet (112 mcg total) by mouth daily. MUST MAKE APPT FOR FURTHER REFILLS 04/28/18   Ladell Pier, MD  linaclotide [facility]) 290 MCG CAPS capsule Take 1 capsule (290 mcg total) by mouth daily before breakfast. 03/10/18   Carlis Stable, NP  omeprazole (PRILOSEC) 20 MG capsule Take 1 capsule (20 mg total) by mouth daily. 03/10/18   Carlis Stable, NP  triamcinolone cream (KENALOG) 0.1 % Apply 1 application topically 2 (two) times daily. 03/13/18   Argentina Donovan, PA-C  Wheat Dextrin (BENEFIBER) POWD Take by mouth 2 (two) times daily. Takes 2 tbsp twice daily    [provider]    Family History Family  History  Problem Relation Age of Onset  . Diabetes Father   . Hypertension Father   . Stroke Maternal Grandmother   . Stroke Maternal Grandfather   . Colon cancer Neg Hx     Social History Social History   Tobacco Use  . Smoking status: Never Smoker  . Smokeless tobacco: Never Used  Substance Use Topics  . Alcohol use: No    Alcohol/week: 0.0 standard drinks  . Drug use: No     Allergies   Fish allergy; Penicillins; and Shellfish allergy   Review of Systems Review of Systems  Constitutional: Positive for appetite change, chills, diaphoresis and fever.  HENT: Positive for postnasal drip, rhinorrhea and trouble swallowing. Negative for congestion, dental problem, ear discharge, ear pain, facial swelling, mouth sores, sore throat and voice change.   Eyes: Negative for discharge.  Respiratory: Positive for cough, chest tightness, shortness of breath  and wheezing.   Cardiovascular: Negative for chest pain, palpitations and leg swelling.  Gastrointestinal: Positive for abdominal pain. Negative for diarrhea, nausea and vomiting.  Musculoskeletal: Positive for myalgias. Negative for gait problem.  Skin: Negative for rash.  Neurological: Positive for headaches. Negative for weakness.  Hematological: Negative for adenopathy.     Physical Exam Triage Vital Signs ED Triage Vitals  Enc Vitals Group     BP 05/07/18 1450 140/70     Pulse Rate 05/07/18 1450 88     Resp 05/07/18 1450 18     Temp 05/07/18 1450 98.3 F (36.8 C)     Temp Source 05/07/18 1450 Temporal     SpO2 05/07/18 1450 96 %     Weight --      Height --      Head Circumference --      Peak Flow --      Pain Score 05/07/18 1451 5     Pain Loc --      Pain Edu? --      Excl. in [place]? --    No data found.  Updated Vital Signs BP 140/70 (BP Location: Right Arm)   Pulse 88   Temp 98.3 F (36.8 C) (Temporal)   Resp 18   SpO2 96%   Visual Acuity Right Eye Distance:   Left Eye Distance:   Bilateral Distance:    Right Eye Near:   Left Eye Near:    Bilateral Near:     Physical Exam Vitals signs reviewed.  Constitutional:      Appearance: She is ill-appearing.  HENT:     Head: Normocephalic.     Comments: L TM pink and dull    Right Ear: Tympanic membrane and ear canal normal.     Left Ear: Ear canal and external ear normal.     Nose: Congestion and rhinorrhea present.     Mouth/Throat:     Mouth: Mucous membranes are moist.     Pharynx: No oropharyngeal exudate or posterior oropharyngeal erythema.     Comments: She does not appear in pain when she swallows.  Eyes:     General: No scleral icterus.       Right eye: No discharge.        Left eye: No discharge.     Conjunctiva/sclera: Conjunctivae normal.  Neck:     Musculoskeletal: Neck supple.  Cardiovascular:     Rate and Rhythm: Normal rate and regular rhythm.     Heart sounds: No murmur.    Pulmonary:     Breath sounds: Wheezing present.  No rales.     Comments: She is using accessory muscles when she breaths. After 2 cycles of neb treatments, her chest pressure resolved, she was breathing normal, and her pulse ox went to 98% at discharge.  Musculoskeletal: Normal range of motion.  Lymphadenopathy:     Cervical: No cervical adenopathy.  Skin:    General: Skin is warm and dry.  Neurological:     Mental Status: She is alert and oriented to person, place, and time.     Cranial Nerves: No cranial nerve deficit.     Motor: No weakness.     Coordination: Coordination normal.     Gait: Gait normal.  Psychiatric:        Mood and Affect: Mood normal.        Behavior: Behavior normal.        Thought Content: Thought content normal.        Judgment: Judgment normal.    UC Treatments / Results  Labs (all labs ordered are listed, but only abnormal results are displayed) Labs Reviewed - No data to display  EKG None  Radiology No results found.  Procedures Procedures   Medications Ordered in UC Medications - No data to display  Initial Impression / Assessment and Plan / UC Course  I have reviewed the triage vital signs and the nursing notes. She was given 2 cycles of neb treatment and responded well after the 2nd one.  I explained to pt I suspect she has influenza and  Asthma exacerbation, but I also will place her on Zpack to protect her from ending up with a secondary infection. She was told to use her inhaler q 4 h for 5 days, then prn after that. See instructions.   Final Clinical Impressions(s) / UC Diagnoses   Final diagnoses:  None   Discharge Instructions   None    ED Prescriptions    None     Controlled Substance Prescriptions [place] Controlled Substance Registry consulted?    Shelby Mattocks, [place] 05/07/18 1722

## 2018-05-07 NOTE — Discharge Instructions (Addendum)
Yo sospecho que tienes flu and tu asma esta molestandote por eso. Te voy a dar Tamiflu para la flu, mas Albuterol inhelador, y un antibiotico para cubrir  bronchitis y que no te de nada mas serio.  Tambiednte estoy dando un jarabe para la toz.   Si te pones peor en 48 horas regresa aqui.

## 2018-05-08 MED FILL — AZITHROMYCIN 250 MG TABLET: 250 | 5 days supply | Qty: 6 | Fill #0

## 2018-05-08 MED FILL — OSELTAMIVIR PHOSPHATE 75 MG: 75 | 5 days supply | Qty: 10 | Fill #0

## 2018-05-08 MED FILL — !VENTOLIN HFA INHALER: 108 (90 BAS | 25 days supply | Qty: 18 | Fill #0

## 2018-06-02 ENCOUNTER — Inpatient Hospital Stay: Payer: Self-pay | Admitting: Family Medicine

## 2018-06-25 ENCOUNTER — Telehealth: Payer: Self-pay

## 2018-06-25 NOTE — Telephone Encounter (Signed)
[employer] Olegario Shearer  Id# [id number] contacted pt and lvm asking pt to give me a call at her earlierst convenience

## 2018-06-25 NOTE — Telephone Encounter (Signed)
Contacted pt to make her aware that referral for her mammogram has already been put in on 04/29/2018. Also wanted to provide pt with the scholarship number because she doesn't have any insurance

## 2018-06-30 ENCOUNTER — Ambulatory Visit: Payer: Self-pay | Admitting: Internal Medicine

## 2018-08-15 ENCOUNTER — Ambulatory Visit: Payer: Self-pay | Admitting: Internal Medicine

## 2018-08-18 ENCOUNTER — Telehealth: Payer: Self-pay

## 2018-08-18 NOTE — Telephone Encounter (Signed)
[employer] Izora Gala  Id# [id number]  contacted pt to make aware about her mammogram referral being placed on 04/29/18. Pt didn't answer left a detailed vm informing pt that Dr. Wynetta Emery has placed mammogram referral back on 04/29/18. I provided pt with the [facility] number and [facility] number. Informed pt that she will need to give them a call to schedule and to do the scholarship because she doesn't have any insurance. Informed pt that if she has any questions or concerns to give me a call and that her appointment for 08/19/18 will be cancelled.

## 2018-08-19 ENCOUNTER — Other Ambulatory Visit: Payer: Self-pay | Admitting: Internal Medicine

## 2018-08-19 ENCOUNTER — Ambulatory Visit: Payer: Self-pay | Admitting: Internal Medicine

## 2018-08-19 DIAGNOSIS — I10 Essential (primary) hypertension: Secondary | ICD-10-CM

## 2018-08-19 DIAGNOSIS — E785 Hyperlipidemia, unspecified: Secondary | ICD-10-CM

## 2018-08-19 MED FILL — MELOXICAM 15 MG TABLET: 15 | 30 days supply | Qty: 30 | Fill #1

## 2018-08-19 MED FILL — AMLODIPINE BESYLATE 5 MG TA: 5 | 30 days supply | Qty: 30 | Fill #0

## 2018-08-19 MED FILL — LEVOTHYROXINE 112 MCG TAB: 112 | 30 days supply | Qty: 30 | Fill #1

## 2018-08-19 MED FILL — OMEPRAZOLE 20 MG CAP: 20 | 30 days supply | Qty: 30 | Fill #1

## 2018-08-19 MED FILL — ATORVASTATIN 20 MG TABLET: 20 | 30 days supply | Qty: 30 | Fill #0

## 2018-09-04 ENCOUNTER — Other Ambulatory Visit: Payer: Self-pay

## 2018-09-04 ENCOUNTER — Ambulatory Visit: Payer: Self-pay | Attending: Family Medicine | Admitting: Family Medicine

## 2018-09-04 ENCOUNTER — Encounter: Payer: Self-pay | Admitting: Family Medicine

## 2018-09-04 DIAGNOSIS — M25562 Pain in left knee: Secondary | ICD-10-CM

## 2018-09-04 MED ORDER — DICLOFENAC SODIUM 1 % TD GEL: 4.0000 g | Freq: Four times a day (QID) | TRANSDERMAL | 6 refills | Status: DC

## 2018-09-04 MED FILL — DICLOFENAC SODIUM 1% GEL: 1 | 16 days supply | Qty: 100 | Fill #0

## 2018-09-04 NOTE — Progress Notes (Signed)
Left leg pain in the back of her knee and she can not bend down at all and both legs feels heavy. Per pt this has been going on for 3 wks now. Per pt she is only taking Mobic and it helps for 2-3 days only and the pain comes back again. Per pt she stands at work all day.   Med refills

## 2018-09-04 NOTE — Progress Notes (Signed)
Virtual Visit via Telephone Note  I connected with Alexandra Brewer on 09/14/18 at  3:10 PM EDT by telephone and verified that I am speaking with the correct person using two identifiers.   I discussed the limitations, risks, security and privacy concerns of performing an evaluation and management service by telephone and the availability of in person appointments. I also discussed with the patient that there may be a patient responsible charge related to this service. The patient expressed understanding and agreed to proceed.  Patient Location: Home Provider Location: Office Others participating in call: Emilio Aspen, RMA who arranged for an interpreter through [employer] for a Spanish interpreter due to a language barrier. Felix Ahmadi ID # [id number]   History of Present Illness:      63 year old female who reports recent onset of pain in her left knee and especially behind the left knee.  She feels as if she may also have some mild swelling of her left knee.  She recalls no distinct injury to her knee but she has to be very active at her job.  Knee pain is dull and aching but sometimes has a sharp pain in the back of her left knee.  Pain is about a 6 on a 0-to-10 scale.  Over-the-counter pain medications have not really reduced her knee pain.  Sometimes she feels as if she has difficulty fully straightening out or fully bending her knee.         Past Medical History:  Diagnosis Date  . Asthma   . Constipation   . Hyperlipemia   . Sciatica   . Thyroid disease     Past Surgical History:  Procedure Laterality Date  . CATARACT EXTRACTION    . COLONOSCOPY N/A 10/10/2015   Procedure: COLONOSCOPY;  Surgeon: Daneil Dolin, MD;  [facility];  Service: Endoscopy;  Laterality: N/A;  245 - Interpreter scheduled, do NOT move    Family History  Problem Relation Age of Onset  . Diabetes Father   . Hypertension Father   . Stroke Maternal Grandmother   . Stroke  Maternal Grandfather   . Colon cancer Neg Hx     Social History   Tobacco Use  . Smoking status: Never Smoker  . Smokeless tobacco: Never Used  Substance Use Topics  . Alcohol use: No    Alcohol/week: 0.0 standard drinks  . Drug use: No    Allergies: Penicillin and shellfish-throat swelling    Observations/Objective: No vital signs or physical exam conducted as visit was done via telephone  Assessment and Plan: 1. Acute pain of left knee Patient will be referred to orthopedics for further evaluation and treatment.  I suspect that she may have a Baker's cyst which can be associated with damage to the cartilage/meniscus.  Patient may apply cool compresses/cold packs to the area of pain/swelling over protected skin for 15 to 20 minutes a few times per day to help with pain and swelling.  Prescription provided for diclofenac gel to use up to 4 times daily to help with pain and inflammation. - AMB referral to orthopedics - diclofenac sodium (VOLTAREN) 1 % GEL; Apply 4 g topically 4 (four) times daily. For joint pain  Dispense: 1 Tube; Refill: 6  Follow Up Instructions:Return if symptoms worsen or fail to improve, for keep scheduled follow-up with PCP.    I discussed the assessment and treatment plan with the patient. The patient was provided an opportunity to ask questions and all were answered.  The patient agreed with the plan and demonstrated an understanding of the instructions.   The patient was advised to call back or seek an in-person evaluation if the symptoms worsen or if the condition fails to improve as anticipated.  I provided 7 minutes of non-face-to-face time during this encounter.   Antony Blackbird, MD

## 2018-09-08 ENCOUNTER — Other Ambulatory Visit: Payer: Self-pay

## 2018-09-08 ENCOUNTER — Ambulatory Visit: Payer: Self-pay | Attending: Internal Medicine

## 2018-09-29 ENCOUNTER — Other Ambulatory Visit: Payer: Self-pay

## 2018-09-29 ENCOUNTER — Encounter: Payer: Self-pay | Admitting: Orthopaedic Surgery

## 2018-09-29 ENCOUNTER — Ambulatory Visit (INDEPENDENT_AMBULATORY_CARE_PROVIDER_SITE_OTHER): Payer: Self-pay

## 2018-09-29 ENCOUNTER — Ambulatory Visit (INDEPENDENT_AMBULATORY_CARE_PROVIDER_SITE_OTHER): Payer: Self-pay | Admitting: Orthopaedic Surgery

## 2018-09-29 DIAGNOSIS — G8929 Other chronic pain: Secondary | ICD-10-CM

## 2018-09-29 DIAGNOSIS — M25562 Pain in left knee: Secondary | ICD-10-CM

## 2018-09-29 MED ORDER — LIDOCAINE HCL 1 % IJ SOLN
3.0000 mL | INTRAMUSCULAR | Status: AC | PRN
  Administered 2018-09-29: 3 mL

## 2018-09-29 MED ORDER — METHYLPREDNISOLONE ACETATE 40 MG/ML IJ SUSP
40.0000 mg | INTRAMUSCULAR | Status: AC | PRN
  Administered 2018-09-29: 40 mg via INTRA_ARTICULAR

## 2018-09-29 NOTE — Progress Notes (Signed)
Office Visit Note   Patient: Alexandra Brewer           Date of Birth: 12/05/1955           MRN: [id number] Visit Date: 09/29/2018              Requested by: Ladell Pier, MD [address] [place],  [place] [address] PCP: Ladell Pier, MD   Assessment & Plan: Visit Diagnoses:  1. Chronic pain of left knee     Plan: There does appear to be an inflammatory process going on with her knee and I did recommend a steroid injection in her left knee.  I explained the risk and benefits of steroid injections and she agreed to try this.  She will continue her meloxicam as well.  She did tolerate the steroid injection well.  I would like to see her back in 3 weeks if she is still symptomatic because at that point we would consider setting her up for a MRI of the left knee if she remains symptomatic.  All question concerns were otherwise answered and addressed.  Follow-Up Instructions: Return in about 3 weeks (around 10/20/2018).   Orders:  Orders Placed This Encounter  Procedures  . Large Joint Inj: L knee  . XR Knee 1-2 Views Left   No orders of the defined types were placed in this encounter.     Procedures: Large Joint Inj: L knee on 09/29/2018 4:03 PM Indications: diagnostic evaluation and pain Details: 22 G 1.5 in needle, superolateral approach  Arthrogram: No  Medications: 3 mL lidocaine 1 %; 40 mg methylPREDNISolone acetate 40 MG/ML Outcome: tolerated well, no immediate complications Procedure, treatment alternatives, risks and benefits explained, specific risks discussed. Consent was given by the patient. Immediately prior to procedure a time out was called to verify the correct patient, procedure, equipment, support staff and site/side marked as required. Patient was prepped and draped in the usual sterile fashion.       Clinical Data: No additional findings.   Subjective: Chief Complaint  Patient presents with  . Left Knee - Pain  The patient is a  very pleasant 63 year old nonspeaking female who comes in for evaluation treatment of left knee pain.  Her left knee is been hurting and swelling for several weeks now.  It does not wake her up at night but hurts with weightbearing and standing increases her pain.  She points to the lateral aspect of her knee and mainly posterior lateral.  Is pretty much been a constant ache now with no known injury.  She is someone that does take meloxicam and that has helped.  She has been on gabapentin for sciatica but that makes her too sleepy feeling.  She has an interpreter with her today.  She reports some swelling with that knee as well.  She is never had surgery on her knee either.  HPI  Review of Systems She currently denies any headache, chest pain, shortness of breath, fever, chills, nausea, vomiting  Objective: Vital Signs: There were no vitals taken for this visit.  Physical Exam She is alert and orient x3 and in no acute distress Ortho Exam Examination of her left and right knee shows there is just a slight effusion of the left knee comparing the left and right.  There is slightly more fullness in the popliteal area comparing the left and right knees with the left knee being the problematic and painful knee.  Her range of motion is full  and she has some global tenderness.  There is slight patellofemoral crepitation.  The knee feels ligamentously stable. Specialty Comments:  No specialty comments available.  Imaging: Xr Knee 1-2 Views Left  Result Date: 09/29/2018 2 views of the left knee show no acute findings.  There is only mild arthritic changes.    PMFS History: Patient Active Problem List   Diagnosis Date Noted  . Abdominal pain 03/10/2018  . Sciatica of right side 06/11/2017  . GERD (gastroesophageal reflux disease) 11/26/2016  . Right Achilles tendinitis 08/15/2016  . Hypertension 07/05/2016  . Adhesive capsulitis of right shoulder 02/21/2016  . Heel pain, bilateral 01/10/2016   . 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 History of colonic polyps   . Diverticulosis of colon without hemorrhage   . Hypothyroidism 08/10/2015  . Chronic constipation 08/05/2015  . Hyperlipidemia 08/05/2015   Past Medical History:  Diagnosis Date  . Asthma   . Constipation   . Hyperlipemia   . Sciatica   . Thyroid disease     Family History  Problem Relation Age of Onset  . Diabetes Father   . Hypertension Father   . Stroke Maternal Grandmother   . Stroke Maternal Grandfather   . Colon cancer Neg Hx     Past Surgical History:  Procedure Laterality Date  . CATARACT EXTRACTION    . COLONOSCOPY N/A 10/10/2015   Procedure: COLONOSCOPY;  Surgeon: Daneil Dolin, MD;  [facility];  Service: Endoscopy;  Laterality: N/A;  245 - Interpreter scheduled, do NOT move   Social History   Occupational History  . Not on file  Tobacco Use  . Smoking status: Never Smoker  . Smokeless tobacco: Never Used  Substance and Sexual Activity  . Alcohol use: No    Alcohol/week: 0.0 standard drinks  . Drug use: No  . Sexual activity: Not Currently

## 2018-10-20 ENCOUNTER — Ambulatory Visit: Payer: Self-pay | Admitting: Orthopaedic Surgery

## 2018-12-02 ENCOUNTER — Other Ambulatory Visit: Payer: Self-pay | Admitting: Internal Medicine

## 2018-12-02 DIAGNOSIS — E039 Hypothyroidism, unspecified: Secondary | ICD-10-CM

## 2018-12-02 DIAGNOSIS — I10 Essential (primary) hypertension: Secondary | ICD-10-CM

## 2018-12-02 MED FILL — MELOXICAM 15 MG TABLET: 15 | 30 days supply | Qty: 30 | Fill #2

## 2018-12-02 MED FILL — DICLOFENAC SODIUM 1% GEL: 1 | 6 days supply | Qty: 100 | Fill #1

## 2018-12-03 MED FILL — LEVOTHYROXINE 112 MCG TAB: 112 | 30 days supply | Qty: 30 | Fill #0

## 2018-12-03 MED FILL — AMLODIPINE BESYLATE 5 MG TA: 5 | 30 days supply | Qty: 30 | Fill #0

## 2018-12-24 ENCOUNTER — Other Ambulatory Visit (HOSPITAL_COMMUNITY): Payer: Self-pay | Admitting: *Deleted

## 2018-12-24 DIAGNOSIS — Z1231 Encounter for screening mammogram for malignant neoplasm of breast: Secondary | ICD-10-CM

## 2018-12-29 ENCOUNTER — Other Ambulatory Visit: Payer: Self-pay

## 2018-12-29 ENCOUNTER — Ambulatory Visit (HOSPITAL_COMMUNITY)
Admission: EM | Admit: 2018-12-29 | Discharge: 2018-12-29 | Disposition: A | Discharge status: Home / Self Care | Payer: Self-pay | Attending: Family Medicine | Admitting: Family Medicine

## 2018-12-29 ENCOUNTER — Encounter (HOSPITAL_COMMUNITY): Payer: Self-pay

## 2018-12-29 DIAGNOSIS — J4521 Mild intermittent asthma with (acute) exacerbation: Secondary | ICD-10-CM

## 2018-12-29 MED ORDER — METHYLPREDNISOLONE SODIUM SUCC 125 MG IJ SOLR
125.0000 mg | Freq: Once | INTRAMUSCULAR | Status: AC
  Administered 2018-12-29: 125 mg via INTRAMUSCULAR

## 2018-12-29 MED ORDER — METHYLPREDNISOLONE SODIUM SUCC 125 MG IJ SOLR
INTRAMUSCULAR | Status: AC
  Filled 2018-12-29: qty 2

## 2018-12-29 MED ORDER — PREDNISONE 20 MG PO TABS: 40.0000 mg | ORAL_TABLET | Freq: Every day | ORAL | 0 refills | Status: AC

## 2018-12-29 MED FILL — predniSONE 20 MG TABS: 20 | 5 days supply | Qty: 10 | Fill #0

## 2018-12-29 NOTE — ED Triage Notes (Signed)
Patient presents to Urgent Care with complaints of asthma exacerbation since 3 days ago. Patient reports she feels like her allergies are making her breathing worse. Pt states she does have an inhaler at home, has used it today but thinks she may need an antibiotic to fight off her respiratory infection.  Spanish interpreter utilized during triage.

## 2018-12-29 NOTE — ED Provider Notes (Signed)
Alexandra Brewer    CSN: [id number] Arrival date & time: 12/29/18  1044      History   Chief Complaint Chief Complaint  Patient presents with  . Asthma    HPI Alexandra Brewer is a 63 y.o. female.   Alexandra Brewer presents with complaints of wheezing, shortness of breath, asthma flare. Some cough. Started approximately 3 days ago with increased allergy symptoms, but much worse yesterday and today. Has been using her inhaler some which hasn't helped much. No fevers. Throat is irritated from coughing. No known ill contacts. No headache or body aches. Has had similar in the past. Wasn't able to get in with her PCP. Takes cetirizine daily. History  Of asthma, constipation, hyperlipidemia, sciatica, thyroid disease, htn.   Spanish video interpreter used to collect history and physical exam.    ROS per HPI, negative if not otherwise mentioned.      Past Medical History:  Diagnosis Date  . Asthma   . Constipation   . Hyperlipemia   . Sciatica   . Thyroid disease     Patient Active Problem List   Diagnosis Date Noted  . Abdominal pain 03/10/2018  . Sciatica of right side 06/11/2017  . GERD (gastroesophageal reflux disease) 11/26/2016  . Right Achilles tendinitis 08/15/2016  . Hypertension 07/05/2016  . Adhesive capsulitis of right shoulder 02/21/2016  . Heel pain, bilateral 01/10/2016  . 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 History of colonic polyps   . Diverticulosis of colon without hemorrhage   . Hypothyroidism 08/10/2015  . Chronic constipation 08/05/2015  . Hyperlipidemia 08/05/2015    Past Surgical History:  Procedure Laterality Date  . CATARACT EXTRACTION    . COLONOSCOPY N/A 10/10/2015   Procedure: COLONOSCOPY;  Surgeon: Daneil Dolin, MD;  Location: [facility];  Service: Endoscopy;  Laterality: N/A;  245 - Interpreter scheduled, do NOT move    OB History    Gravida  2   Para  1   Term  1   Preterm      AB   1   Living        SAB  1   TAB      Ectopic      Multiple      Live Births  1            Home Medications    Prior to Admission medications   Medication Sig Start Date End Date Taking? Authorizing Provider  albuterol (PROVENTIL HFA;VENTOLIN HFA) 108 (90 Base) MCG/ACT inhaler Inhale 2 puffs into the lungs every 6 (six) hours as needed for wheezing or shortness of breath. 05/07/18   Rodriguez-Southworth, Sunday Spillers, PA-C  amLODipine (NORVASC) 5 MG tablet Take 1 tablet (5 mg total) by mouth daily. 12/03/18   Ladell Pier, MD  atorvastatin (LIPITOR) 20 MG tablet Take 1 tablet (20 mg total) by mouth daily. Needs appt for more refills. Patient not taking: Reported on 09/04/2018 08/19/18   Ladell Pier, MD  cetirizine (ZYRTEC) 10 MG tablet Take 1 tablet (10 mg total) by mouth daily. Prn itching 08/21/17   Argentina Donovan, PA-C  diclofenac sodium (VOLTAREN) 1 % GEL Apply 4 g topically 4 (four) times daily. For joint pain 09/04/18   Fulp, Cammie, MD  gabapentin (NEURONTIN) 300 MG capsule Take 1 capsule (300 mg total) by mouth at bedtime for 7 days, THEN 1 capsule (300 mg total) 2 (two) times daily. 02/10/18 03/19/18  Basil Dess  E, MD  levothyroxine (SYNTHROID) 112 MCG tablet Take 1 tablet (112 mcg total) by mouth daily. 12/03/18   Ladell Pier, MD  linaclotide [facility]) 290 MCG CAPS capsule Take 1 capsule (290 mcg total) by mouth daily before breakfast. 03/10/18   Carlis Stable, NP  omeprazole (PRILOSEC) 20 MG capsule Take 1 capsule (20 mg total) by mouth daily. 03/10/18   Carlis Stable, NP  predniSONE (DELTASONE) 20 MG tablet Take 2 tablets (40 mg total) by mouth daily with breakfast for 5 days. 12/29/18 01/03/19  Augusto Gamble B, NP  triamcinolone cream (KENALOG) 0.1 % Apply 1 application topically 2 (two) times daily. Patient not taking: Reported on 09/04/2018 03/13/18   Argentina Donovan, PA-C  Wheat Dextrin (BENEFIBER) POWD Take by mouth 2 (two) times daily. Takes 2 tbsp twice daily     [provider]    Family History Family History  Problem Relation Age of Onset  . Diabetes Father   . Hypertension Father   . Asthma Father   . Stroke Maternal Grandmother   . Stroke Maternal Grandfather   . Colon cancer Neg Hx     Social History Social History   Tobacco Use  . Smoking status: Never Smoker  . Smokeless tobacco: Never Used  Substance Use Topics  . Alcohol use: No    Alcohol/week: 0.0 standard drinks  . Drug use: No     Allergies   Fish allergy, Penicillins, and Shellfish allergy   Review of Systems Review of Systems   Physical Exam Triage Vital Signs ED Triage Vitals  Enc Vitals Group     BP 12/29/18 1112 (!) 149/79     Pulse Rate 12/29/18 1112 98     Resp 12/29/18 1112 16     Temp 12/29/18 1112 98.6 F (37 C)     Temp Source 12/29/18 1112 Temporal     SpO2 12/29/18 1112 100 %     Weight --      Height --      Head Circumference --      Peak Flow --      Pain Score 12/29/18 1114 0     Pain Loc --      Pain Edu? --      Excl. in [place]? --    No data found.  Updated Vital Signs BP (!) 149/79 (BP Location: Right Arm)   Pulse 98   Temp 98.6 F (37 C) (Temporal)   Resp 16   SpO2 100%    Physical Exam Constitutional:      General: She is not in acute distress.    Appearance: She is well-developed.  Cardiovascular:     Rate and Rhythm: Normal rate and regular rhythm.     Heart sounds: Normal heart sounds.  Pulmonary:     Effort: Pulmonary effort is normal. No respiratory distress.     Breath sounds: Wheezing present.     Comments: Mild tachypnea noted with speaking; wheezes throughout noted  Skin:    General: Skin is warm and dry.  Neurological:     Mental Status: She is alert and oriented to person, place, and time.      UC Treatments / Results  Labs (all labs ordered are listed, but only abnormal results are displayed) Labs Reviewed - No data to display  EKG   Radiology No results found.  Procedures  Procedures (including critical care time)  Medications Ordered in UC Medications  methylPREDNISolone sodium succinate (SOLU-MEDROL) 125 mg/2  mL injection 125 mg (125 mg Intramuscular Given 12/29/18 1214)  methylPREDNISolone sodium succinate (SOLU-MEDROL) 125 mg/2 mL injection (has no administration in time range)    Initial Impression / Assessment and Plan / UC Course  I have reviewed the triage vital signs and the nursing notes.  Pertinent labs & imaging results that were available during my care of the patient were reviewed by me and considered in my medical decision making (see chart for details).     Afebrile. No hypoxia. No increased work of breathing. Increased rate with speaking. Wheezing. Consistent with asthma flare. Solumedrol provided, course of prednisone as well. Continue with zyrtec. Return precautions provided. Patient verbalized understanding and agreeable to plan.    Final Clinical Impressions(s) / UC Diagnoses   Final diagnoses:  Mild intermittent asthma with exacerbation     Discharge Instructions     5 days of prednisone.  Use of inhaler as needed for wheezing or shortness of breath.   If symptoms worsen or do not improve in the next week to return to be seen or to follow up with your PCP.     ED Prescriptions    Medication Sig Dispense Auth. Provider   predniSONE (DELTASONE) 20 MG tablet Take 2 tablets (40 mg total) by mouth daily with breakfast for 5 days. 10 tablet Zigmund Gottron, NP     PDMP not reviewed this encounter.   Zigmund Gottron, NP 12/29/18 1309

## 2018-12-29 NOTE — Discharge Instructions (Signed)
5 days of prednisone.  Use of inhaler as needed for wheezing or shortness of breath.   If symptoms worsen or do not improve in the next week to return to be seen or to follow up with your PCP.

## 2018-12-30 ENCOUNTER — Other Ambulatory Visit: Payer: Self-pay

## 2018-12-30 DIAGNOSIS — Z20822 Contact with and (suspected) exposure to covid-19: Secondary | ICD-10-CM

## 2018-12-31 LAB — NOVEL CORONAVIRUS, NAA: SARS-CoV-2, NAA: NOT DETECTED

## 2019-01-02 ENCOUNTER — Telehealth: Payer: Self-pay | Admitting: Internal Medicine

## 2019-01-02 NOTE — Telephone Encounter (Signed)
Patient called wanting to know her COVID-19 results states they were done in the ED. Please follow up.

## 2019-01-02 NOTE — Telephone Encounter (Signed)
Patient was negative

## 2019-01-12 ENCOUNTER — Telehealth: Payer: Self-pay | Admitting: Internal Medicine

## 2019-01-12 NOTE — Telephone Encounter (Signed)
Patient called requesting her [place] letter. Checked patients documents and did not see any letter. Please f/u with patient.

## 2019-01-12 NOTE — Telephone Encounter (Signed)
I called Pt an informed her, LVM  Application scanned to [id number] however patient does not currently have any accounts PB or [facility]  or future appointments scheduled that would qualify for financial assistance, no further action needed as this time, she need to reschedule a new appt and resubmit all documents and application again

## 2019-01-12 NOTE — Telephone Encounter (Signed)
Patient returned call and was informed of COVID-19 results being negative.

## 2019-01-22 ENCOUNTER — Other Ambulatory Visit: Payer: Self-pay

## 2019-01-22 ENCOUNTER — Encounter: Payer: Self-pay | Admitting: Internal Medicine

## 2019-01-22 ENCOUNTER — Ambulatory Visit (HOSPITAL_BASED_OUTPATIENT_CLINIC_OR_DEPARTMENT_OTHER): Payer: Self-pay | Admitting: Pharmacist

## 2019-01-22 ENCOUNTER — Ambulatory Visit: Payer: Self-pay | Attending: Internal Medicine | Admitting: Internal Medicine

## 2019-01-22 VITALS — BP 126/76 | HR 73 | Temp 99.2°F | Resp 16 | Wt 164.4 lb

## 2019-01-22 DIAGNOSIS — Z23 Encounter for immunization: Secondary | ICD-10-CM

## 2019-01-22 DIAGNOSIS — E785 Hyperlipidemia, unspecified: Secondary | ICD-10-CM

## 2019-01-22 DIAGNOSIS — T2122XA Burn of second degree of abdominal wall, initial encounter: Secondary | ICD-10-CM

## 2019-01-22 DIAGNOSIS — I10 Essential (primary) hypertension: Secondary | ICD-10-CM

## 2019-01-22 DIAGNOSIS — E039 Hypothyroidism, unspecified: Secondary | ICD-10-CM

## 2019-01-22 DIAGNOSIS — M5431 Sciatica, right side: Secondary | ICD-10-CM

## 2019-01-22 DIAGNOSIS — J3089 Other allergic rhinitis: Secondary | ICD-10-CM

## 2019-01-22 MED ORDER — LORATADINE 10 MG PO TABS: 10.0000 mg | ORAL_TABLET | Freq: Every day | ORAL | 3 refills | Status: DC

## 2019-01-22 MED ORDER — SILVER SULFADIAZINE 1 % EX CREA: TOPICAL_CREAM | CUTANEOUS | 0 refills | Status: DC

## 2019-01-22 MED ORDER — ATORVASTATIN CALCIUM 20 MG PO TABS: 20.0000 mg | ORAL_TABLET | Freq: Every day | ORAL | 4 refills | Status: DC

## 2019-01-22 MED ORDER — MELOXICAM 15 MG PO TABS: 15.0000 mg | ORAL_TABLET | Freq: Every day | ORAL | 3 refills | Status: DC

## 2019-01-22 MED ORDER — FLUTICASONE PROPIONATE 50 MCG/ACT NA SUSP: 2.0000 | Freq: Every day | NASAL | 2 refills | Status: DC | PRN

## 2019-01-22 MED FILL — MELOXICAM 15 MG TABLET: 15 | 30 days supply | Qty: 30 | Fill #0

## 2019-01-22 MED FILL — ?ATORVASTATIN 20 MG TABLET: 20 | 30 days supply | Qty: 30 | Fill #0

## 2019-01-22 MED FILL — FLUTICASONE PROP 50 MCG SPR: 50 | 30 days supply | Qty: 16 | Fill #0

## 2019-01-22 MED FILL — SSD 1% CREAM: 1 | 10 days supply | Qty: 25 | Fill #0

## 2019-01-22 NOTE — Patient Instructions (Signed)
Influenza Virus Vaccine injection (Fluarix) What is this medicine? INFLUENZA VIRUS VACCINE (in floo EN zuh VAHY ruhs vak SEEN) helps to reduce the risk of getting influenza also known as the flu. This medicine may be used for other purposes; ask your health care provider or pharmacist if you have questions. COMMON BRAND NAME(S): Fluarix, Fluzone What should I tell my health care provider before I take this medicine? They need to know if you have any of these conditions:  bleeding disorder like hemophilia  fever or infection  Guillain-Barre syndrome or other neurological problems  immune system problems  infection with the human immunodeficiency virus (HIV) or AIDS  low blood platelet counts  multiple sclerosis  an unusual or allergic reaction to influenza virus vaccine, eggs, chicken proteins, latex, gentamicin, other medicines, foods, dyes or preservatives  pregnant or trying to get pregnant  breast-feeding How should I use this medicine? This vaccine is for injection into a muscle. It is given by a health care professional. A copy of Vaccine Information Statements will be given before each vaccination. Read this sheet carefully each time. The sheet may change frequently. Talk to your pediatrician regarding the use of this medicine in children. Special care may be needed. Overdosage: If you think you have taken too much of this medicine contact a poison control center or emergency room at once. NOTE: This medicine is only for you. Do not share this medicine with others. What if I miss a dose? This does not apply. What may interact with this medicine?  chemotherapy or radiation therapy  medicines that lower your immune system like etanercept, anakinra, infliximab, and adalimumab  medicines that treat or prevent blood clots like warfarin  phenytoin  steroid medicines like prednisone or cortisone  theophylline  vaccines This list may not describe all possible  interactions. Give your health care provider a list of all the medicines, herbs, non-prescription drugs, or dietary supplements you use. Also tell them if you smoke, drink alcohol, or use illegal drugs. Some items may interact with your medicine. What should I watch for while using this medicine? Report any side effects that do not go away within 3 days to your doctor or health care professional. Call your health care provider if any unusual symptoms occur within 6 weeks of receiving this vaccine. You may still catch the flu, but the illness is not usually as bad. You cannot get the flu from the vaccine. The vaccine will not protect against colds or other illnesses that may cause fever. The vaccine is needed every year. What side effects may I notice from receiving this medicine? Side effects that you should report to your doctor or health care professional as soon as possible:  allergic reactions like skin rash, itching or hives, swelling of the face, lips, or tongue Side effects that usually do not require medical attention (report to your doctor or health care professional if they continue or are bothersome):  fever  headache  muscle aches and pains  pain, tenderness, redness, or swelling at site where injected  weak or tired This list may not describe all possible side effects. Call your doctor for medical advice about side effects. You may report side effects to FDA at 1-800-FDA-1088. Where should I keep my medicine? This vaccine is only given in a clinic, pharmacy, doctor's office, or other health care setting and will not be stored at home. NOTE: This sheet is a summary. It may not cover all possible information. If you have questions   about this medicine, talk to your doctor, pharmacist, or health care provider.  2020 Elsevier/Gold Standard (2007-10-15 09:30:40)  

## 2019-01-22 NOTE — Progress Notes (Signed)
Patient ID: Alexandra Brewer, female    DOB: 15-Dec-1955  MRN: [id number]  CC: Hospitalization Follow-up (ED)   Subjective: Alexandra Brewer is a 63 y.o. female who presents for chronic ds management/hosp f/u Her concerns today include:  HTN, thyroid ds, HL, GERD, Frozen shoulder RT  Beginning of yr she had flu symptoms and allergies.  Seen in ER 1 mth ago with SOB, cough Dx with asthma many yrs ago Symptoms at this time include: Watery eyes, HA, itchy throat, sneezing No wheezing Feels allergy med no longer working.  On Zyrtec once a day  HTN:  Compliant with Norvasc.  No chest pains or shortness of breath.  No lower extremity edema.  She tries to limit salt in the foods.  Hypothyroid:  compliant with taking levothyroxine.  Denies feeling cold or hot all the time.  No major changes in weight.  HL:  Compliant with Lipitor.  No muscle aches  Request RF on Meloxicam 15 mg which was  prescribed by Dr. Louanne Skye for sciatica   She also complains of having sustained a burn to the abdomen about 8 days ago while working in the kitchen.  Initially it was a big blister the blister has popped.    HM:  Needs f;u shot.  MMG scheduled for next mth  Patient Active Problem List   Diagnosis Date Noted  . Abdominal pain 03/10/2018  . Sciatica of right side 06/11/2017  . GERD (gastroesophageal reflux disease) 11/26/2016  . Right Achilles tendinitis 08/15/2016  . Hypertension 07/05/2016  . Adhesive capsulitis of right shoulder 02/21/2016  . Heel pain, bilateral 01/10/2016  . 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 History of colonic polyps   . Diverticulosis of colon without hemorrhage   . Hypothyroidism 08/10/2015  . Chronic constipation 08/05/2015  . Hyperlipidemia 08/05/2015     Current Outpatient Medications on File Prior to Visit  Medication Sig Dispense Refill  . albuterol (PROVENTIL HFA;VENTOLIN HFA) 108 (90 Base) MCG/ACT inhaler Inhale 2 puffs into the  lungs every 6 (six) hours as needed for wheezing or shortness of breath. 1 Inhaler 0  . amLODipine (NORVASC) 5 MG tablet Take 1 tablet (5 mg total) by mouth daily. 30 tablet 2  . diclofenac sodium (VOLTAREN) 1 % GEL Apply 4 g topically 4 (four) times daily. For joint pain 1 Tube 6  . levothyroxine (SYNTHROID) 112 MCG tablet Take 1 tablet (112 mcg total) by mouth daily. 30 tablet 2  . omeprazole (PRILOSEC) 20 MG capsule Take 1 capsule (20 mg total) by mouth daily. 90 capsule 3  . Wheat Dextrin (BENEFIBER) POWD Take by mouth 2 (two) times daily. Takes 2 tbsp twice daily     No current facility-administered medications on file prior to visit.     Allergies  Allergen Reactions  . Fish Allergy     Throat swell up  . Penicillins     Has patient had a PCN reaction causing immediate rash, facial/tongue/throat swelling, SOB or lightheadedness with hypotension: Yes/No:[id number] unknown Has patient had a PCN reaction causing severe rash involving mucus membranes or skin necrosis: Nounknown Has patient had a PCN reaction that required hospitalization Yes/No:[id number] unknown Has patient had a PCN reaction occurring within the last 10 years: Yes/[id number] If all of the above answers are "NO", then may proceed with C  . Shellfish Allergy     Throat swell up    Social History   Socioeconomic History  . Marital status: Single  Spouse name: Not on file  . Number of children: Not on file  . Years of education: Not on file  . Highest education level: Not on file  Occupational History  . Not on file  Social Needs  . Financial resource strain: Not on file  . Food insecurity    Worry: Not on file    Inability: Not on file  . Transportation needs    Medical: Not on file    Non-medical: Not on file  Tobacco Use  . Smoking status: Never Smoker  . Smokeless tobacco: Never Used  Substance and Sexual Activity  . Alcohol use: No    Alcohol/week: 0.0 standard drinks  . Drug use: No  . Sexual  activity: Not Currently  Lifestyle  . Physical activity    Days per week: Not on file    Minutes per session: Not on file  . Stress: Not on file  Relationships  . Social Herbalist on phone: Not on file    Gets together: Not on file    Attends religious service: Not on file    Active member of club or organization: Not on file    Attends meetings of clubs or organizations: Not on file    Relationship status: Not on file  . Intimate partner violence    Fear of current or ex partner: Not on file    Emotionally abused: Not on file    Physically abused: Not on file    Forced sexual activity: Not on file  Other Topics Concern  . Not on file  Social History Narrative  . Not on file    Family History  Problem Relation Age of Onset  . Diabetes Father   . Hypertension Father   . Asthma Father   . Stroke Maternal Grandmother   . Stroke Maternal Grandfather   . Colon cancer Neg Hx     Past Surgical History:  Procedure Laterality Date  . CATARACT EXTRACTION    . COLONOSCOPY N/A 10/10/2015   Procedure: COLONOSCOPY;  Surgeon: Daneil Dolin, MD;  Location: [facility];  Service: Endoscopy;  Laterality: N/A;  245 - Interpreter scheduled, do NOT move    ROS: Review of Systems Negative except as stated above  PHYSICAL EXAM: BP 126/76   Pulse 73   Temp 99.2 F (37.3 C) (Oral)   Resp 16   Wt 164 lb 6.4 oz (74.6 kg)   SpO2 100%   BMI 32.11 kg/m   Physical Exam  General appearance - alert, well appearing, and in no distress Mental status - normal mood, behavior, speech, dress, motor activity, and thought processes Eyes -nonicteric sclera.  Pink conjunctiva Nose -mild enlargement of nasal turbinates Mouth -no oral lesions.  Throat without erythema or exudates.  No cobblestoning noted at the back of the throat. Neck -supple.  No thyroid enlargement.  No cervical lymphadenopathy. Chest - clear to auscultation, no wheezes, rales or rhonchi, symmetric air entry Heart  - normal rate, regular rhythm, normal S1, S2, no murmurs, rubs, clicks or gallops Extremities - peripheral pulses normal, no pedal edema, no clubbing or cyanosis Skin -3 cm oval second-degree burn noted above the umbilicus.  No surrounding erythema.  No fluctuance.  Slight exudative base   CMP Latest Ref Rng & Units 01/22/2019 12/16/2017 12/12/2016  Glucose 65 - 99 mg/dL 84 94 83  BUN 8 - 27 mg/dL 10 15 10   Creatinine 0.57 - 1.00 mg/dL 0.67 0.67 0.77  Sodium  134 - 144 mmol/L 141 141 140  Potassium 3.5 - 5.2 mmol/L 4.7 4.3 4.5  Chloride 96 - 106 mmol/L 101 100 99  CO2 20 - 29 mmol/L 26 25 26   Calcium 8.7 - 10.3 mg/dL 9.8 9.6 9.9  Total Protein 6.0 - 8.5 g/dL 7.4 7.5 7.6  Total Bilirubin 0.0 - 1.2 mg/dL <0.2 <0.2 <0.2  Alkaline Phos 39 - 117 IU/L 124(H) 109 113  AST 0 - 40 IU/L 14 17 22   ALT 0 - 32 IU/L 12 12 16    Lipid Panel     Component Value Date/Time   CHOL 226 (H) 01/22/2019 1546   TRIG 166 (H) 01/22/2019 1546   HDL 52 01/22/2019 1546   CHOLHDL 4.3 01/22/2019 1546   CHOLHDL 3.8 07/14/2015 0908   VLDL 18 07/14/2015 0908   LDLCALC 144 (H) 01/22/2019 1546    CBC    Component Value Date/Time   WBC 7.5 01/22/2019 1546   WBC 7.1 07/14/2015 0908   RBC 4.39 01/22/2019 1546   RBC 4.88 07/14/2015 0908   HGB 12.4 01/22/2019 1546   HCT 37.6 01/22/2019 1546   PLT 404 01/22/2019 1546   MCV 86 01/22/2019 1546   MCH 28.2 01/22/2019 1546   MCH 28.9 07/14/2015 0908   MCHC 33.0 01/22/2019 1546   MCHC 33.2 07/14/2015 0908   RDW 13.1 01/22/2019 1546   LYMPHSABS 1,988 07/14/2015 0908   MONOABS 497 07/14/2015 0908   EOSABS 142 07/14/2015 0908   BASOSABS 71 07/14/2015 0908    ASSESSMENT AND PLAN: 1. Allergic rhinitis due to other allergic trigger, unspecified seasonality Stop Zyrtec. Start Claritin and Flonase. - loratadine (CLARITIN) 10 MG tablet; Take 1 tablet (10 mg total) by mouth daily.  Dispense: 30 tablet; Refill: 3 - fluticasone (FLONASE) 50 MCG/ACT nasal spray; Place 2  sprays into both nostrils daily as needed for allergies or rhinitis.  Dispense: 16 g; Refill: 2  2. Essential hypertension At goal.  Continue Norvasc - CBC - Comprehensive metabolic panel  3. Hyperlipidemia, unspecified hyperlipidemia type Continue Lipitor. - Lipid panel - atorvastatin (LIPITOR) 20 MG tablet; Take 1 tablet (20 mg total) by mouth daily. Needs appt for more refills.  Dispense: 30 tablet; Refill: 4  4. Acquired hypothyroidism Continue levothyroxine - TSH  5. Sciatica of right side - meloxicam (MOBIC) 15 MG tablet; Take 1 tablet (15 mg total) by mouth daily.  Dispense: 30 tablet; Refill: 3  6. Partial thickness burn of abdominal wall, initial encounter - silver sulfADIAZINE (SILVADENE) 1 % cream; Apply to affected area twice a day to burn area  Dispense: 25 g; Refill: 0  7. Need for influenza vaccination Given  Follow-up in 4 to 6 weeks for Pap smear.  Patient was given the opportunity to ask questions.  Patient verbalized understanding of the plan and was able to repeat key elements of the plan.  [employer] interpreter used during this encounter. [id number]  Orders Placed This Encounter  Procedures  . TSH  . CBC  . Comprehensive metabolic panel  . Lipid panel     Requested Prescriptions   Signed Prescriptions Disp Refills  . meloxicam (MOBIC) 15 MG tablet 30 tablet 3    Sig: Take 1 tablet (15 mg total) by mouth daily.  Marland Kitchen loratadine (CLARITIN) 10 MG tablet 30 tablet 3    Sig: Take 1 tablet (10 mg total) by mouth daily.  . fluticasone (FLONASE) 50 MCG/ACT nasal spray 16 g 2    Sig: Place 2 sprays into both  nostrils daily as needed for allergies or rhinitis.  Marland Kitchen atorvastatin (LIPITOR) 20 MG tablet 30 tablet 4    Sig: Take 1 tablet (20 mg total) by mouth daily. Needs appt for more refills.  . silver sulfADIAZINE (SILVADENE) 1 % cream 25 g 0    Sig: Apply to affected area twice a day to burn area    Return in about 6 weeks (around 03/05/2019) for pap.  Karle Plumber, MD, FACP

## 2019-01-22 NOTE — Progress Notes (Signed)
Patient presents for vaccination against influenza per orders of Dr. Johnson. Consent given. Counseling provided. No contraindications exists. Vaccine administered without incident.   

## 2019-01-23 ENCOUNTER — Ambulatory Visit: Payer: Self-pay | Admitting: Pharmacist

## 2019-01-23 LAB — CBC
Hematocrit: 37.6 % (ref 34.0–46.6)
Hemoglobin: 12.4 g/dL (ref 11.1–15.9)
MCH: 28.2 pg (ref 26.6–33.0)
MCHC: 33 g/dL (ref 31.5–35.7)
MCV: 86 fL (ref 79–97)
Platelets: 404 10*3/uL (ref 150–450)
RBC: 4.39 x10E6/uL (ref 3.77–5.28)
RDW: 13.1 % (ref 11.7–15.4)
WBC: 7.5 10*3/uL (ref 3.4–10.8)

## 2019-01-23 LAB — COMPREHENSIVE METABOLIC PANEL
ALT: 12 IU/L (ref 0–32)
AST: 14 IU/L (ref 0–40)
Albumin/Globulin Ratio: 1.6 (ref 1.2–2.2)
Albumin: 4.6 g/dL (ref 3.8–4.8)
Alkaline Phosphatase: 124 IU/L — ABNORMAL HIGH (ref 39–117)
BUN/Creatinine Ratio: 15 (ref 12–28)
BUN: 10 mg/dL (ref 8–27)
Bilirubin Total: <0.2 mg/dL (ref 0.0–1.2)
CO2: 26 mmol/L (ref 20–29)
Calcium: 9.8 mg/dL (ref 8.7–10.3)
Chloride: 101 mmol/L (ref 96–106)
Creatinine, Ser: 0.67 mg/dL (ref 0.57–1.00)
GFR calc Af Amer: 110 mL/min/{1.73_m2} (ref >59)
GFR calc non Af Amer: 95 mL/min/{1.73_m2} (ref >59)
Globulin, Total: 2.8 g/dL (ref 1.5–4.5)
Glucose: 84 mg/dL (ref 65–99)
Potassium: 4.7 mmol/L (ref 3.5–5.2)
Sodium: 141 mmol/L (ref 134–144)
Total Protein: 7.4 g/dL (ref 6.0–8.5)

## 2019-01-23 LAB — LIPID PANEL
Chol/HDL Ratio: 4.3 ratio (ref 0.0–4.4)
Cholesterol, Total: 226 mg/dL — ABNORMAL HIGH (ref 100–199)
HDL: 52 mg/dL (ref >39)
LDL Chol Calc (NIH): 144 mg/dL — ABNORMAL HIGH (ref 0–99)
Triglycerides: 166 mg/dL — ABNORMAL HIGH (ref 0–149)
VLDL Cholesterol Cal: 30 mg/dL (ref 5–40)

## 2019-01-23 LAB — TSH: TSH: 4.11 u[IU]/mL (ref 0.450–4.500)

## 2019-01-27 ENCOUNTER — Telehealth: Payer: Self-pay

## 2019-01-27 NOTE — Telephone Encounter (Signed)
Nixon  Id# [id number] contacted pt to go over lab results pt didn't answer left a detailed vm informing pt of results and if she has any questions or concerns to give me a call

## 2019-02-04 ENCOUNTER — Ambulatory Visit: Payer: Self-pay | Attending: Family Medicine

## 2019-02-04 ENCOUNTER — Other Ambulatory Visit: Payer: Self-pay

## 2019-02-04 MED FILL — DICLOFENAC SODIUM 1% GEL: 1 | 6 days supply | Qty: 100 | Fill #2

## 2019-02-13 MED FILL — DICLOFENAC SODIUM 1% GEL: 1 | 6 days supply | Qty: 100 | Fill #2

## 2019-02-17 ENCOUNTER — Encounter: Payer: Self-pay | Admitting: Internal Medicine

## 2019-02-23 ENCOUNTER — Other Ambulatory Visit: Payer: Self-pay | Admitting: Internal Medicine

## 2019-02-23 ENCOUNTER — Telehealth: Payer: Self-pay | Admitting: Internal Medicine

## 2019-02-23 DIAGNOSIS — Z1231 Encounter for screening mammogram for malignant neoplasm of breast: Secondary | ICD-10-CM

## 2019-02-23 NOTE — Telephone Encounter (Signed)
Patient called requesting to know the status of her [employer] application. Please f/u

## 2019-02-23 NOTE — Telephone Encounter (Signed)
Pt was informed that was approve for [place] and the letter will be scan on her chart

## 2019-02-24 ENCOUNTER — Ambulatory Visit (HOSPITAL_COMMUNITY): Payer: Self-pay

## 2019-03-06 ENCOUNTER — Encounter: Payer: Self-pay | Admitting: Internal Medicine

## 2019-03-06 ENCOUNTER — Ambulatory Visit: Payer: Self-pay | Attending: Internal Medicine | Admitting: Internal Medicine

## 2019-03-06 ENCOUNTER — Other Ambulatory Visit: Payer: Self-pay

## 2019-03-06 VITALS — BP 130/88 | HR 99 | Temp 98.3°F | Resp 16 | Wt 165.6 lb

## 2019-03-06 DIAGNOSIS — Z1231 Encounter for screening mammogram for malignant neoplasm of breast: Secondary | ICD-10-CM

## 2019-03-06 DIAGNOSIS — Z124 Encounter for screening for malignant neoplasm of cervix: Secondary | ICD-10-CM

## 2019-03-06 DIAGNOSIS — J3089 Other allergic rhinitis: Secondary | ICD-10-CM

## 2019-03-06 MED ORDER — LORATADINE 10 MG PO TABS: 10.0000 mg | ORAL_TABLET | Freq: Every day | ORAL | 3 refills | Status: DC

## 2019-03-06 MED ORDER — ALBUTEROL SULFATE HFA 108 (90 BASE) MCG/ACT IN AERS: 2.0000 | INHALATION_SPRAY | Freq: Four times a day (QID) | RESPIRATORY_TRACT | 12 refills | Status: DC | PRN

## 2019-03-06 MED FILL — !VENTOLIN HFA INHALER: 108 (90 BAS | 25 days supply | Qty: 18 | Fill #0

## 2019-03-06 MED FILL — LEVOTHYROXINE 112 MCG TAB: 112 | 30 days supply | Qty: 30 | Fill #1

## 2019-03-06 NOTE — Progress Notes (Signed)
Patient ID: Alexandra Brewer, female    DOB: 01-31-1956  MRN: [id number]  CC: No chief complaint on file.   Subjective: Alexandra Brewer is a 63 y.o. female who presents for pap smear Her concerns today include:  HTN, thyroid ds, HL, GERD, Frozen shoulder RT  Order placed for MMG 12/2018.  Pt states she had appt but it was cancelled by the breast Ctr.  Now has [facility] discount No abn paps in past No vaginal dischg or itching Sexually active with husband only.. She is post menopausal No fhx of breast, uterine or cervical cancer.  Request RF on Albutterol for asthma and Claritin for allergies.  Patient Active Problem List   Diagnosis Date Noted  . Abdominal pain 03/10/2018  . Sciatica of right side 06/11/2017  . GERD (gastroesophageal reflux disease) 11/26/2016  . Right Achilles tendinitis 08/15/2016  . Hypertension 07/05/2016  . Adhesive capsulitis of right shoulder 02/21/2016  . Heel pain, bilateral 01/10/2016  . 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 History of colonic polyps   . Diverticulosis of colon without hemorrhage   . Hypothyroidism 08/10/2015  . Chronic constipation 08/05/2015  . Hyperlipidemia 08/05/2015     Current Outpatient Medications on File Prior to Visit  Medication Sig Dispense Refill  . albuterol (PROVENTIL HFA;VENTOLIN HFA) 108 (90 Base) MCG/ACT inhaler Inhale 2 puffs into the lungs every 6 (six) hours as needed for wheezing or shortness of breath. 1 Inhaler 0  . amLODipine (NORVASC) 5 MG tablet Take 1 tablet (5 mg total) by mouth daily. 30 tablet 2  . atorvastatin (LIPITOR) 20 MG tablet Take 1 tablet (20 mg total) by mouth daily. Needs appt for more refills. 30 tablet 4  . diclofenac sodium (VOLTAREN) 1 % GEL Apply 4 g topically 4 (four) times daily. For joint pain 1 Tube 6  . fluticasone (FLONASE) 50 MCG/ACT nasal spray Place 2 sprays into both nostrils daily as needed for allergies or rhinitis. 16 g 2  . levothyroxine  (SYNTHROID) 112 MCG tablet Take 1 tablet (112 mcg total) by mouth daily. 30 tablet 2  . loratadine (CLARITIN) 10 MG tablet Take 1 tablet (10 mg total) by mouth daily. 30 tablet 3  . meloxicam (MOBIC) 15 MG tablet Take 1 tablet (15 mg total) by mouth daily. 30 tablet 3  . omeprazole (PRILOSEC) 20 MG capsule Take 1 capsule (20 mg total) by mouth daily. 90 capsule 3  . silver sulfADIAZINE (SILVADENE) 1 % cream Apply to affected area twice a day to burn area 25 g 0  . Wheat Dextrin (BENEFIBER) POWD Take by mouth 2 (two) times daily. Takes 2 tbsp twice daily     No current facility-administered medications on file prior to visit.     Allergies  Allergen Reactions  . Fish Allergy     Throat swell up  . Penicillins     Has patient had a PCN reaction causing immediate rash, facial/tongue/throat swelling, SOB or lightheadedness with hypotension: Yes/[id number] unknown Has patient had a PCN reaction causing severe rash involving mucus membranes or skin necrosis: Yes/[id number] unknown Has patient had a PCN reaction that required hospitalization Yes/No:[id number]}unknown Has patient had a PCN reaction occurring within the last 10 years: Yes/[id number] If all of the above answers are "NO", then may proceed with C  . Shellfish Allergy     Throat swell up    Social History   Socioeconomic History  . Marital status: Single    Spouse name: Not on file  .  Number of children: Not on file  . Years of education: Not on file  . Highest education level: Not on file  Occupational History  . Not on file  Social Needs  . Financial resource strain: Not on file  . Food insecurity    Worry: Not on file    Inability: Not on file  . Transportation needs    Medical: Not on file    Non-medical: Not on file  Tobacco Use  . Smoking status: Never Smoker  . Smokeless tobacco: Never Used  Substance and Sexual Activity  . Alcohol use: No    Alcohol/week: 0.0 standard drinks  . Drug use: No  . Sexual  activity: Not Currently  Lifestyle  . Physical activity    Days per week: Not on file    Minutes per session: Not on file  . Stress: Not on file  Relationships  . Social Herbalist on phone: Not on file    Gets together: Not on file    Attends religious service: Not on file    Active member of club or organization: Not on file    Attends meetings of clubs or organizations: Not on file    Relationship status: Not on file  . Intimate partner violence    Fear of current or ex partner: Not on file    Emotionally abused: Not on file    Physically abused: Not on file    Forced sexual activity: Not on file  Other Topics Concern  . Not on file  Social History Narrative  . Not on file    Family History  Problem Relation Age of Onset  . Diabetes Father   . Hypertension Father   . Asthma Father   . Stroke Maternal Grandmother   . Stroke Maternal Grandfather   . Colon cancer Neg Hx     Past Surgical History:  Procedure Laterality Date  . CATARACT EXTRACTION    . COLONOSCOPY N/A 10/10/2015   Procedure: COLONOSCOPY;  Surgeon: Daneil Dolin, MD;  Location[facility];  Service: Endoscopy;  Laterality: N/A;  245 - Interpreter scheduled, do NOT move    ROS: Review of Systems Negative except as stated above  PHYSICAL EXAM: BP 130/88   Pulse 99   Temp 98.3 F (36.8 C) (Oral)   Resp 16   Wt 165 lb 9.6 oz (75.1 kg)   SpO2 97%   BMI 32.34 kg/m   Physical Exam  General appearance - alert, well appearing, and in no distress Mental status - normal mood, behavior, speech, dress, motor activity, and thought processes Breasts - breasts appear normal, no suspicious masses, no skin or nipple changes or axillary nodes Pelvic -CMA Pollock present during exam: Normal external genitalia, vulva, vagina, cervix, uterus and adnexa   CMP Latest Ref Rng & Units 01/22/2019 12/16/2017 12/12/2016  Glucose 65 - 99 mg/dL 84 94 83  BUN 8 - 27 mg/dL 10 15 10   Creatinine 0.57 - 1.00  mg/dL 0.67 0.67 0.77  Sodium 134 - 144 mmol/L 141 141 140  Potassium 3.5 - 5.2 mmol/L 4.7 4.3 4.5  Chloride 96 - 106 mmol/L 101 100 99  CO2 20 - 29 mmol/L 26 25 26   Calcium 8.7 - 10.3 mg/dL 9.8 9.6 9.9  Total Protein 6.0 - 8.5 g/dL 7.4 7.5 7.6  Total Bilirubin 0.0 - 1.2 mg/dL <0.2 <0.2 <0.2  Alkaline Phos 39 - 117 IU/L 124(H) 109 113  AST 0 - 40 IU/L  14 17 22   ALT 0 - 32 IU/L 12 12 16    Lipid Panel     Component Value Date/Time   CHOL 226 (H) 01/22/2019 1546   TRIG 166 (H) 01/22/2019 1546   HDL 52 01/22/2019 1546   CHOLHDL 4.3 01/22/2019 1546   CHOLHDL 3.8 07/14/2015 0908   VLDL 18 07/14/2015 0908   LDLCALC 144 (H) 01/22/2019 1546    CBC    Component Value Date/Time   WBC 7.5 01/22/2019 1546   WBC 7.1 07/14/2015 0908   RBC 4.39 01/22/2019 1546   RBC 4.88 07/14/2015 0908   HGB 12.4 01/22/2019 1546   HCT 37.6 01/22/2019 1546   PLT 404 01/22/2019 1546   MCV 86 01/22/2019 1546   MCH 28.2 01/22/2019 1546   MCH 28.9 07/14/2015 0908   MCHC 33.0 01/22/2019 1546   MCHC 33.2 07/14/2015 0908   RDW 13.1 01/22/2019 1546   LYMPHSABS 1,988 07/14/2015 0908   MONOABS 497 07/14/2015 0908   EOSABS 142 07/14/2015 0908   BASOSABS 71 07/14/2015 0908    ASSESSMENT AND PLAN: 1. Pap smear for cervical cancer screening - Cytology - PAP([place])  2. Encounter for screening mammogram for malignant neoplasm of breast - [facility] Digital Screening; Future  3. Allergic rhinitis due to other allergic trigger, unspecified seasonality - loratadine (CLARITIN) 10 MG tablet; Take 1 tablet (10 mg total) by mouth daily.  Dispense: 30 tablet; Refill: 3    Patient was given the opportunity to ask questions.  Patient verbalized understanding of the plan and was able to repeat key elements of the plan.  [employer] interpreter used during this encounter. [id number]  No orders of the defined types were placed in this encounter.    Requested Prescriptions    No prescriptions requested or ordered in this  encounter    No follow-ups on file.  Karle Plumber, MD, FACP

## 2019-03-09 LAB — CYTOLOGY - PAP
Comment: NEGATIVE
Diagnosis: NEGATIVE
High risk HPV: NEGATIVE

## 2019-03-13 MED FILL — ?AMLODIPINE BESYLATE 5 MG T: 5 MG | 30 days supply | Qty: 30 | Fill #1

## 2019-03-13 MED FILL — FLUTICASONE PROP 50 MCG SPR: 50 | 30 days supply | Qty: 16 | Fill #1

## 2019-03-19 ENCOUNTER — Ambulatory Visit (INDEPENDENT_AMBULATORY_CARE_PROVIDER_SITE_OTHER): Payer: Self-pay | Admitting: Orthopaedic Surgery

## 2019-03-19 ENCOUNTER — Other Ambulatory Visit: Payer: Self-pay

## 2019-03-19 DIAGNOSIS — M25462 Effusion, left knee: Secondary | ICD-10-CM

## 2019-03-19 DIAGNOSIS — M25562 Pain in left knee: Secondary | ICD-10-CM

## 2019-03-19 DIAGNOSIS — G8929 Other chronic pain: Secondary | ICD-10-CM

## 2019-03-19 MED ORDER — METHYLPREDNISOLONE ACETATE 40 MG/ML IJ SUSP
40.0000 mg | INTRAMUSCULAR | Status: AC | PRN
  Administered 2019-03-19: 40 mg via INTRA_ARTICULAR

## 2019-03-19 MED ORDER — LIDOCAINE HCL 1 % IJ SOLN
3.0000 mL | INTRAMUSCULAR | Status: AC | PRN
  Administered 2019-03-19: 3 mL

## 2019-03-19 NOTE — Progress Notes (Signed)
Office Visit Note   Patient: Alexandra Brewer           Date of Birth: 05-06-1955           MRN: [id number]  Visit Date: 03/19/2019              Requested by: Ladell Pier, MD [address] [place],  [place] [address] PCP: Ladell Pier, MD   Assessment & Plan: Visit Diagnoses:  1. Chronic pain of left knee   2. Effusion, left knee     Plan: I did aspirate about 20 cc of fluid from her left knee that was clear.  I placed a steroid injection in her knee then.  At this point given the failed conservative treatment for over 6 months a MRI is warranted to rule out a meniscal tear of her left knee.  This was all communicated through the interpreter and she states that she understands this.  She did feel better after the injection but given her recurrent effusions and continued pain at this point a MRI is warranted.  We will see her back after the MRI.  Follow-Up Instructions: Return in about 4 weeks (around 04/16/2019).   Orders:  No orders of the defined types were placed in this encounter.  No orders of the defined types were placed in this encounter.     Procedures: Large Joint Inj: L knee on 03/19/2019 3:39 PM Indications: diagnostic evaluation and pain Details: 22 G 1.5 in needle, superolateral approach  Arthrogram: No  Medications: 3 mL lidocaine 1 %; 40 mg methylPREDNISolone acetate 40 MG/ML Outcome: tolerated well, no immediate complications Procedure, treatment alternatives, risks and benefits explained, specific risks discussed. Consent was given by the patient. Immediately prior to procedure a time out was called to verify the correct patient, procedure, equipment, support staff and site/side marked as required. Patient was prepped and draped in the usual sterile fashion.       Clinical Data: No additional findings.   Subjective: Chief Complaint  Patient presents with  . Left Knee - Pain  The patient is someone of seen before.  She is a  63 year old Hispanic female who has been dealing with left knee pain for some time now.  We saw her back in June of this year.  X-rays of the knee showed just some slight medial joint space narrowing and some slight patellofemoral arthritic changes.  We placed a steroid injection in her knee then and wanted to see her back in 3 weeks but that may have been lost in communication.  She does have an interpreter with her today.  She has had increased swelling in her left knee and pain.  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  She denies any calf pain.  She denies any recent injuries.  She states that the injection I did provide helped some back in June of this year.  HPI  Review of Systems She currently denies any headache, chest pain, shortness of breath, fever, chills, nausea, vomiting  Objective: Vital Signs: There were no vitals taken for this visit.  Physical Exam She is alert and orient x3 and in no acute distress Ortho Exam Examination of her left knee does show a mild effusion.  She has slight varus malalignment.  There is patellofemoral crepitation.  The knee has full range of motion and is ligamentously stable.  There is medial joint line tenderness.  She seems to not tolerate a McMurray's exam to  the medial compartment. Specialty Comments:  No specialty comments available.  Imaging: No results found.   PMFS History: Patient Active Problem List   Diagnosis Date Noted  . Abdominal pain 03/10/2018  . Sciatica of right side 06/11/2017  . GERD (gastroesophageal reflux disease) 11/26/2016  . Right Achilles tendinitis 08/15/2016  . Hypertension 07/05/2016  . Adhesive capsulitis of right shoulder 02/21/2016  . Heel pain, bilateral 01/10/2016  . 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 History of colonic polyps   . Diverticulosis of colon without hemorrhage   . Hypothyroidism 08/10/2015  . Chronic constipation 08/05/2015  . Hyperlipidemia  08/05/2015   Past Medical History:  Diagnosis Date  . Asthma   . Constipation   . Hyperlipemia   . Sciatica   . Thyroid disease     Family History  Problem Relation Age of Onset  . Diabetes Father   . Hypertension Father   . Asthma Father   . Stroke Maternal Grandmother   . Stroke Maternal Grandfather   . Colon cancer Neg Hx     Past Surgical History:  Procedure Laterality Date  . CATARACT EXTRACTION    . COLONOSCOPY N/A 10/10/2015   Procedure: COLONOSCOPY;  Surgeon: Daneil Dolin, MD;  [facility];  Service: Endoscopy;  Laterality: N/A;  245 - Interpreter scheduled, do NOT move   Social History   Occupational History  . Not on file  Tobacco Use  . Smoking status: Never Smoker  . Smokeless tobacco: Never Used  Substance and Sexual Activity  . Alcohol use: No    Alcohol/week: 0.0 standard drinks  . Drug use: No  . Sexual activity: Not Currently

## 2019-03-23 ENCOUNTER — Other Ambulatory Visit: Payer: Self-pay

## 2019-03-23 ENCOUNTER — Telehealth: Payer: Self-pay

## 2019-03-23 DIAGNOSIS — M25462 Effusion, left knee: Secondary | ICD-10-CM

## 2019-03-23 DIAGNOSIS — M25562 Pain in left knee: Secondary | ICD-10-CM

## 2019-03-23 DIAGNOSIS — G8929 Other chronic pain: Secondary | ICD-10-CM

## 2019-03-23 NOTE — Telephone Encounter (Signed)
Error

## 2019-04-16 ENCOUNTER — Ambulatory Visit: Payer: Self-pay | Admitting: Orthopaedic Surgery

## 2019-04-17 MED FILL — DICLOFENAC SODIUM 1% GEL: 1 | 6 days supply | Qty: 100 | Fill #3

## 2019-04-17 MED FILL — FLUTICASONE PROP 50 MCG SPR: 50 | 30 days supply | Qty: 16 | Fill #2

## 2019-04-17 MED FILL — AMLODIPINE BESYLATE 5 MG TA: 5 | 30 days supply | Qty: 30 | Fill #2

## 2019-04-30 ENCOUNTER — Ambulatory Visit
Admission: RE | Admit: 2019-04-30 | Discharge: 2019-04-30 | Disposition: A | Discharge status: Home / Self Care | Payer: No Typology Code available for payment source | Source: Ambulatory Visit | Attending: Internal Medicine | Admitting: Internal Medicine

## 2019-04-30 ENCOUNTER — Other Ambulatory Visit: Payer: Self-pay

## 2019-04-30 DIAGNOSIS — Z1231 Encounter for screening mammogram for malignant neoplasm of breast: Secondary | ICD-10-CM

## 2019-05-01 ENCOUNTER — Other Ambulatory Visit: Payer: Self-pay

## 2019-05-01 ENCOUNTER — Ambulatory Visit
Admission: RE | Admit: 2019-05-01 | Discharge: 2019-05-01 | Disposition: A | Discharge status: Home / Self Care | Payer: No Typology Code available for payment source | Source: Ambulatory Visit | Attending: Orthopaedic Surgery | Admitting: Orthopaedic Surgery

## 2019-05-01 DIAGNOSIS — M25462 Effusion, left knee: Secondary | ICD-10-CM

## 2019-05-01 DIAGNOSIS — G8929 Other chronic pain: Secondary | ICD-10-CM

## 2019-05-05 ENCOUNTER — Telehealth: Payer: Self-pay

## 2019-05-05 NOTE — Telephone Encounter (Signed)
[employer] interpreters Izora Gala  Id# [phone]  contacted pt to go over MM results pt didn't answer left a detailed vm informing pt of results and if she has any questions or concerns to give a call

## 2019-05-11 ENCOUNTER — Ambulatory Visit: Payer: No Typology Code available for payment source | Admitting: Orthopaedic Surgery

## 2019-05-18 ENCOUNTER — Other Ambulatory Visit: Payer: Self-pay

## 2019-05-18 ENCOUNTER — Encounter: Payer: Self-pay | Admitting: Orthopaedic Surgery

## 2019-05-18 ENCOUNTER — Ambulatory Visit (INDEPENDENT_AMBULATORY_CARE_PROVIDER_SITE_OTHER): Payer: Self-pay | Admitting: Orthopaedic Surgery

## 2019-05-18 ENCOUNTER — Other Ambulatory Visit: Payer: Self-pay | Admitting: Internal Medicine

## 2019-05-18 DIAGNOSIS — I10 Essential (primary) hypertension: Secondary | ICD-10-CM

## 2019-05-18 DIAGNOSIS — M25562 Pain in left knee: Secondary | ICD-10-CM

## 2019-05-18 DIAGNOSIS — M25462 Effusion, left knee: Secondary | ICD-10-CM

## 2019-05-18 DIAGNOSIS — E039 Hypothyroidism, unspecified: Secondary | ICD-10-CM

## 2019-05-18 DIAGNOSIS — G8929 Other chronic pain: Secondary | ICD-10-CM

## 2019-05-18 MED ORDER — LEVOTHYROXINE SODIUM 112 MCG PO TABS: 112.0000 ug | ORAL_TABLET | Freq: Every day | ORAL | 2 refills | Status: DC

## 2019-05-18 MED FILL — ?LEVOTHYROXINE 112 MCG TAB: 112 | 30 days supply | Qty: 30 | Fill #2

## 2019-05-18 MED FILL — DICLOFENAC SODIUM 1% GEL: 1 | 6 days supply | Qty: 100 | Fill #4

## 2019-05-18 MED FILL — AMLODIPINE BESYLATE 5 MG TA: 5 | 30 days supply | Qty: 30 | Fill #0

## 2019-05-18 NOTE — Addendum Note (Signed)
Addended by: Daisy Blossom, Annie Main L on: 05/18/2019 04:23 PM   Modules accepted: Orders

## 2019-05-18 NOTE — Progress Notes (Signed)
The patient comes in today with an interpreter to go over the MRI of her left knee.  I had seen her in July of last year and then again in December with recurrent effusion of her left knee and medial left knee pain.  She has had at least 2 steroid injections in her knee with the last one being in just December.  She says that last injection has helped and she is doing better overall.  On examination of her left knee today there is some slight medial joint line tenderness and some patellofemoral crepitation but a negative McMurray's and Lachman's exam.  There is no effusion today.  The MRIs reviewed with her and it does show thinning of the articular cartilage in the medial compartment of the knee.  There is a very small tear of the root of the meniscus but this is not symptomatic for her.  At this point I recommended quad strengthening exercises and weight loss.  Have also recommended Voltaren gel.  We cannot order hyaluronic acid since she does not have any health insurance.  We can always repeat a steroid injection in a month or 2 if she develops pain or recurrent effusion again.  Thus far I would not recommend any arthroscopic intervention unless she develops mechanical symptoms.  This was all explained through the interpreter and she was able to demonstrate back quad strengthening exercises.  All question concerns were answered and addressed.

## 2019-05-18 NOTE — Telephone Encounter (Signed)
1) Medication(s) Requested (by name): -levothyroxine (SYNTHROID) 112 MCG tablet  -amLODipine (NORVASC) 5 MG tablet   2) Pharmacy of Choice: -[facility], [place] [address]   3) Special Requests: Pt has an appt 07/06/2019

## 2019-06-02 NOTE — Progress Notes (Signed)
Patient ID: Alexandra Brewer, female   DOB: 1955/12/15, 65 y.o.   MRN: [id number] Virtual Visit via Telephone Note  I connected with [name] on 06/03/19 at  3:50 PM EST by telephone and verified that I am speaking with the correct person using two identifiers.   I discussed the limitations, risks, security and privacy concerns of performing an evaluation and management service by telephone and the availability of in person appointments. I also discussed with the patient that there may be a patient responsible charge related to this service. The patient expressed understanding and agreed to proceed.  PATIENT visit by telephone virtually in the context of Covid-19 pandemic. Patient location:  home My Location:  [facility] office Persons on the call:  Me and the patient and interpreter.     History of Present Illness: After ED visit 05/25/2019.  C/o R eye twitching.  No pain.  No redness.  OTC systane drops not helping.  No discharge from the eye.  About 8 years ago she had cataract surgery.  No vision changes.    From ED note: Impression: Alexandra Brewer is a 64 y.o. female with a PMH ofarthritis, asthma, HTN, GERD, HLD, hypothyroidism, and R sided cataract s/p surgical repair (2012) presenting to the ED to request an ophthalmology referral due to right eye dryness for several months.   On exam, patient is nontoxic appearing and in NAD. Vital signs are within normal limits. Conjunctiva clear bilaterally. PERRL. No signs of infection. Vision grossly intact. Heart rate normal and rhythm regular.   11:12 AM Eyes well appearing, no signs of conjunctivitis, pupils equally reactive. Patient requesting ophthalmology referral. Will discharge patient with referral to ophthalmology and PCP follow-up. PHS information provided. Strict return precautions have been discussed. Pt is agreeable with this plan and expresses understanding.  rf   Observations/Objective: NAD.  A&Ox3  Assessment and  Plan: 1. Eye twitch No red flags today - Ambulatory referral to Ophthalmology  2. Encounter for examination following treatment at hospital   Follow Up Instructions: Keep appt with Dr Wynetta Emery for July 06, 2019   I discussed the assessment and treatment plan with the patient. The patient was provided an opportunity to ask questions and all were answered. The patient agreed with the plan and demonstrated an understanding of the instructions.   The patient was advised to call back or seek an in-person evaluation if the symptoms worsen or if the condition fails to improve as anticipated.  I provided 9 minutes of non-face-to-face time during this encounter.   Alexandra Caldron, PA-C

## 2019-06-03 ENCOUNTER — Ambulatory Visit: Payer: Self-pay | Attending: Internal Medicine | Admitting: Physician Assistant

## 2019-06-03 ENCOUNTER — Other Ambulatory Visit: Payer: Self-pay

## 2019-06-03 DIAGNOSIS — G245 Blepharospasm: Secondary | ICD-10-CM

## 2019-06-03 DIAGNOSIS — Z09 Encounter for follow-up examination after completed treatment for conditions other than malignant neoplasm: Secondary | ICD-10-CM

## 2019-06-03 NOTE — Progress Notes (Signed)
  DOB and name  [facility] F /u  C /o R eye twitching  X 1 month/ Had cataract surgery 8 yrs ago on the same eye  Denies any discharge from the eye

## 2019-06-10 MED FILL — AMLODIPINE BESYLATE 5 MG TA: 5 | 30 days supply | Qty: 30 | Fill #1

## 2019-06-18 ENCOUNTER — Other Ambulatory Visit: Payer: No Typology Code available for payment source

## 2019-07-06 ENCOUNTER — Other Ambulatory Visit: Payer: Self-pay

## 2019-07-06 ENCOUNTER — Ambulatory Visit: Payer: Self-pay | Attending: Internal Medicine | Admitting: Internal Medicine

## 2019-07-06 ENCOUNTER — Encounter: Payer: Self-pay | Admitting: Internal Medicine

## 2019-07-06 ENCOUNTER — Other Ambulatory Visit: Payer: Self-pay | Admitting: Internal Medicine

## 2019-07-06 VITALS — BP 117/79 | HR 78 | Temp 98.6°F | Resp 16 | Wt 166.0 lb

## 2019-07-06 DIAGNOSIS — E785 Hyperlipidemia, unspecified: Secondary | ICD-10-CM

## 2019-07-06 DIAGNOSIS — J301 Allergic rhinitis due to pollen: Secondary | ICD-10-CM

## 2019-07-06 DIAGNOSIS — H6120 Impacted cerumen, unspecified ear: Secondary | ICD-10-CM

## 2019-07-06 DIAGNOSIS — E039 Hypothyroidism, unspecified: Secondary | ICD-10-CM

## 2019-07-06 DIAGNOSIS — I10 Essential (primary) hypertension: Secondary | ICD-10-CM

## 2019-07-06 MED ORDER — FLUTICASONE PROPIONATE 50 MCG/ACT NA SUSP: 2.0000 | Freq: Every day | NASAL | 6 refills | Status: DC | PRN

## 2019-07-06 MED ORDER — AMLODIPINE BESYLATE 5 MG PO TABS: 5.0000 mg | ORAL_TABLET | Freq: Every day | ORAL | 6 refills | Status: DC

## 2019-07-06 MED ORDER — LEVOTHYROXINE SODIUM 112 MCG PO TABS: 112.0000 ug | ORAL_TABLET | Freq: Every day | ORAL | 6 refills | Status: DC

## 2019-07-06 MED FILL — AMLODIPINE BESYLATE 5 MG TA: 5 | 30 days supply | Qty: 30 | Fill #0

## 2019-07-06 MED FILL — ?LEVOTHYROXINE 112 MCG TAB: 112 | 30 days supply | Qty: 30 | Fill #0

## 2019-07-06 MED FILL — FLUTICASONE PROP 50 MCG SPR: 50 | 30 days supply | Qty: 16 | Fill #0

## 2019-07-06 NOTE — Progress Notes (Signed)
Pt states she is having discomfort in her right ear. States her ear pops a lot

## 2019-07-06 NOTE — Progress Notes (Signed)
Patient ID: Alexandra Brewer, female    DOB: Sep 29, 1955  MRN: [id number]  CC: Hypertension   Subjective: Alexandra Brewer is a 64 y.o. female who presents for chronic ds management Her concerns today include:  HTN, thyroid ds, HL, GERD, Frozen shoulder RT  Pt c/o problems in RT ear x 1 mth -pain intermittent.  Feels blocked and congested No problem hearing.  No grainage She has been putting oxygenated water in the ear.  HTN: compliant with Norvasc and salt restriction No CP/SOB/LE edema  Hypothyroid: needs RF on Levothyroxine 112 mcg Taking med consistently Has been feeling tired.  C/o watery, itchy eyes.  Also endorse sneezing and itchy throat.  Request RF on Flonase.  Has Claritin at home which she uses sometimes  HL: reports compliance with Lipitor  Wants to know if there may be any interaction b/w the meds she is on and COVID-19 vaccine Patient Active Problem List   Diagnosis Date Noted  . Abdominal pain 03/10/2018  . Sciatica of right side 06/11/2017  . GERD (gastroesophageal reflux disease) 11/26/2016  . Right Achilles tendinitis 08/15/2016  . Hypertension 07/05/2016  . Adhesive capsulitis of right shoulder 02/21/2016  . Heel pain, bilateral 01/10/2016  . 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 History of colonic polyps   . Diverticulosis of colon without hemorrhage   . Hypothyroidism 08/10/2015  . Chronic constipation 08/05/2015  . Hyperlipidemia 08/05/2015     Current Outpatient Medications on File Prior to Visit  Medication Sig Dispense Refill  . albuterol (VENTOLIN HFA) 108 (90 Base) MCG/ACT inhaler Inhale 2 puffs into the lungs every 6 (six) hours as needed for wheezing or shortness of breath. (Patient not taking: Reported on 06/03/2019) 8 g 12  . amLODipine (NORVASC) 5 MG tablet TAKE 1 TABLET (5 MG TOTAL) BY MOUTH DAILY. 30 tablet 2  . atorvastatin (LIPITOR) 20 MG tablet Take 1 tablet (20 mg total) by mouth daily. Needs appt  for more refills. 30 tablet 4  . diclofenac sodium (VOLTAREN) 1 % GEL Apply 4 g topically 4 (four) times daily. For joint pain 1 Tube 6  . fluticasone (FLONASE) 50 MCG/ACT nasal spray Place 2 sprays into both nostrils daily as needed for allergies or rhinitis. 16 g 2  . levothyroxine (SYNTHROID) 112 MCG tablet Take 1 tablet (112 mcg total) by mouth daily. 30 tablet 2  . loratadine (CLARITIN) 10 MG tablet Take 1 tablet (10 mg total) by mouth daily. (Patient not taking: Reported on 06/03/2019) 30 tablet 3  . meloxicam (MOBIC) 15 MG tablet Take 1 tablet (15 mg total) by mouth daily. (Patient not taking: Reported on 06/03/2019) 30 tablet 3  . omeprazole (PRILOSEC) 20 MG capsule Take 1 capsule (20 mg total) by mouth daily. 90 capsule 3  . silver sulfADIAZINE (SILVADENE) 1 % cream Apply to affected area twice a day to burn area 25 g 0  . Wheat Dextrin (BENEFIBER) POWD Take by mouth 2 (two) times daily. Takes 2 tbsp twice daily     No current facility-administered medications on file prior to visit.    Allergies  Allergen Reactions  . Fish Allergy     Throat swell up  . Penicillins     Has patient had a PCN reaction causing immediate rash, facial/tongue/throat swelling, SOB or lightheadedness with hypotension: Yes/[id number] unknown Has patient had a PCN reaction causing severe rash involving mucus membranes or skin necrosis: Yes/[id number] unknown Has patient had a PCN reaction that required hospitalization  [id number] unknown Has patient had a PCN reaction occurring within the last 10 years: Yes/No:[id number] If all of the above answers are "NO", then may proceed with C  . Shellfish Allergy     Throat swell up    Social History   Socioeconomic History  . Marital status: Single    Spouse name: Not on file  . Number of children: Not on file  . Years of education: Not on file  . Highest education level: Not on file  Occupational History  . Not on file  Tobacco Use  . Smoking status: Never  Smoker  . Smokeless tobacco: Never Used  Substance and Sexual Activity  . Alcohol use: No    Alcohol/week: 0.0 standard drinks  . Drug use: No  . Sexual activity: Not Currently  Other Topics Concern  . Not on file  Social History Narrative  . Not on file   Social Determinants of Health   Financial Resource Strain:   . Difficulty of Paying Living Expenses:   Food Insecurity:   . Worried About Charity fundraiser in the Last Year:   . Arboriculturist in the Last Year:   Transportation Needs:   . Film/video editor (Medical):   Marland Kitchen Lack of Transportation (Non-Medical):   Physical Activity:   . Days of Exercise per Week:   . Minutes of Exercise per Session:   Stress:   . Feeling of Stress :   Social Connections:   . Frequency of Communication with Friends and Family:   . Frequency of Social Gatherings with Friends and Family:   . Attends Religious Services:   . Active Member of Clubs or Organizations:   . Attends Archivist Meetings:   Marland Kitchen Marital Status:   Intimate Partner Violence:   . Fear of Current or Ex-Partner:   . Emotionally Abused:   Marland Kitchen Physically Abused:   . Sexually Abused:     Family History  Problem Relation Age of Onset  . Diabetes Father   . Hypertension Father   . Asthma Father   . Stroke Maternal Grandmother   . Stroke Maternal Grandfather   . Colon cancer Neg Hx     Past Surgical History:  Procedure Laterality Date  . CATARACT EXTRACTION    . COLONOSCOPY N/A 10/10/2015   Procedure: COLONOSCOPY;  Surgeon: Daneil Dolin, MD;  [facility];  Service: Endoscopy;  Laterality: N/A;  245 - Interpreter scheduled, do NOT move    ROS: Review of Systems Negative except as stated above  PHYSICAL EXAM: BP 117/79   Pulse 78   Temp 98.6 F (37 C)   Resp 16   Wt 166 lb (75.3 kg)   SpO2 98%   BMI 32.42 kg/m   Wt Readings from Last 3 Encounters:  07/06/19 166 lb (75.3 kg)  03/06/19 165 lb 9.6 oz (75.1 kg)  01/22/19 164 lb 6.4  oz (74.6 kg)    Physical Exam  General appearance - alert, well appearing, and in no distress Mental status - normal mood, behavior, speech, dress, motor activity, and thought processes Eyes - pupils equal and reactive, extraocular eye movements intact Ears -she has mild amount of wax buildup in the right ear canal superiorly but not blocking the canal obscuring view of the tympanic membrane.  Tympanic membrane within normal limits.  Canal and membrane on the left side within normal limits.  Mouth - mucous membranes moist, pharynx normal without lesions Neck - supple, no  significant adenopathy Chest - clear to auscultation, no wheezes, rales or rhonchi, symmetric air entry Heart - normal rate, regular rhythm, normal S1, S2, no murmurs, rubs, clicks or gallops Extremities - peripheral pulses normal, no pedal edema, no clubbing or cyanosis   CMP Latest Ref Rng & Units 01/22/2019 12/16/2017 12/12/2016  Glucose 65 - 99 mg/dL 84 94 83  BUN 8 - 27 mg/dL 10 15 10   Creatinine 0.57 - 1.00 mg/dL 0.67 0.67 0.77  Sodium 134 - 144 mmol/L 141 141 140  Potassium 3.5 - 5.2 mmol/L 4.7 4.3 4.5  Chloride 96 - 106 mmol/L 101 100 99  CO2 20 - 29 mmol/L 26 25 26   Calcium 8.7 - 10.3 mg/dL 9.8 9.6 9.9  Total Protein 6.0 - 8.5 g/dL 7.4 7.5 7.6  Total Bilirubin 0.0 - 1.2 mg/dL <0.2 <0.2 <0.2  Alkaline Phos 39 - 117 IU/L 124(H) 109 113  AST 0 - 40 IU/L 14 17 22   ALT 0 - 32 IU/L 12 12 16    Lipid Panel     Component Value Date/Time   CHOL 226 (H) 01/22/2019 1546   TRIG 166 (H) 01/22/2019 1546   HDL 52 01/22/2019 1546   CHOLHDL 4.3 01/22/2019 1546   CHOLHDL 3.8 07/14/2015 0908   VLDL 18 07/14/2015 0908   LDLCALC 144 (H) 01/22/2019 1546    CBC    Component Value Date/Time   WBC 7.5 01/22/2019 1546   WBC 7.1 07/14/2015 0908   RBC 4.39 01/22/2019 1546   RBC 4.88 07/14/2015 0908   HGB 12.4 01/22/2019 1546   HCT 37.6 01/22/2019 1546   PLT 404 01/22/2019 1546   MCV 86 01/22/2019 1546   MCH 28.2  01/22/2019 1546   MCH 28.9 07/14/2015 0908   MCHC 33.0 01/22/2019 1546   MCHC 33.2 07/14/2015 0908   RDW 13.1 01/22/2019 1546   LYMPHSABS 1,988 07/14/2015 0908   MONOABS 497 07/14/2015 0908   EOSABS 142 07/14/2015 0908   BASOSABS 71 07/14/2015 0908    ASSESSMENT AND PLAN: 1. Essential hypertension At goal.  Continue amlodipine - amLODipine (NORVASC) 5 MG tablet; Take 1 tablet (5 mg total) by mouth daily.  Dispense: 30 tablet; Refill: 6  2. Acquired hypothyroidism Continue levothyroxine. - levothyroxine (SYNTHROID) 112 MCG tablet; Take 1 tablet (112 mcg total) by mouth daily.  Dispense: 30 tablet; Refill: 6 - TSH  3. Seasonal allergic rhinitis due to pollen Recommend continuing Flonase and using it in combination with Claritin. - fluticasone (FLONASE) 50 MCG/ACT nasal spray; Place 2 sprays into both nostrils daily as needed for allergies or rhinitis.  Dispense: 16 g; Refill: 6  4. Cerumen in auditory canal on examination She can use some wax softener which can be purchased over-the-counter  5. Hyperlipidemia, unspecified hyperlipidemia type Continue atorvastatin  Advised patient that there should not be any interaction between the COVID-19 vaccine and her current medications.     Patient was given the opportunity to ask questions.  Patient verbalized understanding of the plan and was able to repeat key elements of the plan.  [employer] Language services used during this encounter.[id number]  No orders of the defined types were placed in this encounter.    Requested Prescriptions    No prescriptions requested or ordered in this encounter    No follow-ups on file.  Karle Plumber, MD, FACP

## 2019-07-07 LAB — TSH: TSH: 4.52 u[IU]/mL — ABNORMAL HIGH (ref 0.450–4.500)

## 2019-07-08 ENCOUNTER — Telehealth: Payer: Self-pay

## 2019-07-08 NOTE — Telephone Encounter (Signed)
[employer] Jenny Reichmann  Id# [id number] contacted pt to go over lab results pt didn't answer left a detailed vm informing pt of results and if she has any questions or concerns to give a call

## 2019-08-03 ENCOUNTER — Ambulatory Visit: Payer: Self-pay

## 2019-08-03 ENCOUNTER — Other Ambulatory Visit: Payer: Self-pay

## 2019-08-03 MED FILL — LEVOTHYROXINE 112 MCG TAB: 112 | 30 days supply | Qty: 30 | Fill #1

## 2019-08-25 ENCOUNTER — Ambulatory Visit (HOSPITAL_COMMUNITY)
Admission: EM | Admit: 2019-08-25 | Discharge: 2019-08-25 | Disposition: A | Discharge status: Home / Self Care | Payer: HRSA Program | Attending: Physician Assistant | Admitting: Physician Assistant

## 2019-08-25 ENCOUNTER — Other Ambulatory Visit: Payer: Self-pay

## 2019-08-25 ENCOUNTER — Encounter (HOSPITAL_COMMUNITY): Payer: Self-pay

## 2019-08-25 DIAGNOSIS — Z886 Allergy status to analgesic agent status: Secondary | ICD-10-CM | POA: Insufficient documentation

## 2019-08-25 DIAGNOSIS — Z791 Long term (current) use of non-steroidal anti-inflammatories (NSAID): Secondary | ICD-10-CM | POA: Diagnosis not present

## 2019-08-25 DIAGNOSIS — Z8249 Family history of ischemic heart disease and other diseases of the circulatory system: Secondary | ICD-10-CM | POA: Insufficient documentation

## 2019-08-25 DIAGNOSIS — Z880 Allergy status to penicillin: Secondary | ICD-10-CM | POA: Diagnosis not present

## 2019-08-25 DIAGNOSIS — Z79890 Hormone replacement therapy (postmenopausal): Secondary | ICD-10-CM | POA: Insufficient documentation

## 2019-08-25 DIAGNOSIS — Z79899 Other long term (current) drug therapy: Secondary | ICD-10-CM | POA: Insufficient documentation

## 2019-08-25 DIAGNOSIS — Z8719 Personal history of other diseases of the digestive system: Secondary | ICD-10-CM | POA: Insufficient documentation

## 2019-08-25 DIAGNOSIS — Z20822 Contact with and (suspected) exposure to covid-19: Secondary | ICD-10-CM | POA: Diagnosis not present

## 2019-08-25 DIAGNOSIS — J069 Acute upper respiratory infection, unspecified: Secondary | ICD-10-CM | POA: Diagnosis not present

## 2019-08-25 DIAGNOSIS — M7661 Achilles tendinitis, right leg: Secondary | ICD-10-CM | POA: Insufficient documentation

## 2019-08-25 DIAGNOSIS — Z7952 Long term (current) use of systemic steroids: Secondary | ICD-10-CM | POA: Diagnosis not present

## 2019-08-25 DIAGNOSIS — K5909 Other constipation: Secondary | ICD-10-CM | POA: Insufficient documentation

## 2019-08-25 DIAGNOSIS — J45901 Unspecified asthma with (acute) exacerbation: Secondary | ICD-10-CM | POA: Diagnosis not present

## 2019-08-25 DIAGNOSIS — E039 Hypothyroidism, unspecified: Secondary | ICD-10-CM | POA: Diagnosis not present

## 2019-08-25 DIAGNOSIS — E785 Hyperlipidemia, unspecified: Secondary | ICD-10-CM | POA: Diagnosis not present

## 2019-08-25 DIAGNOSIS — I10 Essential (primary) hypertension: Secondary | ICD-10-CM | POA: Insufficient documentation

## 2019-08-25 DIAGNOSIS — I83812 Varicose veins of left lower extremities with pain: Secondary | ICD-10-CM | POA: Insufficient documentation

## 2019-08-25 DIAGNOSIS — K219 Gastro-esophageal reflux disease without esophagitis: Secondary | ICD-10-CM | POA: Insufficient documentation

## 2019-08-25 MED ORDER — CETIRIZINE HCL 10 MG PO TABS
10.0000 mg | ORAL_TABLET | Freq: Every day | ORAL | 0 refills | Status: DC
Start: 2019-08-25 — End: 2020-03-17

## 2019-08-25 MED ORDER — PREDNISONE 10 MG PO TABS
40.0000 mg | ORAL_TABLET | Freq: Every day | ORAL | 0 refills | Status: AC
Start: 2019-08-25 — End: 2019-08-28

## 2019-08-25 NOTE — ED Provider Notes (Signed)
[facility]    CSN: [id number] Arrival date & time: 08/25/19  1425      History   Chief Complaint Chief Complaint  Patient presents with  . Shortness of Breath  . Otalgia  . Nasal Congestion    HPI Alexandra Brewer is a 64 y.o. female.   Patient with history of asthma presents for 1 day history of nasal congestion runny nose itchy eyes feeling short of breath with exertion.  She reports that she is working and moving around a lot she would feel little short of breath.  She reports using her albuterol inhaler and this helped.  She denies chest pain at any point..  She denies feeling sweaty or nauseous when this occurs.  She reports this is consistent with when she has had asthma exacerbations before.  She reports she has been sneezing has had a slight sore throat as well.  She reports she has history of allergies and is taking allergy medicines before.  She also has had some ear fullness and ear itchy sensation.     Past Medical History:  Diagnosis Date  . Asthma   . Constipation   . Hyperlipemia   . Sciatica   . Thyroid disease     Patient Active Problem List   Diagnosis Date Noted  . Abdominal pain 03/10/2018  . Sciatica of right side 06/11/2017  . GERD (gastroesophageal reflux disease) 11/26/2016  . Right Achilles tendinitis 08/15/2016  . Hypertension 07/05/2016  . Adhesive capsulitis of right shoulder 02/21/2016  . Heel pain, bilateral 01/10/2016  . 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 History of colonic polyps   . Diverticulosis of colon without hemorrhage   . Hypothyroidism 08/10/2015  . Chronic constipation 08/05/2015  . Hyperlipidemia 08/05/2015    Past Surgical History:  Procedure Laterality Date  . CATARACT EXTRACTION    . COLONOSCOPY N/A 10/10/2015   Procedure: COLONOSCOPY;  Surgeon: Daneil Dolin, MD;  Location[facility];  Service: Endoscopy;  Laterality: N/A;  245 - Interpreter scheduled, do NOT move     OB History    Gravida  2   Para  1   Term  1   Preterm      AB  1   Living        SAB  1   TAB      Ectopic      Multiple      Live Births  1            Home Medications    Prior to Admission medications   Medication Sig Start Date End Date Taking? Authorizing Provider  albuterol (VENTOLIN HFA) 108 (90 Base) MCG/ACT inhaler Inhale 2 puffs into the lungs every 6 (six) hours as needed for wheezing or shortness of breath. 03/06/19   Ladell Pier, MD  amLODipine (NORVASC) 5 MG tablet Take 1 tablet (5 mg total) by mouth daily. 07/06/19   Ladell Pier, MD  atorvastatin (LIPITOR) 20 MG tablet Take 1 tablet (20 mg total) by mouth daily. Needs appt for more refills. 01/22/19   Ladell Pier, MD  cetirizine (ZYRTEC ALLERGY) 10 MG tablet Take 1 tablet (10 mg total) by mouth daily. 08/25/19   Omarian Jaquith, Marguerita Beards, PA-C  diclofenac sodium (VOLTAREN) 1 % GEL Apply 4 g topically 4 (four) times daily. For joint pain 09/04/18   Fulp, Cammie, MD  fluticasone (FLONASE) 50 MCG/ACT nasal spray Place 2 sprays into both  nostrils daily as needed for allergies or rhinitis. 07/06/19   Ladell Pier, MD  levothyroxine (SYNTHROID) 112 MCG tablet Take 1 tablet (112 mcg total) by mouth daily. 07/06/19   Ladell Pier, MD  loratadine (CLARITIN) 10 MG tablet Take 1 tablet (10 mg total) by mouth daily. Patient not taking: Reported on 06/03/2019 03/06/19   Ladell Pier, MD  meloxicam (MOBIC) 15 MG tablet Take 1 tablet (15 mg total) by mouth daily. Patient not taking: Reported on 06/03/2019 01/22/19   Ladell Pier, MD  omeprazole (PRILOSEC) 20 MG capsule Take 1 capsule (20 mg total) by mouth daily. 03/10/18   Carlis Stable, NP  predniSONE (DELTASONE) 10 MG tablet Take 4 tablets (40 mg total) by mouth daily with breakfast for 3 days. 08/25/19 08/28/19  Arial Galligan, Marguerita Beards, PA-C  silver sulfADIAZINE (SILVADENE) 1 % cream Apply to affected area twice a day to burn area 01/22/19   Ladell Pier, MD  Wheat Dextrin [facility]) POWD Take by mouth 2 (two) times daily. Takes 2 tbsp twice daily    [provider]    Family History Family History  Problem Relation Age of Onset  . Diabetes Father   . Hypertension Father   . Asthma Father   . Stroke Maternal Grandmother   . Stroke Maternal Grandfather   . Colon cancer Neg Hx     Social History Social History   Tobacco Use  . Smoking status: Never Smoker  . Smokeless tobacco: Never Used  Substance Use Topics  . Alcohol use: No    Alcohol/week: 0.0 standard drinks  . Drug use: No     Allergies   Naproxen, Fish allergy, Penicillins, and Shellfish allergy   Review of Systems Review of Systems   Physical Exam Triage Vital Signs ED Triage Vitals [08/25/19 1555]  Enc Vitals Group     BP 139/67     Pulse Rate 87     Resp 16     Temp 98.6 F (37 C)     Temp Source Oral     SpO2 99 %     Weight      Height      Head Circumference      Peak Flow      Pain Score      Pain Loc      Pain Edu?      Excl. in [place]?    No data found.  Updated Vital Signs BP 139/67 (BP Location: Left Arm)   Pulse 87   Temp 98.6 F (37 C) (Oral)   Resp 16   SpO2 99%   Visual Acuity Right Eye Distance:   Left Eye Distance:   Bilateral Distance:    Right Eye Near:   Left Eye Near:    Bilateral Near:     Physical Exam Vitals and nursing note reviewed.  Constitutional:      General: She is not in acute distress.    Appearance: She is well-developed. She is not ill-appearing or diaphoretic.  HENT:     Head: Normocephalic and atraumatic.     Nose: Mucosal edema, congestion and rhinorrhea present. Rhinorrhea is clear.     Right Turbinates: Swollen and pale.     Left Turbinates: Swollen and pale.     Mouth/Throat:     Mouth: Mucous membranes are moist.  Eyes:     Conjunctiva/sclera: Conjunctivae normal.  Cardiovascular:     Rate and Rhythm: Normal rate and regular rhythm.  Heart sounds: No murmur.   Pulmonary:     Effort: Pulmonary effort is normal. No tachypnea, accessory muscle usage or respiratory distress.     Breath sounds: Wheezing (Very mild occasional inspiratory wheeze in lower fields bilaterally) present. No decreased breath sounds, rhonchi or rales.  Abdominal:     Palpations: Abdomen is soft.     Tenderness: There is no abdominal tenderness.  Musculoskeletal:     Cervical back: Neck supple.  Skin:    General: Skin is warm and dry.  Neurological:     Mental Status: She is alert.      UC Treatments / Results  Labs (all labs ordered are listed, but only abnormal results are displayed) Labs Reviewed  SARS CORONAVIRUS 2 (TAT 6-24 HRS)    EKG Normal sinus rhythm.  Borderline low voltage QRS.  Otherwise normal EKG  Radiology No results found.  Procedures Procedures (including critical care time)  Medications Ordered in UC Medications - No data to display  Initial Impression / Assessment and Plan / UC Course  I have reviewed the triage vital signs and the nursing notes.  Pertinent labs & imaging results that were available during my care of the patient were reviewed by me and considered in my medical decision making (see chart for details).     #Mild asthma exacerbation #Viral URI Patient is a 64 year old with history of asthma presenting with mild asthma exacerbation secondary to either allergy or viral URI.  EKG was reassuring.  Faint wheezing however otherwise benign lung exam.  Saturating 99% and not in respiratory distress.  Will place on a short course of prednisone.  Recommend Flonase and Zyrtec continued for allergy symptoms.  Albuterol as needed.  Will follow up with primary care further discuss shortness of breath with exertion and further asthma management.  Emergency department return precautions were discussed.  Patient verbalized understanding  Spanish-speaking staff member was utilized for translation and interpreter services. Final Clinical  Impressions(s) / UC Diagnoses   Final diagnoses:  Viral upper respiratory tract infection     Discharge Instructions     tome la prednisona por la maana durante 3 das  contine usando su albuterol segn sea necesario  use flonase diariamente como ya se le indic  tomar zyrtec diariamente  si los sntomas empeoran, regrese o vaya al departamento de Psychiatrist un seguimiento con su atencin primaria para discutir sus sntomas de astma      ED Prescriptions    Medication Sig Dispense Auth. Provider   predniSONE (DELTASONE) 10 MG tablet Take 4 tablets (40 mg total) by mouth daily with breakfast for 3 days. 12 tablet Isaak Delmundo, Marguerita Beards, PA-C   cetirizine (ZYRTEC ALLERGY) 10 MG tablet Take 1 tablet (10 mg total) by mouth daily. 30 tablet Usman Millett, Marguerita Beards, PA-C     PDMP not reviewed this encounter.   Purnell Shoemaker, PA-C 08/25/19 1825

## 2019-08-25 NOTE — ED Triage Notes (Signed)
Pt reports shortness of breath on exertion, cough, sore throat, nasal congestion and itchy ears x 1 day. Albuterol inhaler gives somewhat relief.

## 2019-08-25 NOTE — Discharge Instructions (Signed)
tome la prednisona por la maana durante 3 das  contine usando su albuterol segn sea necesario  use flonase diariamente como ya se le indic  tomar zyrtec diariamente  si los sntomas empeoran, regrese o vaya al departamento de Psychiatrist un seguimiento con su atencin primaria para discutir sus sntomas de astma

## 2019-08-26 LAB — SARS CORONAVIRUS 2 (TAT 6-24 HRS): SARS Coronavirus 2: NEGATIVE

## 2019-09-14 MED FILL — ?AMLODIPINE BESYLATE 5MG TA: 5 | 30 days supply | Qty: 30 | Fill #1

## 2019-09-14 MED FILL — LEVOTHYROXINE 112 MCG TAB: 112 | 30 days supply | Qty: 30 | Fill #2

## 2019-09-14 MED FILL — MELOXICAM 15 MG TABLET: 15 | 30 days supply | Qty: 30 | Fill #1

## 2019-09-14 MED FILL — FLUTICASONE PROP 50 MCG SPR: 50 | 30 days supply | Qty: 16 | Fill #1

## 2019-10-19 MED FILL — LEVOTHYROXINE 112 MCG TAB: 112 | 30 days supply | Qty: 30 | Fill #3

## 2019-10-19 MED FILL — ?AMLODIPINE BESYLATE 5MG TA: 5 | 30 days supply | Qty: 30 | Fill #2

## 2019-11-09 ENCOUNTER — Ambulatory Visit: Payer: Self-pay | Admitting: Physician Assistant

## 2019-11-10 ENCOUNTER — Ambulatory Visit: Payer: Self-pay | Attending: Internal Medicine | Admitting: Internal Medicine

## 2019-11-10 ENCOUNTER — Other Ambulatory Visit: Payer: Self-pay

## 2019-11-17 ENCOUNTER — Other Ambulatory Visit: Payer: Self-pay | Admitting: Family Medicine

## 2019-11-17 DIAGNOSIS — M25562 Pain in left knee: Secondary | ICD-10-CM

## 2019-11-17 MED FILL — LEVOTHYROXINE 112 MCG TAB: 112 | 30 days supply | Qty: 30 | Fill #4

## 2019-11-17 NOTE — Telephone Encounter (Signed)
1) Medication(s) Requested (by name):diclofenac sodium (VOLTAREN) 1 % GEL    2) Pharmacy of Choice: [facility] pharmacy   3) Special Requests:   Approved medications will be sent to the pharmacy, we will reach out if there is an issue.  Requests made after 3pm may not be addressed until the following business day!  If a patient is unsure of the name of the medication(s) please note and ask patient to call back when they are able to provide all info, do not send to responsible party until all information is available!

## 2019-11-18 MED FILL — !VENTOLIN HFA INHALER: 108 (90 BAS | 25 days supply | Qty: 18 | Fill #1

## 2019-11-20 ENCOUNTER — Other Ambulatory Visit: Payer: Self-pay | Admitting: Internal Medicine

## 2019-11-20 MED FILL — DICLOFENAC SODIUM 1% GEL: 1 | 6 days supply | Qty: 100 | Fill #0

## 2019-11-23 ENCOUNTER — Ambulatory Visit (INDEPENDENT_AMBULATORY_CARE_PROVIDER_SITE_OTHER): Payer: Self-pay

## 2019-11-23 ENCOUNTER — Other Ambulatory Visit: Payer: Self-pay | Admitting: Radiology

## 2019-11-23 ENCOUNTER — Ambulatory Visit (INDEPENDENT_AMBULATORY_CARE_PROVIDER_SITE_OTHER): Payer: Self-pay | Admitting: Physician Assistant

## 2019-11-23 ENCOUNTER — Encounter: Payer: Self-pay | Admitting: Physician Assistant

## 2019-11-23 DIAGNOSIS — M5431 Sciatica, right side: Secondary | ICD-10-CM

## 2019-11-23 DIAGNOSIS — M25562 Pain in left knee: Secondary | ICD-10-CM

## 2019-11-23 DIAGNOSIS — M79601 Pain in right arm: Secondary | ICD-10-CM

## 2019-11-23 DIAGNOSIS — M542 Cervicalgia: Secondary | ICD-10-CM

## 2019-11-23 MED ORDER — MELOXICAM 15 MG PO TABS: 15.0000 mg | ORAL_TABLET | Freq: Every day | ORAL | 3 refills | Status: DC

## 2019-11-23 MED ORDER — CYCLOBENZAPRINE HCL 10 MG PO TABS: 10.0000 mg | ORAL_TABLET | Freq: Every day | ORAL | 1 refills | Status: DC

## 2019-11-23 MED ORDER — DICLOFENAC SODIUM 1 % EX GEL: CUTANEOUS | 0 refills | Status: DC

## 2019-11-23 MED ORDER — METHYLPREDNISOLONE 4 MG PO TABS
ORAL_TABLET | ORAL | 0 refills | Status: DC
Start: 2019-11-23 — End: 2020-01-29

## 2019-11-23 MED FILL — ?METHYLPREDNISOLONE 4MG TAB: 4 | 6 days supply | Qty: 21 | Fill #0

## 2019-11-23 MED FILL — MELOXICAM 15 MG TABLET: 15 | 30 days supply | Qty: 30 | Fill #0

## 2019-11-23 MED FILL — CYCLOBENZAPRINE 10 MG TAB: 10 | 20 days supply | Qty: 20 | Fill #0

## 2019-11-23 MED FILL — DICLOFENAC SODIUM 1% GEL: 1 | 6 days supply | Qty: 100 | Fill #0

## 2019-11-23 NOTE — Progress Notes (Signed)
Office Visit Note   Patient: Alexandra Brewer           Date of Birth: [birth year]           MRN: [id number] Visit Date: 11/23/2019              Requested by: Ladell Pier, MD [address] [place],  [place] [address] PCP: Ladell Pier, MD   Assessment & Plan: Visit Diagnoses:  1. Neck pain   2. Right arm pain   3. Acute pain of left knee   4. Sciatica of right side     Plan: She did ask about a nodule on the left tibia she had a recent MRI less than a year ago this does show some prominence of her proximal tibia.  There is no erythema no abnormal warmth or pain in this area.  She wants a refill Voltaren gel to apply over the knee as needed.  She also wants refill on her meloxicam.  We placed on a Medrol Dosepak she is not to take the meloxicam or any NSAIDs while on the Medrol Dosepak.  She is also given a prescription for Flexeril to take at night.  We will send her to formal physical therapy for range of motion home exercise program modalities to the cervical spine.  Have her follow-up with [place] in 1 month sooner if things become worse or she has any questions concerns.  Questions were encouraged and answered at length today using an interpreter is present throughout the examination.  Follow-Up Instructions: Return in about 4 weeks (around 12/21/2019).   Orders:  Orders Placed This Encounter  Procedures  . XR Cervical Spine 2 or 3 views   Meds ordered this encounter  Medications  . methylPREDNISolone (MEDROL) 4 MG tablet    Sig: Take as directed    Dispense:  21 tablet    Refill:  0  . cyclobenzaprine (FLEXERIL) 10 MG tablet    Sig: Take 1 tablet (10 mg total) by mouth at bedtime.    Dispense:  20 tablet    Refill:  1  . diclofenac Sodium (VOLTAREN) 1 % GEL    Sig: APPLY 4 GRAMS TOPICALLY 4 TIMES DAILY. FOR JOINT PAIN    Dispense:  100 g    Refill:  0    Must have office visit for refills  . meloxicam (MOBIC) 15 MG tablet    Sig: Take 1 tablet (15 mg  total) by mouth daily.    Dispense:  30 tablet    Refill:  3      Procedures: No procedures performed   Clinical Data: No additional findings.   Subjective: Chief Complaint  Patient presents with  . Neck - Pain  . Right Arm - Pain    HPI  Patient returns today with a new complaint of right arm pain.  States she has had a pinching burning sensation down the right arm that starts about her shoulder and goes down into her hand numbness at times.  She notes neck pain with decreased range of motion of the neck particularly when turning to the right.  She has had no new injury.  She feels this all started when she fell 5 years ago onto her right arm.  She has had no new injury.  However the pains have been going on for the past 2 months.  Does awaken her rarely.  She comes in today with a interpreter as she speaks [place].  Review of Systems See HPI.  Objective: Vital Signs: There were no vitals taken for this visit.  Physical Exam Constitutional:      Appearance: She is not ill-appearing or diaphoretic.  Cardiovascular:     Pulses: Normal pulses.  Pulmonary:     Effort: Pulmonary effort is normal.  Neurological:     Mental Status: She is alert and oriented to person, place, and time.  Psychiatric:        Mood and Affect: Mood normal.     Ortho Exam Cervical spine she has full flexion-extension without pain.  Rotation right and left causes discomfort.  She has tenderness in the right trapezius region.  5 out of 5 strength throughout the upper extremity against resistance.  Full motor full sensation bilateral hands. Specialty Comments:  No specialty comments available.  Imaging: XR Cervical Spine 2 or 3 views  Result Date: 11/23/2019 Cervical spine 2 views: This space overall well-maintained.  Loss of lordotic curvature.  Some minimal anterior vertebral body spurring.  Otherwise acute findings or fractures.    PMFS History: Patient Active Problem List   Diagnosis  Date Noted  . Abdominal pain 03/10/2018  . Sciatica of right side 06/11/2017  . GERD (gastroesophageal reflux disease) 11/26/2016  . Right Achilles tendinitis 08/15/2016  . Hypertension 07/05/2016  . Adhesive capsulitis of right shoulder 02/21/2016  . Heel pain, bilateral 01/10/2016  . 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 History of colonic polyps   . Diverticulosis of colon without hemorrhage   . Hypothyroidism 08/10/2015  . Chronic constipation 08/05/2015  . Hyperlipidemia 08/05/2015   Past Medical History:  Diagnosis Date  . Asthma   . Constipation   . Hyperlipemia   . Sciatica   . Thyroid disease     Family History  Problem Relation Age of Onset  . Diabetes Father   . Hypertension Father   . Asthma Father   . Stroke Maternal Grandmother   . Stroke Maternal Grandfather   . Colon cancer Neg Hx     Past Surgical History:  Procedure Laterality Date  . CATARACT EXTRACTION    . COLONOSCOPY N/A 10/10/2015   Procedure: COLONOSCOPY;  Surgeon: Daneil Dolin, MD;  Location: [facility];  Service: Endoscopy;  Laterality: N/A;  245 - Interpreter scheduled, do NOT move   Social History   Occupational History  . Not on file  Tobacco Use  . Smoking status: Never Smoker  . Smokeless tobacco: Never Used  Vaping Use  . Vaping Use: Never used  Substance and Sexual Activity  . Alcohol use: No    Alcohol/week: 0.0 standard drinks  . Drug use: No  . Sexual activity: Not Currently

## 2019-11-30 ENCOUNTER — Ambulatory Visit (HOSPITAL_COMMUNITY)
Admission: EM | Admit: 2019-11-30 | Discharge: 2019-11-30 | Disposition: A | Discharge status: Home / Self Care | Payer: HRSA Program | Attending: Family Medicine | Admitting: Family Medicine

## 2019-11-30 ENCOUNTER — Other Ambulatory Visit: Payer: Self-pay

## 2019-11-30 ENCOUNTER — Encounter (HOSPITAL_COMMUNITY): Payer: Self-pay | Admitting: Emergency Medicine

## 2019-11-30 DIAGNOSIS — Z886 Allergy status to analgesic agent status: Secondary | ICD-10-CM | POA: Diagnosis not present

## 2019-11-30 DIAGNOSIS — J028 Acute pharyngitis due to other specified organisms: Secondary | ICD-10-CM | POA: Insufficient documentation

## 2019-11-30 DIAGNOSIS — Z7952 Long term (current) use of systemic steroids: Secondary | ICD-10-CM | POA: Insufficient documentation

## 2019-11-30 DIAGNOSIS — Z791 Long term (current) use of non-steroidal anti-inflammatories (NSAID): Secondary | ICD-10-CM | POA: Diagnosis not present

## 2019-11-30 DIAGNOSIS — J029 Acute pharyngitis, unspecified: Secondary | ICD-10-CM

## 2019-11-30 DIAGNOSIS — B349 Viral infection, unspecified: Secondary | ICD-10-CM | POA: Diagnosis not present

## 2019-11-30 DIAGNOSIS — J45909 Unspecified asthma, uncomplicated: Secondary | ICD-10-CM | POA: Insufficient documentation

## 2019-11-30 DIAGNOSIS — E785 Hyperlipidemia, unspecified: Secondary | ICD-10-CM | POA: Diagnosis not present

## 2019-11-30 DIAGNOSIS — R0981 Nasal congestion: Secondary | ICD-10-CM | POA: Insufficient documentation

## 2019-11-30 DIAGNOSIS — K219 Gastro-esophageal reflux disease without esophagitis: Secondary | ICD-10-CM | POA: Insufficient documentation

## 2019-11-30 DIAGNOSIS — Z79890 Hormone replacement therapy (postmenopausal): Secondary | ICD-10-CM | POA: Diagnosis not present

## 2019-11-30 DIAGNOSIS — Z79899 Other long term (current) drug therapy: Secondary | ICD-10-CM | POA: Insufficient documentation

## 2019-11-30 DIAGNOSIS — Z880 Allergy status to penicillin: Secondary | ICD-10-CM | POA: Insufficient documentation

## 2019-11-30 DIAGNOSIS — Z20822 Contact with and (suspected) exposure to covid-19: Secondary | ICD-10-CM | POA: Diagnosis not present

## 2019-11-30 DIAGNOSIS — E039 Hypothyroidism, unspecified: Secondary | ICD-10-CM | POA: Insufficient documentation

## 2019-11-30 DIAGNOSIS — I10 Essential (primary) hypertension: Secondary | ICD-10-CM | POA: Insufficient documentation

## 2019-11-30 DIAGNOSIS — J302 Other seasonal allergic rhinitis: Secondary | ICD-10-CM

## 2019-11-30 NOTE — ED Triage Notes (Signed)
Pt presents with sore throat, itchy eyes, nasal congestion, minor chest pain, burning in eyes, itching in ear.  Denies fever, SOB, cough, n,v,d, abdominal pain, loss of taste and smell.   States has not use any OTC medications.

## 2019-11-30 NOTE — ED Provider Notes (Signed)
[place]    CSN: [id number] Arrival date & time: 11/30/19  1633      History   Chief Complaint Chief Complaint  Patient presents with  . Sore Throat    HPI Alexandra Brewer is a 64 y.o. female.   Pt presents complains of nasal congestion, itching eyes, itching ears, and sore throat that started several days ago.  Pt has seasonal allergies and reports intermittent itching eyes and ears.  She is currently using Flonase and Zyrtec daily.  Denies cough, shortness of breath, wheezing.  She denies chest pain.  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  Denies sick contacts.  Reports her employer let her have tomorrow off, she would like to return Wednesday.       Past Medical History:  Diagnosis Date  . Asthma   . Constipation   . Hyperlipemia   . Sciatica   . Thyroid disease     Patient Active Problem List   Diagnosis Date Noted  . Abdominal pain 03/10/2018  . Sciatica of right side 06/11/2017  . GERD (gastroesophageal reflux disease) 11/26/2016  . Right Achilles tendinitis 08/15/2016  . Hypertension 07/05/2016  . Adhesive capsulitis of right shoulder 02/21/2016  . Heel pain, bilateral 01/10/2016  . 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 History of colonic polyps   . Diverticulosis of colon without hemorrhage   . Hypothyroidism 08/10/2015  . Chronic constipation 08/05/2015  . Hyperlipidemia 08/05/2015    Past Surgical History:  Procedure Laterality Date  . CATARACT EXTRACTION    . COLONOSCOPY N/A 10/10/2015   Procedure: COLONOSCOPY;  Surgeon: Daneil Dolin, MD;  Location: [facility];  Service: Endoscopy;  Laterality: N/A;  245 - Interpreter scheduled, do NOT move    OB History    Gravida  2   Para  1   Term  1   Preterm      AB  1   Living        SAB  1   TAB      Ectopic      Multiple      Live Births  1            Home Medications    Prior to  Admission medications   Medication Sig Start Date End Date Taking? Authorizing Provider  albuterol (VENTOLIN HFA) 108 (90 Base) MCG/ACT inhaler Inhale 2 puffs into the lungs every 6 (six) hours as needed for wheezing or shortness of breath. 03/06/19  Yes Ladell Pier, MD  fluticasone (FLONASE) 50 MCG/ACT nasal spray Place 2 sprays into both nostrils daily as needed for allergies or rhinitis. 07/06/19  Yes Ladell Pier, MD  amLODipine (NORVASC) 5 MG tablet Take 1 tablet (5 mg total) by mouth daily. 07/06/19   Ladell Pier, MD  cetirizine (ZYRTEC ALLERGY) 10 MG tablet Take 1 tablet (10 mg total) by mouth daily. 08/25/19   Darr, Marguerita Beards, PA-C  cyclobenzaprine (FLEXERIL) 10 MG tablet Take 1 tablet (10 mg total) by mouth at bedtime. 11/23/19   Pete Pelt, PA-C  diclofenac Sodium (VOLTAREN) 1 % GEL APPLY 4 GRAMS TOPICALLY 4 TIMES DAILY. FOR JOINT PAIN 11/23/19   Pete Pelt, PA-C  levothyroxine (SYNTHROID) 112 MCG tablet Take 1 tablet (112 mcg total) by mouth daily. 07/06/19   Ladell Pier, MD  loratadine (CLARITIN) 10 MG tablet Take 1 tablet (10 mg total) by mouth  daily. 03/06/19   Ladell Pier, MD  meloxicam (MOBIC) 15 MG tablet Take 1 tablet (15 mg total) by mouth daily. 11/23/19   Pete Pelt, PA-C  methylPREDNISolone (MEDROL) 4 MG tablet Take as directed 11/23/19   Pete Pelt, PA-C  omeprazole (PRILOSEC) 20 MG capsule Take 1 capsule (20 mg total) by mouth daily. 03/10/18   Carlis Stable, NP  silver sulfADIAZINE (SILVADENE) 1 % cream Apply to affected area twice a day to burn area 01/22/19   Ladell Pier, MD  Wheat Dextrin [facility]) POWD Take by mouth 2 (two) times daily. Takes 2 tbsp twice daily    [provider]    Family History Family History  Problem Relation Age of Onset  . Diabetes Father   . Hypertension Father   . Asthma Father   . Stroke Maternal Grandmother   . Stroke Maternal Grandfather   . Colon cancer Neg Hx     Social  History Social History   Tobacco Use  . Smoking status: Never Smoker  . Smokeless tobacco: Never Used  Vaping Use  . Vaping Use: Never used  Substance Use Topics  . Alcohol use: No    Alcohol/week: 0.0 standard drinks  . Drug use: No     Allergies   Naproxen, Fish allergy, Penicillins, and Shellfish allergy   Review of Systems Review of Systems  Constitutional: Negative for chills and fever.  HENT: Positive for congestion, postnasal drip and sore throat. Negative for ear discharge, ear pain, sinus pressure and sinus pain.   Eyes: Negative for pain and visual disturbance.  Respiratory: Negative for cough, chest tightness, shortness of breath and wheezing.   Cardiovascular: Negative for chest pain and palpitations.  Gastrointestinal: Negative for abdominal pain, diarrhea, nausea and vomiting.  Genitourinary: Negative for dysuria and hematuria.  Musculoskeletal: Negative for arthralgias and back pain.  Skin: Negative for color change and rash.  Neurological: Negative for seizures and syncope.  All other systems reviewed and are negative.    Physical Exam Triage Vital Signs ED Triage Vitals  Enc Vitals Group     BP 11/30/19 1814 (!) 148/76     Pulse Rate 11/30/19 1814 72     Resp 11/30/19 1814 19     Temp 11/30/19 1814 98.9 F (37.2 C)     Temp Source 11/30/19 1814 Oral     SpO2 11/30/19 1814 98 %     Weight --      Height --      Head Circumference --      Peak Flow --      Pain Score 11/30/19 1819 6     Pain Loc --      Pain Edu? --      Excl. in [place]? --    No data found.  Updated Vital Signs BP (!) 148/76 (BP Location: Left Arm)   Pulse 72   Temp 98.9 F (37.2 C) (Oral)   Resp 19   SpO2 98%   Visual Acuity Right Eye Distance:   Left Eye Distance:   Bilateral Distance:    Right Eye Near:   Left Eye Near:    Bilateral Near:     Physical Exam Vitals and nursing note reviewed.  Constitutional:      General: She is not in acute distress.     Appearance: She is well-developed.  HENT:     Head: Normocephalic and atraumatic.     Right Ear: Tympanic membrane, ear canal and  external ear normal.     Left Ear: Tympanic membrane, ear canal and external ear normal.     Mouth/Throat:     Pharynx: Oropharynx is clear. No pharyngeal swelling, oropharyngeal exudate or posterior oropharyngeal erythema.  Eyes:     Conjunctiva/sclera: Conjunctivae normal.  Cardiovascular:     Rate and Rhythm: Normal rate and regular rhythm.     Heart sounds: No murmur heard.   Pulmonary:     Effort: Pulmonary effort is normal. No respiratory distress.     Breath sounds: Normal breath sounds. No stridor, decreased air movement or transmitted upper airway sounds. No decreased breath sounds.  Abdominal:     Palpations: Abdomen is soft.     Tenderness: There is no abdominal tenderness.  Musculoskeletal:     Cervical back: Neck supple.  Skin:    General: Skin is warm and dry.  Neurological:     Mental Status: She is alert.      UC Treatments / Results  Labs (all labs ordered are listed, but only abnormal results are displayed) Labs Reviewed  SARS CORONAVIRUS 2 (TAT 6-24 HRS)    EKG   Radiology No results found.  Procedures Procedures (including critical care time)  Medications Ordered in UC Medications - No data to display  Initial Impression / Assessment and Plan / UC Course  I have reviewed the triage vital signs and the nursing notes.  Pertinent labs & imaging results that were available during my care of the patient were reviewed by me and considered in my medical decision making (see chart for details).     Viral pharyngitis.  Pt with h/o asthma.  Denies recent shortness of breath, wheezing, cough.  Lungs clear to auscultation on exam.  She will continue with Flonase and Zyrtec.  Advised pt to use salt water gargles. She will follow up with PCP, feels that her current medications are not helping her seasonal allergy symptoms.  She  would like to return to work on Wednesday, note given.  Final Clinical Impressions(s) / UC Diagnoses   Final diagnoses:  None   Discharge Instructions   None    ED Prescriptions    None     PDMP not reviewed this encounter.   Konrad Felix, PA-C 11/30/19 1942

## 2019-12-01 LAB — SARS CORONAVIRUS 2 (TAT 6-24 HRS): SARS Coronavirus 2: NEGATIVE

## 2019-12-03 ENCOUNTER — Other Ambulatory Visit: Payer: Self-pay

## 2019-12-03 ENCOUNTER — Ambulatory Visit (INDEPENDENT_AMBULATORY_CARE_PROVIDER_SITE_OTHER): Payer: Self-pay | Admitting: Primary Care

## 2019-12-03 DIAGNOSIS — B9789 Other viral agents as the cause of diseases classified elsewhere: Secondary | ICD-10-CM

## 2019-12-03 DIAGNOSIS — J988 Other specified respiratory disorders: Secondary | ICD-10-CM

## 2019-12-03 MED ORDER — DM-GUAIFENESIN ER 30-600 MG PO TB12: 1.0000 | ORAL_TABLET | Freq: Two times a day (BID) | ORAL | 1 refills | Status: DC

## 2019-12-03 MED FILL — CYCLOBENZAPRINE 10 MG TAB: 10 | 20 days supply | Qty: 20 | Fill #0

## 2019-12-03 MED FILL — MELOXICAM 15 MG TABLET: 15 | 30 days supply | Qty: 30 | Fill #0

## 2019-12-03 MED FILL — ?METHYLPREDNISOLONE 4MG TAB: 4 | 6 days supply | Qty: 21 | Fill #0

## 2019-12-03 MED FILL — DICLOFENAC SODIUM 1% GEL: 1 | 6 days supply | Qty: 100 | Fill #0

## 2019-12-03 NOTE — Progress Notes (Signed)
Telephone Note  I connected with Alexandra Brewer on 12/03/19 at  3:50 PM EDT by telephone and verified that I am speaking with the correct person using two identifiers.  Patient was at home   I discussed the limitations, risks, security and privacy concerns of performing an evaluation and management service by telephone and the availability of in person appointments. I also discussed with the patient that there may be a patient responsible charge related to this service. The patient expressed understanding and agreed to proceed. Juluis Mire, Np   History of Present Illness: Ms.Alexandra Brewer is a 64 year old Hispanic female seen in the ED on 11/30/19 diagnosis viral phalangitis- told to continue allergies medication and Flonase. Some relief with prednisone prescribe by ortho and symptoms became better but she stills has a sore throat, congestion and tenderness around the jaw line.  Requesting a referral to an allergist. Will refer but needs f/u with PCP for Bp and thyroid. Past Medical History:  Diagnosis Date  . Asthma   . Constipation   . Hyperlipemia   . Sciatica   . Thyroid disease    Current Outpatient Medications on File Prior to Visit  Medication Sig Dispense Refill  . albuterol (VENTOLIN HFA) 108 (90 Base) MCG/ACT inhaler Inhale 2 puffs into the lungs every 6 (six) hours as needed for wheezing or shortness of breath. 8 g 12  . amLODipine (NORVASC) 5 MG tablet Take 1 tablet (5 mg total) by mouth daily. 30 tablet 6  . cetirizine (ZYRTEC ALLERGY) 10 MG tablet Take 1 tablet (10 mg total) by mouth daily. 30 tablet 0  . cyclobenzaprine (FLEXERIL) 10 MG tablet Take 1 tablet (10 mg total) by mouth at bedtime. 20 tablet 1  . diclofenac Sodium (VOLTAREN) 1 % GEL APPLY 4 GRAMS TOPICALLY 4 TIMES DAILY. FOR JOINT PAIN 100 g 0  . fluticasone (FLONASE) 50 MCG/ACT nasal spray Place 2 sprays into both nostrils daily as needed for allergies or rhinitis. 16 g 6  . levothyroxine (SYNTHROID)  112 MCG tablet Take 1 tablet (112 mcg total) by mouth daily. 30 tablet 6  . loratadine (CLARITIN) 10 MG tablet Take 1 tablet (10 mg total) by mouth daily. 30 tablet 3  . meloxicam (MOBIC) 15 MG tablet Take 1 tablet (15 mg total) by mouth daily. 30 tablet 3  . methylPREDNISolone (MEDROL) 4 MG tablet Take as directed 21 tablet 0  . omeprazole (PRILOSEC) 20 MG capsule Take 1 capsule (20 mg total) by mouth daily. 90 capsule 3  . silver sulfADIAZINE (SILVADENE) 1 % cream Apply to affected area twice a day to burn area 25 g 0  . Wheat Dextrin (BENEFIBER) POWD Take by mouth 2 (two) times daily. Takes 2 tbsp twice daily     No current facility-administered medications on file prior to visit.   Observations/Objective: Review of Systems  HENT: Positive for congestion and sore throat.   Respiratory: Positive for cough, sputum production and wheezing.   All other systems reviewed and are negative.  Assessment and Plan: Diagnoses and all orders for this visit:  Viral respiratory illness -     Ambulatory referral to Allergy -     dextromethorphan-guaiFENesin (MUCINEX DM) 30-600 MG 12hr tablet; Take 1 tablet by mouth 2 (two) times daily.    Follow Up Instructions:    I discussed the assessment and treatment plan with the patient. The patient was provided an opportunity to ask questions and all were answered. The patient agreed with  the plan and demonstrated an understanding of the instructions.   The patient was advised to call back or seek an in-person evaluation if the symptoms worsen or if the condition fails to improve as anticipated.  I provided 12 minutes of non-face-to-face time during this encounter.   Kerin Perna, NP

## 2019-12-03 NOTE — Progress Notes (Signed)
DOB and name verified  C /o sore and itchy throat , headache, runny nose X 4 days

## 2019-12-14 ENCOUNTER — Other Ambulatory Visit: Payer: Self-pay

## 2019-12-14 ENCOUNTER — Ambulatory Visit: Payer: Self-pay | Attending: Physician Assistant | Admitting: Physical Therapy

## 2019-12-14 DIAGNOSIS — M5412 Radiculopathy, cervical region: Secondary | ICD-10-CM | POA: Insufficient documentation

## 2019-12-14 DIAGNOSIS — R293 Abnormal posture: Secondary | ICD-10-CM | POA: Insufficient documentation

## 2019-12-14 DIAGNOSIS — M62838 Other muscle spasm: Secondary | ICD-10-CM | POA: Insufficient documentation

## 2019-12-14 NOTE — Patient Instructions (Signed)
Access Code: [id number] Exercises .Gentle Levator Scapulae Stretch - 2 x daily - 7 x weekly - 3 sets - 3 reps - 20 hold .Standing Upper Trapezius Mobilization with Small Ball - 1 x daily - 7 x weekly - 3 sets - 1 reps - 1 min hold .Seated Upper Trapezius Stretch - 1 x daily - 7 x weekly - 3 sets - 3 reps - 20 hold

## 2019-12-15 ENCOUNTER — Encounter: Payer: Self-pay | Admitting: Physical Therapy

## 2019-12-15 NOTE — Therapy (Signed)
[place], [place], [address] Phone: [phone]   Fax:  [phone]  Physical Therapy Evaluation  Patient Details  Name: Alexandra Brewer MRN: [id number] Date of Birth: [birth year] Referring Provider (PT): Abagail Kitchens PA-C    Encounter Date: 12/14/2019   PT End of Session - 12/14/19 1522    Visit Number 1    Number of Visits 12    Date for PT Re-Evaluation 01/25/20    Authorization Type CAFA 100% covered    PT Start Time 1500    PT Stop Time 1544    PT Time Calculation (min) 44 min    Activity Tolerance Patient tolerated treatment well    Behavior During Therapy [facility] for tasks assessed/performed           Past Medical History:  Diagnosis Date   Asthma    Constipation    Hyperlipemia    Sciatica    Thyroid disease     Past Surgical History:  Procedure Laterality Date   CATARACT EXTRACTION     COLONOSCOPY N/A 10/10/2015   Procedure: COLONOSCOPY;  Surgeon: Daneil Dolin, MD;  [facility];  Service: Endoscopy;  Laterality: N/A;  245 - Interpreter scheduled, do NOT move    There were no vitals filed for this visit.    Subjective Assessment - 12/14/19 1504    Subjective Patient has had neck and right arm pain for about 2 months now. She has a histroy of sciatica but this is dfiferent. She works Education administrator houses. She feels like this can exacerbate her pain. She was given pills for the pain. They work for about 2-3 days then the pain comes back.    Patient is accompained by: Interpreter   Zigmund Gottron [phone]   Pertinent History asthma, thyroid disease    Limitations House hold activities    How long can you sit comfortably? N/A    How long can you stand comfortably? N/A    How long can you walk comfortably? N/A    Diagnostic tests X-ray C-spine: miniimal flattening of the normal curve; mild anterior spurring    Patient Stated Goals to have less pain in her neck and arm     Currently in Pain? Yes    Pain Score 6     Pain Location Neck    Pain Orientation Right    Pain Descriptors / Indicators Aching;Numbness    Pain Type Acute pain    Pain Radiating Towards the pain runs from the upper trap down her lateral upper arm and into her elbow    Pain Onset 1 to 4 weeks ago    Pain Frequency Constant    Aggravating Factors  use of the right arm    Pain Relieving Factors mediciation    Effect of Pain on Daily Activities difficulty working              [facility] PT Assessment - 12/15/19 0001      Assessment   Medical Diagnosis Cervical Pain with Right Radiculopathy     Referring Provider (PT) Abagail Kitchens PA-C     Hand Dominance Right    Next MD Visit 12/21/2019    Prior Therapy None       Precautions   Precautions None      Restrictions   Weight Bearing Restrictions No      Balance Screen   Has the patient fallen in the past 6 months No    Has the  patient had a decrease in activity level because of a fear of falling?  No    Is the patient reluctant to leave their home because of a fear of falling?  No      Home Ecologist residence      Prior Function   Level of Independence Independent    Vocation Full time employment    Vocation Requirements works cleaning houses     Leisure would like to be able to do her exercises      Observation/Other Assessments   Focus on Therapeutic Outcomes (FOTO)  Neck 52% limited Shoulder / UE 58% limited       Sensation   Light Touch Appears Intact    Additional Comments numbness and pain radiating to her elbow       Coordination   Gross Motor Movements are Fluid and Coordinated Yes    Fine Motor Movements are Fluid and Coordinated Yes      AROM   Overall AROM Comments pain with right end range ER behing the head in hr neck     Cervical Flexion 40 without pain     Cervical Extension 20 degrees with pain     Cervical - Right Side Bend painful     Cervical - Right Rotation 50%       Cervical - Left Rotation 70%       Strength   Strength Assessment Site Shoulder;Hand    Right/Left Shoulder Right    Right Shoulder Flexion 4/5    Right Shoulder Internal Rotation 4+/5    Right Shoulder External Rotation 4/5    Right/Left hand Right;Left    Right Hand Grip (lbs) 5    Left Hand Grip (lbs) 25      Special Tests   Other special tests hawkins (-) right                       Objective measurements completed on examination: See above findings.               PT Education - 12/14/19 1748    Education Details through intereter explained the benefits of strething and soft tissue mobilization    Person(s) Educated Patient    Methods Explanation;Demonstration;Verbal cues;Tactile cues    Comprehension Tactile cues required;Need further instruction            PT Short Term Goals - 12/14/19 1604      PT SHORT TERM GOAL #1   Title Therapy will review FOTO scores    Time 1    Period Weeks    Status New    Target Date 12/21/19      PT SHORT TERM GOAL #2   Title Patient will demonstrate improved grip by 10 lbs    Time 3    Period Weeks    Status New    Target Date 01/04/20      PT SHORT TERM GOAL #3   Title Patient will increase right cervical rotation by 10 degrees    Time 3    Period Weeks    Status New    Target Date 01/04/20      PT SHORT TERM GOAL #4   Title Patient will report a 50% reductuion in radicular pain into her right arm    Time 3    Period Weeks    Status New    Target Date 01/04/20  PT Long Term Goals - 12/14/19 1606      PT LONG TERM GOAL #1   Title patient will use her right arm for work tasks without increased radicular or cervical pain    Time 6    Period Weeks    Status New    Target Date 01/25/20      PT LONG TERM GOAL #2   Title Patient will demonstrate full cervical motion without increased ain in order to perfrom ADL's    Time 6    Period Weeks    Status New    Target Date  01/26/20      PT LONG TERM GOAL #3   Title Patient will demonstrate full pain free shoulder ROM without pain in order to perfrom work tasks    Time 6    Period Weeks    Status New    Target Date 01/26/20                  Plan - 12/14/19 1522    Clinical Impression Statement Patient is a 64 year old female with right cervical spine pain that radiates into her right elbow. The pain is worse with use of the right hand. She has a mild decrease in strength RzvL. she has pain with end range right houlder IR and ER. She has limited cervical extension and right ortation. She has spasming of her upper trap anr her shoulder blade area. She would benefit from skilled therapy to increase strength and range and decrease pain in spasming , in order to return to work without pain    Examination-Activity Limitations Carry;Lift;Reach Overhead    Examination-Participation Restrictions Cleaning;Laundry;Shop    Stability/Clinical Decision Making Evolving/Moderate complexity   increasing pain and numbnessinto the R UE   Clinical Decision Making Moderate    Rehab Potential Good    PT Frequency 2x / week    PT Duration 6 weeks    PT Treatment/Interventions ADLs/Self Care Home Management;Electrical Stimulation;Iontophoresis 4mg /ml Dexamethasone;Moist Heat;Cryotherapy;Gait training;Stair training;Functional mobility training;Therapeutic activities;Therapeutic exercise;Neuromuscular re-education;Patient/family education;Passive range of motion;Manual techniques;Taping;Dry needling    PT Next Visit Plan soft tissue mobilization to the upper trap; gente manual traction to the neck. scpaular and postural strengthening    PT Home Exercise Plan upper trap stretch; levator stretch, tennis ball trigger point release    Consulted and Agree with Plan of Care Patient           Patient will benefit from skilled therapeutic intervention in order to improve the following deficits and impairments:  Postural  dysfunction, Pain, Impaired UE functional use, Improper body mechanics, Decreased range of motion, Increased muscle spasms, Decreased activity tolerance  Visit Diagnosis: Radiculopathy, cervical region  Other muscle spasm  Abnormal posture     Problem List Patient Active Problem List   Diagnosis Date Noted   Abdominal pain 03/10/2018   Sciatica of right side 06/11/2017   GERD (gastroesophageal reflux disease) 11/26/2016   Right Achilles tendinitis 08/15/2016   Hypertension 07/05/2016   Adhesive capsulitis of right shoulder 02/21/2016   Heel pain, bilateral 01/10/2016   Varicose veins of left lower extremity with pain 01/10/2016   History of colonic polyps    Diverticulosis of colon without hemorrhage    Hypothyroidism 08/10/2015   Chronic constipation 08/05/2015   Hyperlipidemia 08/05/2015    Carney Living  PT DPT  12/15/2019, 1:13 PM  [facility] [address] [place], [place], [address] Phone: [phone]   Fax:  [phone]  Name: Alexandra Brewer MRN: [id number] Date of Birth: 06/15/1955

## 2019-12-21 ENCOUNTER — Encounter: Payer: Self-pay | Admitting: Physician Assistant

## 2019-12-21 ENCOUNTER — Ambulatory Visit (INDEPENDENT_AMBULATORY_CARE_PROVIDER_SITE_OTHER): Payer: Self-pay | Admitting: Physician Assistant

## 2019-12-21 DIAGNOSIS — M25562 Pain in left knee: Secondary | ICD-10-CM

## 2019-12-21 DIAGNOSIS — M5431 Sciatica, right side: Secondary | ICD-10-CM

## 2019-12-21 MED ORDER — DICLOFENAC SODIUM 1 % EX GEL: CUTANEOUS | 0 refills | Status: DC

## 2019-12-21 MED ORDER — MELOXICAM 15 MG PO TABS: 15.0000 mg | ORAL_TABLET | Freq: Every day | ORAL | 3 refills | Status: DC

## 2019-12-21 MED FILL — DICLOFENAC SODIUM 1% GEL: 1 | 6 days supply | Qty: 100 | Fill #0

## 2019-12-21 MED FILL — LEVOTHYROXINE 112 MCG TAB: 112 | 30 days supply | Qty: 30 | Fill #5

## 2019-12-21 NOTE — Progress Notes (Signed)
HPI: Alexandra Brewer returns today for her neck pain and radicular symptoms down the right arm.  She states she is doing somewhat better.  Feels medications help.  She states the numbness tingling down the arm and is resolved.  She still has some neck pain and rates her neck pain to be 8 out of 10 at worst.  She is having no waking pain.  Pain is worse with the end of the day and she describes this pain as a tension-like pain and then more severe pain at night and first thing in the morning.  She unfortunately did not get the meloxicam.  She has found the Flexeril to be beneficial.  One visit to physical therapy was also helpful.  Review of systems: See HPI otherwise negative  Physical exam: Cervical spine full range of motion with flexion extension without pain.  Rotation right and left causes discomfort in the right trapezius area.  She has positive Spurling's.  Tenderness right trapezius region with palpation no tenderness along medial border of the scapulas or over the cervical spine.  5 strength throughout the upper extremities against resistance.  5 out of 5 strength with external and internal rotation bilateral shoulders without pain.  Impingement testing negative bilaterally.  Sensation grossly intact bilateral hands light touch throughout.  Full motor bilateral hands.  Impression: Cervicalgia   Plan: Due to the fact that her radicular symptom have improved we would hold on MRI of the cervical spine at this point time.  We will have her continue with physical therapy.  We will send in the meloxicam before she has a refill on the Flexeril.  Also will send in Voltaren gel again for her knee.  She will follow-up with [place] in 6 weeks if she continues to have pain in her neck or develops any radicular symptoms down the right arm.  Questions were encouraged and answered at length today.  Interpreter was used throughout the exam due to the fact the patient speaks Spanish.

## 2020-01-09 ENCOUNTER — Ambulatory Visit: Payer: Self-pay | Attending: Physician Assistant

## 2020-01-09 DIAGNOSIS — G8929 Other chronic pain: Secondary | ICD-10-CM | POA: Insufficient documentation

## 2020-01-09 DIAGNOSIS — R293 Abnormal posture: Secondary | ICD-10-CM | POA: Insufficient documentation

## 2020-01-09 DIAGNOSIS — M62838 Other muscle spasm: Secondary | ICD-10-CM | POA: Insufficient documentation

## 2020-01-09 DIAGNOSIS — M545 Low back pain, unspecified: Secondary | ICD-10-CM | POA: Insufficient documentation

## 2020-01-09 DIAGNOSIS — M5412 Radiculopathy, cervical region: Secondary | ICD-10-CM | POA: Insufficient documentation

## 2020-01-09 DIAGNOSIS — M4316 Spondylolisthesis, lumbar region: Secondary | ICD-10-CM | POA: Insufficient documentation

## 2020-01-11 ENCOUNTER — Ambulatory Visit: Payer: Self-pay | Admitting: Physical Therapy

## 2020-01-11 ENCOUNTER — Other Ambulatory Visit: Payer: Self-pay

## 2020-01-11 DIAGNOSIS — M4316 Spondylolisthesis, lumbar region: Secondary | ICD-10-CM

## 2020-01-11 DIAGNOSIS — M5412 Radiculopathy, cervical region: Secondary | ICD-10-CM

## 2020-01-11 DIAGNOSIS — R293 Abnormal posture: Secondary | ICD-10-CM

## 2020-01-11 DIAGNOSIS — M62838 Other muscle spasm: Secondary | ICD-10-CM

## 2020-01-11 DIAGNOSIS — G8929 Other chronic pain: Secondary | ICD-10-CM

## 2020-01-12 NOTE — Therapy (Signed)
[place], [place], [address] Phone: [phone]   Fax:  [phone]  Physical Therapy Treatment  Patient Details  Name: Alexandra Brewer MRN: [id number] Date of Birth: [birth year] Referring Provider (PT): Abagail Kitchens PA-C    Encounter Date: 01/11/2020   PT End of Session - 01/12/20 1028    Visit Number 2    Number of Visits 12    Date for PT Re-Evaluation 01/25/20    Authorization Type CAFA 100% covered    PT Start Time 1630    PT Stop Time 1712    PT Time Calculation (min) 42 min    Activity Tolerance Patient tolerated treatment well    Behavior During Therapy [facility] for tasks assessed/performed           Past Medical History:  Diagnosis Date  . Asthma   . Constipation   . Hyperlipemia   . Sciatica   . Thyroid disease     Past Surgical History:  Procedure Laterality Date  . CATARACT EXTRACTION    . COLONOSCOPY N/A 10/10/2015   Procedure: COLONOSCOPY;  Surgeon: Daneil Dolin, MD;  [facility];  Service: Endoscopy;  Laterality: N/A;  245 - Interpreter scheduled, do NOT move    There were no vitals filed for this visit.   Subjective Assessment - 01/12/20 1021    Subjective Patient reports her neck is feeling much better. She continues to have pain in her shoulder when she uses her shoulder. She is no longer having pain running into her shoulder.    Pertinent History asthma, thyroid disease    Limitations House hold activities    How long can you sit comfortably? N/A    How long can you stand comfortably? N/A    How long can you walk comfortably? N/A    Diagnostic tests X-ray C-spine: miniimal flattening of the normal curve; mild anterior spurring    Patient Stated Goals to have less pain in her neck and arm    Currently in Pain? Yes    Pain Score 4     Pain Location Shoulder    Pain Orientation Right    Pain Descriptors / Indicators Aching    Pain Type Acute pain    Pain  Onset 1 to 4 weeks ago    Pain Frequency Intermittent    Aggravating Factors  use of the right arm    Pain Relieving Factors medication    Effect of Pain on Daily Activities difficulty working                             [facility] Adult PT Treatment/Exercise - 01/12/20 0001      Shoulder Exercises: Supine   Other Supine Exercises wand flexion and waner x10 each. Extensive eduction given for wand flexion to keep in pain free ranges.       Shoulder Exercises: Seated   Other Seated Exercises bilateral er yellow in limited range 2x10; bilateral horizontal abduction 2x10 yellow; wand flexion attmepted but patient notted anterior shoulder pain. At this time her shoulder was more closely evlauated and found to have symtoms of impingement.       Manual Therapy   Manual Therapy Joint mobilization;Soft tissue mobilization;Passive ROM    Joint Mobilization posteiror and inferior glides     Soft tissue mobilization to upper trap and cervical spiine     Passive ROM into ER and flexion; mpotion  improved with exercises                   PT Education - 01/12/20 1027    Education Details reviewed HEP and symptom management    Person(s) Educated Patient    Methods Explanation;Demonstration;Tactile cues;Verbal cues    Comprehension Verbalized understanding;Returned demonstration;Verbal cues required;Tactile cues required            PT Short Term Goals - 01/12/20 1045      PT SHORT TERM GOAL #1   Title Therapy will review FOTO scores    Baseline reviewedwith patient    Time 1    Period Weeks    Target Date 12/21/19      PT SHORT TERM GOAL #2   Title Patient will demonstrate improved grip by 10 lbs    Time 3    Period Weeks    Status New    Target Date 01/04/20      PT SHORT TERM GOAL #3   Title Patient will increase right cervical rotation by 10 degrees    Time 3    Period Weeks    Status On-going    Target Date 01/04/20      PT SHORT TERM GOAL #4   Title  Patient will report a 50% reductuion in radicular pain into her right arm    Time 3    Status On-going    Target Date 01/04/20             PT Long Term Goals - 12/14/19 1606      PT LONG TERM GOAL #1   Title patient will use her right arm for work tasks without increased radicular or cervical pain    Time 6    Period Weeks    Status New    Target Date 01/25/20      PT LONG TERM GOAL #2   Title Patient will demonstrate full cervical motion without increased ain in order to perfrom ADL's    Time 6    Period Weeks    Status New    Target Date 01/26/20      PT LONG TERM GOAL #3   Title Patient will demonstrate full pain free shoulder ROM without pain in order to perfrom work tasks    Time 6    Period Weeks    Status New    Target Date 01/26/20                 Plan - 01/12/20 0938    Clinical Impression Statement Patients neck pain has inmproved significantly. Her pain now appears to be more of a shoulder impingement. Therapy performed manual therapy on her shoulder. She had full pain free range after manual therapy. She was given exercise to keep that range. She continues to have some tightness in her upper trap but it had very little pain.    Examination-Activity Limitations Carry;Lift;Reach Overhead    Examination-Participation Restrictions Cleaning;Laundry;Shop    Stability/Clinical Decision Making Evolving/Moderate complexity    Clinical Decision Making Moderate    Rehab Potential Good    PT Frequency 2x / week    PT Duration 6 weeks    PT Treatment/Interventions ADLs/Self Care Home Management;Electrical Stimulation;Iontophoresis 4mg /ml Dexamethasone;Moist Heat;Cryotherapy;Gait training;Stair training;Functional mobility training;Therapeutic activities;Therapeutic exercise;Neuromuscular re-education;Patient/family education;Passive range of motion;Manual techniques;Taping;Dry needling    PT Next Visit Plan soft tissue mobilization to the upper trap; gente manual  traction to the neck. scpaular and postural strengthening    PT Home  Exercise Plan upper trap stretch; levator stretch, tennis ball trigger point release    Consulted and Agree with Plan of Care Patient           Patient will benefit from skilled therapeutic intervention in order to improve the following deficits and impairments:  Postural dysfunction, Pain, Impaired UE functional use, Improper body mechanics, Decreased range of motion, Increased muscle spasms, Decreased activity tolerance  Visit Diagnosis: Radiculopathy, cervical region  Other muscle spasm  Abnormal posture  Chronic low back pain without sciatica, unspecified back pain laterality  Spondylolisthesis of lumbar region     Problem List Patient Active Problem List   Diagnosis Date Noted  . Abdominal pain 03/10/2018  . Sciatica of right side 06/11/2017  . GERD (gastroesophageal reflux disease) 11/26/2016  . Right Achilles tendinitis 08/15/2016  . Hypertension 07/05/2016  . Adhesive capsulitis of right shoulder 02/21/2016  . Heel pain, bilateral 01/10/2016  . 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 History of colonic polyps   . Diverticulosis of colon without hemorrhage   . Hypothyroidism 08/10/2015  . Chronic constipation 08/05/2015  . Hyperlipidemia 08/05/2015    Carney Living PT DPT  01/12/2020, 10:47 AM  [facility] [address] [place], [place], [address] Phone: [phone]   Fax:  [phone]  Name: Bryana Froemming MRN: [id number] Date of Birth: 10/18/1955

## 2020-01-14 ENCOUNTER — Telehealth: Payer: Self-pay | Admitting: Internal Medicine

## 2020-01-14 NOTE — Telephone Encounter (Signed)
Patient inquiring about Allergies Doc/referral.

## 2020-01-19 NOTE — Telephone Encounter (Signed)
Allergy special ist called the patient 3 times in September . Patient never return call  . I called patient lvm to contact them to schedule an appointment .   Thank you  [address]  [place], [place] [address]  [phone]

## 2020-01-23 ENCOUNTER — Ambulatory Visit: Payer: Self-pay

## 2020-01-25 ENCOUNTER — Other Ambulatory Visit: Payer: Self-pay

## 2020-01-25 ENCOUNTER — Ambulatory Visit: Payer: Self-pay | Admitting: Physical Therapy

## 2020-01-25 ENCOUNTER — Encounter: Payer: Self-pay | Admitting: Physical Therapy

## 2020-01-25 DIAGNOSIS — M4316 Spondylolisthesis, lumbar region: Secondary | ICD-10-CM

## 2020-01-25 DIAGNOSIS — M5412 Radiculopathy, cervical region: Secondary | ICD-10-CM

## 2020-01-25 DIAGNOSIS — M62838 Other muscle spasm: Secondary | ICD-10-CM

## 2020-01-25 DIAGNOSIS — R293 Abnormal posture: Secondary | ICD-10-CM

## 2020-01-25 DIAGNOSIS — G8929 Other chronic pain: Secondary | ICD-10-CM

## 2020-01-25 DIAGNOSIS — M545 Low back pain, unspecified: Secondary | ICD-10-CM

## 2020-01-26 ENCOUNTER — Encounter: Payer: Self-pay | Admitting: Physical Therapy

## 2020-01-26 NOTE — Therapy (Signed)
[place], [place], [address] Phone: [phone]   Fax:  [phone]  Physical Therapy Treatment/Discharge   Patient Details  Name: Alexandra Brewer MRN: [id number] Date of Birth: 1955-06-15 Referring Provider (PT): Abagail Kitchens PA-C    Encounter Date: 01/25/2020   PT End of Session - 01/25/20 1734    Visit Number 3    Number of Visits 12    Date for PT Re-Evaluation 01/25/20    Authorization Type CAFA 100% covered    PT Start Time 1630    PT Stop Time 1714    PT Time Calculation (min) 44 min    Activity Tolerance Patient tolerated treatment well    Behavior During Therapy [facility] for tasks assessed/performed           Past Medical History:  Diagnosis Date   Asthma    Constipation    Hyperlipemia    Sciatica    Thyroid disease     Past Surgical History:  Procedure Laterality Date   CATARACT EXTRACTION     COLONOSCOPY N/A 10/10/2015   Procedure: COLONOSCOPY;  Surgeon: Daneil Dolin, MD;  [facility];  Service: Endoscopy;  Laterality: N/A;  245 - Interpreter scheduled, do NOT move    There were no vitals filed for this visit.   Subjective Assessment - 01/25/20 1654    Subjective Pt reports her neck/shoulder feeling better; her back is hurting more today    Patient is accompained by: Interpreter   Zigmund Gottron [phone]   Pertinent History asthma, thyroid disease    Limitations House hold activities    How long can you sit comfortably? N/A    How long can you stand comfortably? N/A    How long can you walk comfortably? N/A    Diagnostic tests X-ray C-spine: miniimal flattening of the normal curve; mild anterior spurring    Patient Stated Goals to have less pain in her neck and arm    Currently in Pain? Yes    Pain Score 4     Pain Location Back    Pain Orientation Right;Left    Pain Descriptors / Indicators Aching    Pain Type Acute pain    Pain Onset 1 to 4 weeks ago     Pain Frequency Intermittent                             [facility] Adult PT Treatment/Exercise - 01/26/20 0001      Lumbar Exercises: Stretches   Lower Trunk Rotation 5 reps   pain free range   Other Lumbar Stretch Exercise Self-HEP and education on useage of tennis ball to massage trigger points in hip and lumbar spine, as well as neck and upper trap      Lumbar Exercises: Supine   Ab Set 2 seconds;5 reps   preparation for bent knee raises   Clam 20 reps   red theraband   Bent Knee Raise 5 reps;2 seconds   bilateral, with ab contraction     Knee/Hip Exercises: Stretches   Active Hamstring Stretch Both;3 reps;30 seconds   Seated hamstring stretch   Piriformis Stretch Both;2 reps;30 seconds   Supine     Manual Therapy   Joint Mobilization LAD bil grade I & II                  PT Education - 01/25/20 1733    Education Details  updated and reviewed HEP and modifications for stretches while at work    [employer]) Educated Patient    Methods Explanation;Handout    Comprehension Verbalized understanding            PT Short Term Goals - 01/25/20 1738      PT SHORT TERM GOAL #1   Title Therapy will review FOTO scores    Baseline reviewedwith patient    Time 1    Period Weeks    Status Achieved    Target Date 12/21/19      PT SHORT TERM GOAL #2   Title Patient will demonstrate improved grip by 10 lbs    Baseline not assessed but improved    Time 3    Period Weeks    Status Achieved    Target Date 01/04/20      PT SHORT TERM GOAL #3   Title Patient will increase right cervical rotation by 10 degrees    Baseline per visual inspection full range of motion    Time 3    Period Weeks    Status Achieved    Target Date 01/04/20      PT SHORT TERM GOAL #4   Title Patient will report a 50% reductuion in radicular pain into her right arm    Baseline No radicular pain    Time 3    Status Achieved    Target Date 01/04/20             PT Long Term  Goals - 01/26/20 0828      PT LONG TERM GOAL #1   Title patient will use her right arm for work tasks without increased radicular or cervical pain    Baseline not havinbg neck or shoulder pain with  work    Time 6    Period Weeks    Status Achieved      PT LONG TERM GOAL #2   Title Patient will demonstrate full cervical motion without increased ain in order to perfrom ADL's    Baseline dmeonstrated full cervical motion during treatment    Time 6    Period Weeks    Status Achieved      PT LONG TERM GOAL #3   Title Patient will demonstrate full pain free shoulder ROM without pain in order to perfrom work tasks    Baseline full shoulder ROM    Time 6    Period Weeks    Status On-going                 Plan - 01/25/20 1734    Clinical Impression Statement Pt's neck and shoulder have improved and the focus of today's visit was on their lower back. Manual examation of the lumbar paraspinals and gluteal muscles was performed and these locations were tender to palpation so the patient was educated on the self-massage with the tennis ball exercise. Patient was given a HEP focusing on the lumbar spine and LE, including LTRs, supine clamshells with red theraband, supine marches, piriformis stretch, and seated hamstring stretch. Patient is discharged due to acheivement of goals. Patient continues to have lumbar pain but she feels like she can handle her current pain with her HEP and exercises. Patient given lumbar stretches and exercises as part of a total posture and work endurance prgram.    Examination-Activity Limitations Carry;Lift;Reach Overhead    Examination-Participation Restrictions Cleaning;Laundry;Shop    Stability/Clinical Decision Making Evolving/Moderate complexity    Clinical Decision Making Moderate  Rehab Potential Good    PT Treatment/Interventions ADLs/Self Care Home Management;Electrical Stimulation;Iontophoresis 76m/ml Dexamethasone;Moist Heat;Cryotherapy;Gait  training;Stair training;Functional mobility training;Therapeutic activities;Therapeutic exercise;Neuromuscular re-education;Patient/family education;Passive range of motion;Manual techniques;Taping;Dry needling    PT Next Visit Plan Discharge    PT Home Exercise Plan Continue with cervical, shoulder, and lumbar HEPs    Consulted and Agree with Plan of Care Patient           Patient will benefit from skilled therapeutic intervention in order to improve the following deficits and impairments:  Postural dysfunction, Pain, Impaired UE functional use, Improper body mechanics, Decreased range of motion, Increased muscle spasms, Decreased activity tolerance  Visit Diagnosis: Radiculopathy, cervical region  Spondylolisthesis of lumbar region  Chronic low back pain without sciatica, unspecified back pain laterality  Abnormal posture  Other muscle spasm  PHYSICAL THERAPY DISCHARGE SUMMARY  Visits from Start of Care: 3  Current functional level related to goals / functional outcomes: Improved neck and shoulder pain with dialy activity   Remaining deficits: Low back pain   Education / Equipment: HEP   Plan: Patient agrees to discharge.  Patient goals were met. Patient is being discharged due to meeting the stated rehab goals.  ?????       Problem List Patient Active Problem List   Diagnosis Date Noted   Abdominal pain 03/10/2018   Sciatica of right side 06/11/2017   GERD (gastroesophageal reflux disease) 11/26/2016   Right Achilles tendinitis 08/15/2016   Hypertension 07/05/2016   Adhesive capsulitis of right shoulder 02/21/2016   Heel pain, bilateral 01/10/2016   Varicose veins of left lower extremity with pain 01/10/2016   History of colonic polyps    Diverticulosis of colon without hemorrhage    Hypothyroidism 08/10/2015   Chronic constipation 08/05/2015   Hyperlipidemia 08/05/2015    DCarney Living10/26/2021, 8:31 AM  [facility] [address]Phone: [phone]  Fax:  [phone] Name: SDaleisa HalperinMRN: [id number] of Birth: 1Jun 23, 1957

## 2020-01-26 NOTE — Therapy (Deleted)
[place], [place], [address] Phone: [phone]   Fax:  [phone]  Physical Therapy Treatment / Discharge  Patient Details  Name: Alexandra Brewer MRN: [id number] Date of Birth: July 04, 1955 Referring Provider (PT): Abagail Kitchens PA-C    Encounter Date: 01/25/2020   PT End of Session - 01/25/20 1734    Visit Number 3    Number of Visits 12    Date for PT Re-Evaluation 01/25/20    Authorization Type CAFA 100% covered    PT Start Time 1630    PT Stop Time 1714    PT Time Calculation (min) 44 min    Activity Tolerance Patient tolerated treatment well    Behavior During Therapy [facility] for tasks assessed/performed           Past Medical History:  Diagnosis Date  . Asthma   . Constipation   . Hyperlipemia   . Sciatica   . Thyroid disease     Past Surgical History:  Procedure Laterality Date  . CATARACT EXTRACTION    . COLONOSCOPY N/A 10/10/2015   Procedure: COLONOSCOPY;  Surgeon: Daneil Dolin, MD;  [facility];  Service: Endoscopy;  Laterality: N/A;  245 - Interpreter scheduled, do NOT move    There were no vitals filed for this visit.   Subjective Assessment - 01/25/20 1654    Subjective Pt reports her neck/shoulder feeling better; her back is hurting more today    Patient is accompained by: Interpreter   Zigmund Gottron [phone]   Pertinent History asthma, thyroid disease    Limitations House hold activities    How long can you sit comfortably? N/A    How long can you stand comfortably? N/A    How long can you walk comfortably? N/A    Diagnostic tests X-ray C-spine: miniimal flattening of the normal curve; mild anterior spurring    Patient Stated Goals to have less pain in her neck and arm    Currently in Pain? Yes    Pain Score 4     Pain Location Back    Pain Orientation Right;Left    Pain Descriptors / Indicators Aching    Pain Type Acute pain    Pain Onset 1 to 4 weeks ago      Pain Frequency Intermittent                                     PT Education - 01/25/20 1733    Education Details updated and reviewed HEP and modifications for stretches while at work    [employer]) Educated Patient    Methods Explanation;Handout    Comprehension Verbalized understanding            PT Short Term Goals - 01/25/20 1738      PT SHORT TERM GOAL #1   Title Therapy will review FOTO scores    Baseline reviewedwith patient    Time 1    Period Weeks    Target Date 12/21/19      PT SHORT TERM GOAL #2   Title Patient will demonstrate improved grip by 10 lbs    Time 3    Period Weeks    Status New    Target Date 01/04/20      PT SHORT TERM GOAL #3   Title Patient will increase right cervical rotation by 10 degrees    Time 3  Period Weeks    Status On-going    Target Date 01/04/20      PT SHORT TERM GOAL #4   Title Patient will report a 50% reductuion in radicular pain into her right arm    Time 3    Status On-going    Target Date 01/04/20             PT Long Term Goals - 12/14/19 1606      PT LONG TERM GOAL #1   Title patient will use her right arm for work tasks without increased radicular or cervical pain    Time 6    Period Weeks    Status New    Target Date 01/25/20      PT LONG TERM GOAL #2   Title Patient will demonstrate full cervical motion without increased ain in order to perfrom ADL's    Time 6    Period Weeks    Status New    Target Date 01/26/20      PT LONG TERM GOAL #3   Title Patient will demonstrate full pain free shoulder ROM without pain in order to perfrom work tasks    Time 6    Period Weeks    Status New    Target Date 01/26/20                 Plan - 01/25/20 1734    Clinical Impression Statement Pt's neck and shoulder have improved and the focus of today's visit was on their lower back. Manual examation of the lumbar paraspinals and gluteal muscles was performed and these  locations were tender to palpation so the patient was educated on the self-massage with the tennis ball exercise. Patient was given a HEP focusing on the lumbar spine and LE, including LTRs, supine clamshells with red theraband, supine marches, piriformis stretch, and seated hamstring stretch. Patient is discharged due to acheivement of goals. Patient continues to have lumbar pain but she feels like she can handle her current pain with her HEP and exercises.    Examination-Activity Limitations Carry;Lift;Reach Overhead    Examination-Participation Restrictions Cleaning;Laundry;Shop    Stability/Clinical Decision Making Evolving/Moderate complexity    Clinical Decision Making Moderate    Rehab Potential Good    PT Treatment/Interventions ADLs/Self Care Home Management;Electrical Stimulation;Iontophoresis 4mg /ml Dexamethasone;Moist Heat;Cryotherapy;Gait training;Stair training;Functional mobility training;Therapeutic activities;Therapeutic exercise;Neuromuscular re-education;Patient/family education;Passive range of motion;Manual techniques;Taping;Dry needling    PT Next Visit Plan Discharge    PT Home Exercise Plan Continue with cervical, shoulder, and lumbar HEPs    Consulted and Agree with Plan of Care Patient           Patient will benefit from skilled therapeutic intervention in order to improve the following deficits and impairments:  Postural dysfunction, Pain, Impaired UE functional use, Improper body mechanics, Decreased range of motion, Increased muscle spasms, Decreased activity tolerance  Visit Diagnosis: Radiculopathy, cervical region  Spondylolisthesis of lumbar region  Chronic low back pain without sciatica, unspecified back pain laterality  Abnormal posture  Other muscle spasm     Problem List Patient Active Problem List   Diagnosis Date Noted  . Abdominal pain 03/10/2018  . Sciatica of right side 06/11/2017  . GERD (gastroesophageal reflux disease) 11/26/2016  .  Right Achilles tendinitis 08/15/2016  . Hypertension 07/05/2016  . Adhesive capsulitis of right shoulder 02/21/2016  . Heel pain, bilateral 01/10/2016  . 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 History of colonic polyps   .  Diverticulosis of colon without hemorrhage   . Hypothyroidism 08/10/2015  . Chronic constipation 08/05/2015  . Hyperlipidemia 08/05/2015    Dawayne Cirri 01/26/2020, 8:26 AM  [facility] [address] [place], [place], [address] Phone: [phone]   Fax:  [phone]  Name: Alexandra Brewer MRN: [id number] Date of Birth: 1955/10/26

## 2020-01-27 MED FILL — ?AMLODIPINE BESYLATE 5MG TA: 5 | 30 days supply | Qty: 30 | Fill #3

## 2020-01-27 MED FILL — FLUTICASONE PROP 50 MCG SPR: 50 | 30 days supply | Qty: 16 | Fill #2

## 2020-01-27 MED FILL — LEVOTHYROXINE 112 MCG TAB: 112 | 30 days supply | Qty: 30 | Fill #6

## 2020-01-29 ENCOUNTER — Ambulatory Visit: Payer: Self-pay | Attending: Family Medicine | Admitting: Family Medicine

## 2020-01-29 ENCOUNTER — Encounter: Payer: Self-pay | Admitting: Family Medicine

## 2020-01-29 ENCOUNTER — Other Ambulatory Visit: Payer: Self-pay

## 2020-01-29 DIAGNOSIS — J4521 Mild intermittent asthma with (acute) exacerbation: Secondary | ICD-10-CM

## 2020-01-29 DIAGNOSIS — R519 Headache, unspecified: Secondary | ICD-10-CM

## 2020-01-29 DIAGNOSIS — Z789 Other specified health status: Secondary | ICD-10-CM

## 2020-01-29 DIAGNOSIS — Z758 Other problems related to medical facilities and other health care: Secondary | ICD-10-CM

## 2020-01-29 DIAGNOSIS — Z603 Acculturation difficulty: Secondary | ICD-10-CM

## 2020-01-29 DIAGNOSIS — J3089 Other allergic rhinitis: Secondary | ICD-10-CM

## 2020-01-29 MED ORDER — PREDNISONE 20 MG PO TABS: ORAL_TABLET | ORAL | 0 refills | Status: DC

## 2020-01-29 NOTE — Progress Notes (Signed)
Have not rcvd referral to allergy specialist  Feels allergy meds not working Head hurts all over when it does hurt wakes up with headaches

## 2020-01-29 NOTE — Progress Notes (Signed)
Virtual Visit via Telephone Note  I connected with Alexandra Brewer on 01/29/20 at  3:50 PM EDT by telephone and verified that I am speaking with the correct person using two identifiers.   I discussed the limitations, risks, security and privacy concerns of performing an evaluation and management service by telephone and the availability of in person appointments. I also discussed with the patient that there may be a patient responsible charge related to this service. The patient expressed understanding and agreed to proceed.  Patient Location: Home Provider Location: [facility] Others participating in call: call initiated by Elmon Else, [place] who obtained a Spanish speaking interpreter through [place]   History of Present Illness:       64 year old female who is a patient of Dr. Wynetta Emery, who has a complaint of recurrent frontal headache especially over the past 2 months.  She does have a history of allergic rhinitis but reports that Claritin and cetirizine have not really helped with her nasal congestion and postnasal drainage.  She does believe that cetirizine works better than Claritin.  She would like a referral to see an allergist.  She reports that there is a medication she takes, meloxicam, which was prescribed by her chiropractor, which does relieve her headache and after taking the meloxicam she does not have any headaches or body aches for a few days.          She also reports that she has had some recent increase in shortness of breath and sensation of wheezing related to her asthma.  She has not had any fever or chills.  No productive cough.  She does have inhalers.  Past Medical History:  Diagnosis Date  . Asthma   . Constipation   . Hyperlipemia   . Sciatica   . Thyroid disease     Past Surgical History:  Procedure Laterality Date  . CATARACT EXTRACTION    . COLONOSCOPY N/A 10/10/2015   Procedure: COLONOSCOPY;  Surgeon: Daneil Dolin, MD;   [facility];  Service: Endoscopy;  Laterality: N/A;  245 - Interpreter scheduled, do NOT move    Family History  Problem Relation Age of Onset  . Diabetes Father   . Hypertension Father   . Asthma Father   . Stroke Maternal Grandmother   . Stroke Maternal Grandfather   . Colon cancer Neg Hx     Social History   Tobacco Use  . Smoking status: Never Smoker  . Smokeless tobacco: Never Used  Vaping Use  . Vaping Use: Never used  Substance Use Topics  . Alcohol use: No    Alcohol/week: 0.0 standard drinks  . Drug use: No         Observations/Objective: No vital signs or physical exam conducted as visit was done via telephone  Assessment and Plan: 1. Frontal headache 2. Allergic rhinitis due to other allergic trigger, unspecified seasonality She reports recurrent frontal headaches which are likely related to her issues with allergic rhinitis.  She is encouraged to continue the cetirizine as she states that this works better than loratadine.  She may also wish to try over-the-counter Xyzal as she states that over-the-counter Allegra has been tried in the past and did not work.  She will be referred to an allergist for further evaluation and treatment. - Ambulatory referral to Allergy  3. Mild intermittent asthma with exacerbation Reports a recent increase in shortness of breath and wheezing related to her asthma.  Prescription will be  sent to pharmacy for prednisone.  Offered to send prednisone to a local pharmacy as [facility] is closed for the weekend however patient states that she believes she can wait until Monday to pick up the prednisone from the pharmacy.  She is encouraged to continue the use of her inhalers. - predniSONE (DELTASONE) 20 MG tablet; Two pills once per day x 5 days for wheezing; take after eating  Dispense: 10 tablet; Refill: 0  4.  Language barrier [employer] was used to help with language barrier  Follow Up  Instructions:Return in about 2 months (around 03/30/2020) for chronic issues-f/u with PCP but sooner if needed.    I discussed the assessment and treatment plan with the patient. The patient was provided an opportunity to ask questions and all were answered. The patient agreed with the plan and demonstrated an understanding of the instructions.   The patient was advised to call back or seek an in-person evaluation if the symptoms worsen or if the condition fails to improve as anticipated.  I provided 12 minutes of non-face-to-face time during this encounter.   Antony Blackbird, MD

## 2020-02-01 MED FILL — ?PREDNISONE 20MG TABLET: 20 | 5 days supply | Qty: 10 | Fill #0

## 2020-02-02 ENCOUNTER — Ambulatory Visit (INDEPENDENT_AMBULATORY_CARE_PROVIDER_SITE_OTHER): Payer: Self-pay | Admitting: Internal Medicine

## 2020-02-02 ENCOUNTER — Encounter: Payer: Self-pay | Admitting: Internal Medicine

## 2020-02-02 ENCOUNTER — Other Ambulatory Visit: Payer: Self-pay

## 2020-02-02 VITALS — BP 159/73 | HR 68 | Temp 97.1°F | Ht 60.0 in | Wt 162.0 lb

## 2020-02-02 DIAGNOSIS — R1319 Other dysphagia: Secondary | ICD-10-CM

## 2020-02-02 DIAGNOSIS — K5909 Other constipation: Secondary | ICD-10-CM

## 2020-02-02 DIAGNOSIS — K219 Gastro-esophageal reflux disease without esophagitis: Secondary | ICD-10-CM

## 2020-02-02 MED ORDER — LINACLOTIDE 145 MCG PO CAPS: 145.0000 ug | ORAL_CAPSULE | Freq: Every day | ORAL | 11 refills | Status: DC

## 2020-02-02 MED ORDER — PANTOPRAZOLE SODIUM 40 MG PO TBEC
40.0000 mg | DELAYED_RELEASE_TABLET | Freq: Every day | ORAL | 11 refills | Status: DC
Start: 2020-02-02 — End: 2020-02-02

## 2020-02-02 MED FILL — PANTOPRAZOLE SOD DR 40 MG T: 40 | 30 days supply | Qty: 30 | Fill #0

## 2020-02-02 MED FILL — LINZESS 145 MCG CAPSULE: 145 | 30 days supply | Qty: 30 | Fill #0

## 2020-02-02 NOTE — Patient Instructions (Signed)
GERD and constipation information provided  Begin Protonix 40 mg once daily (disp 30 with 11 refills)  Begin Linzess 145 - 1 capsule daily - samples provided ( and disp 30 with 11 refills)  Continue Benefiber but limit to 2 tablespoons once daily  Office visit in 6 weeks - if food still sticking at that time, will need an upper endoscopy to get esophagus stretched

## 2020-02-02 NOTE — Progress Notes (Signed)
Primary Care Physician:  Ladell Pier, MD Primary Gastroenterologist:  Dr. Gala Romney  Pre-Procedure History & Physical: HPI:  Alexandra Brewer is a 64 y.o. female here for further evaluation of chronic constipation.  May go upwards of a week without having a bowel movement.  Colonoscopy back in 2017 demonstrated benign polyps; due for average risk screening 2027.  Started on Linzess 290 at that time;  Fairly rapidly, bowel function reverted to normal and then diarrhea.  She stopped Linzess.  She is currently taking 2 tablespoons of Benefiber twice daily without much improvement.  Has not used MiraLAX.  She has had no rectal bleeding.  In addition, patient states she is having significant reflux symptoms.  Ran out of omeprazole months ago;  prescribed elsewhere.  Having reflux symptoms multiple times daily.  Occasionally has symptoms of vague esophageal dysphagia.  No prior EGD.  No alarm symptoms otherwise. Past Medical History:  Diagnosis Date  . Asthma   . Constipation   . Hyperlipemia   . Sciatica   . Thyroid disease     Past Surgical History:  Procedure Laterality Date  . CATARACT EXTRACTION    . COLONOSCOPY N/A 10/10/2015   Procedure: COLONOSCOPY;  Surgeon: Daneil Dolin, MD;  [facility];  Service: Endoscopy;  Laterality: N/A;  245 - Interpreter scheduled, do NOT move    Prior to Admission medications   Medication Sig Start Date End Date Taking? Authorizing Provider  albuterol (VENTOLIN HFA) 108 (90 Base) MCG/ACT inhaler Inhale 2 puffs into the lungs every 6 (six) hours as needed for wheezing or shortness of breath. 03/06/19  Yes Ladell Pier, MD  amLODipine (NORVASC) 5 MG tablet Take 1 tablet (5 mg total) by mouth daily. 07/06/19  Yes Ladell Pier, MD  cetirizine (ZYRTEC ALLERGY) 10 MG tablet Take 1 tablet (10 mg total) by mouth daily. 08/25/19  Yes Darr, Edison Nasuti, PA-C  diclofenac Sodium (VOLTAREN) 1 % GEL APPLY 4 GRAMS TOPICALLY 4 TIMES DAILY. FOR JOINT  PAIN 12/21/19  Yes Pete Pelt, PA-C  fluticasone [facility]) 50 MCG/ACT nasal spray Place 2 sprays into both nostrils daily as needed for allergies or rhinitis. 07/06/19  Yes Ladell Pier, MD  levothyroxine (SYNTHROID) 112 MCG tablet Take 1 tablet (112 mcg total) by mouth daily. 07/06/19  Yes Ladell Pier, MD  loratadine (CLARITIN) 10 MG tablet Take 1 tablet (10 mg total) by mouth daily. 03/06/19  Yes Ladell Pier, MD  meloxicam (MOBIC) 15 MG tablet Take 1 tablet (15 mg total) by mouth daily. 12/21/19  Yes Pete Pelt, PA-C  Wheat Dextrin (BENEFIBER) POWD Take by mouth 2 (two) times daily. Takes 2 tbsp twice daily   Yes [provider]    Allergies as of 02/02/2020 - Review Complete 02/02/2020  Allergen Reaction Noted  . Naproxen Rash 06/29/2019  . Fish allergy  06/11/2017  . Penicillins  07/20/2015  . Shellfish allergy  06/11/2017    Family History  Problem Relation Age of Onset  . Diabetes Father   . Hypertension Father   . Asthma Father   . Stroke Maternal Grandmother   . Stroke Maternal Grandfather   . Colon cancer Neg Hx     Social History   Socioeconomic History  . Marital status: Single    Spouse name: Not on file  . Number of children: Not on file  . Years of education: Not on file  . Highest education level: Not on file  Occupational History  . Not on file  Tobacco Use  . Smoking status: Never Smoker  . Smokeless tobacco: Never Used  Vaping Use  . Vaping Use: Never used  Substance and Sexual Activity  . Alcohol use: No    Alcohol/week: 0.0 standard drinks  . Drug use: No  . Sexual activity: Not Currently  Other Topics Concern  . Not on file  Social History Narrative  . Not on file   Social Determinants of Health   Financial Resource Strain:   . Difficulty of Paying Living Expenses: Not on file  Food Insecurity:   . Worried About Charity fundraiser in the Last Year: Not on file  . Ran Out of Food in the Last Year: Not on  file  Transportation Needs:   . Lack of Transportation (Medical): Not on file  . Lack of Transportation (Non-Medical): Not on file  Physical Activity:   . Days of Exercise per Week: Not on file  . Minutes of Exercise per Session: Not on file  Stress:   . Feeling of Stress : Not on file  Social Connections:   . Frequency of Communication with Friends and Family: Not on file  . Frequency of Social Gatherings with Friends and Family: Not on file  . Attends Religious Services: Not on file  . Active Member of Clubs or Organizations: Not on file  . Attends Archivist Meetings: Not on file  . Marital Status: Not on file  Intimate Partner Violence:   . Fear of Current or Ex-Partner: Not on file  . Emotionally Abused: Not on file  . Physically Abused: Not on file  . Sexually Abused: Not on file    Review of Systems: See HPI, otherwise negative ROS  Physical Exam: BP (!) 159/73   Pulse 68   Temp (!) 97.1 F (36.2 C) (Temporal)   Ht 5' (1.524 m)   Wt 162 lb (73.5 kg)   BMI 31.64 kg/m  General:   Alert,  Well-developed, well-nourished, pleasant and cooperative in NAD SNeck:  Supple; no masses or thyromegaly. No significant cervical adenopathy. Lungs:  Clear throughout to auscultation.   No wheezes, crackles, or rhonchi. No acute distress. Heart:  Regular rate and rhythm; no murmurs, clicks, rubs,  or gallops. Abdomen: Non-distended, normal bowel sounds.  Soft and nontender without appreciable mass or hepatosplenomegaly.  Pulses:  Normal pulses noted. Extremities:  Without clubbing or edema.  Impression/Plan: Pleasant 64 year old lady with chronic constipation.  She responded to Linzess but endpoint was overshot with the dose of 290 mcg.  Recent colonoscopy reassuring.  Poorly controlled GERD without any medical regimen at this time.  She describes a vague esophageal dysphagia.  Recommendations:  GERD and constipation information provided  Begin Protonix 40 mg once  daily (disp 30 with 11 refills)  Begin Linzess 145 - 1 capsule daily - samples provided ( and disp 30 with 11 refills)  Continue Benefiber but limit to 2 tablespoons once daily  Office visit in 6 weeks - if food still sticking at that time, will need an EGD to further evaluate/treat.   Notice: This dictation was prepared with Dragon dictation along with smaller phrase technology. Any transcriptional errors that result from this process are unintentional and may not be corrected upon review.

## 2020-02-08 ENCOUNTER — Ambulatory Visit: Payer: Self-pay | Attending: Internal Medicine

## 2020-02-08 ENCOUNTER — Other Ambulatory Visit: Payer: Self-pay

## 2020-02-15 ENCOUNTER — Other Ambulatory Visit: Payer: Self-pay

## 2020-02-15 ENCOUNTER — Ambulatory Visit: Payer: Self-pay | Attending: Internal Medicine

## 2020-02-23 ENCOUNTER — Other Ambulatory Visit: Payer: Self-pay

## 2020-02-23 ENCOUNTER — Ambulatory Visit (INDEPENDENT_AMBULATORY_CARE_PROVIDER_SITE_OTHER): Payer: Self-pay

## 2020-02-23 ENCOUNTER — Encounter (HOSPITAL_COMMUNITY): Payer: Self-pay

## 2020-02-23 ENCOUNTER — Ambulatory Visit (HOSPITAL_COMMUNITY)
Admission: EM | Admit: 2020-02-23 | Discharge: 2020-02-23 | Disposition: A | Discharge status: Home / Self Care | Payer: Self-pay | Attending: Urgent Care | Admitting: Urgent Care

## 2020-02-23 DIAGNOSIS — S92901A Unspecified fracture of right foot, initial encounter for closed fracture: Secondary | ICD-10-CM

## 2020-02-23 DIAGNOSIS — R2241 Localized swelling, mass and lump, right lower limb: Secondary | ICD-10-CM

## 2020-02-23 DIAGNOSIS — M79671 Pain in right foot: Secondary | ICD-10-CM

## 2020-02-23 MED ORDER — ACETAMINOPHEN 325 MG PO TABS
ORAL_TABLET | ORAL | Status: AC
  Filled 2020-02-23: qty 2

## 2020-02-23 MED ORDER — ACETAMINOPHEN 325 MG PO TABS
650.0000 mg | ORAL_TABLET | Freq: Once | ORAL | Status: AC
  Administered 2020-02-23: 650 mg via ORAL

## 2020-02-23 MED ORDER — HYDROCODONE-ACETAMINOPHEN 5-325 MG PO TABS: 1.0000 | ORAL_TABLET | Freq: Four times a day (QID) | ORAL | 0 refills | Status: DC | PRN

## 2020-02-23 NOTE — ED Provider Notes (Signed)
[place]   MRN: [id number] DOB: 10/04/1955  Subjective:   Alexandra Brewer is a 64 y.o. female presenting for suffering a right foot injury while at work today.  Patient states that she had something tied to her foot which is a normal practice for.  Unfortunately she tripped over and bit her foot very awkwardly.  She has since had significant pain over the top of her right foot with swelling.  Has not taken any medications for relief.  Has a history of allergies to naproxen.  She is okay to take meloxicam.  Can also do Tylenol.  Denies significant difficulty bearing weight on her right foot but is able to bear some weight.  No current facility-administered medications for this encounter.  Current Outpatient Medications:  .  albuterol (VENTOLIN HFA) 108 (90 Base) MCG/ACT inhaler, Inhale 2 puffs into the lungs every 6 (six) hours as needed for wheezing or shortness of breath., Disp: 8 g, Rfl: 12 .  amLODipine (NORVASC) 5 MG tablet, Take 1 tablet (5 mg total) by mouth daily., Disp: 30 tablet, Rfl: 6 .  cetirizine (ZYRTEC ALLERGY) 10 MG tablet, Take 1 tablet (10 mg total) by mouth daily., Disp: 30 tablet, Rfl: 0 .  diclofenac Sodium (VOLTAREN) 1 % GEL, APPLY 4 GRAMS TOPICALLY 4 TIMES DAILY. FOR JOINT PAIN, Disp: 100 g, Rfl: 0 .  fluticasone (FLONASE) 50 MCG/ACT nasal spray, Place 2 sprays into both nostrils daily as needed for allergies or rhinitis., Disp: 16 g, Rfl: 6 .  levothyroxine (SYNTHROID) 112 MCG tablet, Take 1 tablet (112 mcg total) by mouth daily., Disp: 30 tablet, Rfl: 6 .  linaclotide (LINZESS) 145 MCG CAPS capsule, Take 1 capsule (145 mcg total) by mouth daily before breakfast., Disp: 30 capsule, Rfl: 11 .  loratadine (CLARITIN) 10 MG tablet, Take 1 tablet (10 mg total) by mouth daily., Disp: 30 tablet, Rfl: 3 .  meloxicam (MOBIC) 15 MG tablet, Take 1 tablet (15 mg total) by mouth daily., Disp: 30 tablet, Rfl: 3 .  pantoprazole (PROTONIX) 40 MG tablet, Take 1  tablet (40 mg total) by mouth daily., Disp: 30 tablet, Rfl: 11 .  Wheat Dextrin (BENEFIBER) POWD, Take by mouth 2 (two) times daily. Takes 2 tbsp twice daily, Disp: , Rfl:    Allergies to naproxen, fish and shellfish allergy, penicillins.  Past Medical History:  Diagnosis Date  . Asthma   . Constipation   . Hyperlipemia   . Sciatica   . Thyroid disease      Past Surgical History:  Procedure Laterality Date  . CATARACT EXTRACTION    . COLONOSCOPY N/A 10/10/2015   Procedure: COLONOSCOPY;  Surgeon: Daneil Dolin, MD;  [facility];  Service: Endoscopy;  Laterality: N/A;  245 - Interpreter scheduled, do NOT move    Family History  Problem Relation Age of Onset  . Diabetes Father   . Hypertension Father   . Asthma Father   . Stroke Maternal Grandmother   . Stroke Maternal Grandfather   . Colon cancer Neg Hx     Social History   Tobacco Use  . Smoking status: Never Smoker  . Smokeless tobacco: Never Used  Vaping Use  . Vaping Use: Never used  Substance Use Topics  . Alcohol use: No    Alcohol/week: 0.0 standard drinks  . Drug use: No    ROS   Objective:   Vitals: BP (!) 143/76 (BP Location: Right Arm)   Pulse 76  Temp 98.3 F (36.8 C) (Oral)   Resp 16   SpO2 100%   Physical Exam Constitutional:      General: She is not in acute distress.    Appearance: Normal appearance. She is well-developed. She is not ill-appearing, toxic-appearing or diaphoretic.  HENT:     Head: Normocephalic and atraumatic.     Nose: Nose normal.     Mouth/Throat:     Mouth: Mucous membranes are moist.     Pharynx: Oropharynx is clear.  Eyes:     General: No scleral icterus.       Right eye: No discharge.        Left eye: No discharge.     Extraocular Movements: Extraocular movements intact.     Conjunctiva/sclera: Conjunctivae normal.     Pupils: Pupils are equal, round, and reactive to light.  Cardiovascular:     Rate and Rhythm: Normal rate.  Pulmonary:      Effort: Pulmonary effort is normal.  Musculoskeletal:     Right foot: Decreased range of motion. Normal capillary refill. Swelling, deformity, tenderness and bony tenderness present. No laceration or crepitus.       Feet:  Skin:    General: Skin is warm and dry.  Neurological:     General: No focal deficit present.     Mental Status: She is alert and oriented to person, place, and time.     Motor: No weakness.     Coordination: Coordination normal.     Gait: Gait normal.     Deep Tendon Reflexes: Reflexes normal.  Psychiatric:        Mood and Affect: Mood normal.        Behavior: Behavior normal.        Thought Content: Thought content normal.        Judgment: Judgment normal.     DG Foot Complete Right  Result Date: 02/23/2020 CLINICAL DATA:  Right foot injury and pain EXAM: RIGHT FOOT COMPLETE - 3+ VIEW COMPARISON:  05/16/2016 FINDINGS: Dorsal navicular avulsion fracture with regional soft tissue swelling. Slight irregularity of the dorsal talar neck could be from avulsion fracture or obliquity. No subluxation. IMPRESSION: Dorsal avulsion fracture at the navicular and possibly also at the talar neck. Electronically Signed   By: Monte Fantasia M.D.   On: 02/23/2020 18:27   Assessment and Plan :   PDMP not reviewed this encounter.  1. Foot fracture, right, closed, initial encounter   2. Foot pain, right   3. Localized swelling of right foot     Recommended postop shoe, she was fitted with this.  Patient requested crutches as well.  Schedule meloxicam, hydrocodone for breakthrough pain.  Follow-up with podiatrist or orthopedist. Counseled patient on potential for adverse effects with medications prescribed/recommended today, ER and return-to-clinic precautions discussed, patient verbalized understanding.    Jaynee Eagles, PA-C 02/23/20 1850

## 2020-02-23 NOTE — Discharge Instructions (Addendum)
Puede usar meloxicam para su dolor. Si todavia tiene dolor aun que esta tomando meloxicam, puede usar hydrocodone. Es un medicamento potente entonces si no lo ocupa no lo use. Llamele a un ortopedico para una consulta para su pie.   [place] [facility] [facility] in [place], [place]: [email or web address] Get online care: [email or web address] Address: [address], [place], [place] [address] Phone: [phone] Appointments: [email or web address]   [place], [place] Doctor in [place], [place] Address: [address] [place], [place] [address] Phone: [phone]

## 2020-02-23 NOTE — ED Triage Notes (Signed)
Pt presents with a right foot injury. She states she was cleaning a bathroom today and tripped over a string that tied around her foot. She states she can only move her toes a little bit. She states she feels a throbbing pain.

## 2020-02-24 ENCOUNTER — Encounter: Payer: Self-pay | Admitting: Orthopaedic Surgery

## 2020-02-24 ENCOUNTER — Telehealth: Payer: Self-pay | Admitting: Internal Medicine

## 2020-02-24 ENCOUNTER — Ambulatory Visit (INDEPENDENT_AMBULATORY_CARE_PROVIDER_SITE_OTHER): Payer: Self-pay | Admitting: Orthopaedic Surgery

## 2020-02-24 ENCOUNTER — Other Ambulatory Visit: Payer: Self-pay

## 2020-02-24 DIAGNOSIS — S92901A Unspecified fracture of right foot, initial encounter for closed fracture: Secondary | ICD-10-CM

## 2020-02-24 DIAGNOSIS — S93601D Unspecified sprain of right foot, subsequent encounter: Secondary | ICD-10-CM

## 2020-02-24 NOTE — Telephone Encounter (Signed)
Please contact pt and make aware that referral has been placed.

## 2020-02-24 NOTE — Progress Notes (Signed)
Office Visit Note   Patient: Alexandra Brewer           Date of Birth: 08/03/1955           MRN: [id number] Visit Date: 02/24/2020              Requested by: Ladell Pier, MD [address] [place],  [place] [address] PCP: Ladell Pier, MD   Assessment & Plan: Visit Diagnoses:  1. Right foot sprain, subsequent encounter     Plan: I went over the x-rays with the patient in detail.  I went over her clinical exam as well using an interpreter.  I showed her ankle and foot model as well.  I feel like she would get more support in a short cam walking boot.  She can weight-bear as tolerated.  I will give her a note to keep her out of work the next 2 weeks.  I would like to see her back in 2 weeks for repeat exam but no x-rays are needed.  All questions and concerns were answered and addressed.  Follow-Up Instructions: Return in about 2 weeks (around 03/09/2020).   Orders:  No orders of the defined types were placed in this encounter.  No orders of the defined types were placed in this encounter.     Procedures: No procedures performed   Clinical Data: No additional findings.   Subjective: Chief Complaint  Patient presents with  . Right Foot - Pain  The patient comes in today as a referral for a right foot injury that occurred yesterday.  She twisted his foot wrong when she was walking awkwardly and landed awkwardly as well.  She was seen in urgent care and x-rays showed a small avulsion fracture on the dorsal aspect of the navicular and possibly the talar neck.  They placed her in a postop shoe and she is following up today.  She does report pain with weightbearing.  She works in housekeeping.  There is an interpreter with her today as well.  She has had no other acute change in her medical status and has not injured this foot or ankle before.  HPI  Review of Systems There is currently listed no headache, chest pain, short of breath, fever, chills, nausea,  vomiting  Objective: Vital Signs: There were no vitals taken for this visit.  Physical Exam She is alert and orient x3 and in no acute distress Ortho Exam Examination of the right foot and ankle does show some global swelling around the foot and ankle itself.  It is clinically well located.  She is neurovascularly intact.  There is pain to palpation along the talus and navicular aspect of the ankle.  Her range of motion of the ankle is almost full but painful. Specialty Comments:  No specialty comments available.  Imaging: DG Foot Complete Right  Result Date: 02/23/2020 CLINICAL DATA:  Right foot injury and pain EXAM: RIGHT FOOT COMPLETE - 3+ VIEW COMPARISON:  05/16/2016 FINDINGS: Dorsal navicular avulsion fracture with regional soft tissue swelling. Slight irregularity of the dorsal talar neck could be from avulsion fracture or obliquity. No subluxation. IMPRESSION: Dorsal avulsion fracture at the navicular and possibly also at the talar neck. Electronically Signed   By: Monte Fantasia M.D.   On: 02/23/2020 18:27   I did independently reviewed the x-rays and agree with the radiologist findings in terms of the small avulsion at the dorsal aspect of the navicular.  PMFS History: Patient Active Problem  List   Diagnosis Date Noted  . Abdominal pain 03/10/2018  . Sciatica of right side 06/11/2017  . GERD (gastroesophageal reflux disease) 11/26/2016  . Right Achilles tendinitis 08/15/2016  . Hypertension 07/05/2016  . Adhesive capsulitis of right shoulder 02/21/2016  . Heel pain, bilateral 01/10/2016  . 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 History of colonic polyps   . Diverticulosis of colon without hemorrhage   . Hypothyroidism 08/10/2015  . Chronic constipation 08/05/2015  . Hyperlipidemia 08/05/2015   Past Medical History:  Diagnosis Date  . Asthma   . Constipation   . Hyperlipemia   . Sciatica   . Thyroid disease     Family History  Problem  Relation Age of Onset  . Diabetes Father   . Hypertension Father   . Asthma Father   . Stroke Maternal Grandmother   . Stroke Maternal Grandfather   . Colon cancer Neg Hx     Past Surgical History:  Procedure Laterality Date  . CATARACT EXTRACTION    . COLONOSCOPY N/A 10/10/2015   Procedure: COLONOSCOPY;  Surgeon: Daneil Dolin, MD;  [facility];  Service: Endoscopy;  Laterality: N/A;  245 - Interpreter scheduled, do NOT move   Social History   Occupational History  . Not on file  Tobacco Use  . Smoking status: Never Smoker  . Smokeless tobacco: Never Used  Vaping Use  . Vaping Use: Never used  Substance and Sexual Activity  . Alcohol use: No    Alcohol/week: 0.0 standard drinks  . Drug use: No  . Sexual activity: Not Currently

## 2020-02-24 NOTE — Telephone Encounter (Signed)
Pt inquiring about a referral for an orthopedic. Pt had an accident recently. Pt inquiring about an appt needed in order to schedule asap.

## 2020-03-01 ENCOUNTER — Other Ambulatory Visit: Payer: Self-pay | Admitting: Internal Medicine

## 2020-03-01 DIAGNOSIS — E039 Hypothyroidism, unspecified: Secondary | ICD-10-CM

## 2020-03-01 MED FILL — MELOXICAM 15 MG TABLET: 15 | 30 days supply | Qty: 30 | Fill #1

## 2020-03-01 MED FILL — LEVOTHYROXINE 112 MCG TAB: 112 | 30 days supply | Qty: 30 | Fill #0

## 2020-03-01 MED FILL — DICLOFENAC SODIUM 1% GEL: 1 | 6 days supply | Qty: 100 | Fill #0

## 2020-03-04 NOTE — Telephone Encounter (Signed)
03/09/2020 Status:    Time: 1:15 PM    Visit Type: OFFICE VISIT [1004]    Provider: Mcarthur Rossetti, MD      Called pt to inform about orthopedic appt scheduled.

## 2020-03-09 ENCOUNTER — Ambulatory Visit (INDEPENDENT_AMBULATORY_CARE_PROVIDER_SITE_OTHER): Payer: PRIVATE HEALTH INSURANCE | Admitting: Orthopaedic Surgery

## 2020-03-09 ENCOUNTER — Encounter: Payer: Self-pay | Admitting: Orthopaedic Surgery

## 2020-03-09 DIAGNOSIS — S93601D Unspecified sprain of right foot, subsequent encounter: Secondary | ICD-10-CM | POA: Diagnosis not present

## 2020-03-09 MED FILL — PANTOPRAZOLE SOD DR 40 MG T: 40 | 30 days supply | Qty: 30 | Fill #1

## 2020-03-09 NOTE — Progress Notes (Signed)
The patient is continue to follow-up after having a significant right foot sprain with small avulsion fracture of the navicular.  She has been weightbearing as tolerated in a cam walking boot and she feels better than what she did 2 weeks ago when she injured it.  She is still having pain.  She also has a postoperative shoe at home that she transitions to on occasion and that also helps.  She has not been able to wear regular shoe just yet due to pain.  She does work as a Secretary/administrator.  I have examined her and seen today with an interpreter present.  Examination of her right foot and ankle shows minimal swelling at this point.  There is only some mild pain in the dorsum of the foot at the midfoot near the talus and navicular area.  There is no instability on exam.  We will transition her now to just her postop shoe with an ASO.  This should continue to improve with time but she should give it 4 to 6 weeks to completely heal.  Follow-up can be as needed.  She will transition out of her postop shoe ASO when she is comfortable.  All questions and concerns were answered and addressed.

## 2020-03-15 ENCOUNTER — Ambulatory Visit: Payer: Self-pay | Admitting: Internal Medicine

## 2020-03-17 ENCOUNTER — Other Ambulatory Visit: Payer: Self-pay

## 2020-03-17 ENCOUNTER — Encounter: Payer: Self-pay | Admitting: Allergy

## 2020-03-17 ENCOUNTER — Other Ambulatory Visit: Payer: Self-pay | Admitting: Allergy

## 2020-03-17 ENCOUNTER — Ambulatory Visit (INDEPENDENT_AMBULATORY_CARE_PROVIDER_SITE_OTHER): Payer: Self-pay | Admitting: Allergy

## 2020-03-17 VITALS — BP 126/82 | HR 79 | Temp 98.0°F | Resp 16 | Ht 60.0 in | Wt 163.8 lb

## 2020-03-17 DIAGNOSIS — J3089 Other allergic rhinitis: Secondary | ICD-10-CM

## 2020-03-17 DIAGNOSIS — J4530 Mild persistent asthma, uncomplicated: Secondary | ICD-10-CM

## 2020-03-17 DIAGNOSIS — J4520 Mild intermittent asthma, uncomplicated: Secondary | ICD-10-CM

## 2020-03-17 DIAGNOSIS — T7800XD Anaphylactic reaction due to unspecified food, subsequent encounter: Secondary | ICD-10-CM

## 2020-03-17 DIAGNOSIS — H1013 Acute atopic conjunctivitis, bilateral: Secondary | ICD-10-CM

## 2020-03-17 MED ORDER — MONTELUKAST SODIUM 10 MG PO TABS
10.0000 mg | ORAL_TABLET | Freq: Every day | ORAL | 5 refills | Status: DC
Start: 2020-03-17 — End: 2020-03-17

## 2020-03-17 MED ORDER — EPINEPHRINE 0.3 MG/0.3ML IJ SOAJ
0.3000 mg | INTRAMUSCULAR | 1 refills | Status: DC | PRN
Start: 2020-03-17 — End: 2020-04-29

## 2020-03-17 MED ORDER — AZELASTINE-FLUTICASONE 137-50 MCG/ACT NA SUSP
NASAL | 5 refills | Status: DC
Start: 2020-03-17 — End: 2020-04-29

## 2020-03-17 MED ORDER — LEVOCETIRIZINE DIHYDROCHLORIDE 5 MG PO TABS
5.0000 mg | ORAL_TABLET | Freq: Every evening | ORAL | 5 refills | Status: DC
Start: 2020-03-17 — End: 2020-04-29

## 2020-03-17 MED ORDER — OLOPATADINE HCL 0.2 % OP SOLN: OPHTHALMIC | 5 refills | Status: DC

## 2020-03-17 NOTE — Progress Notes (Signed)
New Patient Note  RE: Alexandra Brewer MRN: [id number] DOB: [birth year] Date of Office Visit: 03/17/2020  Referring provider: Antony Blackbird, MD Primary care provider: Ladell Pier, MD  Chief Complaint: allergies.   History of present illness: Alexandra Brewer is a 64 y.o. female presenting today for consultation for allergies and asthma.  Interpreter services used via online services.   She states her allergies are very bad. Symptoms include watery nasal drainage, headache, difficulty breathing, throat itch, nasal itch, ear itch and are year-round.  She has tried medications from the pharmacy without much relief.  She states she takes an allergy pill everyday.  She has taken claritin, zyrtec and allegra thus far.  She feels zyrtec and claritin work the best. She is using a nose spray from her doctor and states it is flonase.  flonase does help her nasal symptoms.   She states she has had asthma for years.  She states she get "sick with my asthma once a year when it gets cold".  She reports developing productive cough, difficulty breathing, chest tightness, wheeze when flared. She does have albuterol for relief of symptoms. 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  She states if there are pets in the home she will need to use her albuterol by the end of the day due to symptoms.  She denies requiring systemic steroids for asthma exacerbation.  She does recall going to [facility] June 2020 for asthma flare-up.   She states she is allergic to shrimp.  It causes her to have trouble breathing and she has itching in her face.  She has been avoiding shellfish for the past 10 years.  She avoids all shellfish as states she doesn't like other shellfish.  She also avoids fish as reports causes same symptoms as shrimp does.   Review of systems: Review of Systems  Constitutional: Negative.   HENT:       See HPI  Eyes:       See HPI  Respiratory: Negative.   Cardiovascular: Negative.   Gastrointestinal:  Negative.   Musculoskeletal: Negative.   Skin: Negative.   Neurological: Negative.     All other systems negative unless noted above in HPI  Past medical history: Past Medical History:  Diagnosis Date   Asthma    Constipation    Hyperlipemia    Sciatica    Thyroid disease    Urticaria     Past surgical history: Past Surgical History:  Procedure Laterality Date   CATARACT EXTRACTION     COLONOSCOPY N/A 10/10/2015   Procedure: COLONOSCOPY;  Surgeon: Daneil Dolin, MD;  [facility];  Service: Endoscopy;  Laterality: N/A;  11 - Interpreter scheduled, do NOT move    Family history:  Family History  Problem Relation Age of Onset   Diabetes Father    Hypertension Father    Asthma Father    Stroke Maternal Grandmother    Stroke Maternal Grandfather    Allergic rhinitis Mother    Colon cancer Neg Hx    Angioedema Neg Hx    Atopy Neg Hx    Immunodeficiency Neg Hx    Urticaria Neg Hx     Social history: Lives in a home with carpeting in the home with dogs outside the home. There is electric heating and central cooling.  There is no concern for mildew or mold in the home but there is concern for roaches.  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   Medication List:  Current Outpatient Medications  Medication Sig Dispense Refill   albuterol (VENTOLIN HFA) 108 (90 Base) MCG/ACT inhaler Inhale 2 puffs into the lungs every 6 (six) hours as needed for wheezing or shortness of breath. 8 g 12   amLODipine (NORVASC) 5 MG tablet Take 1 tablet (5 mg total) by mouth daily. 30 tablet 6   cetirizine (ZYRTEC ALLERGY) 10 MG tablet Take 1 tablet (10 mg total) by mouth daily. 30 tablet 0   diclofenac Sodium (VOLTAREN) 1 % GEL APPLY 4 GRAMS TOPICALLY 4 TIMES DAILY. FOR JOINT PAIN 100 g 0   fluticasone (FLONASE) 50 MCG/ACT nasal spray Place 2 sprays into both nostrils daily as needed for allergies or rhinitis. 16 g 6   HYDROcodone-acetaminophen (NORCO/VICODIN) 5-325 MG  tablet Take 1 tablet by mouth every 6 (six) hours as needed for severe pain. 10 tablet 0   levothyroxine (SYNTHROID) 112 MCG tablet TAKE 1 TABLET (112 MCG TOTAL) BY MOUTH DAILY. 30 tablet 2   linaclotide (LINZESS) 145 MCG CAPS capsule Take 1 capsule (145 mcg total) by mouth daily before breakfast. 30 capsule 11   loratadine (CLARITIN) 10 MG tablet Take 1 tablet (10 mg total) by mouth daily. 30 tablet 3   meloxicam (MOBIC) 15 MG tablet Take 1 tablet (15 mg total) by mouth daily. 30 tablet 3   pantoprazole (PROTONIX) 40 MG tablet Take 1 tablet (40 mg total) by mouth daily. 30 tablet 11   Wheat Dextrin (BENEFIBER) POWD Take by mouth 2 (two) times daily. Takes 2 tbsp twice daily     No current facility-administered medications for this visit.    Known medication allergies: Allergies  Allergen Reactions   Naproxen Rash   Fish Allergy     Throat swell up   Penicillins    Shellfish Allergy     Throat swell up     Physical examination: Blood pressure 126/82, pulse 79, temperature 98 F (36.7 C), temperature source Temporal, resp. rate 16, height 5' (1.524 m), weight 163 lb 12.8 oz (74.3 kg), SpO2 99 %.  General: Alert, interactive, in no acute distress. HEENT: PERRLA, TMs pearly gray, turbinates mildly edematous without discharge, post-pharynx non erythematous. Neck: Supple without lymphadenopathy. Lungs: Clear to auscultation without wheezing, rhonchi or rales. {no increased work of breathing. CV: Normal S1, S2 without murmurs. Abdomen: Nondistended, nontender. Skin: Warm and dry, without lesions or rashes. Extremities:  No clubbing, cyanosis or edema. Neuro:   Grossly intact.  Diagnositics/Labs:  Spirometry: FEV1: 1.4l 71%, FVC: 1.96% 80% predicted. There was no significant increase in FEV1 s/p albuterol use  Allergy testing: Environmental allergy skin prick testing is positive to birch and elm.  Intradermal testing is positive to mold mix 2. 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  Allergy testing results were read and interpreted by provider, documented by clinical staff.   Assessment and plan: Allergic rhinitis with conjunctivitis  - environmental allergy testing is positive to tree pollen and mold  - allergen avoidance measures handouts  - recommend use of Xyzal 5mg  daily   - start Singulair 10mg  daily at bedtime.  If you notice any change in mood/behavior/sleep after starting Singulair then stop this medication and let us know.  Symptoms resolve after stopping the medication.    - trial dymista 1 spray each nostril twice a day.  This is a combination nasal spray with Flonase + Astelin (nasal antihistamine).  This helps with both nasal congestion and drainage.   - for itchy/watery eyes use  Olopatadine 0.2% 1 drop each eye daily as needed  - if medication management is not effective considern allergen immunotherapy which helps to retrain the body to become tolerant to the allergens listed above so you have less symptoms and less medication needs.   Asthma, mild persistent  - have access to albuterol inhaler 2 puffs every 4-6 hours as needed for cough/wheeze/shortness of breath/chest tightness.  May use 15-20 minutes prior to activity.   Monitor frequency of use.    - singulair as above  Asthma control goals:   Full participation in all desired activities (may need albuterol before activity)  Albuterol use two time or less a week on average (not counting use with activity)  Cough interfering with sleep two time or less a month  Oral steroids no more than once a year  No hospitalizations  Anaphylaxis due to food  - skin testing to fish and shellfish is positive to crab  - continue avoidance of fish and shellfish  - if your are interested in eating fish or shellfish in future would recommend obtaining serum IgE levels to see if you can perform in-office food challenge  - have access to self-injectable epinephrine (Epipen or  AuviQ) 0.3mg  at all times  - follow emergency action plan in case of allergic reaction  Follow-up in 4-6 months or sooner if needed  I appreciate the opportunity to take part in Alexandra Brewer's care. Please do not hesitate to contact me with questions.  Sincerely,   Prudy Feeler, MD Allergy/Immunology [facility]

## 2020-03-17 NOTE — Patient Instructions (Addendum)
Allergies  - environmental allergy testing is positive to tree pollen and mold  - allergen avoidance measures handouts  - recommend use of Xyzal 5mg  daily   - start Singulair 10mg  daily at bedtime.  If you notice any change in mood/behavior/sleep after starting Singulair then stop this medication and let us know.  Symptoms resolve after stopping the medication.    - trial dymista 1 spray each nostril twice a day.  This is a combination nasal spray with Flonase + Astelin (nasal antihistamine).  This helps with both nasal congestion and drainage.   - for itchy/watery eyes use Olopatadine 0.2% 1 drop each eye daily as needed  - if medication management is not effective considern allergen immunotherapy which helps to retrain the body to become tolerant to the allergens listed above so you have less symptoms and less medication needs.   Asthma  - have access to albuterol inhaler 2 puffs every 4-6 hours as needed for cough/wheeze/shortness of breath/chest tightness.  May use 15-20 minutes prior to activity.   Monitor frequency of use.    - singulair as above  Asthma control goals:   Full participation in all desired activities (may need albuterol before activity)  Albuterol use two time or less a week on average (not counting use with activity)  Cough interfering with sleep two time or less a month  Oral steroids no more than once a year  No hospitalizations  Food allergy  - skin testing to fish and shellfish is positive to crab  - continue avoidance of fish and shellfish  - if your are interested in eating fish or shellfish in future would recommend obtaining serum IgE levels to see if you can perform in-office food challenge  - have access to self-injectable epinephrine (Epipen or AuviQ) 0.3mg  at all times  - follow emergency action plan in case of allergic reaction  Follow-up in 4-6 months or sooner if needed

## 2020-03-18 MED FILL — EPINEPHRINE 0.3 MG AUTO-INJ: 0.3 | 30 days supply | Qty: 2 | Fill #0

## 2020-03-18 MED FILL — DYMISTA NASAL SPRAY: 137-50 | 25 days supply | Qty: 23 | Fill #0

## 2020-03-18 MED FILL — OLOPATADINE HCL 0.2 % SOLN: 0.2 | 25 days supply | Qty: 3 | Fill #0

## 2020-03-18 MED FILL — LEVOCETIRIZINE 5 MG TABLET: 5 | 30 days supply | Qty: 30 | Fill #0

## 2020-03-18 MED FILL — MONTELUKAST SOD 10 MG TAB: 10 | 30 days supply | Qty: 30 | Fill #0

## 2020-03-22 ENCOUNTER — Ambulatory Visit: Payer: Self-pay | Admitting: Internal Medicine

## 2020-04-12 ENCOUNTER — Other Ambulatory Visit: Payer: Self-pay

## 2020-04-12 ENCOUNTER — Ambulatory Visit (HOSPITAL_COMMUNITY)
Admission: EM | Admit: 2020-04-12 | Discharge: 2020-04-12 | Disposition: A | Discharge status: Home / Self Care | Payer: Self-pay | Attending: Emergency Medicine | Admitting: Emergency Medicine

## 2020-04-12 ENCOUNTER — Encounter (HOSPITAL_COMMUNITY): Payer: Self-pay | Admitting: Emergency Medicine

## 2020-04-12 DIAGNOSIS — J029 Acute pharyngitis, unspecified: Secondary | ICD-10-CM | POA: Insufficient documentation

## 2020-04-12 DIAGNOSIS — U071 COVID-19: Secondary | ICD-10-CM | POA: Insufficient documentation

## 2020-04-12 DIAGNOSIS — R519 Headache, unspecified: Secondary | ICD-10-CM | POA: Insufficient documentation

## 2020-04-12 DIAGNOSIS — Z886 Allergy status to analgesic agent status: Secondary | ICD-10-CM | POA: Insufficient documentation

## 2020-04-12 DIAGNOSIS — Z880 Allergy status to penicillin: Secondary | ICD-10-CM | POA: Insufficient documentation

## 2020-04-12 DIAGNOSIS — B349 Viral infection, unspecified: Secondary | ICD-10-CM

## 2020-04-12 DIAGNOSIS — Z79899 Other long term (current) drug therapy: Secondary | ICD-10-CM | POA: Insufficient documentation

## 2020-04-12 MED FILL — LEVOCETIRIZINE 5 MG TABLET: 5 | 30 days supply | Qty: 30 | Fill #1

## 2020-04-12 MED FILL — LEVOTHYROXINE 112 MCG TAB: 112 | 30 days supply | Qty: 30 | Fill #1

## 2020-04-12 MED FILL — MONTELUKAST SOD 10 MG TAB: 10 | 30 days supply | Qty: 30 | Fill #1

## 2020-04-12 NOTE — ED Triage Notes (Signed)
Pt c/o cold sx onset yesterday associated w/sore throat, chills, headache, watery eyes  Denies abd pain, f/v/n/d, SOB/dyspnea  Sts her employer is needing a neg results for her to come back to work   A&O x4... NAD.Marland Kitchen. ambulatory

## 2020-04-12 NOTE — ED Provider Notes (Signed)
[facility]    CSN: [id number] Arrival date & time: 04/12/20  1545      History   Chief Complaint Chief Complaint  Patient presents with  . URI    HPI Alexandra Brewer is a 65 y.o. female.   Patient presents with chills, headache, watery eyes, sore throat since yesterday.  She denies fever, rash, cough, shortness of breath, vomiting, diarrhea, or other symptoms.  She states she needs a COVID test to return to work.  Her medical history includes asthma, hypertension, diverticulosis, hypothyroidism.  The history is provided by the patient and medical records. A language interpreter was used.    Past Medical History:  Diagnosis Date  . Asthma   . Constipation   . Hyperlipemia   . Sciatica   . Thyroid disease   . Urticaria     Patient Active Problem List   Diagnosis Date Noted  . Abdominal pain 03/10/2018  . Sciatica of right side 06/11/2017  . GERD (gastroesophageal reflux disease) 11/26/2016  . Right Achilles tendinitis 08/15/2016  . Hypertension 07/05/2016  . Adhesive capsulitis of right shoulder 02/21/2016  . Heel pain, bilateral 01/10/2016  . 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 History of colonic polyps   . Diverticulosis of colon without hemorrhage   . Hypothyroidism 08/10/2015  . Chronic constipation 08/05/2015  . Hyperlipidemia 08/05/2015    Past Surgical History:  Procedure Laterality Date  . CATARACT EXTRACTION    . COLONOSCOPY N/A 10/10/2015   Procedure: COLONOSCOPY;  Surgeon: Daneil Dolin, MD;  [facility];  Service: Endoscopy;  Laterality: N/A;  245 - Interpreter scheduled, do NOT move    OB History    Gravida  2   Para  1   Term  1   Preterm      AB  1   Living        SAB  1   IAB      Ectopic      Multiple      Live Births  1            Home Medications    Prior to Admission medications   Medication Sig Start Date End Date Taking? Authorizing Provider  albuterol  (VENTOLIN HFA) 108 (90 Base) MCG/ACT inhaler Inhale 2 puffs into the lungs every 6 (six) hours as needed for wheezing or shortness of breath. 03/06/19   Ladell Pier, MD  amLODipine (NORVASC) 5 MG tablet Take 1 tablet (5 mg total) by mouth daily. 07/06/19   Ladell Pier, MD  Azelastine-Fluticasone [phone] MCG/ACT SUSP 1 spray in each nostril twice daily 03/17/20   Kennith Gain, MD  diclofenac Sodium (VOLTAREN) 1 % GEL APPLY 4 GRAMS TOPICALLY 4 TIMES DAILY. FOR JOINT PAIN 12/21/19   Pete Pelt, PA-C  EPINEPHrine 0.3 mg/0.3 mL IJ SOAJ injection Inject 0.3 mg into the muscle as needed for anaphylaxis. As needed for life-threatening allergic reactions 03/17/20   Kennith Gain, MD  HYDROcodone-acetaminophen (NORCO/VICODIN) 5-325 MG tablet Take 1 tablet by mouth every 6 (six) hours as needed for severe pain. 02/23/20   Jaynee Eagles, PA-C  levocetirizine (XYZAL) 5 MG tablet Take 1 tablet (5 mg total) by mouth every evening. 03/17/20   Kennith Gain, MD  levothyroxine (SYNTHROID) 112 MCG tablet TAKE 1 TABLET (112 MCG TOTAL) BY MOUTH DAILY. 03/01/20   Ladell Pier, MD  linaclotide [facility]) 145 MCG CAPS capsule Take 1 capsule (  145 mcg total) by mouth daily before breakfast. 02/02/20   Rourk, Cristopher Estimable, MD  meloxicam (MOBIC) 15 MG tablet Take 1 tablet (15 mg total) by mouth daily. 12/21/19   Pete Pelt, PA-C  montelukast (SINGULAIR) 10 MG tablet Take 1 tablet (10 mg total) by mouth at bedtime. 03/17/20   Kennith Gain, MD  Olopatadine HCl 0.2 % SOLN 1 drop in each eye daily as needed 03/17/20   Kennith Gain, MD  pantoprazole (PROTONIX) 40 MG tablet Take 1 tablet (40 mg total) by mouth daily. 02/02/20   Rourk, Cristopher Estimable, MD  Wheat Dextrin (BENEFIBER) POWD Take by mouth 2 (two) times daily. Takes 2 tbsp twice daily    [provider]    Family History Family History  Problem Relation Age of Onset  . Diabetes Father   .  Hypertension Father   . Asthma Father   . Stroke Maternal Grandmother   . Stroke Maternal Grandfather   . Allergic rhinitis Mother   . Colon cancer Neg Hx   . Angioedema Neg Hx   . Atopy Neg Hx   . Immunodeficiency Neg Hx   . Urticaria Neg Hx     Social History Social History   Tobacco Use  . Smoking status: Never Smoker  . Smokeless tobacco: Never Used  Vaping Use  . Vaping Use: Never used  Substance Use Topics  . Alcohol use: No    Alcohol/week: 0.0 standard drinks  . Drug use: No     Allergies   Naproxen, Fish allergy, Penicillins, and Shellfish allergy   Review of Systems Review of Systems  Constitutional: Positive for chills. Negative for fever.  HENT: Positive for sore throat. Negative for ear pain.   Eyes: Negative for pain and visual disturbance.  Respiratory: Negative for cough and shortness of breath.   Cardiovascular: Negative for chest pain and palpitations.  Gastrointestinal: Negative for abdominal pain and vomiting.  Genitourinary: Negative for dysuria and hematuria.  Musculoskeletal: Negative for arthralgias and back pain.  Skin: Negative for color change and rash.  Neurological: Positive for headaches. Negative for seizures and syncope.  All other systems reviewed and are negative.    Physical Exam Triage Vital Signs ED Triage Vitals  Enc Vitals Group     BP 04/12/20 1657 140/87     Pulse Rate 04/12/20 1657 83     Resp 04/12/20 1657 16     Temp 04/12/20 1657 99.9 F (37.7 C)     Temp Source 04/12/20 1657 Oral     SpO2 04/12/20 1657 98 %     Weight --      Height --      Head Circumference --      Peak Flow --      Pain Score 04/12/20 1655 6     Pain Loc --      Pain Edu? --      Excl. in [place]? --    No data found.  Updated Vital Signs BP 140/87 (BP Location: Right Arm)   Pulse 83   Temp 99.9 F (37.7 C) (Oral)   Resp 16   SpO2 98%   Visual Acuity Right Eye Distance:   Left Eye Distance:   Bilateral Distance:    Right Eye  Near:   Left Eye Near:    Bilateral Near:     Physical Exam Vitals and nursing note reviewed.  Constitutional:      General: She is not in acute distress.  Appearance: She is well-developed and well-nourished.  HENT:     Head: Normocephalic and atraumatic.     Right Ear: Tympanic membrane normal.     Left Ear: Tympanic membrane normal.     Nose: Nose normal.     Mouth/Throat:     Mouth: Mucous membranes are moist.     Pharynx: Oropharynx is clear.  Eyes:     Conjunctiva/sclera: Conjunctivae normal.  Cardiovascular:     Rate and Rhythm: Normal rate and regular rhythm.     Heart sounds: Normal heart sounds.  Pulmonary:     Effort: Pulmonary effort is normal. No respiratory distress.     Breath sounds: Normal breath sounds.  Abdominal:     Palpations: Abdomen is soft.     Tenderness: There is no abdominal tenderness. There is no guarding or rebound.  Musculoskeletal:        General: No edema.     Cervical back: Neck supple.  Skin:    General: Skin is warm and dry.  Neurological:     General: No focal deficit present.     Mental Status: She is alert and oriented to person, place, and time.  Psychiatric:        Mood and Affect: Mood and affect and mood normal.        Behavior: Behavior normal.      UC Treatments / Results  Labs (all labs ordered are listed, but only abnormal results are displayed) Labs Reviewed  SARS CORONAVIRUS 2 (TAT 6-24 HRS)    EKG   Radiology No results found.  Procedures Procedures (including critical care time)  Medications Ordered in UC Medications - No data to display  Initial Impression / Assessment and Plan / UC Course  I have reviewed the triage vital signs and the nursing notes.  Pertinent labs & imaging results that were available during my care of the patient were reviewed by me and considered in my medical decision making (see chart for details).   Viral illness.  COVID pending.  Instructed patient to self quarantine  until the test results are back.  Discussed symptomatic treatment including Tylenol, rest, hydration.  Instructed patient to follow up with PCP if her symptoms are not improving  Patient agrees to plan of care.    Final Clinical Impressions(s) / UC Diagnoses   Final diagnoses:  Viral illness     Discharge Instructions     Your COVID test is pending.  You should self quarantine until the test result is back.    Take Tylenol or ibuprofen as needed for fever or discomfort.  Rest and keep yourself hydrated.    Follow-up with your primary care provider if your symptoms are not improving.        ED Prescriptions    None     PDMP not reviewed this encounter.   Sharion Balloon, NP 04/12/20 1726

## 2020-04-12 NOTE — Discharge Instructions (Addendum)
Your COVID test is pending.  You should self quarantine until the test result is back.    Take Tylenol or ibuprofen as needed for fever or discomfort.  Rest and keep yourself hydrated.    Follow-up with your primary care provider if your symptoms are not improving.     

## 2020-04-13 ENCOUNTER — Telehealth: Payer: Self-pay | Admitting: Internal Medicine

## 2020-04-13 LAB — SARS CORONAVIRUS 2 (TAT 6-24 HRS): SARS Coronavirus 2: POSITIVE — AB

## 2020-04-13 NOTE — Telephone Encounter (Signed)
Pt just tested positive for covid and is inquiring what is the treatment she needs because she has asthma. Please advise and thank you

## 2020-04-13 NOTE — Telephone Encounter (Signed)
Please contact pt and schedule a virtual visit with any available provider. Dr. Leanne Chang should have some available appts tomorrow/Friday

## 2020-04-14 ENCOUNTER — Other Ambulatory Visit: Payer: Self-pay | Admitting: Family

## 2020-04-14 ENCOUNTER — Telehealth: Payer: Self-pay

## 2020-04-14 ENCOUNTER — Ambulatory Visit: Payer: Self-pay | Admitting: Internal Medicine

## 2020-04-14 ENCOUNTER — Other Ambulatory Visit: Payer: Self-pay

## 2020-04-14 ENCOUNTER — Telehealth: Payer: Self-pay | Admitting: Family

## 2020-04-14 DIAGNOSIS — Z609 Problem related to social environment, unspecified: Secondary | ICD-10-CM

## 2020-04-14 DIAGNOSIS — I10 Essential (primary) hypertension: Secondary | ICD-10-CM

## 2020-04-14 DIAGNOSIS — J45909 Unspecified asthma, uncomplicated: Secondary | ICD-10-CM

## 2020-04-14 DIAGNOSIS — U071 COVID-19: Secondary | ICD-10-CM

## 2020-04-14 NOTE — Telephone Encounter (Signed)
Called to discuss with patient about COVID-19 symptoms and the use of one of the available treatments for those with mild to moderate Covid symptoms and at a high risk of hospitalization.  Pt appears to qualify for outpatient treatment due to co-morbid conditions and/or a member of an at-risk group in accordance with the FDA Emergency Use Authorization.    Symptom onset: 04/11/20 Vaccinated: No  Booster? No Immunocompromised? No  Qualifiers: Hyperlipidemia, hypertension, high SVI score  Ms. Alexandra Brewer was seen at the [facility] on 04/12/20 with chills, headache, and sore throat. COVID test was positive.  I spoke with Ms. Alexandra Brewer through [facility] Interpreters as her primary preferred language is Spanish. She confirmed symptom onset and that she is not vaccinated and continues to have sore throat, hoarseness, cough and occasional dizziness.   We discussed the risk, benefits and potential financial costs associated with treatment with Sotrovimab and she wishes to continue. She will need transportation with clinic being notified of request. She is unable to come today as her husband will not be home until after 5pm today.   Terri Piedra, NP 04/14/2020 10:29 AM

## 2020-04-14 NOTE — Progress Notes (Signed)
I connected by phone with Alexandra Brewer on 04/14/2020 at 10:30 AM to discuss the potential use of a new treatment for mild to moderate COVID-19 viral infection in non-hospitalized patients.  This patient is a 65 y.o. female that meets the FDA criteria for Emergency Use Authorization of COVID monoclonal antibody sotrovimab.  Has a (+) direct SARS-CoV-2 viral test result  Has mild or moderate COVID-19   Is NOT hospitalized due to COVID-19  Is within 10 days of symptom onset  Has at least one of the high risk factor(s) for progression to severe COVID-19 and/or hospitalization as defined in EUA.  Specific high risk criteria : Chronic Kidney Disease (CKD), Cardiovascular disease or hypertension and Other high risk medical condition per CDC:  high social risk score   I have spoken and communicated the following to the patient or parent/caregiver regarding COVID monoclonal antibody treatment:  1. FDA has authorized the emergency use for the treatment of mild to moderate COVID-19 in adults and pediatric patients with positive results of direct SARS-CoV-2 viral testing who are 64 years of age and older weighing at least 40 kg, and who are at high risk for progressing to severe COVID-19 and/or hospitalization.  2. The significant known and potential risks and benefits of COVID monoclonal antibody, and the extent to which such potential risks and benefits are unknown.  3. Information on available alternative treatments and the risks and benefits of those alternatives, including clinical trials.  4. Patients treated with COVID monoclonal antibody should continue to self-isolate and use infection control measures (e.g., wear mask, isolate, social distance, avoid sharing personal items, clean and disinfect "high touch" surfaces, and frequent handwashing) according to CDC guidelines.   5. The patient or parent/caregiver has the option to accept or refuse COVID monoclonal antibody treatment.  After  reviewing this information with the patient, the patient has agreed to receive one of the available covid 19 monoclonal antibodies and will be provided an appropriate fact sheet prior to infusion.   Mauricio Po, FNP 04/14/2020 10:30 AM

## 2020-04-14 NOTE — Telephone Encounter (Signed)
Called to discuss with patient about COVID-19 symptoms and the use of one of the available treatments for those with mild to moderate Covid symptoms and at a high risk of hospitalization.  Pt appears to qualify for outpatient treatment due to co-morbid conditions and/or a member of an at-risk group in accordance with the FDA Emergency Use Authorization.    Symptom onset: 04/12/20 Vaccinated: No Booster? No Immunocompromised? No Qualifiers: HTN, Asthma.  Unable to reach pt - Reached pt.   [place] interpreter [employer] # [phone].

## 2020-04-15 ENCOUNTER — Ambulatory Visit (HOSPITAL_COMMUNITY): Payer: Self-pay

## 2020-04-15 ENCOUNTER — Telehealth: Payer: Self-pay | Admitting: Nurse Practitioner

## 2020-04-15 ENCOUNTER — Telehealth: Payer: Self-pay

## 2020-04-15 NOTE — Telephone Encounter (Signed)
Pt. Called into [facility] with Spanish interpreter # [id number]. States she is scheduled for infusion therapy today, but "my daughter is not sure I should have it." Reached Athena N. Pickenpack-Cousar, NP who will call pt. Back to discuss.

## 2020-04-15 NOTE — Telephone Encounter (Signed)
Spoke with patient, husband, and daughter Rosemarie Ax) at length about her appointment (monoclonal antibody infusion) scheduled for today. Discussed medication and answered all questions.   Husband concerned with patient being high risk for allergic reaction in addition to limited information on the Internet regarding it's effectiveness.   Family and patient has chosen to cancel her appointment for today. Discussed quarantine criteria and when to seek medical assistance.   Patient verbalized understanding.   Interpretor services used to complete phone conference.   Elder Love, NP [facility]

## 2020-04-19 ENCOUNTER — Telehealth: Payer: Self-pay | Admitting: Internal Medicine

## 2020-04-19 NOTE — Telephone Encounter (Signed)
Dr. Wynetta Emery will you be able to put labs in

## 2020-04-19 NOTE — Telephone Encounter (Signed)
Please contact pt and make aware that pcp will discuss lab at next visit

## 2020-04-19 NOTE — Telephone Encounter (Signed)
Copied from [place] [id number]. Topic: General - Other >> Apr 19, 2020  9:06 AM Hinda Lenis D wrote: Pt requesting lab appt // please advise   Only active request I see is from 2017. Let me know if patient needs lab appointment and I will contact & schedule.

## 2020-04-21 ENCOUNTER — Encounter (HOSPITAL_COMMUNITY): Payer: Self-pay

## 2020-04-21 ENCOUNTER — Other Ambulatory Visit: Payer: Self-pay

## 2020-04-21 ENCOUNTER — Other Ambulatory Visit (HOSPITAL_COMMUNITY): Payer: Self-pay | Admitting: Family Medicine

## 2020-04-21 ENCOUNTER — Ambulatory Visit (HOSPITAL_COMMUNITY)
Admission: EM | Admit: 2020-04-21 | Discharge: 2020-04-21 | Disposition: A | Discharge status: Home / Self Care | Payer: Self-pay | Attending: Family Medicine | Admitting: Family Medicine

## 2020-04-21 DIAGNOSIS — J4521 Mild intermittent asthma with (acute) exacerbation: Secondary | ICD-10-CM

## 2020-04-21 DIAGNOSIS — U071 COVID-19: Secondary | ICD-10-CM

## 2020-04-21 MED ORDER — ALBUTEROL SULFATE HFA 108 (90 BASE) MCG/ACT IN AERS: 2.0000 | INHALATION_SPRAY | Freq: Four times a day (QID) | RESPIRATORY_TRACT | 0 refills | Status: DC | PRN

## 2020-04-21 MED ORDER — PREDNISONE 20 MG PO TABS: 40.0000 mg | ORAL_TABLET | Freq: Every day | ORAL | 0 refills | Status: DC

## 2020-04-21 MED FILL — ALBUTEROL SULFATE HFA 108 (: 108 (90 BAS | 25 days supply | Qty: 18 | Fill #0

## 2020-04-21 MED FILL — predniSONE 20 MG TABS: 20 | 5 days supply | Qty: 10 | Fill #0

## 2020-04-21 NOTE — ED Triage Notes (Signed)
Pt reports testing covid for covid 10 days ago. She states she wants to have her oxygen level and BP checked. Pt states she is having chest pain. Pt denies SOB, she states she has left arm pain which she is seeing her chiropractor for. Pt states she has Asthma and feels the sxs she is having now are an asthma flare up. Pt denies coughing.

## 2020-04-21 NOTE — ED Provider Notes (Signed)
[place]    CSN: [id number] Arrival date & time: 04/21/20  1250      History   Chief Complaint Chief Complaint  Patient presents with  . Chest Pain    HPI Alexandra Brewer is a 65 y.o. female.   Medical interpreter used today to facilitate visit with patient's consent. She states she was diagnosed 10 days ago with COVID and has had persistent central chest tightness and pleuritic CP only with deep breathing. Some wheezing off and on, typically resolved temporarily with albuterol inhaler. Denies fever, chills, abdominal pain, N/V/D, weakness, dizziness, syncope. Hx of asthma and states this feels similar to past exacerbations.  No known cardiac hx.      Past Medical History:  Diagnosis Date  . Asthma   . Constipation   . Hyperlipemia   . Sciatica   . Thyroid disease   . Urticaria     Patient Active Problem List   Diagnosis Date Noted  . Abdominal pain 03/10/2018  . Sciatica of right side 06/11/2017  . GERD (gastroesophageal reflux disease) 11/26/2016  . Right Achilles tendinitis 08/15/2016  . Hypertension 07/05/2016  . Adhesive capsulitis of right shoulder 02/21/2016  . Heel pain, bilateral 01/10/2016  . 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 History of colonic polyps   . Diverticulosis of colon without hemorrhage   . Hypothyroidism 08/10/2015  . Chronic constipation 08/05/2015  . Hyperlipidemia 08/05/2015    Past Surgical History:  Procedure Laterality Date  . CATARACT EXTRACTION    . COLONOSCOPY N/A 10/10/2015   Procedure: COLONOSCOPY;  Surgeon: Daneil Dolin, MD;  Location: [facility];  Service: Endoscopy;  Laterality: N/A;  245 - Interpreter scheduled, do NOT move    OB History    Gravida  2   Para  1   Term  1   Preterm      AB  1   Living        SAB  1   IAB      Ectopic      Multiple      Live Births  1            Home Medications    Prior to Admission medications   Medication  Sig Start Date End Date Taking? Authorizing Provider  predniSONE (DELTASONE) 20 MG tablet Take 2 tablets (40 mg total) by mouth daily with breakfast. 04/21/20  Yes Volney American, PA-C  albuterol (VENTOLIN HFA) 108 (90 Base) MCG/ACT inhaler Inhale 2 puffs into the lungs every 6 (six) hours as needed for wheezing or shortness of breath. 04/21/20   Volney American, PA-C  amLODipine (NORVASC) 5 MG tablet Take 1 tablet (5 mg total) by mouth daily. 07/06/19   Ladell Pier, MD  Azelastine-Fluticasone [phone] MCG/ACT SUSP 1 spray in each nostril twice daily 03/17/20   Kennith Gain, MD  diclofenac Sodium (VOLTAREN) 1 % GEL APPLY 4 GRAMS TOPICALLY 4 TIMES DAILY. FOR JOINT PAIN 12/21/19   Pete Pelt, PA-C  EPINEPHrine 0.3 mg/0.3 mL IJ SOAJ injection Inject 0.3 mg into the muscle as needed for anaphylaxis. As needed for life-threatening allergic reactions 03/17/20   Kennith Gain, MD  HYDROcodone-acetaminophen (NORCO/VICODIN) 5-325 MG tablet Take 1 tablet by mouth every 6 (six) hours as needed for severe pain. 02/23/20   Jaynee Eagles, PA-C  levocetirizine (XYZAL) 5 MG tablet Take 1 tablet (5 mg total) by mouth every evening. 03/17/20   Padgett,  Rae Halsted, MD  levothyroxine (SYNTHROID) 112 MCG tablet TAKE 1 TABLET (112 MCG TOTAL) BY MOUTH DAILY. 03/01/20   Ladell Pier, MD  linaclotide [facility]) 145 MCG CAPS capsule Take 1 capsule (145 mcg total) by mouth daily before breakfast. 02/02/20   Rourk, Cristopher Estimable, MD  meloxicam (MOBIC) 15 MG tablet Take 1 tablet (15 mg total) by mouth daily. 12/21/19   Pete Pelt, PA-C  montelukast (SINGULAIR) 10 MG tablet Take 1 tablet (10 mg total) by mouth at bedtime. 03/17/20   Kennith Gain, MD  Olopatadine HCl 0.2 % SOLN 1 drop in each eye daily as needed 03/17/20   Kennith Gain, MD  pantoprazole (PROTONIX) 40 MG tablet Take 1 tablet (40 mg total) by mouth daily. 02/02/20   Rourk, Cristopher Estimable, MD   Wheat Dextrin (BENEFIBER) POWD Take by mouth 2 (two) times daily. Takes 2 tbsp twice daily    [provider]    Family History Family History  Problem Relation Age of Onset  . Diabetes Father   . Hypertension Father   . Asthma Father   . Stroke Maternal Grandmother   . Stroke Maternal Grandfather   . Allergic rhinitis Mother   . Colon cancer Neg Hx   . Angioedema Neg Hx   . Atopy Neg Hx   . Immunodeficiency Neg Hx   . Urticaria Neg Hx     Social History Social History   Tobacco Use  . Smoking status: Never Smoker  . Smokeless tobacco: Never Used  Vaping Use  . Vaping Use: Never used  Substance Use Topics  . Alcohol use: No    Alcohol/week: 0.0 standard drinks  . Drug use: No     Allergies   Naproxen, Fish allergy, Penicillins, and Shellfish allergy   Review of Systems Review of Systems PER HPI    Physical Exam Triage Vital Signs ED Triage Vitals  Enc Vitals Group     BP 04/21/20 1328 140/71     Pulse Rate 04/21/20 1328 85     Resp 04/21/20 1328 16     Temp 04/21/20 1328 99 F (37.2 C)     Temp src --      SpO2 04/21/20 1328 99 %     Weight --      Height --      Head Circumference --      Peak Flow --      Pain Score 04/21/20 1326 6     Pain Loc --      Pain Edu? --      Excl. in [place]? --    No data found.  Updated Vital Signs BP 140/71 (BP Location: Right Arm)   Pulse 85   Temp 99 F (37.2 C)   Resp 16   SpO2 99%   Visual Acuity Right Eye Distance:   Left Eye Distance:   Bilateral Distance:    Right Eye Near:   Left Eye Near:    Bilateral Near:     Physical Exam Vitals and nursing note reviewed.  Constitutional:      Appearance: Normal appearance. She is not ill-appearing.  HENT:     Head: Atraumatic.     Right Ear: Tympanic membrane normal.     Left Ear: Tympanic membrane normal.     Nose: Nose normal.     Mouth/Throat:     Mouth: Mucous membranes are moist.     Pharynx: Oropharynx is clear.  Eyes:  Extraocular Movements: Extraocular movements intact.     Conjunctiva/sclera: Conjunctivae normal.  Cardiovascular:     Rate and Rhythm: Normal rate and regular rhythm.     Heart sounds: Normal heart sounds.  Pulmonary:     Effort: Pulmonary effort is normal. No respiratory distress.     Breath sounds: Normal breath sounds. No wheezing or rales.  Abdominal:     General: Bowel sounds are normal. There is no distension.     Palpations: Abdomen is soft.     Tenderness: There is no abdominal tenderness. There is no guarding.  Musculoskeletal:        General: Normal range of motion.     Cervical back: Normal range of motion and neck supple.  Skin:    General: Skin is warm and dry.  Neurological:     Mental Status: She is alert and oriented to person, place, and time.  Psychiatric:        Mood and Affect: Mood normal.        Thought Content: Thought content normal.        Judgment: Judgment normal.     UC Treatments / Results  Labs (all labs ordered are listed, but only abnormal results are displayed) Labs Reviewed - No data to display  EKG   Radiology No results found.  Procedures Procedures (including critical care time)  Medications Ordered in UC Medications - No data to display  Initial Impression / Assessment and Plan / UC Course  I have reviewed the triage vital signs and the nursing notes.  Pertinent labs & imaging results that were available during my care of the patient were reviewed by me and considered in my medical decision making (see chart for details).     Vitals, exam very reassuring today. She is well appearing, O2 sat 99% on room air, speaking comfortably in full sentences, lungs CTAB today. Refill albuterol inhaler and start prednisone burst. Return for acutely worsening sxs.   Final Clinical Impressions(s) / UC Diagnoses   Final diagnoses:  COVID-19  Mild intermittent asthma with acute exacerbation   Discharge Instructions   None    ED  Prescriptions    Medication Sig Dispense Auth. Provider   albuterol (VENTOLIN HFA) 108 (90 Base) MCG/ACT inhaler Inhale 2 puffs into the lungs every 6 (six) hours as needed for wheezing or shortness of breath. 18 g Volney American, [place]   predniSONE (DELTASONE) 20 MG tablet Take 2 tablets (40 mg total) by mouth daily with breakfast. 10 tablet Volney American, [place]     PDMP not reviewed this encounter.   Volney American, [place] 04/21/20 1525

## 2020-04-22 MED FILL — OLOPATADINE HCL 0.2 % SOLN: 0.2 | 25 days supply | Qty: 3 | Fill #1

## 2020-04-22 MED FILL — AMLODIPINE BESYLATE 5 MG TA: 5 | 30 days supply | Qty: 30 | Fill #4

## 2020-04-28 ENCOUNTER — Ambulatory Visit: Payer: Self-pay

## 2020-04-28 NOTE — Patient Instructions (Signed)
Allergic rhinitis with conjunctivitis (tree pollen and mold mix 2) Continue Xyzal 5 mg once a day as needed for runny nose or itching Continue Singulair 10 mg at night Continue Dymista 1 spray each nostril twice a day as needed to help with stuffy nose and runny nose/drainage down throat. Do not sniff deeply after using this nose spray Continue olopatadine 0.2% eyedrops using 1 drop each eye once a day as needed for itchy watery eyes  Mild persistent asthma Start Flovent 110 mcg 2 puffs twice a day with spacer to help prevent cough and wheeze Continue Singulair 10 mg as above to help prevent cough and wheeze May use albuterol 2 puffs every 4 hours as needed for cough, wheeze, tightness in chest, shortness of breath.  Also, may use albuterol 2 puffs 5 to 15 minutes prior to exercise.  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 In case of an allergic reaction, give Benadryl 4 teaspoonfuls every 4 hours, and if life-threatening symptoms occur, inject with EpiPen 0.3 mg.  Please let us know if this treatment plan is not working well for you Schedule a follow-up appointment in 2 months

## 2020-04-29 ENCOUNTER — Ambulatory Visit: Payer: Self-pay | Admitting: Pharmacist

## 2020-04-29 ENCOUNTER — Other Ambulatory Visit: Payer: Self-pay

## 2020-04-29 ENCOUNTER — Other Ambulatory Visit: Payer: Self-pay | Admitting: Family

## 2020-04-29 ENCOUNTER — Ambulatory Visit: Payer: Self-pay

## 2020-04-29 ENCOUNTER — Ambulatory Visit (INDEPENDENT_AMBULATORY_CARE_PROVIDER_SITE_OTHER): Payer: No Typology Code available for payment source | Admitting: Family

## 2020-04-29 ENCOUNTER — Encounter: Payer: Self-pay | Admitting: Family

## 2020-04-29 VITALS — BP 124/80 | HR 96 | Temp 98.3°F | Resp 18 | Ht 60.0 in | Wt 164.0 lb

## 2020-04-29 DIAGNOSIS — T7800XD Anaphylactic reaction due to unspecified food, subsequent encounter: Secondary | ICD-10-CM

## 2020-04-29 DIAGNOSIS — H1013 Acute atopic conjunctivitis, bilateral: Secondary | ICD-10-CM

## 2020-04-29 DIAGNOSIS — J3089 Other allergic rhinitis: Secondary | ICD-10-CM

## 2020-04-29 DIAGNOSIS — J4530 Mild persistent asthma, uncomplicated: Secondary | ICD-10-CM

## 2020-04-29 MED ORDER — EPINEPHRINE 0.3 MG/0.3ML IJ SOAJ: 0.3000 mg | INTRAMUSCULAR | 1 refills | Status: DC | PRN

## 2020-04-29 MED ORDER — AZELASTINE-FLUTICASONE 137-50 MCG/ACT NA SUSP: NASAL | 5 refills | Status: DC

## 2020-04-29 MED ORDER — LEVOCETIRIZINE DIHYDROCHLORIDE 5 MG PO TABS: ORAL_TABLET | ORAL | 5 refills | Status: DC

## 2020-04-29 MED ORDER — FLOVENT HFA 110 MCG/ACT IN AERO: INHALATION_SPRAY | RESPIRATORY_TRACT | 3 refills | Status: DC

## 2020-04-29 MED FILL — DYMISTA NASAL SPRAY: 137-50 | 30 days supply | Qty: 23 | Fill #0

## 2020-04-29 MED FILL — FLOVENT HFA 110 MCG INHALER: 110 | 359 days supply | Qty: 12 | Fill #0

## 2020-04-29 MED FILL — EPINEPHRINE 0.3 MG AUTO-INJ: 0.3 | 30 days supply | Qty: 2 | Fill #0

## 2020-04-29 NOTE — Addendum Note (Signed)
Addended by: Larence Penning on: 04/29/2020 12:32 PM   Modules accepted: Orders

## 2020-04-29 NOTE — Progress Notes (Signed)
[address] [place] [address] Dept: [phone]  FOLLOW UP NOTE  Patient ID: Alexandra Brewer, female    DOB: September 23, 1955  Age: 65 y.o. MRN: [id number] Date of Office Visit: 04/29/2020  Assessment  Chief Complaint: Allergic Rhinitis  (Throat pain with clear mucus ) and Asthma (3 weeks ago was diagnosed with covid and due to that also has asthma. Alexandra Brewer has had shortness of breath, is on antibioties for chest flares /)  HPI Alexandra Brewer is a 65 year old female who presents today for acute visit.  She was last seen on March 17, 2020 by Dr. Nelva Bush for mild persistent asthma, allergic rhinitis with conjunctivitis, and anaphylaxis due to food.  An interpreter is here with her today and helps provide history  Mild persistent asthma is reported as not well controlled with montelukast 10 mg once a day and albuterol as needed.  She reports that since she was diagnosed with COVID-19 on April 12, 2020 her asthma has been worse.  She reports shortness of breath with rest and exertion, wheezing that sounds like a cat, tightness in her chest, and a productive cough with clear sputum.  She has been using her albuterol approximately 3 times a day almost every day since being diagnosed with Covid.  On April 21, 2020 she went to the urgent care due to chest pain and tightness in her chest she was given prednisone and albuterol.  She reports that the prednisone did help a little.  Allergic rhinitis with conjunctivitis is reported as moderately controlled with Xyzal 5 mg once a day, Singulair 10 mg once a day, Dymista 1 spray each nostril twice a day, and olopatadine 0.2% eyedrops as needed.  She reports postnasal drip that is clear now, but is brown when she is sick and denies rhinorrhea, nasal congestion, and sinus tenderness.  She also reports occasional itchy ears.  She reports that when she has been using her Dymista nasal spray causes her throat to be irritated.  After further  questioning she she was sniffing deeply after using her Dymista nasal spray.  Instructed her to sniff lightly after using the Dymista nasal spray so it does not go to her throat and stays in her nostrils.  She continues to avoid fish and shellfish without any accidental ingestion or use of her epinephrine autoinjector device.  She reports that she is scheduled to get her influenza injection later on today and wonders if this is okay to get with her asthma flaring up.  Instructed her to wait until her asthma is under better control.   Drug Allergies:  Allergies  Allergen Reactions  . Naproxen Rash  . Fish Allergy     Throat swell up  . Penicillins   . Shellfish Allergy     Throat swell up    Review of Systems: Review of Systems  Constitutional: Negative for chills and fever.  HENT:       Reports clear postnasal drip and denies rhinorrhea, nasal congestion  Eyes:       Reports occasional itchy watery eyes for which olopatadine helps  Respiratory: Positive for cough, shortness of breath and wheezing.        Reports shortness of breath, wheezing, that sounds like a cat, tightness in her chest, and productive cough with clear sputum  Cardiovascular: Negative for chest pain and palpitations.  Gastrointestinal: Positive for heartburn. Negative for abdominal pain.       Reports history of heartburn for which she takes a  medicine and this helps  Genitourinary: Negative for dysuria.  Skin: Negative for itching and rash.  Neurological: Negative for headaches.  Endo/Heme/Allergies: Positive for environmental allergies.   Physical Exam: BP 124/80   Pulse 96   Temp 98.3 F (36.8 C)   Resp 18   Ht 5' (1.524 m)   Wt 164 lb (74.4 kg)   SpO2 96%   BMI 32.03 kg/m    Physical Exam Constitutional:      Appearance: Normal appearance.  HENT:     Head: Normocephalic and atraumatic.     Comments: Pharynx normal. Eyes normal. Nose normal    Right Ear: Ear canal and external ear normal.      Left Ear: Tympanic membrane, ear canal and external ear normal.     Ears:     Comments: Unable to visualize right tympanic membrane due to cerumen    Nose: Nose normal.     Mouth/Throat:     Mouth: Mucous membranes are moist.     Pharynx: Oropharynx is clear.  Eyes:     Conjunctiva/sclera: Conjunctivae normal.  Cardiovascular:     Rate and Rhythm: Regular rhythm.     Heart sounds: Normal heart sounds.  Pulmonary:     Effort: Pulmonary effort is normal.     Breath sounds: Normal breath sounds.     Comments: Lungs clear to auscultation Musculoskeletal:     Cervical back: Neck supple.  Skin:    General: Skin is warm.  Neurological:     Mental Status: She is alert and oriented to person, place, and time.  Psychiatric:        Mood and Affect: Mood normal.        Behavior: Behavior normal.        Thought Content: Thought content normal.        Judgment: Judgment normal.     Diagnostics: FVC 1.98 L, FEV1 1.49 L predicted FVC 2.67 L, FEV1 2.07 L.  Spirometry indicates mild restriction.  Status post bronchodilator response shows FVC 2.06 L, FEV1 1.58 L.  Spirometry indicates mild restriction with a 6% change in FEV1  Assessment and Plan: 1. Not well controlled mild persistent asthma   2. Non-seasonal allergic rhinitis due to other allergic trigger   3. Allergic conjunctivitis of both eyes   4. Allergy with anaphylaxis due to food, subsequent encounter     No orders of the defined types were placed in this encounter.   Patient Instructions  Allergic rhinitis with conjunctivitis (tree pollen and mold mix 2) Continue Xyzal 5 mg once a day as needed for runny nose or itching Continue Singulair 10 mg at night Continue Dymista 1 spray each nostril twice a day as needed to help with stuffy nose and runny nose/drainage down throat. Do not sniff deeply after using this nose spray Continue olopatadine 0.2% eyedrops using 1 drop each eye once a day as needed for itchy watery eyes  Mild  persistent asthma Start Flovent 110 mcg 2 puffs twice a day with spacer to help prevent cough and wheeze Continue Singulair 10 mg as above to help prevent cough and wheeze May use albuterol 2 puffs every 4 hours as needed for cough, wheeze, tightness in chest, shortness of breath.  Also, may use albuterol 2 puffs 5 to 15 minutes prior to exercise.  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 In case of an allergic reaction, give Benadryl 4 teaspoonfuls every 4 hours, and if life-threatening symptoms occur, inject with  EpiPen 0.3 mg.  Please let us know if this treatment plan is not working well for you Schedule a follow-up appointment in 2 months    Return in about 2 months (around 06/27/2020), or if symptoms worsen or fail to improve.    Thank you for the opportunity to care for this patient.  Please do not hesitate to contact me with questions.  Althea Charon, FNP [facility] [place] [facility] [place]

## 2020-05-03 ENCOUNTER — Ambulatory Visit: Payer: Self-pay | Admitting: Internal Medicine

## 2020-05-05 ENCOUNTER — Telehealth: Payer: Self-pay | Admitting: Physician Assistant

## 2020-05-13 MED FILL — LEVOTHYROXINE 112 MCG TAB: 112 | 30 days supply | Qty: 30 | Fill #2

## 2020-05-13 MED FILL — LEVOCETIRIZINE 5 MG TABLET: 5 | 30 days supply | Qty: 30 | Fill #2

## 2020-05-13 MED FILL — MONTELUKAST SOD 10 MG TAB: 10 | 30 days supply | Qty: 30 | Fill #2

## 2020-05-13 MED FILL — OLOPATADINE HCL 0.2 % SOLN: 0.2 | 25 days supply | Qty: 3 | Fill #1

## 2020-05-13 MED FILL — AMLODIPINE BESYLATE 5 MG TA: 5 | 30 days supply | Qty: 30 | Fill #4

## 2020-05-25 ENCOUNTER — Ambulatory Visit: Payer: Self-pay | Admitting: Orthopaedic Surgery

## 2020-05-25 ENCOUNTER — Ambulatory Visit: Payer: Self-pay | Admitting: Physician Assistant

## 2020-05-31 ENCOUNTER — Ambulatory Visit: Payer: Self-pay | Attending: Internal Medicine | Admitting: Internal Medicine

## 2020-05-31 ENCOUNTER — Encounter: Payer: Self-pay | Admitting: Internal Medicine

## 2020-05-31 ENCOUNTER — Other Ambulatory Visit: Payer: Self-pay

## 2020-05-31 ENCOUNTER — Other Ambulatory Visit: Payer: Self-pay | Admitting: Internal Medicine

## 2020-05-31 VITALS — BP 125/76 | HR 85 | Resp 16 | Wt 164.6 lb

## 2020-05-31 DIAGNOSIS — Z23 Encounter for immunization: Secondary | ICD-10-CM

## 2020-05-31 DIAGNOSIS — G43009 Migraine without aura, not intractable, without status migrainosus: Secondary | ICD-10-CM | POA: Insufficient documentation

## 2020-05-31 DIAGNOSIS — E039 Hypothyroidism, unspecified: Secondary | ICD-10-CM

## 2020-05-31 DIAGNOSIS — I10 Essential (primary) hypertension: Secondary | ICD-10-CM

## 2020-05-31 DIAGNOSIS — J4530 Mild persistent asthma, uncomplicated: Secondary | ICD-10-CM

## 2020-05-31 MED ORDER — TOPIRAMATE 25 MG PO TABS: 25.0000 mg | ORAL_TABLET | Freq: Every day | ORAL | 4 refills | Status: DC

## 2020-05-31 NOTE — Progress Notes (Signed)
Patient ID: Alexandra Brewer, female    DOB: 04-16-1955  MRN: [id number]  CC: Hypertension and Headache   Subjective: Alexandra Brewer is a 65 y.o. female who presents for chronic ds management Her concerns today include:  HTN, thyroid ds, HL, GERD, Frozen shoulder RT, asthma mild persistent  Asthma:  Followed by Allergy and Immunology. Last seen 1 mth ago.  Feels that her asthma is not as controlled as she would like for it to be.  Denies any chronic cough or wheezing. Uses Albuterol once a day in mornings and Flovent once a day.   C/o headaches x 1 mth. All over the head. Occurs about 3 x a wk; lasting 2 hrs Associated with dizziness, tiredness, sometimes photophobia.  No N/V.  No blurred vision Thinks triggered by allergies and weather changes. Also triggered by stress. Does not take anything for HA.  Feels better laying down rather than being up moving around  HTN: Reports compliance with taking amlodipine.  She limits salt in the foods.  No lower extremity edema.  Hypothyroidism: Reports compliance with taking levothyroxine.   Patient Active Problem List   Diagnosis Date Noted  . Abdominal pain 03/10/2018  . Sciatica of right side 06/11/2017  . GERD (gastroesophageal reflux disease) 11/26/2016  . Right Achilles tendinitis 08/15/2016  . Hypertension 07/05/2016  . Adhesive capsulitis of right shoulder 02/21/2016  . Heel pain, bilateral 01/10/2016  . 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 History of colonic polyps   . Diverticulosis of colon without hemorrhage   . Hypothyroidism 08/10/2015  . Chronic constipation 08/05/2015  . Hyperlipidemia 08/05/2015     Current Outpatient Medications on File Prior to Visit  Medication Sig Dispense Refill  . albuterol (VENTOLIN HFA) 108 (90 Base) MCG/ACT inhaler Inhale 2 puffs into the lungs every 6 (six) hours as needed for wheezing or shortness of breath. 18 g 0  . amLODipine (NORVASC) 5 MG tablet  Take 1 tablet (5 mg total) by mouth daily. 30 tablet 6  . Azelastine-Fluticasone 137-50 MCG/ACT SUSP 1 spray in each nostril twice daily 23 g 5  . diclofenac Sodium (VOLTAREN) 1 % GEL APPLY 4 GRAMS TOPICALLY 4 TIMES DAILY. FOR JOINT PAIN 100 g 0  . EPINEPHrine 0.3 mg/0.3 mL IJ SOAJ injection Inject 0.3 mg into the muscle as needed for anaphylaxis. As needed for life-threatening allergic reactions 2 each 1  . fluticasone (FLOVENT HFA) 110 MCG/ACT inhaler Use 2 puffs twice a day with spacer to help prevent cough and wheeze 1 each 3  . HYDROcodone-acetaminophen (NORCO/VICODIN) 5-325 MG tablet Take 1 tablet by mouth every 6 (six) hours as needed for severe pain. (Patient not taking: Reported on 04/29/2020) 10 tablet 0  . levocetirizine (XYZAL) 5 MG tablet Take one tablet once a day as needed for runny nose or itching 30 tablet 5  . levothyroxine (SYNTHROID) 112 MCG tablet TAKE 1 TABLET (112 MCG TOTAL) BY MOUTH DAILY. 30 tablet 2  . linaclotide (LINZESS) 145 MCG CAPS capsule Take 1 capsule (145 mcg total) by mouth daily before breakfast. 30 capsule 11  . meloxicam (MOBIC) 15 MG tablet Take 1 tablet (15 mg total) by mouth daily. 30 tablet 3  . montelukast (SINGULAIR) 10 MG tablet Take 1 tablet (10 mg total) by mouth at bedtime. 30 tablet 5  . Olopatadine HCl 0.2 % SOLN 1 drop in each eye daily as needed 2.5 mL 5  . pantoprazole (PROTONIX) 40 MG tablet Take  1 tablet (40 mg total) by mouth daily. 30 tablet 11  . predniSONE (DELTASONE) 20 MG tablet Take 2 tablets (40 mg total) by mouth daily with breakfast. (Patient not taking: Reported on 04/29/2020) 10 tablet 0  . Wheat Dextrin (BENEFIBER) POWD Take by mouth 2 (two) times daily. Takes 2 tbsp twice daily     No current facility-administered medications on file prior to visit.    Allergies  Allergen Reactions  . Naproxen Rash  . Fish Allergy     Throat swell up  . Penicillins   . Shellfish Allergy     Throat swell up    Social History    Socioeconomic History  . Marital status: Single    Spouse name: Not on file  . Number of children: Not on file  . Years of education: Not on file  . Highest education level: Not on file  Occupational History  . Not on file  Tobacco Use  . Smoking status: Never Smoker  . Smokeless tobacco: Never Used  Vaping Use  . Vaping Use: Never used  Substance and Sexual Activity  . Alcohol use: No    Alcohol/week: 0.0 standard drinks  . Drug use: No  . Sexual activity: Not Currently  Other Topics Concern  . Not on file  Social History Narrative  . Not on file   Social Determinants of Health   Financial Resource Strain: Not on file  Food Insecurity: Not on file  Transportation Needs: Not on file  Physical Activity: Not on file  Stress: Not on file  Social Connections: Not on file  Intimate Partner Violence: Not on file    Family History  Problem Relation Age of Onset  . Diabetes Father   . Hypertension Father   . Asthma Father   . Stroke Maternal Grandmother   . Stroke Maternal Grandfather   . Allergic rhinitis Mother   . Colon cancer Neg Hx   . Angioedema Neg Hx   . Atopy Neg Hx   . Immunodeficiency Neg Hx   . Urticaria Neg Hx     Past Surgical History:  Procedure Laterality Date  . CATARACT EXTRACTION    . COLONOSCOPY N/A 10/10/2015   Procedure: COLONOSCOPY;  Surgeon: Daneil Dolin, MD;  Location: [facility];  Service: Endoscopy;  Laterality: N/A;  245 - Interpreter scheduled, do NOT move    ROS: Review of Systems Negative except as stated above  PHYSICAL EXAM: BP 125/76   Pulse 85   Resp 16   Wt 164 lb 9.6 oz (74.7 kg)   SpO2 96%   BMI 32.15 kg/m   Sitting BP 127/85, 76 Standing BP 123/87 P81 Physical Exam  General appearance - alert, well appearing, and in no distress Mental status - normal mood, behavior, speech, dress, motor activity, and thought processes Neck - supple, no significant adenopathy Chest - clear to auscultation, no wheezes,  rales or rhonchi, symmetric air entry Heart - normal rate, regular rhythm, normal S1, S2, no murmurs, rubs, clicks or gallops Neurological - cranial nerves II through XII intact, motor and sensory grossly normal bilaterally, Romberg sign negative, normal gait and station   CMP Latest Ref Rng & Units 01/22/2019 12/16/2017 12/12/2016  Glucose 65 - 99 mg/dL 84 94 83  BUN 8 - 27 mg/dL 10 15 10   Creatinine 0.57 - 1.00 mg/dL 0.67 0.67 0.77  Sodium 134 - 144 mmol/L 141 141 140  Potassium 3.5 - 5.2 mmol/L 4.7 4.3 4.5  Chloride 96 -  106 mmol/L 101 100 99  CO2 20 - 29 mmol/L 26 25 26   Calcium 8.7 - 10.3 mg/dL 9.8 9.6 9.9  Total Protein 6.0 - 8.5 g/dL 7.4 7.5 7.6  Total Bilirubin 0.0 - 1.2 mg/dL <0.2 <0.2 <0.2  Alkaline Phos 39 - 117 IU/L 124(H) 109 113  AST 0 - 40 IU/L 14 17 22   ALT 0 - 32 IU/L 12 12 16    Lipid Panel     Component Value Date/Time   CHOL 226 (H) 01/22/2019 1546   TRIG 166 (H) 01/22/2019 1546   HDL 52 01/22/2019 1546   CHOLHDL 4.3 01/22/2019 1546   CHOLHDL 3.8 07/14/2015 0908   VLDL 18 07/14/2015 0908   LDLCALC 144 (H) 01/22/2019 1546    CBC    Component Value Date/Time   WBC 7.5 01/22/2019 1546   WBC 7.1 07/14/2015 0908   RBC 4.39 01/22/2019 1546   RBC 4.88 07/14/2015 0908   HGB 12.4 01/22/2019 1546   HCT 37.6 01/22/2019 1546   PLT 404 01/22/2019 1546   MCV 86 01/22/2019 1546   MCH 28.2 01/22/2019 1546   MCH 28.9 07/14/2015 0908   MCHC 33.0 01/22/2019 1546   MCHC 33.2 07/14/2015 0908   RDW 13.1 01/22/2019 1546   LYMPHSABS 1,988 07/14/2015 0908   MONOABS 497 07/14/2015 0908   EOSABS 142 07/14/2015 0908   BASOSABS 71 07/14/2015 0908    ASSESSMENT AND PLAN: 1. Essential hypertension At goal.  Continue amlodipine and low-salt diet - CBC - Comprehensive metabolic panel - Lipid panel  2. Acquired hypothyroidism Continue levothyroxine - TSH  3. Stable asthma, mild persistent Went over the difference between rescue inhaler and maintenance inhaler.   Advised patient that she should be using the Flovent twice a day not once a day.  4. Migraine without aura and without status migrainosus, not intractable Recommend trying Advil or Aleve which can be purchased over-the-counter. We will try her with low-dose of Topamax for prophylaxis Follow-up in 6 weeks to see how she is doing. - topiramate (TOPAMAX) 25 MG tablet; Take 1 tablet (25 mg total) by mouth at bedtime.  Dispense: 30 tablet; Refill: 4 - CT Head Wo Contrast; Future  5. Need for influenza vaccination Patient states she is to come back to the lab to have labs done as the lab is already closed this afternoon.  She states she will get her flu vaccine when she comes to get her labs done.    Patient was given the opportunity to ask questions.  Patient verbalized understanding of the plan and was able to repeat key elements of the plan.  [employer] interpreter used during this encounter. #[id number], Shanon Brow  No orders of the defined types were placed in this encounter.    Requested Prescriptions    No prescriptions requested or ordered in this encounter    No follow-ups on file.  Karle Plumber, MD, FACP

## 2020-06-01 ENCOUNTER — Ambulatory Visit (INDEPENDENT_AMBULATORY_CARE_PROVIDER_SITE_OTHER): Payer: Self-pay | Admitting: Physician Assistant

## 2020-06-01 ENCOUNTER — Ambulatory Visit (INDEPENDENT_AMBULATORY_CARE_PROVIDER_SITE_OTHER): Payer: Self-pay

## 2020-06-01 ENCOUNTER — Encounter: Payer: Self-pay | Admitting: Physician Assistant

## 2020-06-01 VITALS — Ht 60.0 in | Wt 164.0 lb

## 2020-06-01 DIAGNOSIS — M25562 Pain in left knee: Secondary | ICD-10-CM

## 2020-06-01 DIAGNOSIS — G8929 Other chronic pain: Secondary | ICD-10-CM

## 2020-06-01 MED ORDER — METHYLPREDNISOLONE ACETATE 40 MG/ML IJ SUSP
40.0000 mg | INTRAMUSCULAR | Status: AC | PRN
  Administered 2020-06-01: 40 mg via INTRA_ARTICULAR

## 2020-06-01 MED ORDER — LIDOCAINE HCL 1 % IJ SOLN
5.0000 mL | INTRAMUSCULAR | Status: AC | PRN
  Administered 2020-06-01: 5 mL

## 2020-06-01 MED FILL — TOPIRAMATE 25 MG TABS: 25 | 30 days supply | Qty: 30 | Fill #0

## 2020-06-01 NOTE — Progress Notes (Signed)
Office Visit Note   Patient: Alexandra Brewer           Date of Birth: [birth year]           MRN: [id number] Visit Date: 06/01/2020              Requested by: Ladell Pier, MD [address] [place],  [place] [address] PCP: Ladell Pier, MD   Assessment & Plan: Visit Diagnoses:  1. Chronic pain of left knee     Plan: We will have her work on quad strengthening exercises I did review these with her today and had her demonstrate these back.  Questions were encouraged and answered using the interpreter today.  See her back in 2 weeks if she continues to have clinical symptoms of the knee may consider knee arthroscopy.   Follow-Up Instructions: No follow-ups on file.   Orders:  Orders Placed This Encounter  Procedures  . Large Joint Inj  . XR Knee 1-2 Views Left   No orders of the defined types were placed in this encounter.     Procedures: Large Joint Inj: L knee on 06/01/2020 2:07 PM Indications: pain Details: 22 G 1.5 in needle, superolateral approach  Arthrogram: No  Medications: 40 mg methylPREDNISolone acetate 40 MG/ML; 5 mL lidocaine 1 % Aspirate: 3 mL yellow Outcome: tolerated well, no immediate complications Procedure, treatment alternatives, risks and benefits explained, specific risks discussed. Consent was given by the patient. Immediately prior to procedure a time out was called to verify the correct patient, procedure, equipment, support staff and site/side marked as required. Patient was prepped and draped in the usual sterile fashion.       Clinical Data: No additional findings.   Subjective: Chief Complaint  Patient presents with  . Left Knee - Pain    HPI  Alexandra Brewer comes in today for left knee pain.  We have seen her for a while for her knee.  She does have known osteoarthritis knee.  She also has an asymptomatic meniscal tear medial meniscus and does involve the root.  She is had knee pain for the past 2 months.  No known  injury.  Has been taking meloxicam and using diclofenac gel with very little relief.  Pain is worse when walking.  Having a lot of pain medial aspect of the knee.  Now having giving way of the knee periodically.  No other mechanical symptoms.  History of MRI 05/01/2019 left knee which showed a small articular surface tear involving the posterior horn medial meniscus and also a degenerative intrasubstance type signal changes of the medial meniscus.  Lateral meniscus is intact.  Moderate degenerative arthritic changes medial compartment.  Mild degenerative arthritic changes patellofemoral joint mild degenerative changes lateral compartment. Review of Systems   Objective: Vital Signs: Ht 5' (1.524 m)   Wt 164 lb (74.4 kg)   BMI 32.03 kg/m   Physical Exam Constitutional:      Appearance: She is not ill-appearing or diaphoretic.  Pulmonary:     Effort: Pulmonary effort is normal.  Neurological:     Mental Status: She is alert and oriented to person, place, and time.  Psychiatric:        Mood and Affect: Mood normal.     Ortho Exam Bilateral knees good range of motion of both knees.  Tenderness along medial joint line left knee.  No abnormal warmth erythema.  Slight effusion left knee.  No instability valgus varus stressing of either knee.  Specialty Comments:  No specialty comments available.  Imaging: XR Knee 1-2 Views Left  Result Date: 06/01/2020 [facility] lateral views left knee: No acute fractures.  No subluxation dislocation.  Medial joint line narrowing moderate.  No other bony maladies.    PMFS History: Patient Active Problem List   Diagnosis Date Noted  . Migraine without aura and without status migrainosus, not intractable 05/31/2020  . Abdominal pain 03/10/2018  . Sciatica of right side 06/11/2017  . GERD (gastroesophageal reflux disease) 11/26/2016  . Right Achilles tendinitis 08/15/2016  . Hypertension 07/05/2016  . Adhesive capsulitis of right shoulder 02/21/2016  .  Heel pain, bilateral 01/10/2016  . 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 History of colonic polyps   . Diverticulosis of colon without hemorrhage   . Hypothyroidism 08/10/2015  . Chronic constipation 08/05/2015  . Hyperlipidemia 08/05/2015   Past Medical History:  Diagnosis Date  . Asthma   . Constipation   . Hyperlipemia   . Sciatica   . Thyroid disease   . Urticaria     Family History  Problem Relation Age of Onset  . Diabetes Father   . Hypertension Father   . Asthma Father   . Stroke Maternal Grandmother   . Stroke Maternal Grandfather   . Allergic rhinitis Mother   . Colon cancer Neg Hx   . Angioedema Neg Hx   . Atopy Neg Hx   . Immunodeficiency Neg Hx   . Urticaria Neg Hx     Past Surgical History:  Procedure Laterality Date  . CATARACT EXTRACTION    . COLONOSCOPY N/A 10/10/2015   Procedure: COLONOSCOPY;  Surgeon: Daneil Dolin, MD;  Location[facility];  Service: Endoscopy;  Laterality: N/A;  245 - Interpreter scheduled, do NOT move   Social History   Occupational History  . Not on file  Tobacco Use  . Smoking status: Never Smoker  . Smokeless tobacco: Never Used  Vaping Use  . Vaping Use: Never used  Substance and Sexual Activity  . Alcohol use: No    Alcohol/week: 0.0 standard drinks  . Drug use: No  . Sexual activity: Not Currently

## 2020-06-06 ENCOUNTER — Other Ambulatory Visit: Payer: Self-pay | Admitting: Internal Medicine

## 2020-06-06 ENCOUNTER — Ambulatory Visit (HOSPITAL_BASED_OUTPATIENT_CLINIC_OR_DEPARTMENT_OTHER): Payer: Self-pay | Admitting: Pharmacist

## 2020-06-06 ENCOUNTER — Other Ambulatory Visit: Payer: Self-pay

## 2020-06-06 ENCOUNTER — Telehealth: Payer: Self-pay | Admitting: Internal Medicine

## 2020-06-06 ENCOUNTER — Ambulatory Visit: Payer: Self-pay | Attending: Internal Medicine

## 2020-06-06 DIAGNOSIS — M25562 Pain in left knee: Secondary | ICD-10-CM

## 2020-06-06 DIAGNOSIS — E039 Hypothyroidism, unspecified: Secondary | ICD-10-CM

## 2020-06-06 DIAGNOSIS — Z23 Encounter for immunization: Secondary | ICD-10-CM

## 2020-06-06 MED FILL — LEVOCETIRIZINE 5 MG TABLET: 5 | 30 days supply | Qty: 30 | Fill #3

## 2020-06-06 MED FILL — PANTOPRAZOLE SOD DR 40 MG T: 40 | 30 days supply | Qty: 30 | Fill #2

## 2020-06-06 MED FILL — OLOPATADINE HCL 0.2 % SOLN: 0.2 | 18 days supply | Qty: 3 | Fill #2

## 2020-06-06 NOTE — Progress Notes (Signed)
Patient presents for vaccination against influenza per orders of Dr. Wynetta Emery. Consent given. Counseling provided. No contraindications exists. Vaccine administered without incident.   Benard Halsted, PharmD, [facility] [place], [place] [phone]

## 2020-06-06 NOTE — Telephone Encounter (Signed)
Patient came by to ask for a refill on Levothyroxine. Please advise.

## 2020-06-06 NOTE — Telephone Encounter (Signed)
Pt rx has already been sent to pharmacy

## 2020-06-07 ENCOUNTER — Telehealth: Payer: Self-pay

## 2020-06-07 LAB — LIPID PANEL
Chol/HDL Ratio: 4.4 ratio (ref 0.0–4.4)
Cholesterol, Total: 203 mg/dL — ABNORMAL HIGH (ref 100–199)
HDL: 46 mg/dL (ref >39)
LDL Chol Calc (NIH): 97 mg/dL (ref 0–99)
Triglycerides: 356 mg/dL — ABNORMAL HIGH (ref 0–149)
VLDL Cholesterol Cal: 60 mg/dL — ABNORMAL HIGH (ref 5–40)

## 2020-06-07 LAB — COMPREHENSIVE METABOLIC PANEL
ALT: 15 IU/L (ref 0–32)
AST: 16 IU/L (ref 0–40)
Albumin/Globulin Ratio: 1.7 (ref 1.2–2.2)
Albumin: 4.3 g/dL (ref 3.8–4.8)
Alkaline Phosphatase: 114 IU/L (ref 44–121)
BUN/Creatinine Ratio: 15 (ref 12–28)
BUN: 13 mg/dL (ref 8–27)
Bilirubin Total: <0.2 mg/dL (ref 0.0–1.2)
CO2: 27 mmol/L (ref 20–29)
Calcium: 9.2 mg/dL (ref 8.7–10.3)
Chloride: 99 mmol/L (ref 96–106)
Creatinine, Ser: 0.85 mg/dL (ref 0.57–1.00)
Globulin, Total: 2.6 g/dL (ref 1.5–4.5)
Glucose: 94 mg/dL (ref 65–99)
Potassium: 4.9 mmol/L (ref 3.5–5.2)
Sodium: 139 mmol/L (ref 134–144)
Total Protein: 6.9 g/dL (ref 6.0–8.5)
eGFR: 77 mL/min/{1.73_m2} (ref >59)

## 2020-06-07 LAB — CBC
Hematocrit: 40.3 % (ref 34.0–46.6)
Hemoglobin: 13.5 g/dL (ref 11.1–15.9)
MCH: 28.8 pg (ref 26.6–33.0)
MCHC: 33.5 g/dL (ref 31.5–35.7)
MCV: 86 fL (ref 79–97)
Platelets: 402 10*3/uL (ref 150–450)
RBC: 4.68 x10E6/uL (ref 3.77–5.28)
RDW: 13.1 % (ref 11.7–15.4)
WBC: 12 10*3/uL — ABNORMAL HIGH (ref 3.4–10.8)

## 2020-06-07 LAB — TSH: TSH: 3.05 u[IU]/mL (ref 0.450–4.500)

## 2020-06-07 NOTE — Telephone Encounter (Signed)
Contacted pt to go over lab results Alexandra Brewer translated. Pt didn't answer lvm

## 2020-06-09 ENCOUNTER — Other Ambulatory Visit: Payer: Self-pay

## 2020-06-09 ENCOUNTER — Ambulatory Visit (HOSPITAL_COMMUNITY)
Admission: RE | Admit: 2020-06-09 | Discharge: 2020-06-09 | Disposition: A | Discharge status: Home / Self Care | Payer: Self-pay | Source: Ambulatory Visit | Attending: Internal Medicine | Admitting: Internal Medicine

## 2020-06-09 DIAGNOSIS — G43009 Migraine without aura, not intractable, without status migrainosus: Secondary | ICD-10-CM | POA: Insufficient documentation

## 2020-06-14 ENCOUNTER — Telehealth: Payer: Self-pay

## 2020-06-14 NOTE — Telephone Encounter (Signed)
[employer] Alexandra Brewer  Id# [id number]  contacted pt to go over lab results pt didn't answer lvm

## 2020-06-15 ENCOUNTER — Encounter: Payer: Self-pay | Admitting: Physician Assistant

## 2020-06-15 ENCOUNTER — Other Ambulatory Visit: Payer: Self-pay | Admitting: Physician Assistant

## 2020-06-15 ENCOUNTER — Ambulatory Visit (INDEPENDENT_AMBULATORY_CARE_PROVIDER_SITE_OTHER): Payer: Self-pay | Admitting: Physician Assistant

## 2020-06-15 ENCOUNTER — Other Ambulatory Visit: Payer: Self-pay

## 2020-06-15 DIAGNOSIS — M1712 Unilateral primary osteoarthritis, left knee: Secondary | ICD-10-CM

## 2020-06-15 DIAGNOSIS — M25562 Pain in left knee: Secondary | ICD-10-CM

## 2020-06-15 MED ORDER — DICLOFENAC SODIUM 1 % EX GEL: CUTANEOUS | 0 refills | Status: DC

## 2020-06-15 MED FILL — ?LEVOTHYROXINE 112 MCG TAB: 112 | 30 days supply | Qty: 30 | Fill #0

## 2020-06-15 NOTE — Telephone Encounter (Signed)
Patient came into the clinic to get her CT head results. Patient was read message from Dr. Margarita Rana with her CT results with an interpreter. Patient understood.

## 2020-06-15 NOTE — Progress Notes (Signed)
HPI: Patient returns today for follow-up of her left knee status post injection on 06/01/2020.  She returns with a interpreter as she speaks [place].  She states she has some discomfort first thing in the morning before walking but it is better after that.  She feels like the injection definitely helped.  She is having no mechanical symptoms of the knee.  Occasionally knee does feel weak.  She is using the Voltaren gel and feels that that is beneficial.  Review of systems see HPI otherwise negative or noncontributory.  Physical exam: General well-developed well-nourished female in no acute distress.  Ambulates without any assistive device. Left knee she has full extension full flexion.  No instability valgus varus stressing no abnormal warmth erythema.  Impression: Left knee arthritis  Plan: This point time to the fact patient improving in regards to her knee having no mechanical symptoms recommend quad strengthening.  Periodic injections consider supplemental injection in the future.  Discussed knee friendly exercises with her.  Questions were encouraged and answered at length today using an interpreter.  Follow-up as needed.

## 2020-07-02 ENCOUNTER — Other Ambulatory Visit: Payer: Self-pay

## 2020-07-06 ENCOUNTER — Other Ambulatory Visit: Payer: Self-pay

## 2020-07-06 MED FILL — Levocetirizine Dihydrochloride Tab 5 MG: ORAL | 30 days supply | Qty: 30 | Fill #0 | Status: AC

## 2020-07-06 MED FILL — Olopatadine HCl Ophth Soln 0.2% (Base Equivalent): OPHTHALMIC | 3 days supply | Qty: 2.5 | Fill #0 | Status: AC

## 2020-07-06 MED FILL — Montelukast Sodium Tab 10 MG (Base Equiv): ORAL | 30 days supply | Qty: 30 | Fill #0 | Status: AC

## 2020-07-06 MED FILL — Levothyroxine Sodium Tab 112 MCG: ORAL | 30 days supply | Qty: 30 | Fill #0 | Status: AC

## 2020-07-07 ENCOUNTER — Other Ambulatory Visit: Payer: Self-pay

## 2020-07-22 ENCOUNTER — Other Ambulatory Visit: Payer: Self-pay

## 2020-07-22 ENCOUNTER — Ambulatory Visit (INDEPENDENT_AMBULATORY_CARE_PROVIDER_SITE_OTHER): Payer: No Typology Code available for payment source | Admitting: Allergy

## 2020-07-22 ENCOUNTER — Encounter: Payer: Self-pay | Admitting: Allergy

## 2020-07-22 VITALS — BP 148/82 | HR 83 | Temp 98.3°F | Resp 18 | Ht 60.0 in

## 2020-07-22 DIAGNOSIS — J4530 Mild persistent asthma, uncomplicated: Secondary | ICD-10-CM

## 2020-07-22 DIAGNOSIS — T7800XD Anaphylactic reaction due to unspecified food, subsequent encounter: Secondary | ICD-10-CM

## 2020-07-22 DIAGNOSIS — H1045 Other chronic allergic conjunctivitis: Secondary | ICD-10-CM

## 2020-07-22 DIAGNOSIS — J3089 Other allergic rhinitis: Secondary | ICD-10-CM

## 2020-07-22 MED ORDER — IPRATROPIUM BROMIDE 0.06 % NA SOLN
2.0000 | Freq: Two times a day (BID) | NASAL | 12 refills | Status: DC
  Filled 2020-07-22: qty 15, 30d supply, fill #0
  Filled 2020-11-21: qty 15, 30d supply, fill #1
  Filled 2020-12-19: qty 15, 30d supply, fill #2

## 2020-07-22 MED ORDER — CARBINOXAMINE MALEATE 6 MG PO TABS
6.0000 mg | ORAL_TABLET | Freq: Two times a day (BID) | ORAL | 1 refills | Status: DC
  Filled 2020-07-22: qty 120, fill #0

## 2020-07-22 MED ORDER — CARBINOXAMINE MALEATE 6 MG PO TABS: 6.0000 mg | ORAL_TABLET | Freq: Two times a day (BID) | ORAL | 1 refills | Status: DC

## 2020-07-22 MED ORDER — AZELASTINE-FLUTICASONE 137-50 MCG/ACT NA SUSP
NASAL | 5 refills | Status: DC
  Filled 2020-07-22: qty 23, 30d supply, fill #0

## 2020-07-22 MED ORDER — ALBUTEROL SULFATE HFA 108 (90 BASE) MCG/ACT IN AERS
2.0000 | INHALATION_SPRAY | Freq: Four times a day (QID) | RESPIRATORY_TRACT | 0 refills | Status: DC | PRN
  Filled 2020-07-22: qty 18, 25d supply, fill #0

## 2020-07-22 MED ORDER — OLOPATADINE HCL 0.2 % OP SOLN
OPHTHALMIC | 5 refills | Status: DC
  Filled 2020-07-22: qty 2.5, 18d supply, fill #0
  Filled 2020-11-21: qty 2.5, 18d supply, fill #1
  Filled 2020-12-19: qty 2.5, 18d supply, fill #2

## 2020-07-22 MED ORDER — LEVOCETIRIZINE DIHYDROCHLORIDE 5 MG PO TABS
ORAL_TABLET | ORAL | 5 refills | Status: DC
  Filled 2020-07-22: qty 30, fill #0
  Filled 2020-08-10: qty 30, 30d supply, fill #0
  Filled 2020-09-13: qty 30, 30d supply, fill #1
  Filled 2020-10-18: qty 30, 30d supply, fill #2

## 2020-07-22 MED ORDER — MONTELUKAST SODIUM 10 MG PO TABS
ORAL_TABLET | Freq: Every day | ORAL | 5 refills | Status: DC
Start: 2020-07-22 — End: 2020-12-19
  Filled 2020-07-22: qty 30, fill #0
  Filled 2020-08-10: qty 30, 30d supply, fill #0
  Filled 2020-09-13: qty 30, 30d supply, fill #1
  Filled 2020-10-18: qty 30, 30d supply, fill #2
  Filled 2020-11-21: qty 30, 30d supply, fill #3
  Filled 2020-12-19: qty 30, 30d supply, fill #4

## 2020-07-22 NOTE — Progress Notes (Signed)
Follow-up Note  RE: Alexandra Brewer MRN: [id number] DOB: [birth year] Date of Office Visit: 07/22/2020   History of present illness: Alexandra Brewer is a 65 y.o. female presenting today for follow-up of allergic rhinitis with conjunctivitis, asthma and food allergy.  Spanish interpreter is present today for interpretation.   Every morning she wakes up and states its hard for her to talk/speak as she feels her voice is hoarse.  The only thing she is using for this now is salt gargling.  She does have dymista and is using in morning and night and states was causing sore throat.  States was advised she was not using the nasal spray correctly at her last visit and was shown proper technique.  She states she then started using the spray with proper technique and feels it did help a bit better but still having a lot of nasal drainage leading to the hoarseness.  She feels xzyal is not working well for her symptom control either.  She does take singulair daily and does feel that this is effective.  She does report itchy/watery eyes and uses olopatadine when needed that does help.    About 2 weeks ago she did use Ventolin as she was having shortness of breath with relief of symptoms.  She does use uses Ventolin less than twice a week on average for relief of symptoms.  She has flovent and states would use it as needed rather interchangeably with the Ventolin.    She continues to avoid fish and shellfish.  She has access to an epinephrine device without need for use.  She also has questions about how to get earwax out of her ear.  Review of systems: Review of Systems  Constitutional: Negative.   HENT:       See HPI  Eyes:       See HPI  Respiratory:       See HPI   Cardiovascular: Negative.   Gastrointestinal: Negative.   Musculoskeletal: Negative.   Skin: Negative.   Neurological: Negative.     All other systems negative unless noted above in HPI  Past  medical/social/surgical/family history have been reviewed and are unchanged unless specifically indicated below.  No changes  Medication List: Current Outpatient Medications  Medication Sig Dispense Refill  . diclofenac Sodium (VOLTAREN) 1 % GEL APPLY 4 GRAMS TOPICALLY 4 TIMES DAILY. FOR JOINT PAIN 100 g 0  . EPINEPHrine 0.3 mg/0.3 mL IJ SOAJ injection INJECT 0.3 MG INTO THE MUSCLE AS NEEDED FOR ANAPHYLAXIS. AS NEEDED FOR LIFE-THREATENING ALLERGIC REACTIONS 2 each 1  . fluticasone (FLOVENT HFA) 110 MCG/ACT inhaler USE 2 PUFFS TWICE A DAY WITH SPACER TO HELP PREVENT COUGH AND WHEEZE 12 g 3  . ipratropium (ATROVENT) 0.06 % nasal spray Place 2 sprays into both nostrils in the morning and at bedtime. 15 mL 12  . levothyroxine (SYNTHROID) 112 MCG tablet TAKE 1 TABLET (112 MCG TOTAL) BY MOUTH DAILY. 30 tablet 2  . linaclotide (LINZESS) 145 MCG CAPS capsule TAKE 1 CAPSULE (145 MCG TOTAL) BY MOUTH DAILY BEFORE BREAKFAST. 30 capsule 11  . meloxicam (MOBIC) 15 MG tablet Take 1 tablet (15 mg total) by mouth daily. 30 tablet 3  . pantoprazole (PROTONIX) 40 MG tablet TAKE 1 TABLET (40 MG TOTAL) BY MOUTH DAILY. 30 tablet 11  . topiramate (TOPAMAX) 25 MG tablet TAKE 1 TABLET (25 MG TOTAL) BY MOUTH AT BEDTIME. 30 tablet 4  . Wheat Dextrin (BENEFIBER) POWD Take by mouth 2 (two)  times daily. Takes 2 tbsp twice daily    . albuterol (VENTOLIN HFA) 108 (90 Base) MCG/ACT inhaler INHALE 2 PUFFS INTO THE LUNGS EVERY 6 (SIX) HOURS AS NEEDED FOR WHEEZING OR SHORTNESS OF BREATH. 18 g 0  . amLODipine (NORVASC) 5 MG tablet TAKE 1 TABLET (5 MG TOTAL) BY MOUTH DAILY. 30 tablet 6  . Azelastine-Fluticasone 137-50 MCG/ACT SUSP INHALE 1 SPRAY IN EACH NOSTRIL TWICE DAILY 23 g 5  . Carbinoxamine Maleate (RYVENT) 6 MG TABS Take 6 mg by mouth in the morning and at bedtime. 120 tablet 1  . levocetirizine (XYZAL) 5 MG tablet TAKE ONE TABLET ONCE A DAY AS NEEDED FOR RUNNY NOSE OR ITCHING 30 tablet 5  . montelukast (SINGULAIR) 10 MG  tablet TAKE 1 TABLET (10 MG TOTAL) BY MOUTH AT BEDTIME. 30 tablet 5  . Olopatadine HCl 0.2 % SOLN USE 1 DROP IN [facility] EYE DAILY AS NEEDED 2.5 mL 5   No current facility-administered medications for this visit.     Known medication allergies: Allergies  Allergen Reactions  . Naproxen Rash  . Fish Allergy     Throat swell up  . Penicillins   . Shellfish Allergy     Throat swell up     Physical examination: Blood pressure (!) 148/82, pulse 83, temperature 98.3 F (36.8 C), temperature source Temporal, resp. rate 18, height 5' (1.524 m), SpO2 99 %.  General: Alert, interactive, in no acute distress. HEENT: PERRLA, TMs pearly gray, turbinates mildly edematous without discharge, post-pharynx non erythematous. Neck: Supple without lymphadenopathy. Lungs: Clear to auscultation without wheezing, rhonchi or rales. {no increased work of breathing. CV: Normal S1, S2 without murmurs. Abdomen: Nondistended, nontender. Skin: Warm and dry, without lesions or rashes. Extremities:  No clubbing, cyanosis or edema. Neuro:   Grossly intact.  Diagnositics/Labs:  Spirometry: FEV1: 1.25L 64%, FVC: 1.59L 65% predicted this is consistent with a restrictive pattern.   Assessment and plan:   Allergic rhinitis with conjunctivitis (tree pollen and mold) -Change Xyzal to Ryvent 40m twice a day.  This is an antihistamine that may work better -Continue Singulair 10 mg at night -Change Dymista to Atrovent 2 sprays each nostril twice a day (may use up to 3 times a day if needed for nasal drainage) -Continue olopatadine 0.2% eyedrops using 1 drop each eye once a day as needed for itchy watery eyes  Mild persistent asthma -Asthma action plan (if having respiratory illness or asthma flare or not meeting the below goals): Flovent 110 mcg 2 puffs twice a day with spacer to help prevent cough and wheeze -Continue Singulair 10 mg as above to help prevent cough and wheeze -May use Ventolin (albuterol) 2 puffs  every 4 hours as needed for cough, wheeze, tightness in chest, shortness of breath.  Also, may use albuterol 2 puffs 5 to 15 minutes prior to exercise.  Asthma control goals:   Full participation in all desired activities (may need albuterol before activity)  Albuterol use two time or less a week on average (not counting use with activity)  Cough interfering with sleep two time or less a month  Oral steroids no more than once a year  No hospitalizations  Anaphylaxis due to food -Continue avoid fish and shellfish. In case of an allergic reaction, give Benadryl 4 teaspoonfuls every 4 hours, and if life-threatening symptoms occur, inject with EpiPen 0.3 mg.  Ear Wax -can use Debrox kit from the pharmacy  Please let [place] if this treatment plan is not  working well for you Schedule a follow-up appointment in 3 months  I appreciate the opportunity to take part in [place] care. Please do not hesitate to contact me with questions.  Sincerely,   Prudy Feeler, MD Allergy/Immunology [facility]

## 2020-07-22 NOTE — Patient Instructions (Addendum)
Allergic rhinitis with conjunctivitis (tree pollen and mold mix 2) -Change Xyzal to Ryvent 74m twice a day.  This is an antihistamine that may work better -Continue Singulair 10 mg at night -Change Dymista to Atrovent 2 sprays each nostril twice a day (may use up to 3 times a day if needed for nasal drainage) -Continue olopatadine 0.2% eyedrops using 1 drop each eye once a day as needed for itchy watery eyes  Mild persistent asthma -Asthma action plan (if having respiratory illness or asthma flare or not meeting the below goals): Flovent 110 mcg 2 puffs twice a day with spacer to help prevent cough and wheeze -Continue Singulair 10 mg as above to help prevent cough and wheeze -May use Ventolin (albuterol) 2 puffs every 4 hours as needed for cough, wheeze, tightness in chest, shortness of breath.  Also, may use albuterol 2 puffs 5 to 15 minutes prior to exercise.  Asthma control goals:   Full participation in all desired activities (may need albuterol before activity)  Albuterol use two time or less a week on average (not counting use with activity)  Cough interfering with sleep two time or less a month  Oral steroids no more than once a year  No hospitalizations  Anaphylaxis due to food -Continue avoid fish and shellfish. In case of an allergic reaction, give Benadryl 4 teaspoonfuls every 4 hours, and if life-threatening symptoms occur, inject with EpiPen 0.3 mg.  Ear Wax -can use Debrox kit from the pharmacy  Please let [place] if this treatment plan is not working well for you Schedule a follow-up appointment in 3 months

## 2020-07-25 ENCOUNTER — Other Ambulatory Visit: Payer: Self-pay

## 2020-07-27 ENCOUNTER — Other Ambulatory Visit: Payer: Self-pay

## 2020-08-10 ENCOUNTER — Other Ambulatory Visit: Payer: Self-pay

## 2020-08-10 MED FILL — Levothyroxine Sodium Tab 112 MCG: ORAL | 30 days supply | Qty: 30 | Fill #1 | Status: AC

## 2020-08-11 ENCOUNTER — Telehealth (INDEPENDENT_AMBULATORY_CARE_PROVIDER_SITE_OTHER): Payer: Self-pay | Admitting: Nurse Practitioner

## 2020-08-11 NOTE — Progress Notes (Signed)
Called pt for virtual visit using [employer] [phone]. Pt stated that she is due for mammogram and wants to be referred, denies any pain, self-pay. Pt needs breast mammogram scholarship advised pt she can pick up at [facility] or [facility]-did not need visit w/provider

## 2020-08-12 ENCOUNTER — Other Ambulatory Visit: Payer: Self-pay

## 2020-08-22 ENCOUNTER — Other Ambulatory Visit: Payer: Self-pay

## 2020-08-22 ENCOUNTER — Ambulatory Visit: Payer: Self-pay | Attending: Internal Medicine

## 2020-09-13 ENCOUNTER — Other Ambulatory Visit: Payer: Self-pay | Admitting: Internal Medicine

## 2020-09-13 ENCOUNTER — Other Ambulatory Visit: Payer: Self-pay

## 2020-09-13 ENCOUNTER — Other Ambulatory Visit: Payer: Self-pay | Admitting: Physician Assistant

## 2020-09-13 ENCOUNTER — Telehealth: Payer: Self-pay | Admitting: Internal Medicine

## 2020-09-13 ENCOUNTER — Ambulatory Visit: Payer: Self-pay | Attending: Internal Medicine

## 2020-09-13 DIAGNOSIS — I10 Essential (primary) hypertension: Secondary | ICD-10-CM

## 2020-09-13 DIAGNOSIS — M5431 Sciatica, right side: Secondary | ICD-10-CM

## 2020-09-13 DIAGNOSIS — E039 Hypothyroidism, unspecified: Secondary | ICD-10-CM

## 2020-09-13 MED ORDER — LEVOTHYROXINE SODIUM 112 MCG PO TABS
ORAL_TABLET | Freq: Every day | ORAL | 0 refills | Status: DC
  Filled 2020-09-13: qty 30, 30d supply, fill #0

## 2020-09-13 MED ORDER — AMLODIPINE BESYLATE 5 MG PO TABS
ORAL_TABLET | Freq: Every day | ORAL | 0 refills | Status: DC
  Filled 2020-09-13: qty 30, fill #0
  Filled 2020-09-13: qty 30, 30d supply, fill #0

## 2020-09-13 MED ORDER — MELOXICAM 15 MG PO TABS
ORAL_TABLET | Freq: Every day | ORAL | 3 refills | Status: AC
  Filled 2020-09-13: qty 30, 30d supply, fill #0
  Filled 2020-12-19: qty 30, 30d supply, fill #1
  Filled 2021-05-09: qty 30, 30d supply, fill #0
  Filled 2021-08-25: qty 30, 30d supply, fill #1

## 2020-09-13 MED ORDER — LEVOTHYROXINE SODIUM 112 MCG PO TABS
ORAL_TABLET | Freq: Every day | ORAL | 0 refills | Status: DC
  Filled 2020-09-13: qty 30, fill #0

## 2020-09-13 NOTE — Telephone Encounter (Signed)
Rx sent 

## 2020-09-13 NOTE — Telephone Encounter (Signed)
Patient came by to request refills on her medications Amlodipine and Levothyroxine. Please advise.

## 2020-10-11 ENCOUNTER — Ambulatory Visit: Payer: Self-pay | Admitting: Internal Medicine

## 2020-10-17 ENCOUNTER — Ambulatory Visit (INDEPENDENT_AMBULATORY_CARE_PROVIDER_SITE_OTHER): Payer: No Typology Code available for payment source | Admitting: Podiatry

## 2020-10-17 ENCOUNTER — Other Ambulatory Visit: Payer: Self-pay

## 2020-10-17 ENCOUNTER — Ambulatory Visit (INDEPENDENT_AMBULATORY_CARE_PROVIDER_SITE_OTHER): Payer: No Typology Code available for payment source

## 2020-10-17 DIAGNOSIS — M25572 Pain in left ankle and joints of left foot: Secondary | ICD-10-CM

## 2020-10-17 DIAGNOSIS — M722 Plantar fascial fibromatosis: Secondary | ICD-10-CM

## 2020-10-17 NOTE — Patient Instructions (Signed)
Fascitis plantar, rehabilitacin Plantar Fasciitis Rehab Pregunte al mdico qu ejercicios son seguros para usted. Haga los ejercicios exactamente como se lo haya indicado el mdico y gradelos como se lo hayan indicado. Es normal sentir un estiramiento leve, tironeo, opresin o Tree surgeon al [employer] [place]. Detngase de inmediato si siente un dolor repentino o Printmaker. No comience a hacer estos ejercicios hasta que se lo indique el mdico. Ejercicios de elongacin y amplitud de movimiento Estos ejercicios calientan los msculos y las articulaciones, y mejoran lamovilidad y la flexibilidad del pie. Adems, ayudan a Best boy. Estiramiento de la fascia plantar  Sintese con la pierna izquierda/derecha cruzada sobre la rodilla [place]. Sostenga el taln con una mano, con el pulgar cerca del arco. Con la otra [place], sostenga los dedos de los pies y empjelos con [place]. Debe sentir un estiramiento en la base (la parte de abajo) de los dedos o en la parte de abajo del pie (fascia plantar), o en ambos. Mantenga esta posicin durante _________ segundos. Afloje lentamente los dedos y vuelva a la posicin inicial. Repita __________ veces. Realice este ejercicio __________ veces al da. Estiramiento de los gemelos, de pie Este ejercicio tambin se denomina estiramiento de la pantorrilla (los msculos gemelos). Estira los msculos posteriores de la parte superior de la pantorrilla. Prese con las [employer] pared. Extienda la pierna izquierda/derecha hacia atrs y flexione ligeramente la rodilla de la pierna de adelante. Mantenga los talones apoyados en el suelo, los dedos apuntando hacia delante y la rodilla de atrs extendida, y lleve el peso hacia la pared. No arquee la espalda. Debe sentir un ligero estiramiento en la parte superior de la pantorrilla. Mantenga esta posicin durante __________ segundos. Repita __________ veces. Realice este ejercicio  __________ veces al da. Estiramiento del msculo sleo, de pie Este ejercicio tambin se denomina estiramiento de la pantorrilla (sleo). Estira los msculos posteriores de la parte inferior de la pantorrilla. Prese con las [employer] pared. Extienda la pierna izquierda/derecha hacia atrs y flexione ligeramente la rodilla de la pierna de adelante. Mantenga los talones apoyados en el suelo y los dedos apuntando hacia delante, flexione la rodilla de atrs y lleve el peso ligeramente a la pierna de atrs. Debe sentir un estiramiento suave en la parte profunda de la parte inferior de la pantorrilla. Mantenga esta posicin durante __________ segundos. Repita __________ veces. Realice este ejercicio __________ veces al da. Estiramiento de los [employer] gemelos y sleo, de pie con un escaln Este ejercicio estira los msculos posteriores de la parte inferior de la pierna. Estos msculos se encuentran en la parte superior de la pantorrilla (gastrocnemio) y la parte inferior de la pantorrilla (sleo). Prese delante de un escaln apoyando solo la regin metatarsiana de su pie derecho/izquierdo. La regin metatarsiana del pie es la superficie sobre la que caminamos, justo debajo de los dedos. Mantenga el otro pie apoyado con firmeza en el mismo escaln. Sostngase de la pared o de una baranda para mantener el equilibrio. Levante lentamente el [employer], y permita que el peso del cuerpo presione el taln sobre el borde del frente del escaln. Mantenga la rodilla recta y sin doblar. Debe sentir un estiramiento en la pantorrilla. Mantenga esta posicin durante __________ segundos. Vuelva a poner ambos pies sobre el escaln. Repita este ejercicio con una leve flexin en la rodilla izquierda/derecha. Reptalo __________ veces con la rodilla izquierda/derecha extendida y __________ veces con la rodilla izquierda/derecha flexionada. Realice esteejercicio __________ Vicenta Aly  al da. Ejercicio de  equilibrio Este ejercicio aumenta el equilibrio y el control de la fuerza del arco, paraayudar a reducir la presin sobre la fascia plantar. Pararse sobre una pierna Si este ejercicio es muy fcil, puede intentar hacerlo con los ojos cerrados o parado sobre [place]. Sin calzado, prese cerca de una baranda o [place]. Puede sostenerse de la baranda o del marco de la puerta, segn lo necesite. Prese sobre el pie izquierdo/derecho. Sin despegar el dedo gordo del suelo, levante el arco del pie. Debe sentir un estiramiento en la parte de abajo del pie y el arco. No deje que el pie se vaya hacia adentro. Mantenga esta posicin durante __________ segundos. Repita __________ veces. Realice este ejercicio __________ veces al da. Esta informacin no tiene Marine scientist el consejo del mdico. Asegresede hacerle al mdico cualquier pregunta que tenga. Document Revised: 01/29/2020 Document Reviewed: 01/29/2020 Elsevier Patient Education  2022 [employer].

## 2020-10-17 NOTE — Progress Notes (Signed)
Subjective:   Patient ID: Alexandra Brewer, female   DOB: 65 y.o.   MRN: [id number]   HPI Patient points bottom left heel hurting   ROS      Objective:  Physical Exam  Neurovascular status intact discomfort center lateral aspect plantar heel left     Assessment:  Planter fasciitis left     Plan:  Sterile prep injected the fascia 3 mg Kenalog Valium Xylocaine start Aleve and physical therapy reappoint as needed

## 2020-10-18 ENCOUNTER — Other Ambulatory Visit: Payer: Self-pay | Admitting: Internal Medicine

## 2020-10-18 ENCOUNTER — Other Ambulatory Visit: Payer: Self-pay

## 2020-10-18 DIAGNOSIS — E039 Hypothyroidism, unspecified: Secondary | ICD-10-CM

## 2020-10-19 ENCOUNTER — Other Ambulatory Visit: Payer: Self-pay

## 2020-10-19 NOTE — Telephone Encounter (Signed)
Requested medication (s) are due for refill today: yes  Requested medication (s) are on the active medication list: yes   Last refill:  09/13/2020  Future visit scheduled: yes  Notes to clinic:  Failed protocol: TSH needs to be rechecked within 3 months after an abnormal result. Refill until TSH is due   Requested Prescriptions  Pending Prescriptions Disp Refills   levothyroxine (SYNTHROID) 112 MCG tablet 30 tablet 0    Sig: TAKE 1 TABLET (112 MCG TOTAL) BY MOUTH DAILY.      Endocrinology:  Hypothyroid Agents Failed - 10/18/2020  3:05 PM      Failed - TSH needs to be rechecked within 3 months after an abnormal result. Refill until TSH is due.      Passed - TSH in normal range and within 360 days    TSH  Date Value Ref Range Status  06/06/2020 3.050 0.450 - 4.500 uIU/mL Final          Passed - Valid encounter within last 12 months    Recent Outpatient Visits           4 months ago Need for influenza vaccination   [place], RPH-CPP   4 months ago Essential hypertension   [place], MD   8 months ago Frontal headache   [place], MD   10 months ago Viral respiratory illness   [place], Michelle P, NP   1 year ago Essential hypertension   [place], MD       Future Appointments             In 3 weeks Ladell Pier, MD [facility]   In 1 month Padgett, Rae Halsted, MD [facility]

## 2020-10-21 ENCOUNTER — Other Ambulatory Visit: Payer: Self-pay

## 2020-10-24 ENCOUNTER — Other Ambulatory Visit: Payer: Self-pay

## 2020-10-31 ENCOUNTER — Ambulatory Visit: Payer: No Typology Code available for payment source | Admitting: Podiatry

## 2020-11-02 ENCOUNTER — Ambulatory Visit: Payer: No Typology Code available for payment source | Admitting: Podiatry

## 2020-11-14 ENCOUNTER — Other Ambulatory Visit: Payer: Self-pay

## 2020-11-14 ENCOUNTER — Ambulatory Visit: Payer: Self-pay | Attending: Internal Medicine | Admitting: Internal Medicine

## 2020-11-14 DIAGNOSIS — J4530 Mild persistent asthma, uncomplicated: Secondary | ICD-10-CM

## 2020-11-14 DIAGNOSIS — Z289 Immunization not carried out for unspecified reason: Secondary | ICD-10-CM

## 2020-11-14 DIAGNOSIS — E039 Hypothyroidism, unspecified: Secondary | ICD-10-CM

## 2020-11-14 DIAGNOSIS — I10 Essential (primary) hypertension: Secondary | ICD-10-CM

## 2020-11-14 DIAGNOSIS — G43009 Migraine without aura, not intractable, without status migrainosus: Secondary | ICD-10-CM

## 2020-11-14 DIAGNOSIS — R253 Fasciculation: Secondary | ICD-10-CM

## 2020-11-14 MED ORDER — LEVOTHYROXINE SODIUM 112 MCG PO TABS
ORAL_TABLET | Freq: Every day | ORAL | 1 refills | Status: DC
  Filled 2020-11-14: qty 30, 30d supply, fill #0
  Filled 2020-12-19: qty 30, 30d supply, fill #1
  Filled 2021-01-25: qty 30, 30d supply, fill #2
  Filled 2021-03-06: qty 30, 30d supply, fill #3

## 2020-11-14 NOTE — Progress Notes (Signed)
Patient ID: Alexandra Brewer, female   DOB: 04/14/1955, 65 y.o.   MRN: [id number] Virtual Visit via Telephone Note  I connected with [name] on 11/14/2020 at 9:45 a.m by telephone and verified that I am speaking with the correct person using two identifiers  Location: Patient: home Provider: office  Participants: Myself Patient [place] interpreter: [phone], Joellen [place]   I discussed the limitations, risks, security and privacy concerns of performing an evaluation and management service by telephone and the availability of in person appointments. I also discussed with the patient that there may be a patient responsible charge related to this service. The patient expressed understanding and agreed to proceed.   History of Present Illness: HTN, thyroid ds, HL, GERD, Frozen shoulder RT, asthma mild persistent.  Last seen 05/2020.  Today's visit is for chronic ds management.  Dx with migraines on last visit. Prescribed Topamax which she states helped a lot.  Headaches have decreased in frequency with it. She thinks her allergies play a role in her headaches CT head negative except for diffuse paranasal sinus ds.   She sees Dr. Nelva Bush for her allergies and asthma. Last seen by her 07/2020 Feels she is doing well on Flovent, Singulair.  Uses Albuterol MDI once a day. Xyzal changed to Ryvent BID  She has not done the COVID-19 vaccine series as yet.    HTN: checks BP every day.  Gives range 120-135/60s Reports compliance with Norvasc.  "Sometimes I cross the line with salt."  No CP/SOB/LE edema  Hypothyroid:  compliant with Levothyroxine but out x 2 days.  Last TSH 3.05 in March of this yr. -sometimes she feels cold "but that goes away very quickly."  Notice twitching of skin under LT eye 5 days ago.  Lasted about 5 minutes.  No recurrence. Does not drink a lot of coffee or caffeinated beverages.   Outpatient Encounter Medications as of 11/14/2020  Medication Sig   albuterol (VENTOLIN  HFA) 108 (90 Base) MCG/ACT inhaler INHALE 2 PUFFS INTO THE LUNGS EVERY 6 (SIX) HOURS AS NEEDED FOR WHEEZING OR SHORTNESS OF BREATH.   amLODipine (NORVASC) 5 MG tablet TAKE 1 TABLET (5 MG TOTAL) BY MOUTH DAILY.   Azelastine-Fluticasone 137-50 MCG/ACT SUSP INHALE 1 SPRAY IN EACH NOSTRIL TWICE DAILY   Carbinoxamine Maleate (RYVENT) 6 MG TABS Take 6 mg by mouth in the morning and at bedtime.   diclofenac Sodium (VOLTAREN) 1 % GEL APPLY 4 GRAMS TOPICALLY 4 TIMES DAILY. FOR JOINT PAIN   EPINEPHrine 0.3 mg/0.3 mL IJ SOAJ injection INJECT 0.3 MG INTO THE MUSCLE AS NEEDED FOR ANAPHYLAXIS. AS NEEDED FOR LIFE-THREATENING ALLERGIC REACTIONS   fluticasone (FLOVENT HFA) 110 MCG/ACT inhaler USE 2 PUFFS TWICE A DAY WITH SPACER TO HELP PREVENT COUGH AND WHEEZE   ipratropium (ATROVENT) 0.06 % nasal spray Place 2 sprays into both nostrils in the morning and at bedtime.   levocetirizine (XYZAL) 5 MG tablet TAKE ONE TABLET ONCE A DAY AS NEEDED FOR RUNNY NOSE OR ITCHING   levothyroxine (SYNTHROID) 112 MCG tablet TAKE 1 TABLET (112 MCG TOTAL) BY MOUTH DAILY.   linaclotide (LINZESS) 145 MCG CAPS capsule TAKE 1 CAPSULE (145 MCG TOTAL) BY MOUTH DAILY BEFORE BREAKFAST.   meloxicam (MOBIC) 15 MG tablet Take 1 tablet (15 mg total) by mouth daily.   meloxicam (MOBIC) 15 MG tablet TAKE 1 TABLET (15 MG TOTAL) BY MOUTH DAILY.   montelukast (SINGULAIR) 10 MG tablet TAKE 1 TABLET (10 MG TOTAL) BY MOUTH AT BEDTIME.  Olopatadine HCl 0.2 % SOLN USE 1 DROP IN EACH EYE DAILY AS NEEDED   pantoprazole (PROTONIX) 40 MG tablet TAKE 1 TABLET (40 MG TOTAL) BY MOUTH DAILY.   topiramate (TOPAMAX) 25 MG tablet TAKE 1 TABLET (25 MG TOTAL) BY MOUTH AT BEDTIME.   Wheat Dextrin (BENEFIBER) POWD Take by mouth 2 (two) times daily. Takes 2 tbsp twice daily   [DISCONTINUED] fluticasone (FLONASE) 50 MCG/ACT nasal spray Place 2 sprays into both nostrils daily as needed for allergies or rhinitis.   No facility-administered encounter medications on file  as of 11/14/2020.      Observations/Objective: No direct observation done as this was a telephone encounter. Results for orders placed or performed in visit on 05/31/20  CBC  Result Value Ref Range   WBC 12.0 (H) 3.4 - 10.8 x10E3/uL   RBC 4.68 3.77 - 5.28 x10E6/uL   Hemoglobin 13.5 11.1 - 15.9 g/dL   Hematocrit 40.3 34.0 - 46.6 %   MCV 86 79 - 97 fL   MCH 28.8 26.6 - 33.0 pg   MCHC 33.5 31.5 - 35.7 g/dL   RDW 13.1 11.7 - 15.4 %   Platelets 402 150 - 450 x10E3/uL  Comprehensive metabolic panel  Result Value Ref Range   Glucose 94 65 - 99 mg/dL   BUN 13 8 - 27 mg/dL   Creatinine, Ser 0.85 0.57 - 1.00 mg/dL   eGFR 77 >59 mL/min/1.73   BUN/Creatinine Ratio 15 12 - 28   Sodium 139 134 - 144 mmol/L   Potassium 4.9 3.5 - 5.2 mmol/L   Chloride 99 96 - 106 mmol/L   CO2 27 20 - 29 mmol/L   Calcium 9.2 8.7 - 10.3 mg/dL   Total Protein 6.9 6.0 - 8.5 g/dL   Albumin 4.3 3.8 - 4.8 g/dL   Globulin, Total 2.6 1.5 - 4.5 g/dL   Albumin/Globulin Ratio 1.7 1.2 - 2.2   Bilirubin Total <0.2 0.0 - 1.2 mg/dL   Alkaline Phosphatase 114 44 - 121 IU/L   AST 16 0 - 40 IU/L   ALT 15 0 - 32 IU/L  Lipid panel  Result Value Ref Range   Cholesterol, Total 203 (H) 100 - 199 mg/dL   Triglycerides 356 (H) 0 - 149 mg/dL   HDL 46 >39 mg/dL   VLDL Cholesterol Cal 60 (H) 5 - 40 mg/dL   LDL Chol Calc (NIH) 97 0 - 99 mg/dL   Chol/HDL Ratio 4.4 0.0 - 4.4 ratio  TSH  Result Value Ref Range   TSH 3.050 0.450 - 4.500 uIU/mL     Assessment and Plan: 1. Essential hypertension Reported home blood pressure readings are good.  Continue amlodipine.  2. Acquired hypothyroidism Continue levothyroxine.  Plan to recheck TSH on next visit. - levothyroxine (SYNTHROID) 112 MCG tablet; TAKE 1 TABLET (112 MCG TOTAL) BY MOUTH DAILY.  Dispense: 90 tablet; Refill: 1  3. Migraine without aura and without status migrainosus, not intractable Much improved.  Continue Topamax for prophylaxis.  4. Eyelid twitch Observed on  follow-up if this starts happening more frequently.  5. Stable asthma, mild persistent Continue Flovent inhaler and albuterol as needed  6. COVID-19 vaccination not done Strongly encouraged her to get the COVID-19 vaccine series.   Follow Up Instructions: 4 mths   I discussed the assessment and treatment plan with the patient. The patient was provided an opportunity to ask questions and all were answered. The patient agreed with the plan and demonstrated an understanding of the instructions.  The patient was advised to call back or seek an in-person evaluation if the symptoms worsen or if the condition fails to improve as anticipated.  I  Spent 20 minutes on this telephone encounter  Karle Plumber, MD

## 2020-11-21 ENCOUNTER — Other Ambulatory Visit: Payer: Self-pay | Admitting: Allergy

## 2020-11-21 ENCOUNTER — Other Ambulatory Visit: Payer: Self-pay

## 2020-11-21 MED FILL — Pantoprazole Sodium EC Tab 40 MG (Base Equiv): ORAL | 30 days supply | Qty: 30 | Fill #0 | Status: AC

## 2020-11-22 ENCOUNTER — Other Ambulatory Visit: Payer: Self-pay

## 2020-11-23 ENCOUNTER — Telehealth: Payer: Self-pay | Admitting: Allergy

## 2020-11-23 ENCOUNTER — Other Ambulatory Visit: Payer: Self-pay

## 2020-11-23 ENCOUNTER — Ambulatory Visit: Payer: Self-pay | Admitting: Allergy

## 2020-11-23 NOTE — Telephone Encounter (Signed)
Patient is requesting a refill for her levocetirizine. Patient states she spoke to the pharmacy and was told that she did not have anymore refills and she would need to contact our office for any further refills. Patient is requesting this be sent in as soon as possible.  [address], [place] [address]  Patient is requesting a phone call when medication is sent in. Best contact number for patient is [phone]

## 2020-11-23 NOTE — Telephone Encounter (Signed)
Patient was last seen April of 2022. Per her last visit it says Change Xyzal to Ryvent '6mg'$  twice a day. She had an appointment for 05/26/20, but it was rescheduled to 12/19/20.  I will call patient back tomorrow to see if she changed to ryvent per her last visit and send in a 30 day courtesy refill [facility]  The xyzal was last requested for refill and denied 11/21/20.

## 2020-11-24 ENCOUNTER — Other Ambulatory Visit: Payer: Self-pay

## 2020-12-01 NOTE — Telephone Encounter (Signed)
Called patient to go over allergy medications. The interpreter left a message for the patient to call the office back.

## 2020-12-01 NOTE — Telephone Encounter (Signed)
Did anyone follow up on this?

## 2020-12-07 ENCOUNTER — Telehealth: Payer: Self-pay

## 2020-12-07 NOTE — Telephone Encounter (Signed)
Pt has been scheduled and appointment reminder has been mailed.

## 2020-12-07 NOTE — Telephone Encounter (Signed)
-----   Message from Ladell Pier, MD sent at 11/14/2020 10:05 AM EDT ----- Give up agin in 4 mths for chronic disease management

## 2020-12-08 ENCOUNTER — Other Ambulatory Visit: Payer: Self-pay

## 2020-12-08 ENCOUNTER — Other Ambulatory Visit: Payer: Self-pay | Admitting: Obstetrics and Gynecology

## 2020-12-08 DIAGNOSIS — Z1231 Encounter for screening mammogram for malignant neoplasm of breast: Secondary | ICD-10-CM

## 2020-12-17 NOTE — Patient Instructions (Addendum)
Allergic rhinitis with conjunctivitis (tree pollen and mold mix 2) - Stop Zyrtec (cetirizine) -Start Claritin (loratadine) once a day as needed for runny nose/itching. (Unable to get carbinoxamine due to cost per pharmacy. The pharmacy also does not carry Allegra) -Continue Singulair 10 mg at night -Continue Atrovent 2 sprays each nostril twice a day (may use up to 3 times a day if needed for nasal drainage) -Continue olopatadine 0.2% eyedrops using 1 drop each eye once a day as needed for itchy watery eyes  Mild persistent asthma -Start Flovent 110 mcg 2 puffs twice a day with spacer to help prevent cough and wheeze -Continue Singulair 10 mg as above to help prevent cough and wheeze -May use Ventolin (albuterol) 2 puffs every 4 hours as needed for cough, wheeze, tightness in chest, shortness of breath.  Also, may use albuterol 2 puffs 5 to 15 minutes prior to exercise.  Asthma control goals:  Full participation in all desired activities (may need albuterol before activity) Albuterol use two time or less a week on average (not counting use with activity) Cough interfering with sleep two time or less a month Oral steroids no more than once a year No hospitalizations  Anaphylaxis due to food -Continue avoid fish and shellfish. In case of an allergic reaction, give Benadryl 4 teaspoonfuls every 4 hours, and if life-threatening symptoms occur, inject with EpiPen 0.3 mg.  There is no contraindication to getting the COVID-19 vaccine with your drug allergy to penicillin.  Please let us know if this treatment plan is not working well for you Schedule a follow-up appointment in 6 weeks or sooner if needed

## 2020-12-19 ENCOUNTER — Other Ambulatory Visit: Payer: Self-pay

## 2020-12-19 ENCOUNTER — Encounter: Payer: Self-pay | Admitting: Family

## 2020-12-19 ENCOUNTER — Telehealth: Payer: Self-pay

## 2020-12-19 ENCOUNTER — Ambulatory Visit (INDEPENDENT_AMBULATORY_CARE_PROVIDER_SITE_OTHER): Payer: No Typology Code available for payment source | Admitting: Family

## 2020-12-19 VITALS — BP 140/80 | HR 90 | Temp 97.8°F | Resp 16 | Ht 60.0 in | Wt 166.4 lb

## 2020-12-19 DIAGNOSIS — J4530 Mild persistent asthma, uncomplicated: Secondary | ICD-10-CM

## 2020-12-19 DIAGNOSIS — J3089 Other allergic rhinitis: Secondary | ICD-10-CM

## 2020-12-19 DIAGNOSIS — H1013 Acute atopic conjunctivitis, bilateral: Secondary | ICD-10-CM

## 2020-12-19 DIAGNOSIS — T7800XD Anaphylactic reaction due to unspecified food, subsequent encounter: Secondary | ICD-10-CM

## 2020-12-19 MED ORDER — MONTELUKAST SODIUM 10 MG PO TABS
ORAL_TABLET | Freq: Every day | ORAL | 5 refills | Status: DC
  Filled 2020-12-19 – 2020-12-21 (×3): qty 30, 30d supply, fill #0
  Filled 2021-03-06: qty 30, 30d supply, fill #1

## 2020-12-19 MED ORDER — CARBINOXAMINE MALEATE 6 MG PO TABS
6.0000 mg | ORAL_TABLET | Freq: Two times a day (BID) | ORAL | 3 refills | Status: DC
  Filled 2020-12-19: qty 60, fill #0

## 2020-12-19 MED ORDER — IPRATROPIUM BROMIDE 0.06 % NA SOLN
2.0000 | Freq: Two times a day (BID) | NASAL | 5 refills | Status: DC
  Filled 2020-12-19 – 2021-01-25 (×2): qty 15, 38d supply, fill #0
  Filled 2021-03-06: qty 15, 30d supply, fill #1

## 2020-12-19 MED ORDER — OLOPATADINE HCL 0.2 % OP SOLN
OPHTHALMIC | 5 refills | Status: DC
  Filled 2020-12-19: qty 2.5, 20d supply, fill #0
  Filled 2020-12-21: qty 2.5, 18d supply, fill #0
  Filled 2021-01-25: qty 2.5, 18d supply, fill #1
  Filled 2021-04-11: qty 2.5, 18d supply, fill #0
  Filled 2021-06-23: qty 2.5, 18d supply, fill #1
  Filled 2021-07-24: qty 2.5, 18d supply, fill #2
  Filled 2021-08-25: qty 2.5, 18d supply, fill #3

## 2020-12-19 MED ORDER — FEXOFENADINE HCL 180 MG PO TABS
ORAL_TABLET | ORAL | 3 refills | Status: DC
  Filled 2020-12-19: qty 30, fill #0

## 2020-12-19 MED ORDER — LORATADINE 10 MG PO TABS
10.0000 mg | ORAL_TABLET | Freq: Every day | ORAL | 3 refills | Status: DC
  Filled 2020-12-19: qty 30, 30d supply, fill #0

## 2020-12-19 NOTE — Progress Notes (Addendum)
[address] [place] [address] Dept: [phone]  FOLLOW UP NOTE  Patient ID: Alexandra Brewer, female    DOB: [birth year]  Age: 65 y.o. MRN: [id number] Date of Office Visit: 12/19/2020  Assessment  Chief Complaint: Follow-up  HPI Alexandra Brewer is a 65 year old female who presents today for follow-up of allergic rhinitis with conjunctivitis, mild persistent asthma, anaphylaxis due to food, and earwax.  She was last seen on July 22, 2020 by Dr. Nelva Bush.  A translator is here with her today while in the office.  Allergic rhinitis with conjunctivitis is reported as feeling a lot better since her last office visit.  She did not get RyVent that was prescribed at the last office visit but was using Zyrtec in its place.  She did not feel like it helped with her symptoms.  She also continues to take Singulair 10 mg at night and Atrovent nasal spray 2 sprays each nostril twice a day.  She reports itchy watery eyes for which olopatadine 0.2% eyedrops help some and denies rhinorrhea, nasal congestion, and postnasal drip.  She has not had any sinus infections since we last saw her.  She did not ever try Debrox that was recommended at her last office visit for the cerumen in her right ear.  Mild persistent asthma is reported as not well controlled with Singulair 10 mg once a day, albuterol as needed, and Flovent 110 mcg 2 puffs twice a day with asthma flares/upper respiratory tract infections.  She reports for the past week she has had shortness of breath.  She used her albuterol and it did help.  She has been using her albuterol at least 2 times a week.  She denies coughing, wheezing, tightness in her chest, and nocturnal awakenings.  Since her last office visit she has not required any systemic steroids or made any trips to the emergency room or urgent care due to breathing problems.  She has only used Flovent 110 mcg 2 puffs twice a day for asthma flare/upper respiratory tract  infections once since her last office visit.    She continues to avoid fish and shellfish without any accidental ingestion or use of her EpiPen.  She reports that her EpiPen is up-to-date.   Drug Allergies:  Allergies  Allergen Reactions   Naproxen Rash   Fish Allergy     Throat swell up   Penicillins    Shellfish Allergy     Throat swell up    Review of Systems: Review of Systems  Constitutional:  Positive for chills. Negative for fever.       Reports chills that her primary care physician said is due to her thyroid  HENT:         Denies rhinorrhea, nasal congestion, and postnasal drip  Eyes:        Reports itchy watery eyes at times  Respiratory:  Positive for shortness of breath. Negative for cough and wheezing.        Reports shortness of breath the last week.  Denies coughing, wheezing, tightness in her chest, and nocturnal awakenings.  Cardiovascular:  Negative for chest pain and palpitations.  Gastrointestinal:        Denies heartburn and reflux symptoms  Genitourinary:  Negative for frequency.  Skin:  Positive for itching. Negative for rash.       Reports itching sometimes  Neurological:  Negative for headaches.  Endo/Heme/Allergies:  Positive for environmental allergies.    Physical Exam: BP 140/80  Pulse 90   Temp 97.8 F (36.6 C) (Temporal)   Resp 16   Ht 5' (1.524 m)   Wt 166 lb 6 oz (75.5 kg)   SpO2 98%   BMI 32.49 kg/m    Physical Exam Constitutional:      Appearance: Normal appearance.  HENT:     Head: Normocephalic and atraumatic.     Comments: Pharynx normal, eyes normal, ears: Unable to see right tympanic membrane due to cerumen.  Left ear normal, nose normal    Right Ear: Ear canal and external ear normal.     Left Ear: Tympanic membrane, ear canal and external ear normal.     Nose: Nose normal.     Mouth/Throat:     Mouth: Mucous membranes are moist.     Pharynx: Oropharynx is clear.  Eyes:     Conjunctiva/sclera: Conjunctivae normal.   Cardiovascular:     Rate and Rhythm: Regular rhythm.     Heart sounds: Normal heart sounds.  Pulmonary:     Effort: Pulmonary effort is normal.     Breath sounds: Normal breath sounds.     Comments: Lungs clear to auscultation Musculoskeletal:     Cervical back: Neck supple.  Skin:    General: Skin is warm.  Neurological:     Mental Status: She is alert and oriented to person, place, and time.  Psychiatric:        Mood and Affect: Mood normal.        Behavior: Behavior normal.        Thought Content: Thought content normal.        Judgment: Judgment normal.    Diagnostics: FVC 1.57 L, FEV1 1.21 L.  Predicted FVC 2.67 L, predicted FEV1 2.07 L.  Spirometry indicates moderate restriction.  Post bronchodilator response shows FVC 1.65 L, FEV1 1.25 L.  Spirometry indicates moderate restriction with a 3% change in FEV1.  Assessment and Plan: 1. Not well controlled mild persistent asthma   2. Non-seasonal allergic rhinitis due to other allergic trigger   3. Allergic conjunctivitis of both eyes   4. Allergy with anaphylaxis due to food, subsequent encounter     Meds ordered this encounter  Medications   DISCONTD: Carbinoxamine Maleate (RYVENT) 6 MG TABS    Sig: Take 6 mg by mouth in the morning and at bedtime.    Dispense:  60 tablet    Refill:  3   ipratropium (ATROVENT) 0.06 % nasal spray    Sig: Place 2 sprays into both nostrils in the morning and at bedtime.    Dispense:  15 mL    Refill:  5   Olopatadine HCl 0.2 % SOLN    Sig: Place 1 drop in each eye once a day as needed for itchy watery eyes    Dispense:  2.5 mL    Refill:  5   montelukast (SINGULAIR) 10 MG tablet    Sig: TAKE 1 TABLET (10 MG TOTAL) BY MOUTH AT BEDTIME.    Dispense:  30 tablet    Refill:  5   DISCONTD: fexofenadine (ALLEGRA ALLERGY) 180 MG tablet    Sig: Take 1 tablet by mouth daily as needed for runny nose/itching    Dispense:  30 tablet    Refill:  3   loratadine (CLARITIN) 10 MG tablet    Sig:  Take 1 tablet (10 mg total) by mouth daily.    Dispense:  30 tablet    Refill:  3  Patient Instructions  Allergic rhinitis with conjunctivitis (tree pollen and mold mix 2) - Stop Zyrtec (cetirizine) -Start Claritin (loratadine) once a day as needed for runny nose/itching. (Unable to get carbinoxamine due to cost per pharmacy. The pharmacy also does not carry Allegra) -Continue Singulair 10 mg at night -Continue Atrovent 2 sprays each nostril twice a day (may use up to 3 times a day if needed for nasal drainage) -Continue olopatadine 0.2% eyedrops using 1 drop each eye once a day as needed for itchy watery eyes  Mild persistent asthma -Start Flovent 110 mcg 2 puffs twice a day with spacer to help prevent cough and wheeze -Continue Singulair 10 mg as above to help prevent cough and wheeze -May use Ventolin (albuterol) 2 puffs every 4 hours as needed for cough, wheeze, tightness in chest, shortness of breath.  Also, may use albuterol 2 puffs 5 to 15 minutes prior to exercise.  Asthma control goals:  Full participation in all desired activities (may need albuterol before activity) Albuterol use two time or less a week on average (not counting use with activity) Cough interfering with sleep two time or less a month Oral steroids no more than once a year No hospitalizations  Anaphylaxis due to food -Continue avoid fish and shellfish. In case of an allergic reaction, give Benadryl 4 teaspoonfuls every 4 hours, and if life-threatening symptoms occur, inject with EpiPen 0.3 mg.  There is no contraindication to getting the COVID-19 vaccine with your drug allergy to penicillin.  Please let us know if this treatment plan is not working well for you Schedule a follow-up appointment in 6 weeks or sooner if needed  Return in about 6 weeks (around 01/30/2021), or if symptoms worsen or fail to improve.    Thank you for the opportunity to care for this patient.  Please do not hesitate to  contact me with questions.  Althea Charon, FNP [facility] [place] [facility] [place]

## 2020-12-19 NOTE — Telephone Encounter (Signed)
Thank you :)

## 2020-12-19 NOTE — Telephone Encounter (Signed)
Please call patient and let her know that we want her to start claritin 1 tablet once a day due to cost and insurance not covering her other allergy medications. I am mailing her updated AVS to her home. This call was approved by [facility].

## 2020-12-19 NOTE — Addendum Note (Signed)
Addended by: Althea Charon on: 12/19/2020 01:35 PM   Modules accepted: Orders

## 2020-12-19 NOTE — Telephone Encounter (Signed)
Left message advising patient of change. Advised patient to call our office with any questions or concerns.

## 2020-12-20 ENCOUNTER — Other Ambulatory Visit: Payer: Self-pay

## 2020-12-20 MED FILL — Fluticasone Propionate HFA Inhal Aer 110 MCG/ACT: RESPIRATORY_TRACT | 30 days supply | Qty: 12 | Fill #0 | Status: AC

## 2020-12-21 ENCOUNTER — Other Ambulatory Visit: Payer: Self-pay

## 2020-12-29 ENCOUNTER — Other Ambulatory Visit: Payer: Self-pay

## 2020-12-29 ENCOUNTER — Encounter (HOSPITAL_COMMUNITY): Payer: Self-pay

## 2020-12-29 ENCOUNTER — Ambulatory Visit (HOSPITAL_COMMUNITY)
Admission: EM | Admit: 2020-12-29 | Discharge: 2020-12-29 | Disposition: A | Discharge status: Home / Self Care | Payer: Self-pay | Attending: Family Medicine | Admitting: Family Medicine

## 2020-12-29 DIAGNOSIS — M5431 Sciatica, right side: Secondary | ICD-10-CM

## 2020-12-29 DIAGNOSIS — R109 Unspecified abdominal pain: Secondary | ICD-10-CM

## 2020-12-29 LAB — POCT URINALYSIS DIPSTICK, ED / UC
Bilirubin Urine: NEGATIVE
Glucose, UA: NEGATIVE mg/dL
Hgb urine dipstick: NEGATIVE
Ketones, ur: NEGATIVE mg/dL
Leukocytes,Ua: NEGATIVE
Nitrite: NEGATIVE
Protein, ur: NEGATIVE mg/dL
Specific Gravity, Urine: <=1.005 (ref 1.005–1.030)
Urobilinogen, UA: 0.2 mg/dL (ref 0.0–1.0)
pH: 6.5 (ref 5.0–8.0)

## 2020-12-29 MED ORDER — MELOXICAM 15 MG PO TABS
15.0000 mg | ORAL_TABLET | Freq: Every day | ORAL | 0 refills | Status: DC
  Filled 2020-12-29: qty 30, 30d supply, fill #0

## 2020-12-29 NOTE — ED Triage Notes (Signed)
Pt reports abdominal pain and right flank pain x 2 weeks.

## 2021-01-02 NOTE — ED Provider Notes (Signed)
[facility]    CSN: [id number] Arrival date & time: 12/29/20  1045      History   Chief Complaint Chief Complaint  Patient presents with   Abdominal Pain    HPI Alexandra Brewer is a 65 y.o. female.   Medical interpreter utilized today to facilitate visit.  Patient presenting today with 2-week history of mild abdominal and right flank pain.  She denies associated nausea, vomiting, diarrhea, constipation, decreased appetite, fever, chills, dysuria, hematuria, urinary frequency, vaginal symptoms.  She states the pain occasionally travels down the back of her right leg and is worse with movement.  Unaware of any injury or history of similar issues.  So far not tried anything over-the-counter for symptoms.   Past Medical History:  Diagnosis Date   Asthma    Constipation    Hyperlipemia    Sciatica    Thyroid disease    Urticaria     Patient Active Problem List   Diagnosis Date Noted   Migraine without aura and without status migrainosus, not intractable 05/31/2020   Abdominal pain 03/10/2018   Sciatica of right side 06/11/2017   GERD (gastroesophageal reflux disease) 11/26/2016   Right Achilles tendinitis 08/15/2016   Hypertension 07/05/2016   Adhesive capsulitis of right shoulder 02/21/2016   Heel pain, bilateral 01/10/2016   Varicose veins of left lower extremity with pain 01/10/2016   History of colonic polyps    Diverticulosis of colon without hemorrhage    Hypothyroidism 08/10/2015   Chronic constipation 08/05/2015   Hyperlipidemia 08/05/2015    Past Surgical History:  Procedure Laterality Date   CATARACT EXTRACTION     COLONOSCOPY N/A 10/10/2015   Procedure: COLONOSCOPY;  Surgeon: Daneil Dolin, MD;  Location: [facility];  Service: Endoscopy;  Laterality: N/A;  245 - Interpreter scheduled, do NOT move    OB History     Gravida  2   Para  1   Term  1   Preterm      AB  1   Living         SAB  1   IAB      Ectopic       Multiple      Live Births  1            Home Medications    Prior to Admission medications   Medication Sig Start Date End Date Taking? Authorizing Provider  albuterol (VENTOLIN HFA) 108 (90 Base) MCG/ACT inhaler INHALE 2 PUFFS INTO THE LUNGS EVERY 6 (SIX) HOURS AS NEEDED FOR WHEEZING OR SHORTNESS OF BREATH. 07/22/20 07/22/21  Kennith Gain, MD  amLODipine (NORVASC) 5 MG tablet TAKE 1 TABLET (5 MG TOTAL) BY MOUTH DAILY. 09/13/20 09/13/21  Ladell Pier, MD  diclofenac Sodium (VOLTAREN) 1 % GEL APPLY 4 GRAMS TOPICALLY 4 TIMES DAILY. FOR JOINT PAIN 06/15/20 06/15/21  Pete Pelt, PA-C  EPINEPHrine 0.3 mg/0.3 mL IJ SOAJ injection INJECT 0.3 MG INTO THE MUSCLE AS NEEDED FOR ANAPHYLAXIS. AS NEEDED FOR LIFE-THREATENING ALLERGIC REACTIONS 04/29/20 04/29/21  Althea Charon, FNP  fluticasone (FLOVENT HFA) 110 MCG/ACT inhaler USE 2 PUFFS TWICE A DAY WITH SPACER TO HELP PREVENT COUGH AND WHEEZE 04/29/20 04/29/21  Althea Charon, FNP  ipratropium (ATROVENT) 0.06 % nasal spray Place 2 sprays into both nostrils in the morning and at bedtime. 12/19/20   Althea Charon, FNP  levothyroxine (SYNTHROID) 112 MCG tablet TAKE 1 TABLET (112 MCG TOTAL) BY MOUTH DAILY. 11/14/20 11/14/21  Karle Plumber  B, MD  linaclotide (LINZESS) 145 MCG CAPS capsule TAKE 1 CAPSULE (145 MCG TOTAL) BY MOUTH DAILY BEFORE BREAKFAST. 02/02/20 02/01/21  Daneil Dolin, MD  loratadine (CLARITIN) 10 MG tablet Take 1 tablet (10 mg total) by mouth daily. 12/19/20   Althea Charon, FNP  meloxicam (MOBIC) 15 MG tablet TAKE 1 TABLET (15 MG TOTAL) BY MOUTH DAILY. 09/13/20 09/13/21  Pete Pelt, PA-C  meloxicam (MOBIC) 15 MG tablet Take 1 tablet (15 mg total) by mouth daily. 12/29/20   Volney American, PA-C  montelukast (SINGULAIR) 10 MG tablet TAKE 1 TABLET (10 MG TOTAL) BY MOUTH AT BEDTIME. 12/19/20 12/19/21  Althea Charon, FNP  Olopatadine HCl 0.2 % SOLN Place 1 drop in each eye once a day as needed for itchy watery  eyes 12/19/20   Althea Charon, FNP  pantoprazole (PROTONIX) 40 MG tablet TAKE 1 TABLET (40 MG TOTAL) BY MOUTH DAILY. 02/02/20 02/01/21  Daneil Dolin, MD  topiramate (TOPAMAX) 25 MG tablet TAKE 1 TABLET (25 MG TOTAL) BY MOUTH AT BEDTIME. 05/31/20 05/31/21  Ladell Pier, MD  Wheat Dextrin (BENEFIBER) POWD Take by mouth 2 (two) times daily. Takes 2 tbsp twice daily    [provider]  fluticasone (FLONASE) 50 MCG/ACT nasal spray Place 2 sprays into both nostrils daily as needed for allergies or rhinitis. 07/06/19 03/17/20  Ladell Pier, MD    Family History Family History  Problem Relation Age of Onset   Diabetes Father    Hypertension Father    Asthma Father    Stroke Maternal Grandmother    Stroke Maternal Grandfather    Allergic rhinitis Mother    Colon cancer Neg Hx    Angioedema Neg Hx    Atopy Neg Hx    Immunodeficiency Neg Hx    Urticaria Neg Hx     Social History Social History   Tobacco Use   Smoking status: Never   Smokeless tobacco: Never  Vaping Use   Vaping Use: Never used  Substance Use Topics   Alcohol use: No    Alcohol/week: 0.0 standard drinks   Drug use: No     Allergies   Naproxen, Fish allergy, Penicillins, and Shellfish allergy   Review of Systems Review of Systems Per HPI  Physical Exam Triage Vital Signs ED Triage Vitals  Enc Vitals Group     BP 12/29/20 1303 (!) 154/89     Pulse Rate 12/29/20 1303 65     Resp 12/29/20 1303 18     Temp 12/29/20 1303 98.4 F (36.9 C)     Temp Source 12/29/20 1303 Oral     SpO2 12/29/20 1303 97 %     Weight --      Height --      Head Circumference --      Peak Flow --      Pain Score 12/29/20 1301 8     Pain Loc --      Pain Edu? --      Excl. in [place]? --    No data found.  Updated Vital Signs BP (!) 154/89 (BP Location: Left Arm)   Pulse 65   Temp 98.4 F (36.9 C) (Oral)   Resp 18   SpO2 97%   Visual Acuity Right Eye Distance:   Left Eye Distance:   Bilateral Distance:     Right Eye Near:   Left Eye Near:    Bilateral Near:     Physical Exam Vitals and nursing note  reviewed.  Constitutional:      Appearance: Normal appearance. She is not ill-appearing.  HENT:     Head: Atraumatic.     Mouth/Throat:     Mouth: Mucous membranes are moist.  Eyes:     Extraocular Movements: Extraocular movements intact.     Conjunctiva/sclera: Conjunctivae normal.  Cardiovascular:     Rate and Rhythm: Normal rate and regular rhythm.     Heart sounds: Normal heart sounds.  Pulmonary:     Effort: Pulmonary effort is normal.     Breath sounds: Normal breath sounds. No wheezing or rales.  Abdominal:     General: Bowel sounds are normal. There is no distension.     Palpations: Abdomen is soft.     Tenderness: There is no abdominal tenderness. There is no right CVA tenderness, left CVA tenderness or guarding.  Musculoskeletal:        General: Tenderness present. No swelling or deformity. Normal range of motion.     Cervical back: Normal range of motion and neck supple.     Comments: Right lumbar lateral musculature tender to palpation extending to posterior right buttock.  No midline spinal tenderness palpation bilaterally.  Negative straight leg raise bilaterally.  Normal gait and movements during exam  Skin:    General: Skin is warm and dry.  Neurological:     Mental Status: She is alert and oriented to person, place, and time.     Motor: No weakness.     Gait: Gait normal.  Psychiatric:        Mood and Affect: Mood normal.        Thought Content: Thought content normal.        Judgment: Judgment normal.     UC Treatments / Results  Labs (all labs ordered are listed, but only abnormal results are displayed) Labs Reviewed  POCT URINALYSIS DIPSTICK, ED / UC    EKG   Radiology No results found.  Procedures Procedures (including critical care time)  Medications Ordered in UC Medications - No data to display  Initial Impression / Assessment and Plan  / UC Course  I have reviewed the triage vital signs and the nursing notes.  Pertinent labs & imaging results that were available during my care of the patient were reviewed by me and considered in my medical decision making (see chart for details).     Very mildly hypertensive in triage but otherwise vital signs benign and within normal limits.  Exam very reassuring with no significant or alarming findings.  Possibly musculoskeletal nature of pain, meloxicam refilled and discussed stretches, Epsom salt soaks, strict return precautions.  Low likelihood of kidney stone given normal urinalysis, no apparent abdominal tenderness on exam or associated GI related symptoms.  Strict turn precautions given for worsening symptoms at any time.  Final Clinical Impressions(s) / UC Diagnoses   Final diagnoses:  Right flank pain  Sciatica of right side   Discharge Instructions   None    ED Prescriptions     Medication Sig Dispense Auth. Provider   meloxicam (MOBIC) 15 MG tablet Take 1 tablet (15 mg total) by mouth daily. 30 tablet Volney American, [place]      PDMP not reviewed this encounter.   Volney American, [place] 01/02/21 0900

## 2021-01-12 ENCOUNTER — Ambulatory Visit: Payer: Self-pay | Attending: Physician Assistant | Admitting: Physician Assistant

## 2021-01-12 ENCOUNTER — Encounter: Payer: Self-pay | Admitting: Physician Assistant

## 2021-01-12 ENCOUNTER — Other Ambulatory Visit: Payer: Self-pay

## 2021-01-12 VITALS — BP 136/83 | HR 71 | Temp 98.3°F | Resp 18 | Ht 60.0 in | Wt 168.0 lb

## 2021-01-12 DIAGNOSIS — Z791 Long term (current) use of non-steroidal anti-inflammatories (NSAID): Secondary | ICD-10-CM

## 2021-01-12 DIAGNOSIS — K219 Gastro-esophageal reflux disease without esophagitis: Secondary | ICD-10-CM

## 2021-01-12 DIAGNOSIS — R1011 Right upper quadrant pain: Secondary | ICD-10-CM

## 2021-01-12 DIAGNOSIS — K5909 Other constipation: Secondary | ICD-10-CM

## 2021-01-12 MED FILL — Linaclotide Cap 145 MCG: ORAL | 30 days supply | Qty: 30 | Fill #0 | Status: AC

## 2021-01-12 MED FILL — Pantoprazole Sodium EC Tab 40 MG (Base Equiv): ORAL | 30 days supply | Qty: 30 | Fill #1 | Status: AC

## 2021-01-12 NOTE — Progress Notes (Signed)
 Established Patient Office Visit  Subjective:  Patient ID: Alexandra Brewer, female    DOB: 07/30/1955  Age: 64 y.o. MRN: [id number]  CC:  Chief Complaint  Patient presents with   Abdominal Pain    Right side    HPI Alexandra Brewer reports that she was seen at urgent care on December 29, 2020.  Hospital note:  Medical interpreter utilized today to facilitate visit.  Patient presenting today with 2-week history of mild abdominal and right flank pain.  She denies associated nausea, vomiting, diarrhea, constipation, decreased appetite, fever, chills, dysuria, hematuria, urinary frequency, vaginal symptoms.  She states the pain occasionally travels down the back of her right leg and is worse with movement.  Unaware of any injury or history of similar issues.  So far not tried anything over-the-counter for symptoms. Very mildly hypertensive in triage but otherwise vital signs benign and within normal limits.  Exam very reassuring with no significant or alarming findings.  Possibly musculoskeletal nature of pain, meloxicam refilled and discussed stretches, Epsom salt soaks, strict return precautions.  Low likelihood of kidney stone given normal urinalysis, no apparent abdominal tenderness on exam or associated GI related symptoms.  Strict turn precautions given for worsening symptoms at any time.  States today that she continues to have the same pain since being seen in UC, states that the pain is constantly present.  States that she is having a hard time eating, states that it hurts after eating, and that she will have bloating and loose stools, but also endorses that this is after taking linzess.  She also taking protonix 40 mg on a daily basis.  States nothing makes it better .  Also endorses epigastric pain, heartburn and acid reflux, states this occurs after every meal despite taking protonix for the past year. Is taking Mobic 15 mg, approx twice a week.  Had RUQ US in 01/2017 with  normal study.   Due to language barrier, an interpreter was present during the history-taking and subsequent discussion (and for part of the physical exam) with this patient.   Past Medical History:  Diagnosis Date   Asthma    Constipation    Hyperlipemia    Sciatica    Thyroid disease    Urticaria     Past Surgical History:  Procedure Laterality Date   CATARACT EXTRACTION     COLONOSCOPY N/A 10/10/2015   Procedure: COLONOSCOPY;  Surgeon: Robert M Rourk, MD;  [facility]: [facility];  Service: Endoscopy;  Laterality: N/A;  245 - Interpreter scheduled, do NOT move    Family History  Problem Relation Age of Onset   Diabetes Father    Hypertension Father    Asthma Father    Stroke Maternal Grandmother    Stroke Maternal Grandfather    Allergic rhinitis Mother    Colon cancer Neg Hx    Angioedema Neg Hx    Atopy Neg Hx    Immunodeficiency Neg Hx    Urticaria Neg Hx     Social History   Socioeconomic History   Marital status: Single    Spouse name: Not on file   Number of children: Not on file   Years of education: Not on file   Highest education level: Not on file  Occupational History   Not on file  Tobacco Use   Smoking status: Never   Smokeless tobacco: Never  Vaping Use   Vaping Use: Never used  Substance and Sexual Activity   Alcohol use:   No    Alcohol/week: 0.0 standard drinks   Drug use: No   Sexual activity: Not Currently  Other Topics Concern   Not on file  Social History Narrative   Not on file   Social Determinants of Health   Financial Resource Strain: Not on file  Food Insecurity: Not on file  Transportation Needs: Not on file  Physical Activity: Not on file  Stress: Not on file  Social Connections: Not on file  Intimate Partner Violence: Not on file    Outpatient Medications Prior to Visit  Medication Sig Dispense Refill   albuterol (VENTOLIN HFA) 108 (90 Base) MCG/ACT inhaler INHALE 2 PUFFS INTO THE LUNGS EVERY 6 (SIX) HOURS AS  NEEDED FOR WHEEZING OR SHORTNESS OF BREATH. 18 g 0   amLODipine (NORVASC) 5 MG tablet TAKE 1 TABLET (5 MG TOTAL) BY MOUTH DAILY. 30 tablet 0   diclofenac Sodium (VOLTAREN) 1 % GEL APPLY 4 GRAMS TOPICALLY 4 TIMES DAILY. FOR JOINT PAIN 100 g 0   EPINEPHrine 0.3 mg/0.3 mL IJ SOAJ injection INJECT 0.3 MG INTO THE MUSCLE AS NEEDED FOR ANAPHYLAXIS. AS NEEDED FOR LIFE-THREATENING ALLERGIC REACTIONS 2 each 1   fluticasone (FLOVENT HFA) 110 MCG/ACT inhaler USE 2 PUFFS TWICE A DAY WITH SPACER TO HELP PREVENT COUGH AND WHEEZE 12 g 3   ipratropium (ATROVENT) 0.06 % nasal spray Place 2 sprays into both nostrils in the morning and at bedtime. 15 mL 5   levothyroxine (SYNTHROID) 112 MCG tablet TAKE 1 TABLET (112 MCG TOTAL) BY MOUTH DAILY. 90 tablet 1   linaclotide (LINZESS) 145 MCG CAPS capsule TAKE 1 CAPSULE (145 MCG TOTAL) BY MOUTH DAILY BEFORE BREAKFAST. 30 capsule 11   loratadine (CLARITIN) 10 MG tablet Take 1 tablet (10 mg total) by mouth daily. 30 tablet 3   meloxicam (MOBIC) 15 MG tablet TAKE 1 TABLET (15 MG TOTAL) BY MOUTH DAILY. 30 tablet 3   meloxicam (MOBIC) 15 MG tablet Take 1 tablet (15 mg total) by mouth daily. 30 tablet 0   montelukast (SINGULAIR) 10 MG tablet TAKE 1 TABLET (10 MG TOTAL) BY MOUTH AT BEDTIME. 30 tablet 5   Olopatadine HCl 0.2 % SOLN Place 1 drop in each eye once a day as needed for itchy watery eyes 2.5 mL 5   pantoprazole (PROTONIX) 40 MG tablet TAKE 1 TABLET (40 MG TOTAL) BY MOUTH DAILY. 30 tablet 11   topiramate (TOPAMAX) 25 MG tablet TAKE 1 TABLET (25 MG TOTAL) BY MOUTH AT BEDTIME. 30 tablet 4   Wheat Dextrin (BENEFIBER) POWD Take by mouth 2 (two) times daily. Takes 2 tbsp twice daily     No facility-administered medications prior to visit.    Allergies  Allergen Reactions   Naproxen Rash   Fish Allergy     Throat swell up   Penicillins    Shellfish Allergy     Throat swell up    ROS Review of Systems  Constitutional:  Negative for chills and fever.  HENT:  Negative.    Eyes: Negative.   Respiratory:  Negative for shortness of breath.   Cardiovascular:  Negative for chest pain.  Gastrointestinal:  Positive for abdominal pain. Negative for nausea and vomiting.  Endocrine: Negative.   Genitourinary: Negative.   Musculoskeletal:  Negative for back pain.  Skin: Negative.   Allergic/Immunologic: Negative.   Neurological: Negative.   Hematological: Negative.   Psychiatric/Behavioral: Negative.       Objective:    Physical Exam Vitals and nursing note reviewed.    Constitutional:      General: She is not in acute distress.    Appearance: She is well-developed. She is ill-appearing.  HENT:     Head: Normocephalic and atraumatic.     Mouth/Throat:     Mouth: Mucous membranes are moist.     Pharynx: Oropharynx is clear.  Eyes:     Extraocular Movements: Extraocular movements intact.     Pupils: Pupils are equal, round, and reactive to light.  Cardiovascular:     Rate and Rhythm: Normal rate and regular rhythm.     Pulses: Normal pulses.     Heart sounds: Normal heart sounds.  Pulmonary:     Effort: Pulmonary effort is normal.     Breath sounds: Normal breath sounds.  Abdominal:     General: Abdomen is flat. Bowel sounds are normal. There is no distension.     Palpations: Abdomen is soft.     Tenderness: There is abdominal tenderness in the right upper quadrant and epigastric area. There is no guarding. Negative signs include Murphy's sign.  Musculoskeletal:        General: Normal range of motion.     Cervical back: Normal range of motion and neck supple.  Skin:    General: Skin is warm and dry.  Neurological:     General: No focal deficit present.     Mental Status: She is alert and oriented to person, place, and time.  Psychiatric:        Mood and Affect: Mood normal.        Behavior: Behavior normal.        Thought Content: Thought content normal.        Judgment: Judgment normal.    BP 136/83 (BP Location: Left Arm,  Patient Position: Sitting, Cuff Size: Normal)   Pulse 71   Temp 98.3 F (36.8 C) (Oral)   Resp 18   Ht 5' (1.524 m)   Wt 168 lb (76.2 kg)   SpO2 99%   BMI 32.81 kg/m  Wt Readings from Last 3 Encounters:  01/12/21 168 lb (76.2 kg)  12/19/20 166 lb 6 oz (75.5 kg)  06/01/20 164 lb (74.4 kg)     Health Maintenance Due  Topic Date Due   COVID-19 Vaccine (1) Never done   Zoster Vaccines- Shingrix (1 of 2) Never done   INFLUENZA VACCINE  10/31/2020    There are no preventive care reminders to display for this patient.  Lab Results  Component Value Date   TSH 3.050 06/06/2020   Lab Results  Component Value Date   WBC 12.0 (H) 06/06/2020   HGB 13.5 06/06/2020   HCT 40.3 06/06/2020   MCV 86 06/06/2020   PLT 402 06/06/2020   Lab Results  Component Value Date   NA 139 06/06/2020   K 4.9 06/06/2020   CO2 27 06/06/2020   GLUCOSE 94 06/06/2020   BUN 13 06/06/2020   CREATININE 0.85 06/06/2020   BILITOT <0.2 06/06/2020   ALKPHOS 114 06/06/2020   AST 16 06/06/2020   ALT 15 06/06/2020   PROT 6.9 06/06/2020   ALBUMIN 4.3 06/06/2020   CALCIUM 9.2 06/06/2020   EGFR 77 06/06/2020   Lab Results  Component Value Date   CHOL 203 (H) 06/06/2020   Lab Results  Component Value Date   HDL 46 06/06/2020   Lab Results  Component Value Date   LDLCALC 97 06/06/2020   Lab Results  Component Value Date   TRIG 356 (H) 06/06/2020     Lab Results  Component Value Date   CHOLHDL 4.4 06/06/2020   Lab Results  Component Value Date   HGBA1C 5.4 09/13/2017      Assessment & Plan:   Problem List Items Addressed This Visit       Digestive   Chronic constipation (Chronic)   GERD (gastroesophageal reflux disease)   Other Visit Diagnoses     RUQ abdominal pain    -  Primary   Relevant Orders   US Abdomen Limited RUQ (LIVER/GB)   CBC with Differential/Platelet   Comp. Metabolic Panel (12)   Long term (current) use of non-steroidal anti-inflammatories (nsaid)        Relevant Orders   H Pylori, IGM, IGG, IGA AB      1. RUQ abdominal pain Continue Protonix and linzess as needed.  Patient ed given on gentle diet, supportive care, avoid NSAID at this time.  Red flags given for prompt reevaluation - US Abdomen Limited RUQ (LIVER/GB); Future - CBC with Differential/Platelet - Comp. Metabolic Panel (12)  2. Gastroesophageal reflux disease, unspecified whether esophagitis present  3. Chronic constipation   4. Long term (current) use of non-steroidal anti-inflammatories (nsaid)  - H Pylori, IGM, IGG, IGA AB   I have reviewed the patient's medical history (PMH, PSH, Social History, Family History, Medications, and allergies) , and have been updated if relevant. I spent 30 minutes reviewing chart and  face to face time with patient.    No orders of the defined types were placed in this encounter.   Follow-up: Return in about 2 weeks (around 01/26/2021).    Loraine Grip Mayers, PA-C

## 2021-01-12 NOTE — Patient Instructions (Addendum)
We will call you with your lab results and [place] results.  Kennieth Rad, PA-C Physician Assistant [facility] [email or web address]   Dieta Cristopher Estimable Diet Una dieta suave se compone de alimentos que, en general, son blandos y no tienen Wallace grasa, fibra ni condimentos adicionales. Para el cuerpo, es ms fcil digerir alimentos sin grasa, fibra o condimentos. Adems, es menos probable que estos causen [employer] boca, la garganta, el Harvey y otras partes de aparato digestivo. A menudo, se conoce a la dieta suave como dieta BRAT ([place], [place], Applesauce, [place], arroz, pur de [place] y pan tostado]). En qu consiste el plan? Su mdico o especialista en nutricin y alimentos (nutricionista) pueden recomendar cambios especficos en su dieta para evitar o tratar sus sntomas. Estos cambios pueden incluir lo siguiente: Consumir pequeas cantidades de comida con frecuencia. Cocinar los alimentos hasta que estn lo bastante blandos para masticarlos con facilidad. Masticar bien la comida. Beber lquidos lentamente. No consumir alimentos muy picantes, cidos o grasosos. No comer frutas ctricas, como naranjas y toronjas. Qu debo saber acerca de esta dieta? Consuma diferentes alimentos de la lista de alimentos de la dieta [place]. No siga una dieta suave durante ms tiempo del necesario. Pregntele a su mdico si debe tomar vitaminas o suplementos. Qu alimentos puedo comer? Cereales Cereales calientes, como crema de trigo. Arroz. Panes, galletas o tortillas elaborados con harina blanca refinada. Verduras Verduras cocidas o enlatadas. Pur de papas o papas hervidas. Lambert Mody Bananas. Pur de [employer]. Otros tipos de frutas cocidas o enlatadas peladas y sin semillas, por ejemplo, duraznos o peras en lata. Carnes y otras protenas Huevos revueltos. Lindon de man cremosa u otras mantequillas de frutos secos. Carnes  [employer] cocidas, como ave o pescado. Tofu. Sopas o caldos. Lcteos Productos lcteos sin grasa, como [place], queso cottage y Estate agent. [employer]. T de hierbas. Jugo de [place]. Grasas y aceites Aderezos suaves para ensaladas. Aceite de canola o de oliva. Dulces y postres Pudin. Natillas. Gelatina de frutas. Helados. Es posible que los productos mencionados arriba no formen una lista completa de las bebidas o los alimentos recomendados. Comunquese con un nutricionista para conocer ms opciones. Qu alimentos no se recomiendan? Cereales Panes y cereales integrales. Verduras Verduras crudas. Frutas Frutas crudas, especialmente los ctricos, frutos rojos o frutas secas. Lcteos Productos lcteos enteros. Bebidas Bebidas que contengan cafena. Alcohol. Alios y condimentos Aderezos o condimentos muy saborizados. Salsa picante. Salsa. Otros alimentos Comidas picantes. Comidas fritas. Alimentos cidos, como encurtidos o alimentos fermentados. Alimentos con alto contenido de Location manager. Alimentos ricos [employer]. Es posible que los productos que se enumeran ms arriba no sean una lista completa de los alimentos y las bebidas que se Higher education careers adviser. Comunquese con un nutricionista para obtener ms informacin. Resumen Una dieta suave se compone de alimentos que, en general, son blandos y no tienen Dewy Rose grasa, fibra ni condimentos adicionales. Para el cuerpo, es ms fcil digerir alimentos sin grasa, fibra o condimentos. Consulte a su mdico para ver cunto tiempo debe seguir este plan de alimentacin. Esta dieta no est destinada a seguirse [employer]. Esta informacin no tiene Marine scientist el consejo del mdico. Asegrese de hacerle al mdico cualquier pregunta que tenga. Document Revised: 05/23/2017 Document Reviewed: 05/23/2017 Elsevier Patient Education  2022 [employer].

## 2021-01-12 NOTE — Progress Notes (Signed)
Patient has had coffee with milk and cookie today and patient has taken medication today. Patient reports pain in the right side scaled at a 3. Patient denies pain with urinating and reports pain with bowel movements, patient last BM is consistent daily with loose stool with the feeling of not completing a full BM.

## 2021-01-15 LAB — CBC WITH DIFFERENTIAL/PLATELET
Basophils Absolute: 0.1 10*3/uL (ref 0.0–0.2)
Basos: 1 %
EOS (ABSOLUTE): 0.1 10*3/uL (ref 0.0–0.4)
Eos: 1 %
Hematocrit: 44.1 % (ref 34.0–46.6)
Hemoglobin: 14.1 g/dL (ref 11.1–15.9)
Immature Grans (Abs): 0 10*3/uL (ref 0.0–0.1)
Immature Granulocytes: 0 %
Lymphocytes Absolute: 1.6 10*3/uL (ref 0.7–3.1)
Lymphs: 25 %
MCH: 27.8 pg (ref 26.6–33.0)
MCHC: 32 g/dL (ref 31.5–35.7)
MCV: 87 fL (ref 79–97)
Monocytes Absolute: 0.5 10*3/uL (ref 0.1–0.9)
Monocytes: 8 %
Neutrophils Absolute: 4.3 10*3/uL (ref 1.4–7.0)
Neutrophils: 65 %
Platelets: 388 10*3/uL (ref 150–450)
RBC: 5.07 x10E6/uL (ref 3.77–5.28)
RDW: 13.1 % (ref 11.7–15.4)
WBC: 6.6 10*3/uL (ref 3.4–10.8)

## 2021-01-15 LAB — COMP. METABOLIC PANEL (12)
AST: 17 IU/L (ref 0–40)
Albumin/Globulin Ratio: 2.3 — ABNORMAL HIGH (ref 1.2–2.2)
Albumin: 5.2 g/dL — ABNORMAL HIGH (ref 3.8–4.8)
Alkaline Phosphatase: 129 IU/L — ABNORMAL HIGH (ref 44–121)
BUN/Creatinine Ratio: 14 (ref 12–28)
BUN: 9 mg/dL (ref 8–27)
Bilirubin Total: 0.3 mg/dL (ref 0.0–1.2)
Calcium: 9.7 mg/dL (ref 8.7–10.3)
Chloride: 103 mmol/L (ref 96–106)
Creatinine, Ser: 0.65 mg/dL (ref 0.57–1.00)
Globulin, Total: 2.3 g/dL (ref 1.5–4.5)
Glucose: 89 mg/dL (ref 70–99)
Potassium: 4.9 mmol/L (ref 3.5–5.2)
Sodium: 141 mmol/L (ref 134–144)
Total Protein: 7.5 g/dL (ref 6.0–8.5)
eGFR: 99 mL/min/{1.73_m2} (ref >59)

## 2021-01-15 LAB — H PYLORI, IGM, IGG, IGA AB
H pylori, IgM Abs: <9 units (ref 0.0–8.9)
H. pylori, IgA Abs: <9 units (ref 0.0–8.9)
H. pylori, IgG AbS: 0.2 Index Value (ref 0.00–0.79)

## 2021-01-19 ENCOUNTER — Ambulatory Visit (HOSPITAL_COMMUNITY)
Admission: RE | Admit: 2021-01-19 | Discharge: 2021-01-19 | Disposition: A | Discharge status: Home / Self Care | Payer: Self-pay | Source: Ambulatory Visit | Attending: Physician Assistant | Admitting: Physician Assistant

## 2021-01-19 ENCOUNTER — Other Ambulatory Visit: Payer: Self-pay

## 2021-01-19 DIAGNOSIS — R1011 Right upper quadrant pain: Secondary | ICD-10-CM | POA: Insufficient documentation

## 2021-01-20 NOTE — Addendum Note (Signed)
Addended by: Kennieth Rad on: 01/20/2021 01:19 PM   Modules accepted: Orders

## 2021-01-25 ENCOUNTER — Encounter: Payer: Self-pay | Admitting: Physician Assistant

## 2021-01-25 ENCOUNTER — Ambulatory Visit: Payer: Self-pay | Attending: Physician Assistant | Admitting: Physician Assistant

## 2021-01-25 ENCOUNTER — Other Ambulatory Visit: Payer: Self-pay

## 2021-01-25 VITALS — BP 127/84 | HR 74 | Temp 98.9°F | Resp 18 | Ht 60.0 in | Wt 162.0 lb

## 2021-01-25 DIAGNOSIS — Z6831 Body mass index (BMI) 31.0-31.9, adult: Secondary | ICD-10-CM

## 2021-01-25 DIAGNOSIS — K8020 Calculus of gallbladder without cholecystitis without obstruction: Secondary | ICD-10-CM

## 2021-01-25 DIAGNOSIS — K760 Fatty (change of) liver, not elsewhere classified: Secondary | ICD-10-CM

## 2021-01-25 DIAGNOSIS — E6609 Other obesity due to excess calories: Secondary | ICD-10-CM

## 2021-01-25 MED FILL — Fluticasone Propionate HFA Inhal Aer 110 MCG/ACT: RESPIRATORY_TRACT | 30 days supply | Qty: 12 | Fill #1 | Status: AC

## 2021-01-25 NOTE — Progress Notes (Signed)
Patient received results during followup

## 2021-01-25 NOTE — Progress Notes (Signed)
Established Patient Office Visit  Subjective:  Patient ID: Alexandra Brewer, female    DOB: 03-Apr-1955  Age: 65 y.o. MRN: [id number]  CC:  Chief Complaint  Patient presents with   Follow-up    HPI Alexandra Brewer states today that she continues to have RUQ abdominal pain, continues to take protonix and linzess as directed.  Did have RUQ ultrasound completed:   IMPRESSION: 1. Stones and sludge fill the gallbladder without wall thickening, pericholecystic fluid, or Murphy's sign. 2. Increased echogenicity in the liver is nonspecific but often due to hepatic steatosis. No other abnormalities.     Electronically Signed   By: Dorise Bullion III M.D.   On: 01/20/2021 12:21    Note   Sent Ref[facility] to [facility] on November  They will contact the patient to schedule an appointment with the specialist through the gccn card after the notes been reviewed by the specialist.       Due to language barrier, an interpreter was present during the history-taking and subsequent discussion (and for part of the physical exam) with this patient.    Past Medical History:  Diagnosis Date   Asthma    Constipation    Hyperlipemia    Sciatica    Thyroid disease    Urticaria     Past Surgical History:  Procedure Laterality Date   CATARACT EXTRACTION     COLONOSCOPY N/A 10/10/2015   Procedure: COLONOSCOPY;  Surgeon: Daneil Dolin, MD;  [facility];  Service: Endoscopy;  Laterality: N/A;  33 - Interpreter scheduled, do NOT move    Family History  Problem Relation Age of Onset   Diabetes Father    Hypertension Father    Asthma Father    Stroke Maternal Grandmother    Stroke Maternal Grandfather    Allergic rhinitis Mother    Colon cancer Neg Hx    Angioedema Neg Hx    Atopy Neg Hx    Immunodeficiency Neg Hx    Urticaria Neg Hx     Social History   Socioeconomic History   Marital status: Single    Spouse name: Not on file   Number of children: Not on file    Years of education: Not on file   Highest education level: Not on file  Occupational History   Not on file  Tobacco Use   Smoking status: Never   Smokeless tobacco: Never  Vaping Use   Vaping Use: Never used  Substance and Sexual Activity   Alcohol use: No    Alcohol/week: 0.0 standard drinks   Drug use: No   Sexual activity: Not Currently  Other Topics Concern   Not on file  Social History Narrative   Not on file   Social Determinants of Health   Financial Resource Strain: Not on file  Food Insecurity: Not on file  Transportation Needs: Not on file  Physical Activity: Not on file  Stress: Not on file  Social Connections: Not on file  Intimate Partner Violence: Not on file    Outpatient Medications Prior to Visit  Medication Sig Dispense Refill   albuterol (VENTOLIN HFA) 108 (90 Base) MCG/ACT inhaler INHALE 2 PUFFS INTO THE LUNGS EVERY 6 (SIX) HOURS AS NEEDED FOR WHEEZING OR SHORTNESS OF BREATH. 18 g 0   amLODipine (NORVASC) 5 MG tablet TAKE 1 TABLET (5 MG TOTAL) BY MOUTH DAILY. 30 tablet 0   diclofenac Sodium (VOLTAREN) 1 % GEL APPLY 4 GRAMS TOPICALLY 4 TIMES DAILY. FOR  JOINT PAIN 100 g 0   EPINEPHrine 0.3 mg/0.3 mL IJ SOAJ injection INJECT 0.3 MG INTO THE MUSCLE AS NEEDED FOR ANAPHYLAXIS. AS NEEDED FOR LIFE-THREATENING ALLERGIC REACTIONS 2 each 1   fluticasone (FLOVENT HFA) 110 MCG/ACT inhaler USE 2 PUFFS TWICE A DAY WITH SPACER TO HELP PREVENT COUGH AND WHEEZE 12 g 3   ipratropium (ATROVENT) 0.06 % nasal spray Place 2 sprays into both nostrils in the morning and at bedtime. 15 mL 5   levothyroxine (SYNTHROID) 112 MCG tablet TAKE 1 TABLET (112 MCG TOTAL) BY MOUTH DAILY. 90 tablet 1   linaclotide (LINZESS) 145 MCG CAPS capsule TAKE 1 CAPSULE (145 MCG TOTAL) BY MOUTH DAILY BEFORE BREAKFAST. 30 capsule 11   loratadine (CLARITIN) 10 MG tablet Take 1 tablet (10 mg total) by mouth daily. 30 tablet 3   meloxicam (MOBIC) 15 MG tablet TAKE 1 TABLET (15 MG TOTAL) BY MOUTH DAILY. 30  tablet 3   meloxicam (MOBIC) 15 MG tablet Take 1 tablet (15 mg total) by mouth daily. 30 tablet 0   montelukast (SINGULAIR) 10 MG tablet TAKE 1 TABLET (10 MG TOTAL) BY MOUTH AT BEDTIME. 30 tablet 5   Olopatadine HCl 0.2 % SOLN Place 1 drop in each eye once a day as needed for itchy watery eyes 2.5 mL 5   pantoprazole (PROTONIX) 40 MG tablet TAKE 1 TABLET (40 MG TOTAL) BY MOUTH DAILY. 30 tablet 11   topiramate (TOPAMAX) 25 MG tablet TAKE 1 TABLET (25 MG TOTAL) BY MOUTH AT BEDTIME. 30 tablet 4   Wheat Dextrin (BENEFIBER) POWD Take by mouth 2 (two) times daily. Takes 2 tbsp twice daily     No facility-administered medications prior to visit.    Allergies  Allergen Reactions   Naproxen Rash   Fish Allergy     Throat swell up   Penicillins    Shellfish Allergy     Throat swell up    ROS Review of Systems  Constitutional:  Negative for chills and fever.  HENT: Negative.    Eyes: Negative.   Respiratory:  Negative for shortness of breath.   Cardiovascular:  Negative for chest pain.  Gastrointestinal:  Positive for abdominal pain. Negative for constipation, diarrhea, nausea and vomiting.  Endocrine: Negative.   Genitourinary: Negative.   Musculoskeletal: Negative.   Skin: Negative.   Allergic/Immunologic: Negative.   Neurological: Negative.   Hematological: Negative.   Psychiatric/Behavioral: Negative.       Objective:    Physical Exam Vitals and nursing note reviewed.  Constitutional:      Appearance: Normal appearance. She is obese.  HENT:     Head: Normocephalic and atraumatic.     Right Ear: External ear normal.     Left Ear: External ear normal.     Nose: Nose normal.     Mouth/Throat:     Mouth: Mucous membranes are moist.     Pharynx: Oropharynx is clear.  Eyes:     Extraocular Movements: Extraocular movements intact.     Conjunctiva/sclera: Conjunctivae normal.     Pupils: Pupils are equal, round, and reactive to light.  Cardiovascular:     Rate and Rhythm:  Normal rate and regular rhythm.     Pulses: Normal pulses.     Heart sounds: Normal heart sounds.  Pulmonary:     Effort: Pulmonary effort is normal.     Breath sounds: Normal breath sounds.  Musculoskeletal:        General: Normal range of motion.  Cervical back: Normal range of motion and neck supple.  Skin:    General: Skin is warm and dry.  Neurological:     General: No focal deficit present.     Mental Status: She is alert and oriented to person, place, and time.  Psychiatric:        Mood and Affect: Mood normal.        Behavior: Behavior normal.        Thought Content: Thought content normal.        Judgment: Judgment normal.    BP 127/84 (BP Location: Left Arm, Patient Position: Sitting, Cuff Size: Normal)   Pulse 74   Temp 98.9 F (37.2 C) (Oral)   Resp 18   Ht 5' (1.524 m)   Wt 162 lb (73.5 kg)   SpO2 98%   BMI 31.64 kg/m  Wt Readings from Last 3 Encounters:  01/25/21 162 lb (73.5 kg)  01/12/21 168 lb (76.2 kg)  12/19/20 166 lb 6 oz (75.5 kg)     Health Maintenance Due  Topic Date Due   COVID-19 Vaccine (1) Never done   Pneumococcal Vaccine 48-72 Years old (1 - PCV) Never done   Zoster Vaccines- Shingrix (1 of 2) Never done   INFLUENZA VACCINE  10/31/2020    There are no preventive care reminders to display for this patient.  Lab Results  Component Value Date   TSH 3.050 06/06/2020   Lab Results  Component Value Date   WBC 6.6 01/12/2021   HGB 14.1 01/12/2021   HCT 44.1 01/12/2021   MCV 87 01/12/2021   PLT 388 01/12/2021   Lab Results  Component Value Date   NA 141 01/12/2021   K 4.9 01/12/2021   CO2 27 06/06/2020   GLUCOSE 89 01/12/2021   BUN 9 01/12/2021   CREATININE 0.65 01/12/2021   BILITOT 0.3 01/12/2021   ALKPHOS 129 (H) 01/12/2021   AST 17 01/12/2021   ALT 15 06/06/2020   PROT 7.5 01/12/2021   ALBUMIN 5.2 (H) 01/12/2021   CALCIUM 9.7 01/12/2021   EGFR 99 01/12/2021   Lab Results  Component Value Date   CHOL 203 (H)  06/06/2020   Lab Results  Component Value Date   HDL 46 06/06/2020   Lab Results  Component Value Date   LDLCALC 97 06/06/2020   Lab Results  Component Value Date   TRIG 356 (H) 06/06/2020   Lab Results  Component Value Date   CHOLHDL 4.4 06/06/2020   Lab Results  Component Value Date   HGBA1C 5.4 09/13/2017      Assessment & Plan:   Problem List Items Addressed This Visit   None Visit Diagnoses     Fatty liver    -  Primary   Calculus of gallbladder without cholecystitis without obstruction           No orders of the defined types were placed in this encounter.  1. Calculus of gallbladder without cholecystitis without obstruction Referral has been placed on patient's behalf for evaluation by general surgery, patient is working on applying for [employer] health financial assistance at this time.  Patient education given on supportive care, lifestyle modifications.  Red flags given for prompt reevaluation.  2. Fatty liver Patient is being followed by gastroenterology  3. Class 1 obesity due to excess calories with serious comorbidity and body mass index (BMI) of 31.0 to 31.9 in adult    I have reviewed the patient's medical history (PMH, PSH, Social History, Family  History, Medications, and allergies) , and have been updated if relevant. I spent 20 minutes reviewing chart and  face to face time with patient.    Follow-up: Return if symptoms worsen or fail to improve.    Loraine Grip Mayers, PA-C

## 2021-01-25 NOTE — Progress Notes (Signed)
Patient has eaten and taken medication today. Patient denies pain at this time. Patient is aware of results and received a copy in hand.

## 2021-01-25 NOTE — Patient Instructions (Signed)
Plan de alimentacin para problemas de vescula biliar Gallbladder Eating Plan Si tiene una afeccin de la vescula biliar, puede tener problemas para digerir las grasas. Consumir una dieta con bajo contenido de grasas puede [employer] sntomas, y puede ser beneficiosa antes y despus de [place] de extraccin de vescula biliar (colecistectoma). El mdico puede recomendarle que trabaje con un especialista en dietas y alimentacin (nutricionista) para que lo ayude a reducir la cantidad de grasas en su dieta. Consejos para seguir este plan [place] generales Limite el consumo de grasas a menos del 30 % del total de caloras diarias. Si usted ingiere alrededor de 1800 caloras diarias, esto es menos de 60 gramos (g) de Materials engineer. La grasa es una parte importante de una dieta saludable. Consumir una dieta con bajo contenido de grasas puede dificultar mantener un peso corporal saludable. Pregunte a su nutricionista qu cantidad de grasas, caloras y otros nutrientes necesita diariamente. Haga comidas pequeas y frecuentes Medical sales representative de tres comidas abundantes. Beba de 8 a 10 vasos de lquido por [employer]. Beba suficiente lquido como para mantener la orina clara o de color amarillo plido. Limite el consumo de alcohol a no ms de 1 medida por da si es mujer y no est Music therapist, y a 2 medidas por da si es hombre. Una medida equivale a 12 oz de cerveza, 5 oz de vino o 1 oz de bebidas alcohlicas de alta graduacin. Al leer las etiquetas de los alimentos  Consulte la informacin nutricional en las etiquetas de los alimentos para conocer la cantidad de grasas por porcin. Elija alimentos con menos de 3 gramos de grasas por porcin. Al ir de compras Elija alimentos saludables sin grasas o con bajo contenido de grasas. Busque las palabras "sin grasa", "bajo en grasas" o "con bajo contenido de grasas". Evite comprar alimentos procesados o preenvasados. Al cocinar Para cocinar  opte por mtodos con bajo contenido de grasa, como hornear, hervir, Interior and spatial designer y Holiday representative. Cocine con pequeas cantidades de grasas saludables, como aceite de [place], aceite de semilla de [place], aceite de canola o [place]. Qu alimentos se recomiendan? [place] y verduras frescas, congeladas o enlatadas. Cereales integrales. Leche y yogur semidescremados y descremados. Lysbeth Galas, aves sin piel, pescado, huevos y legumbres. Suplementos proteicos con bajo contenido de grasas, en polvo o lquidos. Hierbas y especias. Qu alimentos no se recomiendan? Alimentos muy grasos. Entre estos se incluyen productos panificados, comida rpida, cortes de carne con grasa, helados, pan francs, rosquillas dulces, pizza, pan de queso, alimentos cubiertos con [place], salsas con crema o queso. Comidas fritas. Se incluyen papas fritas, tempura, pescado rebozado, milanesas de pollo, panes fritos y dulces. Alimentos con [employer]. Alimentos que causan gases o meteorismo. Resumen Una dieta de bajo contenido graso puede ser beneficiosa si tiene una afeccin de la vescula biliar o puede hacerla antes y despus de someterse a una ciruga de vescula. Limite el consumo de grasas a menos del 30 % del total de caloras diarias. Esto es casi 60 gramos de grasa si usted ingiere 1800 caloras diarias. Haga comidas pequeas y frecuentes Medical sales representative de tres comidas abundantes. Esta informacin no tiene Marine scientist el consejo del mdico. Asegrese de hacerle al mdico cualquier pregunta que tenga.  Colelitiasis Cholelithiasis La colelitiasis es una enfermedad en la que se forman clculos biliares en la vescula biliar. La vescula biliar es un rgano que almacena bilis. La bilis es un lquido que interviene  en la digestin de las grasas. Los clculos comienzan como pequeos cristales y lentamente pueden transformarse en piedras. Es posible que no causen sntomas hasta que obstruyen el conducto de la  vescula biliar o el conducto qustico, cuando la vescula biliar se tensa (contrae) despus de comer alimentos. Esto puede causar dolor y se conoce como ataque de vescula biliar o clico biliar. Hay dos tipos principales de clculos biliares: Clculos de colesterol. Estos son el tipo de clculo biliar ms frecuente. Estos clculos se forman por el colesterol endurecido y generalmente son de color amarillo verdoso. El colesterol es una sustancia parecida a la grasa que se forma en el hgado. Clculos de pigmento. Estos son de color oscuro y estn formados por una sustancia amarillo rojiza, llamada bilirrubina,que se forma cuando la hemoglobina de los glbulos rojos se descompone. Cules son las causas? La causa de esta afeccin puede ser un desequilibrio en las diferentes partes que producen la bilis. Esto puede suceder si la bilis: Tiene mucha bilirrubina. Esto puede suceder en ciertas enfermedades de la sangre, como la anemia drepanoctica. Tiene mucho colesterol. No tiene suficiente sales biliares. Estas sales le ayudan al organismo a Tax adviser y a Chief Financial Officer. En ciertos casos, esta afeccin tambin puede ser causada por una vescula biliar que no se vaca completamente o lo suficientemente seguido. Esto es frecuente Solicitor. Qu incrementa el riesgo? Los siguientes factores pueden hacer que sea ms propenso a Emergency planning/management officer afeccin: Ser mujer. Tener embarazos mltiples. Algunas veces los mdicos aconsejan extirpar los clculos biliares antes de futuros embarazos. Tener una dieta con demasiadas comidas fritas, grasas y carbohidratos refinados, como el pan blanco y el arroz blanco. Ser obeso. Ser mayor de 17 aos de edad. Usar medicamentos que contienen hormonas femeninas (estrgenos) durante un Environmental education officer. Perder peso con rapidez. Antecedentes familiares de clculos biliares. Tener ciertos problemas mdicos, por ejemplo: Diabetes mellitus. Fibrosis  qustica. Enfermedad de Crohn. Cirrosis u otra enfermedad heptica a largo plazo (crnica). Determinadas enfermedades de la sangre, como anemia drepanoctica o leucemia. Cules son los signos o sntomas? En muchos casos, tener clculos biliares no causa sntomas. Si tiene clculos biliares pero no tiene sntomas, tiene clculos silenciosos. Si un clculo biliar bloquea el conducto biliar, puede causar un ataque de vescula biliar. El principal sntoma de un ataque de vescula biliar es un dolor repentino en la parte superior derecha del abdomen. El dolor: Aparece generalmente a la noche o despus de comer comidas abundantes. Puede durar Ardelia Mems hora o ms. Se puede extender hacia el hombro derecho, la espalda o el pecho. Puede sentirse como indigestin. Esto es [place], ardor o sensacin de plenitud en la parte superior del abdomen. Si el conducto biliar est bloqueado durante ms de algunas horas, puede causar infeccin o inflamacin de la vescula biliar (colecistitis), del hgado o del pncreas. Esto puede causar lo siguiente: Nuseas o vmitos. Distensin. Dolor abdominal que dura 5 horas o ms. Dolor con la palpacin en la parte superior del abdomen, a menudo en la seccin superior derecha y debajo de la caja torcica. Fiebre o escalofros. La piel o las partes blancas de los ojos se ponen amarillas (ictericia). En general esto ocurre cuando una piedra ha obstruido el paso de la bilis a travs del conducto biliar comn. Orina de color oscuro o heces de color plido. Cmo se diagnostica? Esta afeccin se puede diagnosticar en funcin de lo siguiente: Un examen fsico. Sus antecedentes mdicos. Una ecografa. Exploracin por tomografa computarizada (TC). Resonancia magntica (  RM). Tambin pueden hacerle otras pruebas, incluidas las siguientes: Anlisis de sangre para detectar signos de infeccin o inflamacin. Una colecistografa o gammagrafa hepatobiliar HIDA. Este es un estudio  de la vescula biliar y los conductos biliares (sistema biliar) que Environmental consultant material radiactivo no daino y Teacher, early years/pre que pueden Forensic psychologist radiactivo. Colangiopancreatografa retrgrada endoscpica. Este [employer] en insertar un pequeo tubo con una cmara en el extremo (endoscopio) a travs de la boca para observar los conductos biliares y Hydrographic surveyor obstrucciones. Cmo se trata? El tratamiento de esta afeccin depende de la gravedad. Los clculos silenciosos no requieren Clinical research associate. Si una obstruccin causa un ataque de vescula biliar u otros sntomas, puede ser necesario Electrical engineer. El tratamiento puede incluir: Cuidados en el hogar, si los sntomas no son graves. Durante un ataque simple de vescula biliar, deje de comer y beber durante 12 a 24 horas (excepto agua y lquidos transparentes). Esto ayuda a "enfriar" la vescula biliar. Despus de 1 o 2 das, puede comenzar a consumir alimentos simples o transparentes, como caldos y Administrator. Tambin es posible que necesite medicamentos para el dolor o las nuseas, o para ambos. Si tiene colecistitis y [place] infeccin, Designer, industrial/product antibiticos. Hospitalizacin, si es necesaria para controlar del dolor o en caso de colecistitis con infeccin grave. Una colecistectoma, o ciruga para extirpar la vescula biliar. Este es el tratamiento ms frecuente si todos los dems tratamientos no han funcionado. Medicamentos para destruir los clculos biliares. Estos son ms efectivos para tratar clculos pequeos. Los medicamentos pueden usarse durante hasta 6 a 12 meses. Colangiopancreatografa retrgrada endoscpica. Se puede anexar una pequea cesta al endoscopio y usarse para capturar y eliminar los clculos, especialmente los que se encuentran en el conducto biliar comn. Siga estas instrucciones en su casa: Medicamentos Use los medicamentos de venta libre y los recetados solamente como se lo haya indicado el mdico. Si le  recetaron un antibitico, tmelo como se lo haya indicado el mdico. No deje de tomar el antibitico aunque comience a sentirse mejor. Pregntele al mdico si el medicamento recetado le impide conducir o usar [place]. Comida y bebida Beba suficiente lquido como para Theatre manager la orina de color amarillo plido. Esto es importante durante un ataque de vescula biliar. Es preferible tomar agua y lquidos claros. Siga una dieta saludable. Esto puede comprender lo siguiente: Reducir los [employer], como los alimentos fritos y los alimentos con alto contenido de Research officer, trade union. Reducir los carbohidratos refinados, como el pan blanco y el arroz blanco. Consumir ms [place]. Alimentarse con alimentos como almendras, frutas y frijoles. Consumo de alcohol Si bebe alcohol: Limite la cantidad que bebe: De 0 a 1 medida por da para las mujeres que no estn embarazadas. De 0 a 2 medidas por da para los hombres. Est atento a la cantidad de alcohol que hay en las bebidas que toma. En los [place], una medida equivale a una botella de cerveza de 12 oz (355 ml), un vaso de vino de 5 oz (148 ml) o un vaso de una bebida alcohlica de alta graduacin de 1 oz (44 ml). Instrucciones generales No consuma ningn producto que contenga nicotina o tabaco, como cigarrillos, cigarrillos electrnicos y tabaco de Higher education careers adviser. Si necesita ayuda para dejar de fumar, consulte al mdico. Mantenga un peso saludable. Concurra a todas las visitas de seguimiento como se lo haya indicado el mdico. Estas pueden incluir consultas con un cirujano o un especialista. Esto es importante. Dnde buscar ms informacin [employer] of Diabetes and Digestive and Kidney  Diseases ([place] de la Diabetes y [place] y [place]): [email or web address] Comunquese con un mdico si: Piensa que ha tenido un ataque de vescula biliar. Le han diagnosticado clculos silenciosos y Scientist, clinical (histocompatibility and immunogenetics) en el abdomen o  indigestin. Comienza a tener ataques con ms frecuencia. Orina de color oscuro o tiene heces de color plido. Solicite ayuda de inmediato si: Tiene dolor por un ataque de vescula biliar que dura ms de 2 horas. Tiene dolor en el abdomen que dura ms de 5 horas o est empeorando. Tiene fiebre o escalofros. Tiene nuseas y vmitos que no desaparecen. Tiene ictericia. Resumen La colelitiasis es una enfermedad en la que se forman clculos biliares en la vescula biliar. La causa de esta afeccin puede ser un desequilibrio en las diferentes partes que producen la bilis. Esto puede suceder si la bilis tiene demasiado colesterol o bilirrubina, o contiene cantidad insuficiente de sales biliares. El tratamiento de los clculos biliares depende de la gravedad de la afeccin. Los clculos silenciosos no requieren Clinical research associate. Si los clculos causan un ataque de vescula biliar u otros sntomas, el tratamiento generalmente implica no comer ni beber nada. El tratamiento tambin puede incluir analgsicos y antibiticos, y a Media planner. La ciruga para extirpar la vescula biliar es frecuente si todos los dems tratamientos no han funcionado. Esta informacin no tiene Marine scientist el consejo del mdico. Asegrese de hacerle al mdico cualquier pregunta que tenga. Document Revised: 04/24/2019 Document Reviewed: 04/24/2019 Elsevier Patient Education  2022 [employer].

## 2021-01-27 DIAGNOSIS — K760 Fatty (change of) liver, not elsewhere classified: Secondary | ICD-10-CM | POA: Insufficient documentation

## 2021-01-27 DIAGNOSIS — Z6831 Body mass index (BMI) 31.0-31.9, adult: Secondary | ICD-10-CM | POA: Insufficient documentation

## 2021-01-27 DIAGNOSIS — E6609 Other obesity due to excess calories: Secondary | ICD-10-CM | POA: Insufficient documentation

## 2021-01-27 DIAGNOSIS — K8020 Calculus of gallbladder without cholecystitis without obstruction: Secondary | ICD-10-CM | POA: Insufficient documentation

## 2021-01-30 ENCOUNTER — Ambulatory Visit: Payer: No Typology Code available for payment source | Admitting: Family

## 2021-01-31 ENCOUNTER — Other Ambulatory Visit: Payer: Self-pay

## 2021-01-31 ENCOUNTER — Ambulatory Visit: Payer: Self-pay | Admitting: *Deleted

## 2021-01-31 ENCOUNTER — Ambulatory Visit
Admission: RE | Admit: 2021-01-31 | Discharge: 2021-01-31 | Disposition: A | Discharge status: Home / Self Care | Payer: No Typology Code available for payment source | Source: Ambulatory Visit | Attending: Internal Medicine | Admitting: Internal Medicine

## 2021-01-31 VITALS — BP 138/82 | Wt 162.1 lb

## 2021-01-31 DIAGNOSIS — Z1239 Encounter for other screening for malignant neoplasm of breast: Secondary | ICD-10-CM

## 2021-01-31 DIAGNOSIS — Z1211 Encounter for screening for malignant neoplasm of colon: Secondary | ICD-10-CM

## 2021-01-31 NOTE — Patient Instructions (Signed)
Explained breast self awareness with [name]. Patient did not need a Pap smear today due to last Pap smear and HPV typing was 03/06/2019. Let her know BCCCP will cover Pap smears and HPV typing every 5 years unless has a history of abnormal Pap smears. Referred patient to the [facility] for a screening mammogram on mobile unit. Appointment scheduled Tuesday, January 31, 2021 at 1140. Patient escorted to the mobile following BCCCP appointment for her screening mammogram. Let patient know the [facility] will follow up with her within the next couple weeks with results of her mammogram by letter or phone. Alexandra Brewer verbalized understanding.  Julia Alkhatib, Arvil Chaco, RN 11:27 AM

## 2021-01-31 NOTE — Progress Notes (Signed)
Alexandra Brewer is a 65 y.o. female who presents to [facility] clinic today with no complaints.    Pap Smear: Pap smear not completed today. Last Pap smear was 03/06/2019 at [facility] and was normal with negative HPV. Per patient has no history of an abnormal Pap smear. Last Pap smear result is available in Epic.   Physical exam: Breasts Breasts symmetrical. No skin abnormalities bilateral breasts. No nipple retraction bilateral breasts. No nipple discharge bilateral breasts. No lymphadenopathy. No lumps palpated bilateral breasts. No complaints of pain or tenderness on exam.  MM DIAG BREAST TOMO BILATERAL  Result Date: 04/05/2016 CLINICAL DATA:  Small mass felt by the patient in the skin of the left areola for the past year, larger over the past 3 weeks. EXAM: 2D DIGITAL DIAGNOSTIC BILATERAL MAMMOGRAM WITH CAD AND ADJUNCT TOMO ULTRASOUND LEFT BREAST COMPARISON:  10/08/2007 at [facility] Breast Density Category b: There are scattered areas of fibroglandular density. FINDINGS: 2D and 3D tomographic images of the breasts demonstrate an interval mild diffuse decrease in density of both breasts. No interval findings suspicious for malignancy in the breast. Mammographic images were processed with CAD. On physical exam, there is an approximately 5 mm oval, palpable mass in the 3 o'clock retroareolar left breast at the outer margin of the nipple. There are no palpable left axillary lymph nodes. Targeted ultrasound is performed, showing a 6 x 6 x 4 mm oval, horizontally oriented, circumscribed, hypoechoic mass in the posterior aspect of the left areola and in the underlying subcutaneous tissues at the lateral margin of the left nipple in the 3 o'clock position. Internal blood flow was demonstrated with power Doppler. Ultrasound of the left axilla demonstrated no abnormal appearing left axillary lymph nodes. IMPRESSION: 6 mm solid-appearing mass with internal blood  flow in the 3 o'clock retroareolar left breast. This could represent a small papilloma or an epidermal inclusion cyst. Malignancy is less likely but not excluded. RECOMMENDATION: Ultrasound-guided core needle biopsy of the 6 mm 3 o'clock retroareolar left breast mass. This has been discussed with the patient scheduled at 12:45 p.m. on 04/16/2016. I have discussed the findings and recommendations with the patient. Results were also provided in writing at the conclusion of the visit. If applicable, a reminder letter will be sent to the patient regarding the next appointment. BI-RADS CATEGORY  4: Suspicious. Electronically Signed   By: Claudie Revering M.D.   On: 04/05/2016 17:12   MS DIGITAL SCREENING TOMO BILATERAL  Result Date: 05/04/2019 CLINICAL DATA:  Screening. EXAM: DIGITAL SCREENING BILATERAL MAMMOGRAM WITH TOMO AND CAD COMPARISON:  Previous exam(s). ACR Breast Density Category b: There are scattered areas of fibroglandular density. FINDINGS: There are no findings suspicious for malignancy. Images were processed with CAD. IMPRESSION: No mammographic evidence of malignancy. A result letter of this screening mammogram will be mailed directly to the patient. RECOMMENDATION: Screening mammogram in one year. (Code:SM-B-01Y) BI-RADS CATEGORY  1: Negative. Electronically Signed   By: Claudie Revering M.D.   On: 05/04/2019 17:15   MM CLIP PLACEMENT LEFT  Result Date: 04/16/2016 CLINICAL DATA:  Confirmation clip placement after ultrasound-guided core needle biopsy of a palpable indeterminate 6 mm mass in the retroareolar left breast. EXAM: DIAGNOSTIC LEFT MAMMOGRAM POST ULTRASOUND BIOPSY COMPARISON:  Previous exam(s). FINDINGS: Mammographic images were obtained following ultrasound guided biopsy of a mass in the outer retroareolar left breast. The ribbon shaped tissue marker clip is appropriately positioned in the retroareolar left breast. Gas  is present within some of the subareolar ducts, as expected. Expected post  biopsy changes are present without evidence of hematoma. IMPRESSION: Appropriate positioning of the ribbon shaped tissue marker clip in the retroareolar left breast. Final Assessment: Post Procedure Mammograms for Marker Placement Electronically Signed   By: Evangeline Dakin M.D.   On: 04/16/2016 13:45        Pelvic/Bimanual Pap is not indicated today per BCCCP guidelines.   Smoking History: Patient has never smoked.   Patient Navigation: Patient education provided. Access to services provided for patient through [place] program. Spanish interpreter Rudene Anda from [facility] provided. Transportation provided to work following appointment.  Colorectal Cancer Screening: Per patient has had colonoscopy completed on 10/10/2015 at [facility]. FIT Test given to patient to complete.  No complaints today.    Breast and Cervical Cancer Risk Assessment: Patient does not have family history of breast cancer, known genetic mutations, or radiation treatment to the chest before age 63. Patient does not have history of cervical dysplasia, immunocompromised, or DES exposure in-utero.  Risk Assessment     Risk Scores       01/31/2021   Last edited by: Demetrius Revel, LPN   5-year risk: 1.1 %   Lifetime risk: 4.9 %            A: BCCCP exam without pap smear No complaints.  P: Referred patient to the [facility] for a screening mammogram on mobile unit. Appointment scheduled Tuesday, January 31, 2021 at 1140.  Loletta Parish, RN 01/31/2021 11:27 AM

## 2021-02-16 ENCOUNTER — Telehealth: Payer: Self-pay

## 2021-02-16 LAB — FECAL OCCULT BLOOD, IMMUNOCHEMICAL: Fecal Occult Bld: NEGATIVE

## 2021-02-16 NOTE — Telephone Encounter (Signed)
Via, Rudene Anda, [place] Interpreter, Patient informed negative FIT test results. Patient verbalized understanding.

## 2021-02-17 ENCOUNTER — Telehealth: Payer: Self-pay | Admitting: Internal Medicine

## 2021-02-17 NOTE — Telephone Encounter (Signed)
Copied from [place] [phone] [phone]. Topic: Referral - Question >> Feb 15, 2021  9:16 AM Robina Ade, Helene Kelp D wrote: Reason for CRM: Patient called to get an update on her referral to general surgery. Please call patient back, thanks.

## 2021-02-22 ENCOUNTER — Ambulatory Visit: Payer: Self-pay

## 2021-02-22 ENCOUNTER — Telehealth: Payer: Self-pay

## 2021-02-22 NOTE — Telephone Encounter (Signed)
Patient calling back checking on the status of referral and would like a follow up call 12-28-1955. Patient states abdominal pain has worsened will transfer to [facility] Nurse Triage. Patient would like a follow up call regarding the referral.  .

## 2021-02-22 NOTE — Telephone Encounter (Signed)
Message received from [place]:   Patient  calling back regarding a General Surgery referral. Patient states its been over a month and has not heard back from anyone.  Attempted to contact patient to inform her that referral has been placed and this CM will check with [facility] Referral Specialist on status of referral. Calls placed with assistance of Spanish Interpreter # [id number]/[employer]. # [phone], message left with call back requested to this CM. # [phone], no voicemail # [phone], no voicemail

## 2021-02-22 NOTE — Telephone Encounter (Signed)
Using Spanish interpreter Edd Arbour [phone], pt. Reports she is still having abdominal pain allover her stomach that comes and goes. No pain today. Reports she was supposed to hear from a surgeon and never has. No other symptoms at this time. Please advise pt.    Answer Assessment - Initial Assessment Questions 1. LOCATION: "Where does it hurt?"      All over, feels bloating 2. RADIATION: "Does the pain shoot anywhere else?" (e.g., chest, back)     All over belly 3. ONSET: "When did the pain begin?" (e.g., minutes, hours or days ago)      2 weeks ago 4. SUDDEN: "Gradual or sudden onset?"     Gradual 5. PATTERN "Does the pain come and go, or is it constant?"    - If constant: "Is it getting better, staying the same, or worsening?"      (Note: Constant means the pain never goes away completely; most serious pain is constant and it progresses)     - If intermittent: "How long does it last?" "Do you have pain now?"     (Note: Intermittent means the pain goes away completely between bouts)     Comes and goes 6. SEVERITY: "How bad is the pain?"  (e.g., Scale 1-10; mild, moderate, or severe)   - MILD (1-3): doesn't interfere with normal activities, abdomen soft and not tender to touch    - MODERATE (4-7): interferes with normal activities or awakens from sleep, abdomen tender to touch    - SEVERE (8-10): excruciating pain, doubled over, unable to do any normal activities      3 days ago - very strong. Today - none 7. RECURRENT SYMPTOM: "Have you ever had this type of stomach pain before?" If Yes, ask: "When was the last time?" and "What happened that time?"      No 8. CAUSE: "What do you think is causing the stomach pain?"     Gall bladder 9. RELIEVING/AGGRAVATING FACTORS: "What makes it better or worse?" (e.g., movement, antacids, bowel movement)     No 10. OTHER SYMPTOMS: "Do you have any other symptoms?" (e.g., back pain, diarrhea, fever, urination pain, vomiting)       No 11. PREGNANCY: "Is  there any chance you are pregnant?" "When was your last menstrual period?"       No  Protocols used: Abdominal Pain - [facility]

## 2021-03-04 NOTE — Patient Instructions (Addendum)
Allergic rhinitis with conjunctivitis (tree pollen and mold mix 2) -Continue Claritin (loratadine) once a day as needed for runny nose/itching. (Unable to get carbinoxamine due to cost per pharmacy. The pharmacy also does not carry Allegra. Has been on Zyrtec previously and was thought to be not effective.) -Continue Singulair 10 mg at night -Continue Atrovent 2 sprays each nostril twice a day (may use up to 3 times a day if needed for nasal drainage) -Continue olopatadine 0.2% eyedrops using 1 drop each eye once a day as needed for itchy watery eyes  Mild persistent asthma -Increase Flovent 110 mcg 2 puffs twice a day with spacer to help prevent cough and wheeze -Continue Singulair 10 mg as above to help prevent cough and wheeze -May use Ventolin (albuterol) 2 puffs every 4 hours as needed for cough, wheeze, tightness in chest, shortness of breath.  Also, may use albuterol 2 puffs 5 to 15 minutes prior to exercise.  Asthma control goals:  Full participation in all desired activities (may need albuterol before activity) Albuterol use two time or less a week on average (not counting use with activity) Cough interfering with sleep two time or less a month Oral steroids no more than once a year No hospitalizations  Anaphylaxis due to food -Continue avoid fish and shellfish. In case of an allergic reaction, give Benadryl 4 teaspoonfuls every 4 hours, and if life-threatening symptoms occur, inject with EpiPen 0.3 mg.  Please let us know if this treatment plan is not working well for you Schedule a follow-up appointment in 3 months or sooner if needed

## 2021-03-06 ENCOUNTER — Ambulatory Visit (INDEPENDENT_AMBULATORY_CARE_PROVIDER_SITE_OTHER): Payer: No Typology Code available for payment source | Admitting: Family

## 2021-03-06 ENCOUNTER — Other Ambulatory Visit: Payer: Self-pay

## 2021-03-06 ENCOUNTER — Other Ambulatory Visit: Payer: Self-pay | Admitting: Internal Medicine

## 2021-03-06 ENCOUNTER — Encounter: Payer: Self-pay | Admitting: Family

## 2021-03-06 VITALS — BP 130/72 | HR 78 | Temp 98.0°F | Resp 24 | Ht 60.0 in | Wt 160.6 lb

## 2021-03-06 DIAGNOSIS — T7800XD Anaphylactic reaction due to unspecified food, subsequent encounter: Secondary | ICD-10-CM

## 2021-03-06 DIAGNOSIS — J4530 Mild persistent asthma, uncomplicated: Secondary | ICD-10-CM

## 2021-03-06 DIAGNOSIS — J3089 Other allergic rhinitis: Secondary | ICD-10-CM

## 2021-03-06 DIAGNOSIS — H1013 Acute atopic conjunctivitis, bilateral: Secondary | ICD-10-CM

## 2021-03-06 DIAGNOSIS — I10 Essential (primary) hypertension: Secondary | ICD-10-CM

## 2021-03-06 MED ORDER — IPRATROPIUM BROMIDE 0.06 % NA SOLN
2.0000 | Freq: Two times a day (BID) | NASAL | 5 refills | Status: DC
  Filled 2021-03-06 – 2021-06-23 (×2): qty 15, 38d supply, fill #0

## 2021-03-06 MED ORDER — AMLODIPINE BESYLATE 5 MG PO TABS
ORAL_TABLET | Freq: Every day | ORAL | 0 refills | Status: DC
  Filled 2021-03-06: qty 30, 30d supply, fill #0

## 2021-03-06 MED ORDER — MONTELUKAST SODIUM 10 MG PO TABS
ORAL_TABLET | Freq: Every day | ORAL | 5 refills | Status: DC
  Filled 2021-03-06: qty 30, fill #0
  Filled 2021-05-09: qty 30, 30d supply, fill #0
  Filled 2021-07-24: qty 30, 30d supply, fill #1
  Filled 2021-09-27: qty 30, 30d supply, fill #2
  Filled 2021-10-30: qty 30, 30d supply, fill #3
  Filled 2022-03-05: qty 30, 30d supply, fill #4

## 2021-03-06 MED ORDER — FLUTICASONE PROPIONATE HFA 110 MCG/ACT IN AERO
INHALATION_SPRAY | RESPIRATORY_TRACT | 3 refills | Status: DC
  Filled 2021-03-06: qty 12, 30d supply, fill #0

## 2021-03-06 NOTE — Progress Notes (Signed)
[address] [place]  [address] Dept: [phone]  FOLLOW UP NOTE  Patient ID: Alexandra Brewer, female    DOB: [birth year]  Age: 65 y.o. MRN: [id number] Date of Office Visit: 03/06/2021  Assessment  Chief Complaint: Asthma (Act 20) and Allergic Rhinitis  (Follow up)  HPI Alexandra Brewer is a 65 year old female who presents today for follow-up of not well controlled mild persistent asthma, nonseasonal allergic rhinitis, allergic conjunctivitis, and allergy with anaphylaxis due to food.  She was last seen on December 19, 2020 by Althea Charon, FNP.  Since her last office visit she denies any new diagnoses or surgeries since we last saw her.  Allergic rhinitis with conjunctivitis is reported as controlled with Claritin 10 mg once a day, Singulair 10 mg at night, Atrovent 2 sprays each nostril once a day, and olopatadine eyedrops as needed.  She denies any rhinorrhea, nasal congestion, and postnasal drip.  She has not had any sinus infections since we last saw her.  Mild persistent asthma is reported as moderately controlled with Flovent 110 mcg 2 puffs once a day with spacer, Singulair 10 mg once a day, and Ventolin as needed.  She reports a little bit of shortness of breath in the morning when it is cold and denies coughing, wheezing, tightness in her chest, and nocturnal awakenings due to breathing problems.  Since her last office visit she has not required any systemic steroids or made any trips to the emergency room or urgent care due to breathing problems.  She has not been using her albuterol inhaler since we last saw her.  She reports that she did not know that she could use her albuterol inhaler while using Flovent.  Instructed her that she can use the Ventolin as needed 2 puffs every 4-6 hours as needed needed for cough, wheeze, tightness in chest, shortness of breath.  She continues to avoid fish and shellfish without any accidental ingestion or use of her EpiPen.  She  reports that her EpiPen is up-to-date.   Drug Allergies:  Allergies  Allergen Reactions   Fish Allergy     Throat swell up   Naproxen Rash   Shellfish Allergy     Throat swell up   Penicillins     Review of Systems: Review of Systems  Constitutional:  Positive for chills. Negative for fever.       Reports chills at times that she was told was due to her thyroid  HENT:         Denies rhinorrhea, nasal congestion, and post nasal drip  Eyes:        Reports occasional itchy watery eyes for which her eye drops help  Respiratory:  Positive for shortness of breath. Negative for cough and wheezing.        Reports a little bit of shortness of breath in the mornings with cold weather.   Cardiovascular:  Negative for chest pain and palpitations.  Gastrointestinal:        Reports reflux at times. Waiting to see a surgeon about her gallbladder.  Genitourinary:  Negative for frequency.  Skin:  Negative for itching and rash.  Neurological:  Positive for headaches.       Reports a little bit of headaches at times.  Endo/Heme/Allergies:  Positive for environmental allergies.    Physical Exam: BP 130/72   Pulse 78   Temp 98 F (36.7 C) (Temporal)   Resp (!) 24   Ht 5' (1.524 m)  Wt 160 lb 9.6 oz (72.8 kg)   SpO2 99%   BMI 31.37 kg/m    Physical Exam Constitutional:      Appearance: Normal appearance.  HENT:     Head: Normocephalic and atraumatic.     Comments: Normal, eyes normal, ears: Unable to see left tympanic membrane due to cerumen.  Right ear normal.  Nose: Bilateral lower turbinates mildly edematous with no drainage noted.    Right Ear: Tympanic membrane, ear canal and external ear normal.     Left Ear: Ear canal and external ear normal.     Mouth/Throat:     Mouth: Mucous membranes are moist.     Pharynx: Oropharynx is clear.  Eyes:     Conjunctiva/sclera: Conjunctivae normal.  Cardiovascular:     Rate and Rhythm: Regular rhythm.     Heart sounds: Normal heart  sounds.  Pulmonary:     Effort: Pulmonary effort is normal.     Breath sounds: Normal breath sounds.     Comments: Lungs clear to auscultation Musculoskeletal:     Cervical back: Neck supple.  Skin:    General: Skin is warm.  Neurological:     Mental Status: She is alert and oriented to person, place, and time.  Psychiatric:        Mood and Affect: Mood normal.        Behavior: Behavior normal.        Thought Content: Thought content normal.        Judgment: Judgment normal.    Diagnostics: FVC 1.63 L, FEV1 1.27 L.  Predicted FVC 2.67 L, predicted FEV1 2.07 L.  Spirometry indicates moderate restriction.  Spirometry  is consistent with previous spirometry.  Postbronchodilator response shows FVC 1.79 L, FEV1 1.48 L.  Spirometry indicates possible mild restriction with a 17% change in FEV1  Assessment and Plan: 1. Not well controlled mild persistent asthma   2. Non-seasonal allergic rhinitis due to other allergic trigger   3. Allergic conjunctivitis of both eyes   4. Allergy with anaphylaxis due to food, subsequent encounter     No orders of the defined types were placed in this encounter.   Patient Instructions  Allergic rhinitis with conjunctivitis (tree pollen and mold mix 2) -Continue Claritin (loratadine) once a day as needed for runny nose/itching. (Unable to get carbinoxamine due to cost per pharmacy. The pharmacy also does not carry Allegra. Has been on Zyrtec previously and was thought to be not effective.) -Continue Singulair 10 mg at night -Continue Atrovent 2 sprays each nostril twice a day (may use up to 3 times a day if needed for nasal drainage) -Continue olopatadine 0.2% eyedrops using 1 drop each eye once a day as needed for itchy watery eyes  Mild persistent asthma -Increase Flovent 110 mcg 2 puffs twice a day with spacer to help prevent cough and wheeze -Continue Singulair 10 mg as above to help prevent cough and wheeze -May use Ventolin (albuterol) 2 puffs  every 4 hours as needed for cough, wheeze, tightness in chest, shortness of breath.  Also, may use albuterol 2 puffs 5 to 15 minutes prior to exercise.  Asthma control goals:  Full participation in all desired activities (may need albuterol before activity) Albuterol use two time or less a week on average (not counting use with activity) Cough interfering with sleep two time or less a month Oral steroids no more than once a year No hospitalizations  Anaphylaxis due to food -Continue avoid fish and shellfish.  In case of an allergic reaction, give Benadryl 4 teaspoonfuls every 4 hours, and if life-threatening symptoms occur, inject with EpiPen 0.3 mg.  Please let us know if this treatment plan is not working well for you Schedule a follow-up appointment in 3 months or sooner if needed  Return in about 3 months (around 06/04/2021).    Thank you for the opportunity to care for this patient.  Please do not hesitate to contact me with questions.  Althea Charon, FNP [facility]

## 2021-03-07 ENCOUNTER — Other Ambulatory Visit: Payer: Self-pay

## 2021-03-13 ENCOUNTER — Other Ambulatory Visit: Payer: Self-pay

## 2021-03-13 ENCOUNTER — Encounter: Payer: Self-pay | Admitting: Internal Medicine

## 2021-03-13 ENCOUNTER — Ambulatory Visit: Payer: Self-pay | Attending: Internal Medicine | Admitting: Internal Medicine

## 2021-03-13 VITALS — BP 149/83 | HR 65 | Resp 16 | Wt 160.0 lb

## 2021-03-13 DIAGNOSIS — K8020 Calculus of gallbladder without cholecystitis without obstruction: Secondary | ICD-10-CM

## 2021-03-13 DIAGNOSIS — Z23 Encounter for immunization: Secondary | ICD-10-CM

## 2021-03-13 DIAGNOSIS — E039 Hypothyroidism, unspecified: Secondary | ICD-10-CM

## 2021-03-13 DIAGNOSIS — I10 Essential (primary) hypertension: Secondary | ICD-10-CM

## 2021-03-13 DIAGNOSIS — E6609 Other obesity due to excess calories: Secondary | ICD-10-CM

## 2021-03-13 DIAGNOSIS — Z6831 Body mass index (BMI) 31.0-31.9, adult: Secondary | ICD-10-CM

## 2021-03-13 MED ORDER — AMLODIPINE BESYLATE 5 MG PO TABS
5.0000 mg | ORAL_TABLET | Freq: Every day | ORAL | 1 refills | Status: DC
  Filled 2021-03-13: qty 90, 90d supply, fill #0
  Filled 2021-05-09: qty 30, 30d supply, fill #0
  Filled 2021-06-05: qty 30, 30d supply, fill #1
  Filled 2021-08-25: qty 30, 30d supply, fill #2

## 2021-03-13 MED ORDER — LEVOTHYROXINE SODIUM 112 MCG PO TABS
ORAL_TABLET | Freq: Every day | ORAL | 1 refills | Status: DC
  Filled 2021-03-13: qty 90, fill #0
  Filled 2021-04-11: qty 30, 30d supply, fill #0
  Filled 2021-05-09: qty 30, 30d supply, fill #1
  Filled 2021-06-23: qty 30, 30d supply, fill #2
  Filled 2021-07-24: qty 30, 30d supply, fill #3
  Filled 2021-08-25: qty 30, 30d supply, fill #4

## 2021-03-13 NOTE — Patient Instructions (Signed)

## 2021-03-13 NOTE — Progress Notes (Signed)
Patient ID: Alexandra Brewer, female    DOB: 10/02/1955  MRN: [id number]  CC: Hypertension   Subjective: Alexandra Brewer is a 65 y.o. female who presents for chronic ds management Her concerns today include:  HTN, thyroid ds, HL, GERD, Frozen shoulder RT, asthma mild persistent and food allergy (followed by allergy/asthma ctr)  Gall stones:  has appt with general surgeon 03/21/21 -needs transportation.  Does not drive.  HTN:  out of Norvasc, just refilled this a.m and has not taken it as yet. Limits salt in foods but "sometimes I go over a bit." No chest pains or shortness of breath.  No swelling in the legs. She is obese for height.  She feels she does well with her eating habits.  Reports that she does stretching exercises and walks daily for exercise.  Hypothyroid: taking levothyroxine 125 mcg consistently.  Asthma: Reports doing okay with asthma with no recent flare.  She is followed by the allergy/asthma center.   Patient Active Problem List   Diagnosis Date Noted   Calculus of gallbladder without cholecystitis without obstruction 01/27/2021   Fatty liver 01/27/2021   Class 1 obesity due to excess calories with serious comorbidity and body mass index (BMI) of 31.0 to 31.9 in adult 01/27/2021   Long term (current) use of non-steroidal anti-inflammatories (nsaid) 01/12/2021   Migraine without aura and without status migrainosus, not intractable 05/31/2020   Abdominal pain 03/10/2018   Sciatica of right side 06/11/2017   RUQ abdominal pain 01/23/2017   GERD (gastroesophageal reflux disease) 11/26/2016   Right Achilles tendinitis 08/15/2016   Hypertension 07/05/2016   Adhesive capsulitis of right shoulder 02/21/2016   Heel pain, bilateral 01/10/2016   Varicose veins of left lower extremity with pain 01/10/2016   History of colonic polyps    Diverticulosis of colon without hemorrhage    Hypothyroidism 08/10/2015   Chronic constipation 08/05/2015   Hyperlipidemia  08/05/2015     Current Outpatient Medications on File Prior to Visit  Medication Sig Dispense Refill   albuterol (VENTOLIN HFA) 108 (90 Base) MCG/ACT inhaler INHALE 2 PUFFS INTO THE LUNGS EVERY 6 (SIX) HOURS AS NEEDED FOR WHEEZING OR SHORTNESS OF BREATH. 18 g 0   diclofenac Sodium (VOLTAREN) 1 % GEL APPLY 4 GRAMS TOPICALLY 4 TIMES DAILY. FOR JOINT PAIN 100 g 0   EPINEPHrine 0.3 mg/0.3 mL IJ SOAJ injection INJECT 0.3 MG INTO THE MUSCLE AS NEEDED FOR ANAPHYLAXIS. AS NEEDED FOR LIFE-THREATENING ALLERGIC REACTIONS 2 each 1   fluticasone (FLOVENT HFA) 110 MCG/ACT inhaler USE 2 PUFFS TWICE A DAY WITH SPACER TO HELP PREVENT COUGH AND WHEEZE 12 g 3   ipratropium (ATROVENT) 0.06 % nasal spray Place 2 sprays into both nostrils in the morning and at bedtime. 15 mL 5   linaclotide (LINZESS) 145 MCG CAPS capsule TAKE 1 CAPSULE (145 MCG TOTAL) BY MOUTH DAILY BEFORE BREAKFAST. 30 capsule 11   loratadine (CLARITIN) 10 MG tablet Take 1 tablet (10 mg total) by mouth daily. 30 tablet 3   meloxicam (MOBIC) 15 MG tablet TAKE 1 TABLET (15 MG TOTAL) BY MOUTH DAILY. 30 tablet 3   montelukast (SINGULAIR) 10 MG tablet TAKE 1 TABLET (10 MG TOTAL) BY MOUTH AT BEDTIME. 30 tablet 5   Olopatadine HCl 0.2 % SOLN Place 1 drop in each eye once a day as needed for itchy watery eyes 2.5 mL 5   pantoprazole (PROTONIX) 40 MG tablet TAKE 1 TABLET (40 MG TOTAL) BY MOUTH DAILY. 30 tablet 11  topiramate (TOPAMAX) 25 MG tablet TAKE 1 TABLET (25 MG TOTAL) BY MOUTH AT BEDTIME. (Patient not taking: Reported on 03/06/2021) 30 tablet 4   Wheat Dextrin (BENEFIBER) POWD Take by mouth 2 (two) times daily. Takes 2 tbsp twice daily     [DISCONTINUED] fluticasone (FLONASE) 50 MCG/ACT nasal spray Place 2 sprays into both nostrils daily as needed for allergies or rhinitis. 16 g 6   No current facility-administered medications on file prior to visit.    Allergies  Allergen Reactions   Fish Allergy     Throat swell up   Naproxen Rash   Shellfish  Allergy     Throat swell up   Penicillins     Social History   Socioeconomic History   Marital status: Single    Spouse name: Not on file   Number of children: 1   Years of education: Not on file   Highest education level: 3rd grade  Occupational History   Not on file  Tobacco Use   Smoking status: Never   Smokeless tobacco: Never  Vaping Use   Vaping Use: Never used  Substance and Sexual Activity   Alcohol use: No    Alcohol/week: 0.0 standard drinks   Drug use: No   Sexual activity: Yes    Birth control/protection: Post-menopausal  Other Topics Concern   Not on file  Social History Narrative   Not on file   Social Determinants of Health   Financial Resource Strain: Not on file  Food Insecurity: Food Insecurity Present   Worried About Running Out of Food in the Last Year: Sometimes true   Ran Out of Food in the Last Year: Sometimes true  Transportation Needs: Unmet Transportation Needs   Lack of Transportation (Medical): Yes   Lack of Transportation (Non-Medical): Yes  Physical Activity: Not on file  Stress: Not on file  Social Connections: Not on file  Intimate Partner Violence: Not on file    Family History  Problem Relation Age of Onset   Allergic rhinitis Mother    Diabetes Father    Hypertension Father    Asthma Father    Stroke Maternal Grandmother    Stroke Maternal Grandfather    Colon cancer Neg Hx    Angioedema Neg Hx    Atopy Neg Hx    Immunodeficiency Neg Hx    Urticaria Neg Hx    Breast cancer Neg Hx     Past Surgical History:  Procedure Laterality Date   BREAST BIOPSY Left 2018   FIBROCYSTIC CHANGES WITH FEATURES OF RUPTURE   CATARACT EXTRACTION     COLONOSCOPY N/A 10/10/2015   Procedure: COLONOSCOPY;  Surgeon: Daneil Dolin, MD;  Location: [facility];  Service: Endoscopy;  Laterality: N/A;  245 - Interpreter scheduled, do NOT move    ROS: Review of Systems Negative except as stated above  PHYSICAL EXAM: BP (!) 149/83    Pulse 65   Resp 16   Wt 160 lb (72.6 kg)   SpO2 98%   BMI 31.25 kg/m   Physical Exam  General appearance - alert, well appearing, and in no distress Mental status - normal mood, behavior, speech, dress, motor activity, and thought processes Neck - supple, no significant adenopathy Chest - clear to auscultation, no wheezes, rales or rhonchi, symmetric air entry Heart - normal rate, regular rhythm, normal S1, S2, no murmurs, rubs, clicks or gallops Extremities - peripheral pulses normal, no pedal edema, no clubbing or cyanosis   CMP Latest Ref  Rng & Units 01/12/2021 06/06/2020 01/22/2019  Glucose 70 - 99 mg/dL 89 94 84  BUN 8 - 27 mg/dL 9 13 10   Creatinine 0.57 - 1.00 mg/dL 0.65 0.85 0.67  Sodium 134 - 144 mmol/L 141 139 141  Potassium 3.5 - 5.2 mmol/L 4.9 4.9 4.7  Chloride 96 - 106 mmol/L 103 99 101  CO2 20 - 29 mmol/L - 27 26  Calcium 8.7 - 10.3 mg/dL 9.7 9.2 9.8  Total Protein 6.0 - 8.5 g/dL 7.5 6.9 7.4  Total Bilirubin 0.0 - 1.2 mg/dL 0.3 <0.2 <0.2  Alkaline Phos 44 - 121 IU/L 129(H) 114 124(H)  AST 0 - 40 IU/L 17 16 14   ALT 0 - 32 IU/L - 15 12   Lipid Panel     Component Value Date/Time   CHOL 203 (H) 06/06/2020 1342   TRIG 356 (H) 06/06/2020 1342   HDL 46 06/06/2020 1342   CHOLHDL 4.4 06/06/2020 1342   CHOLHDL 3.8 07/14/2015 0908   VLDL 18 07/14/2015 0908   LDLCALC 97 06/06/2020 1342    CBC    Component Value Date/Time   WBC 6.6 01/12/2021 0933   WBC 7.1 07/14/2015 0908   RBC 5.07 01/12/2021 0933   RBC 4.88 07/14/2015 0908   HGB 14.1 01/12/2021 0933   HCT 44.1 01/12/2021 0933   PLT 388 01/12/2021 0933   MCV 87 01/12/2021 0933   MCH 27.8 01/12/2021 0933   MCH 28.9 07/14/2015 0908   MCHC 32.0 01/12/2021 0933   MCHC 33.2 07/14/2015 0908   RDW 13.1 01/12/2021 0933   LYMPHSABS 1.6 01/12/2021 0933   MONOABS 497 07/14/2015 0908   EOSABS 0.1 01/12/2021 0933   BASOSABS 0.1 01/12/2021 0933    ASSESSMENT AND PLAN:  1. Essential hypertension Not at goal.  She  was out of amlodipine.  Refill given.  She will continue taking medicine every day as prescribed.  Encouraged her to limit salt in the foods. - amLODipine (NORVASC) 5 MG tablet; Take 1 tablet (5 mg total) by mouth daily.  Dispense: 90 tablet; Refill: 1  2. Acquired hypothyroidism Continue levothyroxine.  Due for thyroid level recheck. - TSH - levothyroxine (SYNTHROID) 112 MCG tablet; TAKE 1 TABLET (112 MCG TOTAL) BY MOUTH DAILY.  Dispense: 90 tablet; Refill: 1  3. Calculus of gallbladder without cholecystitis without obstruction Message sent to our caseworker to see if she can assist with medical transportation to and from the appointment with the surgeon that is scheduled for next month  4. Class 1 obesity due to excess calories with serious comorbidity and body mass index (BMI) of 31.0 to 31.9 in adult Encourage healthy eating habits.  Encouraged her to continue regular exercise  5. Need for immunization against influenza - Flu Vaccine QUAD 58mo+IM (Fluarix, Fluzone & Alfiuria Quad PF)  6. Need for vaccination against Streptococcus pneumoniae PCV 20 given today.     Patient was given the opportunity to ask questions.  Patient verbalized understanding of the plan and was able to repeat key elements of the plan.  [employer] Language interpreter used during this encounter. #[id number], Johnsie Cancel  Orders Placed This Encounter  Procedures   Flu Vaccine QUAD 68mo+IM (Fluarix, Fluzone & Alfiuria Quad PF)   TSH     Requested Prescriptions   Signed Prescriptions Disp Refills   amLODipine (NORVASC) 5 MG tablet 90 tablet 1    Sig: Take 1 tablet (5 mg total) by mouth daily.   levothyroxine (SYNTHROID) 112 MCG tablet 90 tablet 1  Sig: TAKE 1 TABLET (112 MCG TOTAL) BY MOUTH DAILY.    Return in about 4 months (around 07/12/2021).  Karle Plumber, MD, FACP

## 2021-03-14 ENCOUNTER — Telehealth: Payer: Self-pay

## 2021-03-14 LAB — TSH: TSH: 2.3 u[IU]/mL (ref 0.450–4.500)

## 2021-03-14 NOTE — Telephone Encounter (Signed)
Contacted pt to go over lab results pt didn't answer lvm asking pt to give me a call at her/his earliest [place] interperter: Olga Coaster KC#[id number] Sent a CRM and forward labs to NT to give pt labs when they call back

## 2021-03-16 NOTE — Telephone Encounter (Signed)
Attempted to contact patient to inform her that this CM will arrange [employer] [employer] to her appointment at [facility] , [address], [place], [place], [place] [address] on 04/13/2021 @ 1000.  Call placed to patient # [phone]  with assistance of Spanish Interpreter # [id number]/[employer]. Message left with call back requested to this CM.

## 2021-03-20 ENCOUNTER — Other Ambulatory Visit: Payer: Self-pay

## 2021-03-20 ENCOUNTER — Ambulatory Visit: Payer: No Typology Code available for payment source | Attending: Internal Medicine

## 2021-03-20 NOTE — Telephone Encounter (Signed)
Met with the patient when she was in the clinic today. Wynonia Hazard, [facility] Financial Counselor was the [place] interpreter.  Explained to her that this CM can arrange [employer] [employer] to her appointment with the surgeon in [facility] 04/13/2021. She was very Patent attorney. Confirmed her address and phone number and informed her that Clifton James would call her with any additional information and she can call Clifton James if she has any questions.

## 2021-03-21 ENCOUNTER — Telehealth: Payer: Self-pay

## 2021-03-21 ENCOUNTER — Ambulatory Visit: Payer: No Typology Code available for payment source | Admitting: General Surgery

## 2021-03-21 NOTE — Telephone Encounter (Signed)
Ride to the appointment on 04/13/2021 has been scheduled through [facility].  ID # [id number]

## 2021-03-21 NOTE — Telephone Encounter (Signed)
Patient's ride to [facility] on 04/13/2021 has been scheduled through [facility].  ID # [id number].  It is noted in the referral that she is Spanish speaking.

## 2021-04-11 ENCOUNTER — Other Ambulatory Visit: Payer: Self-pay

## 2021-04-13 ENCOUNTER — Other Ambulatory Visit: Payer: Self-pay

## 2021-04-13 ENCOUNTER — Encounter: Payer: Self-pay | Admitting: General Surgery

## 2021-04-13 ENCOUNTER — Ambulatory Visit (INDEPENDENT_AMBULATORY_CARE_PROVIDER_SITE_OTHER): Payer: Self-pay | Admitting: General Surgery

## 2021-04-13 VITALS — BP 149/73 | HR 71 | Temp 97.6°F | Resp 14 | Ht 60.0 in | Wt 159.0 lb

## 2021-04-13 DIAGNOSIS — K8020 Calculus of gallbladder without cholecystitis without obstruction: Secondary | ICD-10-CM

## 2021-04-13 NOTE — Progress Notes (Signed)
Alexandra Brewer; [id number]; Nov 24, 1955   HPI Patient is a 66 year old Hispanic female who was referred to my care by Dr. Wynetta Emery for evaluation and treatment of biliary colic secondary to cholelithiasis.  She states she had a bad attack of right upper quadrant abdominal pain and nausea in October 2022.  She has since had an ultrasound which revealed cholelithiasis.  Her liver enzyme tests were within normal limits.  She states her symptoms have for the most part decreased in intensity and frequency due to changing her diet.  She denies any fever, chills, or jaundice.  History was obtained with an interpreter present. Past Medical History:  Diagnosis Date   Asthma    Constipation    Hyperlipemia    Sciatica    Thyroid disease    Urticaria     Past Surgical History:  Procedure Laterality Date   BREAST BIOPSY Left 2018   FIBROCYSTIC CHANGES WITH FEATURES OF RUPTURE   CATARACT EXTRACTION     COLONOSCOPY N/A 10/10/2015   Procedure: COLONOSCOPY;  Surgeon: Daneil Dolin, MD;  Location: [facility];  Service: Endoscopy;  Laterality: N/A;  245 - Interpreter scheduled, do NOT move    Family History  Problem Relation Age of Onset   Allergic rhinitis Mother    Diabetes Father    Hypertension Father    Asthma Father    Stroke Maternal Grandmother    Stroke Maternal Grandfather    Colon cancer Neg Hx    Angioedema Neg Hx    Atopy Neg Hx    Immunodeficiency Neg Hx    Urticaria Neg Hx    Breast cancer Neg Hx     Current Outpatient Medications on File Prior to Visit  Medication Sig Dispense Refill   albuterol (VENTOLIN HFA) 108 (90 Base) MCG/ACT inhaler INHALE 2 PUFFS INTO THE LUNGS EVERY 6 (SIX) HOURS AS NEEDED FOR WHEEZING OR SHORTNESS OF BREATH. 18 g 0   amLODipine (NORVASC) 5 MG tablet Take 1 tablet (5 mg total) by mouth daily. 90 tablet 1   diclofenac Sodium (VOLTAREN) 1 % GEL APPLY 4 GRAMS TOPICALLY 4 TIMES DAILY. FOR JOINT PAIN 100 g 0   EPINEPHrine 0.3 mg/0.3 mL IJ SOAJ  injection INJECT 0.3 MG INTO THE MUSCLE AS NEEDED FOR ANAPHYLAXIS. AS NEEDED FOR LIFE-THREATENING ALLERGIC REACTIONS 2 each 1   fluticasone (FLOVENT HFA) 110 MCG/ACT inhaler USE 2 PUFFS TWICE A DAY WITH SPACER TO HELP PREVENT COUGH AND WHEEZE 12 g 3   ipratropium (ATROVENT) 0.06 % nasal spray Place 2 sprays into both nostrils in the morning and at bedtime. 15 mL 5   levothyroxine (SYNTHROID) 112 MCG tablet TAKE 1 TABLET (112 MCG TOTAL) BY MOUTH DAILY. 90 tablet 1   linaclotide (LINZESS) 145 MCG CAPS capsule TAKE 1 CAPSULE (145 MCG TOTAL) BY MOUTH DAILY BEFORE BREAKFAST. 30 capsule 11   loratadine (CLARITIN) 10 MG tablet Take 1 tablet (10 mg total) by mouth daily. 30 tablet 3   meloxicam (MOBIC) 15 MG tablet TAKE 1 TABLET (15 MG TOTAL) BY MOUTH DAILY. 30 tablet 3   montelukast (SINGULAIR) 10 MG tablet TAKE 1 TABLET (10 MG TOTAL) BY MOUTH AT BEDTIME. 30 tablet 5   Olopatadine HCl 0.2 % SOLN Place 1 drop in each eye once a day as needed for itchy watery eyes 2.5 mL 5   pantoprazole (PROTONIX) 40 MG tablet TAKE 1 TABLET (40 MG TOTAL) BY MOUTH DAILY. 30 tablet 11   topiramate (TOPAMAX) 25 MG tablet TAKE 1  TABLET (25 MG TOTAL) BY MOUTH AT BEDTIME. (Patient not taking: Reported on 03/06/2021) 30 tablet 4   Wheat Dextrin (BENEFIBER) POWD Take by mouth 2 (two) times daily. Takes 2 tbsp twice daily     [DISCONTINUED] fluticasone (FLONASE) 50 MCG/ACT nasal spray Place 2 sprays into both nostrils daily as needed for allergies or rhinitis. 16 g 6   No current facility-administered medications on file prior to visit.    Allergies  Allergen Reactions   Fish Allergy     Throat swell up   Naproxen Rash   Shellfish Allergy     Throat swell up   Penicillins     Social History   Substance and Sexual Activity  Alcohol Use No   Alcohol/week: 0.0 standard drinks    Social History   Tobacco Use  Smoking Status Never  Smokeless Tobacco Never    Review of Systems  Constitutional: Negative.   HENT:   Positive for sinus pain.   Eyes: Negative.   Respiratory: Negative.    Cardiovascular: Negative.   Gastrointestinal:  Positive for heartburn.  Genitourinary: Negative.   Musculoskeletal:  Positive for back pain.  Skin: Negative.   Neurological: Negative.   Endo/Heme/Allergies: Negative.   Psychiatric/Behavioral: Negative.     Objective   Vitals:   04/13/21 1005  BP: (!) 149/73  Pulse: 71  Resp: 14  Temp: 97.6 F (36.4 C)  SpO2: 98%    Physical Exam Vitals reviewed.  Constitutional:      Appearance: Normal appearance. She is not ill-appearing.  HENT:     Head: Normocephalic and atraumatic.  Eyes:     General: No scleral icterus. Cardiovascular:     Rate and Rhythm: Normal rate and regular rhythm.     Heart sounds: Normal heart sounds. No murmur heard.   No friction rub. No gallop.  Pulmonary:     Effort: Pulmonary effort is normal. No respiratory distress.     Breath sounds: Normal breath sounds. No stridor. No wheezing, rhonchi or rales.  Abdominal:     General: Bowel sounds are normal. There is no distension.     Palpations: Abdomen is soft. There is no mass.     Tenderness: There is no abdominal tenderness. There is no guarding or rebound.     Hernia: No hernia is present.  Skin:    General: Skin is warm and dry.  Neurological:     Mental Status: She is alert and oriented to person, place, and time.   Primary care notes, blood work, and x-ray reports reviewed Assessment  Biliary colic secondary to cholelithiasis Plan  Patient is scheduled for laparoscopic cholecystectomy on 05/26/2021.  The risks and benefits of the procedure including bleeding, infection, hepatobiliary injury, and the possibility of an open procedure were fully explained to the patient, who gave informed consent.

## 2021-04-14 ENCOUNTER — Other Ambulatory Visit: Payer: Self-pay

## 2021-04-27 NOTE — H&P (Signed)
Alexandra Brewer; [id number]; 06-24-1955   HPI Patient is a 66 year old Hispanic female who was referred to my care by Dr. Wynetta Emery for evaluation and treatment of biliary colic secondary to cholelithiasis.  She states she had a bad attack of right upper quadrant abdominal pain and nausea in October 2022.  She has since had an ultrasound which revealed cholelithiasis.  Her liver enzyme tests were within normal limits.  She states her symptoms have for the most part decreased in intensity and frequency due to changing her diet.  She denies any fever, chills, or jaundice.  History was obtained with an interpreter present. Past Medical History:  Diagnosis Date   Asthma    Constipation    Hyperlipemia    Sciatica    Thyroid disease    Urticaria     Past Surgical History:  Procedure Laterality Date   BREAST BIOPSY Left 2018   FIBROCYSTIC CHANGES WITH FEATURES OF RUPTURE   CATARACT EXTRACTION     COLONOSCOPY N/A 10/10/2015   Procedure: COLONOSCOPY;  Surgeon: Daneil Dolin, MD;  Location: [facility];  Service: Endoscopy;  Laterality: N/A;  245 - Interpreter scheduled, do NOT move    Family History  Problem Relation Age of Onset   Allergic rhinitis Mother    Diabetes Father    Hypertension Father    Asthma Father    Stroke Maternal Grandmother    Stroke Maternal Grandfather    Colon cancer Neg Hx    Angioedema Neg Hx    Atopy Neg Hx    Immunodeficiency Neg Hx    Urticaria Neg Hx    Breast cancer Neg Hx     Current Outpatient Medications on File Prior to Visit  Medication Sig Dispense Refill   albuterol (VENTOLIN HFA) 108 (90 Base) MCG/ACT inhaler INHALE 2 PUFFS INTO THE LUNGS EVERY 6 (SIX) HOURS AS NEEDED FOR WHEEZING OR SHORTNESS OF BREATH. 18 g 0   amLODipine (NORVASC) 5 MG tablet Take 1 tablet (5 mg total) by mouth daily. 90 tablet 1   diclofenac Sodium (VOLTAREN) 1 % GEL APPLY 4 GRAMS TOPICALLY 4 TIMES DAILY. FOR JOINT PAIN 100 g 0   EPINEPHrine 0.3 mg/0.3 mL IJ SOAJ  injection INJECT 0.3 MG INTO THE MUSCLE AS NEEDED FOR ANAPHYLAXIS. AS NEEDED FOR LIFE-THREATENING ALLERGIC REACTIONS 2 each 1   fluticasone (FLOVENT HFA) 110 MCG/ACT inhaler USE 2 PUFFS TWICE A DAY WITH SPACER TO HELP PREVENT COUGH AND WHEEZE 12 g 3   ipratropium (ATROVENT) 0.06 % nasal spray Place 2 sprays into both nostrils in the morning and at bedtime. 15 mL 5   levothyroxine (SYNTHROID) 112 MCG tablet TAKE 1 TABLET (112 MCG TOTAL) BY MOUTH DAILY. 90 tablet 1   linaclotide (LINZESS) 145 MCG CAPS capsule TAKE 1 CAPSULE (145 MCG TOTAL) BY MOUTH DAILY BEFORE BREAKFAST. 30 capsule 11   loratadine (CLARITIN) 10 MG tablet Take 1 tablet (10 mg total) by mouth daily. 30 tablet 3   meloxicam (MOBIC) 15 MG tablet TAKE 1 TABLET (15 MG TOTAL) BY MOUTH DAILY. 30 tablet 3   montelukast (SINGULAIR) 10 MG tablet TAKE 1 TABLET (10 MG TOTAL) BY MOUTH AT BEDTIME. 30 tablet 5   Olopatadine HCl 0.2 % SOLN Place 1 drop in each eye once a day as needed for itchy watery eyes 2.5 mL 5   pantoprazole (PROTONIX) 40 MG tablet TAKE 1 TABLET (40 MG TOTAL) BY MOUTH DAILY. 30 tablet 11   topiramate (TOPAMAX) 25 MG tablet TAKE 1  TABLET (25 MG TOTAL) BY MOUTH AT BEDTIME. (Patient not taking: Reported on 03/06/2021) 30 tablet 4   Wheat Dextrin (BENEFIBER) POWD Take by mouth 2 (two) times daily. Takes 2 tbsp twice daily     [DISCONTINUED] fluticasone (FLONASE) 50 MCG/ACT nasal spray Place 2 sprays into both nostrils daily as needed for allergies or rhinitis. 16 g 6   No current facility-administered medications on file prior to visit.    Allergies  Allergen Reactions   Fish Allergy     Throat swell up   Naproxen Rash   Shellfish Allergy     Throat swell up   Penicillins     Social History   Substance and Sexual Activity  Alcohol Use No   Alcohol/week: 0.0 standard drinks    Social History   Tobacco Use  Smoking Status Never  Smokeless Tobacco Never    Review of Systems  Constitutional: Negative.   HENT:   Positive for sinus pain.   Eyes: Negative.   Respiratory: Negative.    Cardiovascular: Negative.   Gastrointestinal:  Positive for heartburn.  Genitourinary: Negative.   Musculoskeletal:  Positive for back pain.  Skin: Negative.   Neurological: Negative.   Endo/Heme/Allergies: Negative.   Psychiatric/Behavioral: Negative.     Objective   Vitals:   04/13/21 1005  BP: (!) 149/73  Pulse: 71  Resp: 14  Temp: 97.6 F (36.4 C)  SpO2: 98%    Physical Exam Vitals reviewed.  Constitutional:      Appearance: Normal appearance. She is not ill-appearing.  HENT:     Head: Normocephalic and atraumatic.  Eyes:     General: No scleral icterus. Cardiovascular:     Rate and Rhythm: Normal rate and regular rhythm.     Heart sounds: Normal heart sounds. No murmur heard.   No friction rub. No gallop.  Pulmonary:     Effort: Pulmonary effort is normal. No respiratory distress.     Breath sounds: Normal breath sounds. No stridor. No wheezing, rhonchi or rales.  Abdominal:     General: Bowel sounds are normal. There is no distension.     Palpations: Abdomen is soft. There is no mass.     Tenderness: There is no abdominal tenderness. There is no guarding or rebound.     Hernia: No hernia is present.  Skin:    General: Skin is warm and dry.  Neurological:     Mental Status: She is alert and oriented to person, place, and time.   Primary care notes, blood work, and x-ray reports reviewed Assessment  Biliary colic secondary to cholelithiasis Plan  Patient is scheduled for laparoscopic cholecystectomy on 05/26/2021.  The risks and benefits of the procedure including bleeding, infection, hepatobiliary injury, and the possibility of an open procedure were fully explained to the patient, who gave informed consent.

## 2021-05-09 ENCOUNTER — Other Ambulatory Visit: Payer: Self-pay

## 2021-05-09 ENCOUNTER — Other Ambulatory Visit: Payer: Self-pay | Admitting: Family

## 2021-05-12 ENCOUNTER — Other Ambulatory Visit: Payer: Self-pay

## 2021-05-12 MED ORDER — FLUTICASONE PROPIONATE HFA 110 MCG/ACT IN AERO
INHALATION_SPRAY | RESPIRATORY_TRACT | 3 refills | Status: DC
  Filled 2021-05-12: qty 12, 26d supply, fill #0
  Filled 2021-11-24: qty 12, 26d supply, fill #1

## 2021-05-12 NOTE — Telephone Encounter (Signed)
Patient called requesting refill for fluticasone to be sent to [facility] at [address] [place],  [place]  [address]  Best contact number: [phone]

## 2021-05-15 NOTE — Progress Notes (Signed)
Primary Care Physician: Ladell Pier, MD  Primary Gastroenterologist:  Garfield Cornea, MD   Chief Complaint  Patient presents with   Constipation    Lots of straining sometimes, has BM once a day with taking medication   Gastroesophageal Reflux    Every day acid coming up     HPI: Alexandra Brewer is a 66 y.o. female here for follow up GERD. Last seen in 02/2020. Also with chronic constipation.  Presents with formal interpretor today.  Patient presents for further evaluation of GERD and constipation.  Her weight is down about 6 pounds since September. A lot of belching after meals. Feels like food not digesting well. Some regurgitation as well. Does ok with fruit and salad. If eat bread, heavy foods "stomach does not want to accept that food anymore". Trying to stick with only mild food. Sometimes wake up and feels like stomach is very "inflamed". Feels burning in stomach in the epigastric region. Wakes up feeling like this even before eating. Not every day. Tries to eat dinner as early as she can. Not eating late. Taking meloxicam 15mg  daily. Does not take Advil or Aleve. Regurgitation happens 1-2 times per week. Occurring while awake. Taking pantoprazole after first meal of day. Taking Linzess 196mcg once every 2 weeks. If takes daily, then she has diarrhea. Taking Benefiber 2 tablespoons per day. BM once per day, some days she has to strain, small amount coming out and then takes Linzess. No melena, brbpr. Some rare dysphagia to greasy foods.   She is scheduled for cholecystectomy on May 26, 2021.  H. pylori serologies October 2022 were negative.  No prior EGD.  Abdominal ultrasound October 2022 for right-sided abdominal pain showed stones and sludge in the gallbladder but no gallbladder wall thickening or pericholecystic fluid.  Hepatic steatosis suspected.  LFTs at that time with mildly elevated alkaline phosphatase of 129.  Colonoscopy 10/2015: - One 3 mm polyp in the  cecum, removed with a cold snare. Resected and retrieved. - Diverticulosis in the entire examined colon. - Internal hemorrhoids. -benign colonic mucosa -next colonoscopy in 09/2025  Current Outpatient Medications  Medication Sig Dispense Refill   albuterol (VENTOLIN HFA) 108 (90 Base) MCG/ACT inhaler INHALE 2 PUFFS INTO THE LUNGS EVERY 6 (SIX) HOURS AS NEEDED FOR WHEEZING OR SHORTNESS OF BREATH. 18 g 0   amLODipine (NORVASC) 5 MG tablet Take 1 tablet (5 mg total) by mouth daily. 90 tablet 1   diclofenac Sodium (VOLTAREN) 1 % GEL APPLY 4 GRAMS TOPICALLY 4 TIMES DAILY. FOR JOINT PAIN 100 g 0   fluticasone (FLOVENT HFA) 110 MCG/ACT inhaler USE 2 PUFFS TWICE A DAY WITH SPACER TO HELP PREVENT COUGH AND WHEEZE 12 g 3   fluticasone (FLOVENT HFA) 110 MCG/ACT inhaler USE 2 PUFFS TWICE A DAY WITH SPACER TO HELP PREVENT COUGH AND WHEEZE 12 g 3   ipratropium (ATROVENT) 0.06 % nasal spray Place 2 sprays into both nostrils in the morning and at bedtime. 15 mL 5   levothyroxine (SYNTHROID) 112 MCG tablet TAKE 1 TABLET (112 MCG TOTAL) BY MOUTH DAILY. 90 tablet 1   linaclotide (LINZESS) 145 MCG CAPS capsule TAKE 1 CAPSULE (145 MCG TOTAL) BY MOUTH DAILY BEFORE BREAKFAST. 30 capsule 11   loratadine (CLARITIN) 10 MG tablet Take 1 tablet (10 mg total) by mouth daily. 30 tablet 3   meloxicam (MOBIC) 15 MG tablet TAKE 1 TABLET (15 MG TOTAL) BY MOUTH DAILY. 30 tablet 3   montelukast (  SINGULAIR) 10 MG tablet TAKE 1 TABLET (10 MG TOTAL) BY MOUTH AT BEDTIME. 30 tablet 5   Olopatadine HCl 0.2 % SOLN Place 1 drop in each eye once a day as needed for itchy watery eyes 2.5 mL 5   pantoprazole (PROTONIX) 40 MG tablet TAKE 1 TABLET (40 MG TOTAL) BY MOUTH DAILY. 30 tablet 11   Wheat Dextrin (BENEFIBER) POWD Take by mouth 2 (two) times daily. Takes 2 tbsp twice daily     No current facility-administered medications for this visit.    Allergies as of 05/16/2021 - Review Complete 05/16/2021  Allergen Reaction Noted   Fish  allergy  06/11/2017   Naproxen Rash 06/29/2019   Shellfish allergy  06/11/2017   Penicillins  07/20/2015    ROS:  General: Negative for anorexia,  fever, chills, fatigue, weakness. Weight down with dietary changes ENT: Negative for hoarseness, difficulty swallowing , nasal congestion. CV: Negative for chest pain, angina, palpitations, dyspnea on exertion, peripheral edema.  Respiratory: Negative for dyspnea at rest, dyspnea on exertion, cough, sputum, wheezing.  GI: See history of present illness. GU:  Negative for dysuria, hematuria, urinary incontinence, urinary frequency, nocturnal urination.  Endo: Negative for unusual weight change.    Physical Examination:   BP (!) 150/76    Pulse 72    Temp (!) 97.3 F (36.3 C)    Ht 5' (1.524 m)    Wt 158 lb (71.7 kg)    BMI 30.86 kg/m   General: Well-nourished, well-developed in no acute distress.  Eyes: No icterus. Mouth: masked Abdomen: Bowel sounds are normal,  nondistended, no hepatosplenomegaly or masses, no abdominal bruits or hernia , no rebound or guarding.  Mild epigastric/ruq tenderness Extremities: No lower extremity edema. No clubbing or deformities. Neuro: Alert and oriented x 4   Skin: Warm and dry, no jaundice.   Psych: Alert and cooperative, normal mood and affect.  Labs:  Lab Results  Component Value Date   CREATININE 0.65 01/12/2021   BUN 9 01/12/2021   NA 141 01/12/2021   K 4.9 01/12/2021   CL 103 01/12/2021   CO2 27 06/06/2020   Lab Results  Component Value Date   ALT 15 06/06/2020   AST 17 01/12/2021   ALKPHOS 129 (H) 01/12/2021   BILITOT 0.3 01/12/2021   Lab Results  Component Value Date   WBC 6.6 01/12/2021   HGB 14.1 01/12/2021   HCT 44.1 01/12/2021   MCV 87 01/12/2021   PLT 388 01/12/2021    No results found for: LIPASE   Imaging Studies: No results found.   Assessment:  GERD/dysphagia/epigastric burning: Taking pantoprazole 40 mg daily with her first meal.  Complains of occasional  regurgitation.  She has frequent epigastric burning.  This symptom tends to be worse first thing in the morning before she eats.  Complains of postprandial belching, feels like her food does not digest especially greasy, heavy foods.  Sometimes feels like food is in her chest.  Discussed with patient at length, some of her symptoms could be refractory GERD but difficult to sort out how much of her symptoms are secondary to gallbladder disease.  We will have to reassess after her cholecystectomy to see how much of her symptoms are still present.  She is on Mobic daily therefore at increased risk of gastritis and peptic ulcer disease.  Chronic constipation: Taking Benefiber daily.  Having bowel movement daily but often has to strain or incomplete stool and then will take Linzess 145 mcg once every  1 to 2 weeks after noting that significant straining with BM.  Did not tolerate daily Linzess 145 mcg due to diarrhea.  Will decrease dose and see if we can get her taking it a little bit more regularly at least 2-3 times a week in order to avoid straining.    Plan: Linzess 69mcg daily, try to take at least 2-3 times per week to avoid straining.  Pantoprazole 40mg  BID with breakfast and supper.  Reinforced antireflux measures.  Handouts provided in [place]. Follow up in 4 months or call sooner if still having problems.

## 2021-05-16 ENCOUNTER — Ambulatory Visit (INDEPENDENT_AMBULATORY_CARE_PROVIDER_SITE_OTHER): Payer: Self-pay | Admitting: Gastroenterology

## 2021-05-16 ENCOUNTER — Encounter: Payer: Self-pay | Admitting: Gastroenterology

## 2021-05-16 ENCOUNTER — Other Ambulatory Visit: Payer: Self-pay

## 2021-05-16 VITALS — BP 150/76 | HR 72 | Temp 97.3°F | Ht 60.0 in | Wt 158.0 lb

## 2021-05-16 DIAGNOSIS — K219 Gastro-esophageal reflux disease without esophagitis: Secondary | ICD-10-CM

## 2021-05-16 DIAGNOSIS — R1319 Other dysphagia: Secondary | ICD-10-CM | POA: Insufficient documentation

## 2021-05-16 DIAGNOSIS — R1013 Epigastric pain: Secondary | ICD-10-CM | POA: Insufficient documentation

## 2021-05-16 DIAGNOSIS — K5909 Other constipation: Secondary | ICD-10-CM

## 2021-05-16 MED ORDER — LINACLOTIDE 72 MCG PO CAPS
72.0000 ug | ORAL_CAPSULE | Freq: Every day | ORAL | 5 refills | Status: DC
  Filled 2021-05-16: qty 30, 30d supply, fill #0

## 2021-05-16 MED ORDER — PANTOPRAZOLE SODIUM 40 MG PO TBEC
40.0000 mg | DELAYED_RELEASE_TABLET | Freq: Two times a day (BID) | ORAL | 5 refills | Status: DC
  Filled 2021-05-16 – 2021-06-05 (×3): qty 60, 30d supply, fill #0
  Filled 2022-01-15: qty 60, 30d supply, fill #1

## 2021-05-16 NOTE — Patient Instructions (Signed)
Increase pantoprazole to 40mg  twice daily, at breakfast and evening meal. New prescription sent to your pharmacy.  Change Linzess to 110mcg daily for constipation. You do not have to take every day but I would suggest taking at least 2-3 times per week to try to prevent straining. Continue Benefiber daily. Call if your symptoms worsen or do not improve. We will see you back in four months. If persistent abdominal pain, uncontrolled acid reflux, then would recommend upper endoscopy at that time to look at your esophagus and stomach.   1. Aumente pantoprazol a 40 mg [place], en el desayuno y en la cena. Nueva receta enviada a su farmacia. 2. Cambie Linzess a 72 mcg diarios para el estreimiento. No es necesario que lo tome [employer], pero le sugiero que lo tome al menos 2 o 3 veces por semana para tratar de Probation officer. 3. Contine Benefiber diariamente. 4. Llame si sus sntomas empeoran o no mejoran. 5. Nos veremos de regreso en cuatro meses. Si tiene dolor abdominal persistente, reflujo cido descontrolado, recomendara una endoscopia superior en ese momento para examinar el esfago y Product manager.  Opciones de alimentos para pacientes adultos con enfermedad de reflujo gastroesofgico Food Choices for Gastroesophageal Reflux Disease, Adult Si tiene enfermedad de reflujo gastroesofgico (ERGE), los alimentos que consume y los hbitos de alimentacin son muy importantes. Elegir los alimentos adecuados puede ayudar a [employer]. Piense en consultar a un experto en alimentacin (nutricionista) para que lo ayude a Warehouse manager. Consejos para seguir Photographer las etiquetas de los alimentos Elija alimentos que tengan bajo contenido de grasas saturadas. Los alimentos que pueden ayudar con los sntomas incluyen los siguientes: Alimentos que tienen menos del 5 % de los valores diarios (VD) de grasa. Alimentos que tienen 0 gramos de grasas trans. Al cocinar No  frer los alimentos. Cocinar la comida al horno, al vapor, a la plancha o en la parrilla. Estos son mtodos que no necesitan mucha grasa para Audiological scientist. Para agregar sabor, trate de consumir hierbas con bajo contenido de picante y [place]. Planificacin de las comidas  Elegir alimentos saludables con bajo contenido de [place], por ejemplo: Frutas y vegetales. Cereales integrales. Productos lcteos con bajo contenido de [place]. Lysbeth Galas, pescado y aves. Haga comidas pequeas frecuentes en lugar de 3 comidas abundantes al da. Coma lentamente, en un lugar donde est relajado. Evite agacharse o recostarse hasta 2 o 3 horas despus de haber comido. Limite los alimentos con alto contenido graso como las carnes grasas o los alimentos fritos. Limite su ingesta de alimentos grasos, como aceites, [place] y [place]. Evite lo siguiente si el mdico se lo indica: Consumir alimentos que le ocasionen sntomas. Pueden ser distintos para cada persona. Lleve un registro de los alimentos para identificar aquellos que le causen sntomas. Alcohol. Beber mucha cantidad de lquido con las comidas. Comer 2 o 3 horas antes de acostarse. Estilo de vida Mantenga un peso saludable. Pregntele a su mdico cul es el peso saludable para usted. Si necesita perder peso, hable con su mdico para hacerlo de manera segura. Haga actividad fsica durante, al menos, 30 minutos 5 das por semana o ms, o como se lo haya indicado el mdico. Use ropa suelta. No fume ni consuma ningn producto que contenga nicotina o tabaco. Si necesita ayuda para dejar de fumar, consulte al mdico. Duerma con la cabecera de la cama ms elevada que los pies. Use una cua debajo del colchn o bloques  debajo del armazn de la cama para Theatre manager la cabecera de la cama elevada. Mastique chicle sin azcar despus de las comidas. Qu alimentos debo comer? Siga una dieta saludable y bien equilibrada que incluya abundantes frutas, verduras, cereales  integrales, productos lcteos descremados, carnes magras, pescado y aves. Cada persona es diferente. Los alimentos que pueden causar sntomas en una persona pueden no causar sntomas en otra persona. Trabaje con el mdico para hallar alimentos que sean seguros para usted. Es posible que los productos que se enumeran ms [place] no constituyan una lista completa de lo que puede comer y Electronics engineer. Pngase en contacto con un experto en alimentos para conocer ms opciones. Qu alimentos debo evitar? Limitar algunos de estos alimentos puede ayudar a Chief Technology Officer los sntomas de [place]. Cada persona es diferente. Consulte a un experto en alimentacin o a su mdico para que lo ayude a Pension scheme manager los alimentos exactos que debe evitar, si los hubiere. Frutas Cualquier fruta que est preparada con grasa agregada. Cualquier fruta que le ocasione sntomas. Para algunas personas, estas pueden incluir, las frutas ctricas como naranja, pomelo, pia y limn. Verduras Verduras fritas en abundante aceite. Papas fritas. Cualquier verdura que est preparada con grasa agregada. Cualquier verdura que le ocasione sntomas. Para algunas personas, estas pueden incluir tomates y productos con tomate, [place], cebollas y [place], y rbanos picantes. Granos Pasteles o panes sin levadura con grasa agregada. Carnes y otras protenas Carnes de alto contenido graso como carne grasa de vaca o cerdo, salchichas, costillas, jamn, salchicha, salame y tocino. Carnes o protenas fritas, lo que incluye pescado frito y pollo frito. Frutos secos y Engineer, mining de frutos secos, en grandes cantidades. Lcteos Leche entera y [place] con chocolate. Phoebe Sharps. Crema. Helado. Queso crema. Batidos con [employer]. Grasas y [employer]. Margarina. Lardo. Mantequilla clarificada. Bebidas Caf y t negro, con o sin cafena. Bebidas con gas. Refrescos. Bebidas energizantes. Jugo de fruta hecho con frutas cidas, como naranja o pomelo. Jugo de tomate. Bebidas  alcohlicas. Dulces y postres Chocolate y cacao. Rosquillas. Alios y condimentos Pimienta. Menta y mentol. Sal agregada. Cualquier condimento, hierbas o aderezos que le ocasionen sntomas. Para algunas personas, esto puede incluir curry, salsa picante o aderezos para ensalada a base de vinagre. Es posible que los productos que se enumeran ms arriba no constituyan una lista completa de lo que no debera comer y Electronics engineer. Pngase en contacto con un experto en alimentos para conocer ms opciones. Preguntas para hacerle al [employer] Los cambios en la dieta y en el estilo de vida a menudo son los primeros pasos que se toman para Air cabin crew los sntomas de [place]. Si los [employer]son dieta y el estilo de vida no ayudan, hable con el mdico sobre el uso de medicamentos. Dnde buscar ms informacin Control and instrumentation engineer for Gastrointestinal Disorders (Fundacin Internacional para los Trastornos Gastrointestinales): aboutgerd.org Resumen Si tiene [employer], las elecciones de alimentos y el [place] de vida son muy importantes para ayudar a Herbalist sntomas. Haga comidas pequeas durante Psychiatrist de 3 comidas abundantes. Coma lentamente y en un lugar donde est relajado. Evite agacharse o recostarse hasta 2 o 3 horas despus de haber comido. Limite los alimentos con alto contenido graso como las carnes grasas o los alimentos fritos. Esta informacin no tiene Marine scientist el consejo del mdico. Asegrese de hacerle al mdico cualquier pregunta que tenga. Document Revised: 10/30/2019 Document Reviewed: 10/30/2019 Elsevier Patient Education  [place] reflujo gastroesofgico en los adultos Gastroesophageal  Reflux Disease, Adult El reflujo gastroesofgico (RGE) ocurre cuando el cido del estmago sube por el tubo que conecta la boca con el estmago (esfago). Normalmente, la comida baja por el esfago y se mantiene en el estmago, donde se la digiere. Cuando una persona tiene  RGE, los alimentos y el cido estomacal suelen volver al esfago. Usted puede tener una enfermedad llamada enfermedad de reflujo gastroesofgico (ERGE) si el reflujo: Sucede a menudo. Causa sntomas frecuentes o muy intensos. Causa problemas tales como dao en el esfago. Cuando esto ocurre, el esfago duele y se hincha. Con el tiempo, la ERGE puede ocasionar pequeos agujeros (lceras) en el revestimiento del esfago. Cules son las causas? Esta afeccin se debe a un problema en el msculo que se encuentra entre el esfago y [place]. Cuando este msculo est dbil o no es normal, no se cierra correctamente para impedir que los alimentos y el cido regresen del Paramedic. El msculo puede debilitarse debido a lo siguiente: El consumo de [place]. [place]. Tener cierto tipo de hernia (hernia de hiato). Consumo de alcohol. Ciertos alimentos y bebidas, como caf, chocolate, cebollas y Rafael Gonzalez. Qu incrementa el riesgo? Tener sobrepeso. Tener una enfermedad que afecta el tejido conjuntivo. Tomar antiinflamatorios no esteroideos (AINE), como el ibuprofeno. Cules son los signos o sntomas? Acidez estomacal. Dificultad o dolor al tragar. Sensacin de Best boy un bulto en la garganta. Sabor amargo en la boca. Mal aliento. Tener una gran cantidad de saliva. Estmago inflamado o con Tree surgeon. Eructos. Dolor en el pecho. El dolor de pecho puede deberse a distintas afecciones. Asegrese de Teacher, adult education a su mdico si tiene Tourist information centre manager. Dificultad para respirar o sibilancias. Ardelia Mems tos a largo plazo o tos nocturna. Desgaste de la superficie de los dientes (esmalte dental). Prdida de peso. Cmo se trata? Realizar cambios en la dieta. Tomar medicamentos. Someterse a [place]. El tratamiento depender de la gravedad de los sntomas. Siga estas instrucciones en su casa: Comida y bebida  Siga una dieta como se lo haya indicado el mdico. Es posible que deba evitar alimentos y bebidas,  por ejemplo: Caf y t negro, con o sin cafena. Bebidas que contengan alcohol. Bebidas energticas y deportivas. Bebidas gaseosas (carbonatadas) y refrescos. Chocolate y cacao. Menta y [place]. Ajo y cebolla. [place] picante. Alimentos cidos y condimentados. Estos incluyen todos los tipos de pimientos, [place] en polvo, curry en polvo, vinagre, salsas picantes y [employer]power [employer]. Ctricos y sus jugos, por ejemplo, naranjas, limones y limas. Alimentos que [employer]. Estos incluyen salsa roja, [place], salsa picante y pizza con salsa de [place]. Alimentos fritos y Radio broadcast assistant. Estos incluyen donas, papas fritas, papitas fritas de bolsa y aderezos con alto contenido de [place]. Carnes con alto contenido de [place]. Estas incluye los perros calientes, chuletas o costillas, embutidos, jamn y tocino. Productos lcteos ricos en grasas, como leche [place], [place] y [place] crema. Consuma pequeas cantidades de comida con ms frecuencia. Evite consumir porciones abundantes. Evite beber grandes cantidades de lquidos con las comidas. Evite comer 2 o 3 horas antes de acostarse. Evite recostarse inmediatamente despus de comer. No haga ejercicios enseguida despus de comer. Estilo de vida  No fume ni consuma ningn producto que contenga nicotina o tabaco. Si necesita ayuda para dejar de consumir estos productos, consulte al mdico. Intente reducir el nivel de estrs. Si necesita ayuda para hacer esto, consulte al mdico. Si tiene sobrepeso, baje una cantidad de peso saludable para usted. Consulte a su mdico para bajar de peso de [place]  segura. Instrucciones generales Est atento a cualquier cambio en los sntomas. Tome los medicamentos de venta libre y los recetados solamente como se lo haya indicado el mdico. No tome aspirina, ibuprofeno ni otros antiinflamatorios no esteroideos (AINE) a menos que el mdico lo autorice. Use ropa holgada. No use nada apretado alrededor de la cintura. Levante  (eleve) la cabecera de la cama aproximadamente 6 pulgadas (15 cm). Para hacerlo, es posible que tenga que utilizar una cua. Evite inclinarse si al hacerlo empeoran los sntomas. Cumpla con todas las visitas de seguimiento. Comunquese con un mdico si: Aparecen nuevos sntomas. [place] y no sabe por qu. Tiene problemas para tragar o le duele cuando traga. Tiene sibilancias o tos persistente. Tiene la voz ronca. Los sntomas no mejoran con Dispensing optician. Solicite ayuda de inmediato si: Tree surgeon repentino [employer], el cuello, la [place], los dientes o la espalda. De repente se siente transpirado, mareado o aturdido. Siente falta de aire o Tourist information centre manager. Vomita y el vmito es de color verde, amarillo o negro, o tiene un aspecto similar a la sangre o a los posos de caf. Se desmaya. Las heces (deposiciones) son rojas, sanguinolentas o negras. No puede tragar, beber o comer. Estos sntomas pueden representar un problema grave que constituye Engineer, maintenance (IT). No espere a ver si los sntomas desaparecen. Solicite atencin mdica de inmediato. Comunquese con el servicio de emergencias de su localidad (911 en los [place]). No conduzca por sus propios medios Principal Financial. Resumen Si una persona tiene enfermedad de reflujo gastroesofgico (ERGE), los alimentos y el cido estomacal suben al esfago y causan sntomas o problemas tales como dao en el esfago. El tratamiento depender de la gravedad de los sntomas. Siga una dieta como se lo haya indicado el mdico. Use todos los medicamentos solamente como se lo haya indicado el mdico. Esta informacin no tiene Marine scientist el consejo del mdico. Asegrese de hacerle al mdico cualquier pregunta que tenga. Document Revised: 10/30/2019 Document Reviewed: 10/30/2019 Elsevier Patient Education  [place].

## 2021-05-17 ENCOUNTER — Other Ambulatory Visit: Payer: Self-pay

## 2021-05-22 NOTE — Patient Instructions (Signed)
Instrucciones Para Antes de la Ciruga   Su ciruga est programada para-(your procedure is scheduled on) 05/26/2021 at [facility] - (enter)    Por favor llame al [phone] si tiene algn problema en la maana de la ciruga. (please call if you have any problems the morning of surgery.)                  Recuerde: (Remember)   No coma alimentos ni tome lquidos, incluyendo agua, despus de la medianoche del  (Do not eat food or drink liquids including water after midnight on 05/25/2021.   Tome estas medicinas en la maana de la ciruga con un SORBITO de agua (take these meds the morning of surgery with a SIP of water)  norvasc, levothyroxine, mobic (if needed), protonix.    Use your inhalers before you come and bring your rescue inhaler with you.   Puede cepillarse los dientes en la maana de la [place]. (you may brush your teeth the morning of surgery)   No use joyas, maquillaje de ojos, lpiz labial, crema para el cuerpo o esmalte de uas oscuro. (Do not wear jewelry, eye makeup, lipstick, body lotion, or dark fingernail polish)   No puede usar desodorante. (you may wear deodorant)   Si va a ser ingresado despues de la ciruga, deje la maleta en el carro hasta que se le haya asignado una habitacin. (If you are to be admitted after surgery, leave suitcase in car until your room has been assigned.)   A los pacientes que se les d de alta el mismo da no se les permitir manejar a casa.  (Patients discharged on the day of surgery will not be allowed to drive home)   Use ropa suelta y cmoda de regreso a casa. (wear loose comfortable clothes for ride home)       Colecistectoma mnimamente invasiva, cuidados posteriores Minimally Invasive Cholecystectomy, Care After Qu puedo esperar despus del procedimiento? Despus del procedimiento, es comn [employer] siguientes sntomas: Education officer, environmental en las zonas de la  [place]. Le darn medicamentos para Conservation officer, historic buildings. Vomitar o tener nuseas. Sentir el vientre lleno (meteorismo) o Patent attorney en el hombro. Esto se debe al gas que se [place] durante la [place]. Siga estas instrucciones en su casa: Medicamentos Use los medicamentos de venta libre y los recetados solamente como se lo haya indicado el mdico. Si le recetaron un antibitico, tmelo como se lo haya indicado el mdico. No deje de tomarlo aunque comience a sentirse mejor. Si se lo indican, tome medidas a fin de prevenir problemas para ir de cuerpo (estreimiento). Es posible que deba hacer lo siguiente: Electronics engineer suficiente lquido para Contractor pis (la orina) de color amarillo plido. Tomar medicamentos. Le dirn qu medicamentos debe tomar. [place] alimentos ricos en fibra. Entre ellos, frijoles, cereales integrales y frutas y verduras frescas. Limitar los alimentos con alto contenido de grasa y Location manager. Estos incluyen alimentos fritos o dulces. Pregunte al mdico si debe evitar conducir o Risk manager mquinas mientras toma los medicamentos. Cuidado de la incisin  Siga las instrucciones del mdico en lo que respecta al cuidado de los cortes de la ciruga (incisiones). Asegrese de hacer lo siguiente: Marcella Dubs  las manos con agua y jabn durante al menos 20 segundos antes y despus de cambiarse la venda (vendaje). Use un desinfectante para manos si no dispone de [place] y [place]. Cmbiese la venda. Deje los puntos (suturas) o la goma para cerrar la piel en su lugar durante al menos 2 semanas. Deje colocadas las tiras de [place] a menos que se le indique que se las quite. Puede recortar los bordes de las tiras de cinta si se enrollan. No tome baos de inmersin, no practique natacin ni utilice el jacuzzi. Pregunte a su mdico si puede ducharse o darse baos de esponja. Controle la zona de la incisin todos los das para detectar signos de infeccin. Est atento a los siguientes signos: Aumento del enrojecimiento,  la hinchazn o Conservation officer, historic buildings. Lquido o sangre. Calor. Pus o mal olor. Actividad Haga reposo como se lo haya indicado el mdico. No realice actividades que requieran mucho esfuerzo. Levntese y camine un poco cada 1 a 2 horas. Pida ayuda si se siente dbil o inestable. No levante objetos que pesen ms de 10 libras (4.5 kg) o que sean ms pesados de lo que le indicaron. No practique deportes de contacto hasta que el mdico lo autorice. No retome el trabajo ni el estudio hasta que el mdico lo autorice. Retome sus actividades normales cuando el mdico le diga que es seguro. Instrucciones generales Si le administraron un sedante durante el procedimiento, no conduzca ni use mquinas hasta que el mdico le indique que es seguro [place]. Un sedante es un medicamento que ayuda a [place]. Concurra a [place]. Comunquese con un mdico si: Aparece una erupcin cutnea. Aumentan el enrojecimiento, la hinchazn o el dolor alrededor de las incisiones. [place] incisiones. Las incisiones estn calientes al tacto. Tiene pus o percibe que sale mal olor del lugar de las incisiones. Tiene fiebre. Una o ms de las incisiones se abren. Solicite ayuda de inmediato si: Tiene dificultad para respirar. Siente dolor en el pecho. Siente dolor que empeora en la zona de los hombros. Se desmaya o se siente mareado al ponerse de pie. Tiene dolor muy intenso en el vientre (abdomen). Siente que va a vomitar o vomita y esto dura ms de Optician, dispensing. Siente dolor en la pierna. Estos sntomas pueden Sales executive. Solicite ayuda de inmediato. Llame al 911. No espere a ver si los sntomas desaparecen. No conduzca por sus propios medios [employer] Financial. Resumen Despus de la [place], es comn sentir dolor en las zonas de la [place]. Tambin puede tener vmitos o sentir llenura en el vientre. Siga las instrucciones del mdico acerca de los medicamentos, la restriccin  de actividades y el cuidado de las zonas de la [place]. No realice actividades que requieran mucho esfuerzo. Comunquese con el mdico si tiene fiebre u otros signos de infeccin, como ms enrojecimiento, hinchazn o dolor alrededor [employer]. [place] ayuda de inmediato si tiene dolor de pecho, Social research officer, government en los hombros que va en aumento o problemas para Ambulance person. Esta informacin no tiene Marine scientist el consejo del mdico. Asegrese de hacerle al mdico cualquier pregunta que tenga. Document Revised: 10/05/2020 Document Reviewed: 10/05/2020 Elsevier Patient Education  2022 [place] general en adultos, cuidados posteriores General Anesthesia, Adult, Care After Esta hoja le brinda informacin sobre cmo cuidarse despus del procedimiento. El mdico tambin podr darle instrucciones ms especficas. Comunquese con el mdico si tiene problemas o preguntas. Qu puedo esperar despus del  procedimiento? Luego del procedimiento, son comunes los siguiente efectos secundarios: Dolor o Scientist, research (life sciences) en el lugar de la va intravenosa (i.v.). Nuseas. Vmitos. El dolor de Investment banker, operational. Dificultad para concentrarte. Sentir fro o [employer]. Sensacin de debilidad o cansancio. Somnolencia y Programmer, applications. Malestar y Hydrologist. Estos efectos secundarios pueden afectar partes del cuerpo que no estuvieron involucradas en la ciruga. Siga estas instrucciones en su casa: Durante el perodo de [employer] Energy le haya indicado el mdico:  Descanse. No participe en actividades que impliquen posibles cadas o lesiones. No conduzca ni opere maquinaria. No beba alcohol. No tome somnferos ni medicamentos que causen somnolencia. No tome decisiones trascendentes ni firme documentos importantes. No cuide a nios por su cuenta. Comida y bebida Siga las indicaciones del mdico respecto de las restricciones de comidas o bebidas. Cuando [employer], comience a comer cantidades pequeas de  alimentos que sean blandos y fciles de Publishing copy (livianos), como una tostada. Retome su dieta habitual de forma gradual. Beber suficiente lquido como para mantener la orina de color amarillo plido. Si vomita, rehidrtese tomando agua, jugo o caldo transparente. Instrucciones generales Si tiene apnea del sueo, la [place] y ciertos medicamentos pueden elevar su riesgo de tener problemas respiratorios. Siga las instrucciones del mdico respecto al uso del dispositivo para dormir: Siempre que duerma, [place] siestas que tome en [employer]. Mientras tome analgsicos recetados, medicamentos para dormir o medicamentos que producen somnolencia. Pida a un adulto responsable que se quede con usted durante el tiempo que se le indique. Es importante tener a alguien que lo ayude a cuidarse hasta que est despierto y Press photographer. Retome sus actividades normales segn lo indicado por el mdico. Pregntele al mdico qu actividades son seguras para usted. Use los medicamentos de venta libre y los recetados solamente como se lo haya indicado el mdico. Si fuma, no lo haga sin supervisin. Concurra a todas las visitas de seguimiento como se lo haya indicado el mdico. Esto es importante. Comunquese con un mdico si: Tiene nuseas o vmitos que no mejoran con medicamentos. No puede comer ni beber sin vomitar. Su dolor no se alivia con medicamentos. No puede orinar. Tiene una erupcin cutnea. Tiene fiebre. Presenta enrojecimiento alrededor del lugar de la va intravenosa (i.v.) que empeora. Solicite ayuda de inmediato si: Tienes dificultad para respirar. Sientes dolor en el pecho. Observa sangre en la orina o heces, o vomita sangre. Resumen Despus del procedimiento, es comn tener dolor de garganta y nuseas. Tambin es comn sentirse cansado. Pida a un adulto responsable que se quede con usted durante el tiempo que se le indique. Es importante tener a alguien que lo ayude a cuidarse hasta que  est despierto y Press photographer. Cuando [employer], comience a comer cantidades pequeas de alimentos que sean blandos y fciles de Publishing copy (livianos), como una tostada. Retome su dieta habitual de forma gradual. Beber suficiente lquido como para mantener la orina de color amarillo plido. Retome sus actividades normales segn lo indicado por el mdico. Pregntele al mdico qu actividades son seguras para usted. Esta informacin no tiene Marine scientist el consejo del mdico. Asegrese de hacerle al mdico cualquier pregunta que tenga. Document Revised: 09/10/2019 Document Reviewed: 09/10/2019 Elsevier Patient Education  [place].

## 2021-05-23 ENCOUNTER — Encounter (HOSPITAL_COMMUNITY): Payer: Self-pay

## 2021-05-23 ENCOUNTER — Encounter (HOSPITAL_COMMUNITY)
Admission: RE | Admit: 2021-05-23 | Discharge: 2021-05-23 | Disposition: A | Discharge status: Home / Self Care | Payer: No Typology Code available for payment source | Source: Ambulatory Visit | Attending: General Surgery | Admitting: General Surgery

## 2021-05-23 ENCOUNTER — Other Ambulatory Visit: Payer: Self-pay

## 2021-05-23 DIAGNOSIS — K8020 Calculus of gallbladder without cholecystitis without obstruction: Secondary | ICD-10-CM

## 2021-05-23 DIAGNOSIS — Z01818 Encounter for other preprocedural examination: Secondary | ICD-10-CM | POA: Insufficient documentation

## 2021-05-23 HISTORY — DX: Hypothyroidism, unspecified: E03.9

## 2021-05-23 HISTORY — DX: Essential (primary) hypertension: I10

## 2021-05-23 LAB — CBC WITH DIFFERENTIAL/PLATELET
Abs Immature Granulocytes: 0.02 10*3/uL (ref 0.00–0.07)
Basophils Absolute: 0 10*3/uL (ref 0.0–0.1)
Basophils Relative: 1 %
Eosinophils Absolute: 0.2 10*3/uL (ref 0.0–0.5)
Eosinophils Relative: 2 %
HCT: 42.2 % (ref 36.0–46.0)
Hemoglobin: 13.8 g/dL (ref 12.0–15.0)
Immature Granulocytes: 0 %
Lymphocytes Relative: 31 %
Lymphs Abs: 2.4 10*3/uL (ref 0.7–4.0)
MCH: 29.1 pg (ref 26.0–34.0)
MCHC: 32.7 g/dL (ref 30.0–36.0)
MCV: 88.8 fL (ref 80.0–100.0)
Monocytes Absolute: 0.8 10*3/uL (ref 0.1–1.0)
Monocytes Relative: 10 %
Neutro Abs: 4.4 10*3/uL (ref 1.7–7.7)
Neutrophils Relative %: 56 %
Platelets: 414 10*3/uL — ABNORMAL HIGH (ref 150–400)
RBC: 4.75 MIL/uL (ref 3.87–5.11)
RDW: 13.3 % (ref 11.5–15.5)
WBC: 7.7 10*3/uL (ref 4.0–10.5)
nRBC: 0 % (ref 0.0–0.2)

## 2021-05-23 LAB — COMPREHENSIVE METABOLIC PANEL
ALT: 20 U/L (ref 0–44)
AST: 21 U/L (ref 15–41)
Albumin: 4.5 g/dL (ref 3.5–5.0)
Alkaline Phosphatase: 105 U/L (ref 38–126)
Anion gap: 8 (ref 5–15)
BUN: 15 mg/dL (ref 8–23)
CO2: 28 mmol/L (ref 22–32)
Calcium: 9.4 mg/dL (ref 8.9–10.3)
Chloride: 105 mmol/L (ref 98–111)
Creatinine, Ser: 0.52 mg/dL (ref 0.44–1.00)
GFR, Estimated: >60 mL/min (ref >60)
Glucose, Bld: 80 mg/dL (ref 70–99)
Potassium: 4.1 mmol/L (ref 3.5–5.1)
Sodium: 141 mmol/L (ref 135–145)
Total Bilirubin: 0.4 mg/dL (ref 0.3–1.2)
Total Protein: 7.9 g/dL (ref 6.5–8.1)

## 2021-05-26 ENCOUNTER — Encounter (HOSPITAL_COMMUNITY): Payer: Self-pay | Admitting: General Surgery

## 2021-05-26 ENCOUNTER — Ambulatory Visit (HOSPITAL_COMMUNITY): Payer: No Typology Code available for payment source | Admitting: Anesthesiology

## 2021-05-26 ENCOUNTER — Ambulatory Visit (HOSPITAL_COMMUNITY)
Admission: RE | Admit: 2021-05-26 | Discharge: 2021-05-26 | Disposition: A | Discharge status: Home / Self Care | Payer: No Typology Code available for payment source | Attending: General Surgery | Admitting: General Surgery

## 2021-05-26 ENCOUNTER — Ambulatory Visit (HOSPITAL_BASED_OUTPATIENT_CLINIC_OR_DEPARTMENT_OTHER): Payer: No Typology Code available for payment source | Admitting: Anesthesiology

## 2021-05-26 ENCOUNTER — Other Ambulatory Visit: Payer: Self-pay

## 2021-05-26 ENCOUNTER — Encounter (HOSPITAL_COMMUNITY)
Admission: RE | Disposition: A | Discharge status: Home / Self Care | Payer: Self-pay | Source: Home / Self Care | Attending: General Surgery

## 2021-05-26 DIAGNOSIS — J45909 Unspecified asthma, uncomplicated: Secondary | ICD-10-CM | POA: Insufficient documentation

## 2021-05-26 DIAGNOSIS — K8064 Calculus of gallbladder and bile duct with chronic cholecystitis without obstruction: Secondary | ICD-10-CM | POA: Insufficient documentation

## 2021-05-26 DIAGNOSIS — K219 Gastro-esophageal reflux disease without esophagitis: Secondary | ICD-10-CM | POA: Insufficient documentation

## 2021-05-26 DIAGNOSIS — R519 Headache, unspecified: Secondary | ICD-10-CM | POA: Insufficient documentation

## 2021-05-26 DIAGNOSIS — I10 Essential (primary) hypertension: Secondary | ICD-10-CM | POA: Insufficient documentation

## 2021-05-26 DIAGNOSIS — K8070 Calculus of gallbladder and bile duct without cholecystitis without obstruction: Secondary | ICD-10-CM

## 2021-05-26 DIAGNOSIS — E039 Hypothyroidism, unspecified: Secondary | ICD-10-CM | POA: Insufficient documentation

## 2021-05-26 DIAGNOSIS — K8020 Calculus of gallbladder without cholecystitis without obstruction: Secondary | ICD-10-CM

## 2021-05-26 HISTORY — PX: GALLBLADDER SURGERY: SHX652

## 2021-05-26 HISTORY — PX: CHOLECYSTECTOMY: SHX55

## 2021-05-26 SURGERY — LAPAROSCOPIC CHOLECYSTECTOMY
Anesthesia: General | Site: Abdomen

## 2021-05-26 MED ORDER — KETOROLAC TROMETHAMINE 30 MG/ML IJ SOLN
INTRAMUSCULAR | Status: AC
  Filled 2021-05-26: qty 1

## 2021-05-26 MED ORDER — LIDOCAINE HCL (PF) 2 % IJ SOLN
INTRAMUSCULAR | Status: AC
  Filled 2021-05-26: qty 5

## 2021-05-26 MED ORDER — HYDROMORPHONE HCL 1 MG/ML IJ SOLN
0.2500 mg | INTRAMUSCULAR | Status: DC | PRN
  Administered 2021-05-26: 0.5 mg via INTRAVENOUS
  Filled 2021-05-26: qty 0.5

## 2021-05-26 MED ORDER — MEPERIDINE HCL 50 MG/ML IJ SOLN: 6.2500 mg | INTRAMUSCULAR | Status: DC | PRN

## 2021-05-26 MED ORDER — CHLORHEXIDINE GLUCONATE 0.12 % MT SOLN
15.0000 mL | Freq: Once | OROMUCOSAL | Status: AC
  Administered 2021-05-26: 15 mL via OROMUCOSAL

## 2021-05-26 MED ORDER — CIPROFLOXACIN IN D5W 400 MG/200ML IV SOLN
400.0000 mg | INTRAVENOUS | Status: AC
  Administered 2021-05-26: 400 mg via INTRAVENOUS
  Filled 2021-05-26: qty 200

## 2021-05-26 MED ORDER — PROPOFOL 10 MG/ML IV BOLUS
INTRAVENOUS | Status: AC
  Filled 2021-05-26: qty 20

## 2021-05-26 MED ORDER — OXYCODONE-ACETAMINOPHEN 5-325 MG PO TABS
ORAL_TABLET | ORAL | Status: AC
  Filled 2021-05-26: qty 1

## 2021-05-26 MED ORDER — ROCURONIUM BROMIDE 10 MG/ML (PF) SYRINGE
PREFILLED_SYRINGE | INTRAVENOUS | Status: DC | PRN
  Administered 2021-05-26: 50 mg via INTRAVENOUS

## 2021-05-26 MED ORDER — ONDANSETRON HCL 4 MG/2ML IJ SOLN
INTRAMUSCULAR | Status: DC | PRN
  Administered 2021-05-26: 4 mg via INTRAVENOUS

## 2021-05-26 MED ORDER — TRAMADOL HCL 50 MG PO TABS: 50.0000 mg | ORAL_TABLET | Freq: Four times a day (QID) | ORAL | 0 refills | Status: DC | PRN

## 2021-05-26 MED ORDER — MIDAZOLAM HCL 2 MG/2ML IJ SOLN
INTRAMUSCULAR | Status: DC | PRN
  Administered 2021-05-26: 2 mg via INTRAVENOUS

## 2021-05-26 MED ORDER — FENTANYL CITRATE (PF) 250 MCG/5ML IJ SOLN
INTRAMUSCULAR | Status: AC
  Filled 2021-05-26: qty 5

## 2021-05-26 MED ORDER — BUPIVACAINE LIPOSOME 1.3 % IJ SUSP
INTRAMUSCULAR | Status: DC | PRN
  Administered 2021-05-26: 20 mL

## 2021-05-26 MED ORDER — CHLORHEXIDINE GLUCONATE CLOTH 2 % EX PADS: 6.0000 | MEDICATED_PAD | Freq: Once | CUTANEOUS | Status: DC

## 2021-05-26 MED ORDER — PROPOFOL 10 MG/ML IV BOLUS
INTRAVENOUS | Status: DC | PRN
  Administered 2021-05-26: 200 mg via INTRAVENOUS

## 2021-05-26 MED ORDER — OXYCODONE-ACETAMINOPHEN 5-325 MG PO TABS
1.0000 | ORAL_TABLET | Freq: Once | ORAL | Status: AC
  Administered 2021-05-26: 1 via ORAL

## 2021-05-26 MED ORDER — SCOPOLAMINE 1 MG/3DAYS TD PT72
1.0000 | MEDICATED_PATCH | Freq: Once | TRANSDERMAL | Status: DC
  Administered 2021-05-26: 1.5 mg via TRANSDERMAL

## 2021-05-26 MED ORDER — DEXAMETHASONE SODIUM PHOSPHATE 10 MG/ML IJ SOLN
INTRAMUSCULAR | Status: AC
  Filled 2021-05-26: qty 1

## 2021-05-26 MED ORDER — DEXAMETHASONE SODIUM PHOSPHATE 4 MG/ML IJ SOLN
INTRAMUSCULAR | Status: DC | PRN
  Administered 2021-05-26: 4 mg via INTRAVENOUS

## 2021-05-26 MED ORDER — SODIUM CHLORIDE 0.9 % IR SOLN
Status: DC | PRN
Start: 2021-05-26 — End: 2021-05-26
  Administered 2021-05-26: 1000 mL

## 2021-05-26 MED ORDER — SCOPOLAMINE 1 MG/3DAYS TD PT72
MEDICATED_PATCH | TRANSDERMAL | Status: AC
  Filled 2021-05-26: qty 1

## 2021-05-26 MED ORDER — ONDANSETRON HCL 4 MG/2ML IJ SOLN: 4.0000 mg | Freq: Once | INTRAMUSCULAR | Status: DC | PRN

## 2021-05-26 MED ORDER — SUGAMMADEX SODIUM 200 MG/2ML IV SOLN
INTRAVENOUS | Status: DC | PRN
  Administered 2021-05-26: 350 mg via INTRAVENOUS

## 2021-05-26 MED ORDER — BUPIVACAINE LIPOSOME 1.3 % IJ SUSP
INTRAMUSCULAR | Status: AC
  Filled 2021-05-26: qty 20

## 2021-05-26 MED ORDER — MIDAZOLAM HCL 2 MG/2ML IJ SOLN
INTRAMUSCULAR | Status: AC
  Filled 2021-05-26: qty 2

## 2021-05-26 MED ORDER — LIDOCAINE 2% (20 MG/ML) 5 ML SYRINGE
INTRAMUSCULAR | Status: DC | PRN
  Administered 2021-05-26: 100 mg via INTRAVENOUS

## 2021-05-26 MED ORDER — LACTATED RINGERS IV SOLN: INTRAVENOUS | Status: DC

## 2021-05-26 MED ORDER — ORAL CARE MOUTH RINSE: 15.0000 mL | Freq: Once | OROMUCOSAL | Status: AC

## 2021-05-26 MED ORDER — HEMOSTATIC AGENTS (NO CHARGE) OPTIME
TOPICAL | Status: DC | PRN
  Administered 2021-05-26: 1 via TOPICAL

## 2021-05-26 MED ORDER — FENTANYL CITRATE (PF) 250 MCG/5ML IJ SOLN
INTRAMUSCULAR | Status: DC | PRN
  Administered 2021-05-26 (×2): 50 ug via INTRAVENOUS

## 2021-05-26 MED ORDER — ONDANSETRON HCL 4 MG/2ML IJ SOLN
INTRAMUSCULAR | Status: AC
  Filled 2021-05-26: qty 2

## 2021-05-26 MED ORDER — ROCURONIUM BROMIDE 10 MG/ML (PF) SYRINGE
PREFILLED_SYRINGE | INTRAVENOUS | Status: AC
  Filled 2021-05-26: qty 10

## 2021-05-26 SURGICAL SUPPLY — 42 items
APPLICATOR ARISTA FLEXITIP XL (MISCELLANEOUS) IMPLANT
APPLIER CLIP ROT 10 11.4 M/L (STAPLE) ×2
BAG RETRIEVAL 10 (BASKET) ×1
CHLORAPREP W/TINT 26 (MISCELLANEOUS) ×2 IMPLANT
CLIP APPLIE ROT 10 11.4 M/L (STAPLE) ×1 IMPLANT
CLOTH BEACON ORANGE TIMEOUT ST (SAFETY) ×2 IMPLANT
COVER LIGHT HANDLE STERIS (MISCELLANEOUS) ×4 IMPLANT
DERMABOND ADVANCED (GAUZE/BANDAGES/DRESSINGS) ×1
DERMABOND ADVANCED .7 DNX12 (GAUZE/BANDAGES/DRESSINGS) ×1 IMPLANT
ELECT REM PT RETURN 9FT ADLT (ELECTROSURGICAL) ×2
ELECTRODE REM PT RTRN 9FT ADLT (ELECTROSURGICAL) ×1 IMPLANT
GAUZE 4X4 16PLY ~~LOC~~+RFID DBL (SPONGE) ×2 IMPLANT
GLOVE SURG POLYISO LF SZ7.5 (GLOVE) ×2 IMPLANT
GLOVE SURG UNDER POLY LF SZ7 (GLOVE) ×4 IMPLANT
GOWN STRL REUS W/TWL LRG LVL3 (GOWN DISPOSABLE) ×6 IMPLANT
HEMOSTAT ARISTA ABSORB 3G PWDR (HEMOSTASIS) IMPLANT
HEMOSTAT SNOW SURGICEL 2X4 (HEMOSTASIS) ×2 IMPLANT
INST SET LAPROSCOPIC AP (KITS) ×2 IMPLANT
KIT TURNOVER KIT A (KITS) ×2 IMPLANT
MANIFOLD NEPTUNE II (INSTRUMENTS) ×2 IMPLANT
NDL HYPO 18GX1.5 BLUNT FILL (NEEDLE) ×1 IMPLANT
NDL HYPO 21X1.5 SAFETY (NEEDLE) ×1 IMPLANT
NDL INSUFFLATION 14GA 120MM (NEEDLE) ×1 IMPLANT
NEEDLE HYPO 18GX1.5 BLUNT FILL (NEEDLE) ×2 IMPLANT
NEEDLE HYPO 21X1.5 SAFETY (NEEDLE) ×2 IMPLANT
NEEDLE INSUFFLATION 14GA 120MM (NEEDLE) ×2 IMPLANT
NS IRRIG 1000ML POUR BTL (IV SOLUTION) ×2 IMPLANT
PACK LAP CHOLE LZT030E (CUSTOM PROCEDURE TRAY) ×2 IMPLANT
PAD ARMBOARD 7.5X6 YLW CONV (MISCELLANEOUS) ×2 IMPLANT
SET BASIN LINEN APH (SET/KITS/TRAYS/PACK) ×2 IMPLANT
SET TUBE SMOKE EVAC HIGH FLOW (TUBING) ×2 IMPLANT
SLEEVE ENDOPATH XCEL 5M (ENDOMECHANICALS) ×2 IMPLANT
SUT MNCRL AB 4-0 PS2 18 (SUTURE) ×4 IMPLANT
SUT VICRYL 0 UR6 27IN ABS (SUTURE) ×2 IMPLANT
SYR 20ML LL LF (SYRINGE) ×3 IMPLANT
SYS BAG RETRIEVAL 10MM (BASKET) ×1
SYSTEM BAG RETRIEVAL 10MM (BASKET) ×1 IMPLANT
TROCAR ENDO BLADELESS 11MM (ENDOMECHANICALS) ×2 IMPLANT
TROCAR XCEL NON-BLD 5MMX100MML (ENDOMECHANICALS) ×2 IMPLANT
TROCAR XCEL UNIV SLVE 11M 100M (ENDOMECHANICALS) ×2 IMPLANT
TUBE CONNECTING 12X1/4 (SUCTIONS) ×2 IMPLANT
WARMER LAPAROSCOPE (MISCELLANEOUS) ×2 IMPLANT

## 2021-05-26 NOTE — Op Note (Signed)
Patient:  Alexandra Brewer  DOB:  Apr 17, 1955  MRN:  [id number]   Preop Diagnosis: Biliary colic, cholelithiasis  Postop Diagnosis: Same  Procedure: Laparoscopic cholecystectomy  Surgeon: Aviva Signs, MD  Anes: General endotracheal  Indications: Patient is a 66 year old Hispanic female who presents with biliary colic secondary to cholelithiasis.  The risks and benefits of the procedure including bleeding, infection, hepatobiliary injury, and the possibility of an open procedure were fully explained to the patient, who gave informed consent.  Procedure note: The patient was placed in the supine position.  After induction of general endotracheal anesthesia, the abdomen was prepped and draped using the usual sterile technique with ChloraPrep.  Surgical site confirmation was performed.  A supraumbilical incision was made down to the fascia.  A Veress needle was introduced into the abdominal cavity and confirmation of placement was done using the saline drop test.  The abdomen was then insufflated to 15 mmHg pressure.  An 11 mm trocar was introduced into the abdominal cavity under direct visualization without difficulty.  The patient was placed in reverse Trendelenburg position and an additional 11 mm trocar was placed in the epigastric region and 5 mm trocars were placed in the right upper quadrant and right flank regions.  Liver was inspected and noted to be within normal limits.  The gallbladder was retracted in a dynamic fashion in order to provide a critical view of the triangle of Calot.  The cystic duct was first identified.  Its junction to the infundibulum was fully identified.  Endoclips were placed proximally and distally on the cystic duct, and the cystic duct was divided.  This was likewise done to the cystic artery.  The gallbladder was freed away from the gallbladder fossa using Bovie electrocautery.  The gallbladder was delivered through the epigastric trocar site using an Endo Catch  bag.  The gallbladder fossa was inspected and no abnormal bleeding or bile leakage was noted.  Surgicel was placed in the gallbladder fossa.  All fluid and air were then evacuated from the abdominal cavity prior to removal of the trocars.  All wounds were irrigated with normal saline.  All wounds were injected with Exparel.  The supraumbilical fascia was reapproximated using an 0 Vicryl interrupted suture.  All skin incisions were closed using a 4-0 Monocryl subcuticular suture.  Dermabond was applied.  All tape and needle counts were correct at the end of the procedure.  The patient was extubated in the operating room and transferred to PACU in stable condition.  Complications: None  EBL: Minimal  Specimen: Gallbladder

## 2021-05-26 NOTE — Transfer of Care (Signed)
Immediate Anesthesia Transfer of Care Note  Patient: Micala Saltsman  Procedure(s) Performed: LAPAROSCOPIC CHOLECYSTECTOMY (Abdomen)  Patient Location: PACU  Anesthesia Type:General  Level of Consciousness: awake  Airway & Oxygen Therapy: Patient Spontanous Breathing and Patient connected to face mask oxygen  Post-op Assessment: Report given to RN and Post -op Vital signs reviewed and stable  Post vital signs: Reviewed and stable  Last Vitals:  Vitals Value Taken Time  BP    Temp    Pulse 53 05/26/21 0824  Resp 12 05/26/21 0824  SpO2 100 % 05/26/21 0824  Vitals shown include unvalidated device data.  Last Pain:  Vitals:   05/26/21 0626  TempSrc: Oral  PainSc: 0-No pain         Complications: No notable events documented.

## 2021-05-26 NOTE — Interval H&P Note (Signed)
History and Physical Interval Note:  05/26/2021 7:21 AM  Alexandra Brewer  has presented today for surgery, with the diagnosis of Cholelithiasis.  The various methods of treatment have been discussed with the patient and family. After consideration of risks, benefits and other options for treatment, the patient has consented to  Procedure(s): LAPAROSCOPIC CHOLECYSTECTOMY (N/A) as a surgical intervention.  The patient's history has been reviewed, patient examined, no change in status, stable for surgery.  I have reviewed the patient's chart and labs.  Questions were answered to the patient's satisfaction.     Aviva Signs

## 2021-05-26 NOTE — Progress Notes (Signed)
Interpreter used for pre op assessments

## 2021-05-26 NOTE — Anesthesia Procedure Notes (Signed)
Procedure Name: Intubation Date/Time: 05/26/2021 7:42 AM Performed by: Orlie Dakin, CRNA Pre-anesthesia Checklist: Patient identified, Emergency Drugs available, Suction available and Patient being monitored Patient Re-evaluated:Patient Re-evaluated prior to induction Oxygen Delivery Method: Circle system utilized Preoxygenation: Pre-oxygenation with 100% oxygen Induction Type: IV induction Ventilation: Mask ventilation without difficulty Laryngoscope Size: Miller and 3 Grade View: Grade II Tube type: Oral Tube size: 7.0 mm Number of attempts: 1 Airway Equipment and Method: Stylet Placement Confirmation: ETT inserted through vocal cords under direct vision, positive ETCO2 and breath sounds checked- equal and bilateral Secured at: 22 cm Tube secured with: Tape Dental Injury: Teeth and Oropharynx as per pre-operative assessment  Comments: 4x4s bite block used.

## 2021-05-26 NOTE — Anesthesia Preprocedure Evaluation (Signed)
Anesthesia Evaluation  Patient identified by MRN, date of birth, ID band Patient awake    Reviewed: Allergy & Precautions, NPO status , Patient's Chart, lab work & pertinent test results  Airway Mallampati: II  TM Distance: >3 FB Neck ROM: Full    Dental  (+) Dental Advisory Given, Missing   Pulmonary asthma ,    Pulmonary exam normal breath sounds clear to auscultation       Cardiovascular Exercise Tolerance: Good hypertension, Pt. on medications Normal cardiovascular exam Rhythm:Regular Rate:Normal     Neuro/Psych  Headaches,  Neuromuscular disease negative psych ROS   GI/Hepatic Neg liver ROS, GERD  Medicated,  Endo/Other  Hypothyroidism   Renal/GU negative Renal ROS  negative genitourinary   Musculoskeletal  (+) Arthritis , Osteoarthritis,    Abdominal   Peds negative pediatric ROS (+)  Hematology negative hematology ROS (+)   Anesthesia Other Findings   Reproductive/Obstetrics negative OB ROS                            Anesthesia Physical Anesthesia Plan  ASA: 2  Anesthesia Plan: General   Post-op Pain Management: Dilaudid IV   Induction: Intravenous  PONV Risk Score and Plan: 4 or greater and Ondansetron, Dexamethasone, Midazolam and Scopolamine patch - Pre-op  Airway Management Planned: Oral ETT  Additional Equipment:   Intra-op Plan:   Post-operative Plan: Extubation in OR  Informed Consent: I have reviewed the patients History and Physical, chart, labs and discussed the procedure including the risks, benefits and alternatives for the proposed anesthesia with the patient or authorized representative who has indicated his/her understanding and acceptance.     Dental advisory given  Plan Discussed with: CRNA and Surgeon  Anesthesia Plan Comments:        Anesthesia Quick Evaluation

## 2021-05-26 NOTE — Anesthesia Postprocedure Evaluation (Signed)
Anesthesia Post Note  Patient: Alexandra Brewer  Procedure(s) Performed: LAPAROSCOPIC CHOLECYSTECTOMY (Abdomen)  Patient location during evaluation: Phase II Anesthesia Type: General Level of consciousness: awake and alert and oriented Pain management: pain level controlled Vital Signs Assessment: post-procedure vital signs reviewed and stable Respiratory status: spontaneous breathing, nonlabored ventilation and respiratory function stable Cardiovascular status: blood pressure returned to baseline and stable Postop Assessment: no apparent nausea or vomiting Anesthetic complications: no   No notable events documented.   Last Vitals:  Vitals:   05/26/21 0924 05/26/21 0956  BP: 139/84 (!) 142/72  Pulse: 82 83  Resp: 10 16  Temp:    SpO2: 97% 100%    Last Pain:  Vitals:   05/26/21 0935  TempSrc:   PainSc: 6                  Lynnae Ludemann C Pasquale Matters

## 2021-05-26 NOTE — Progress Notes (Signed)
Post op discharge instructions given through interpreter. Pt verbalizes understanding of anesthesia effects for the next 24 hrs as well as pain control and possible gas pain, pt encouraged to move around when at home. PT verbalizes understanding. Discharge instructions also given to spouse who also verbalizes understanding.

## 2021-05-29 ENCOUNTER — Telehealth: Payer: Self-pay | Admitting: *Deleted

## 2021-05-29 LAB — SURGICAL PATHOLOGY

## 2021-05-29 NOTE — Telephone Encounter (Signed)
Received call from patient son, Alexandra Brewer.   Reports that patient has not had BM x4 days. Advised to begin Miralax BID PRN, Colace 100mg  PO QD, Fleet Enema Q3 days PRN, and glycerin suppositories as needed.   Advised if patient unable to have BM in the next 24hrs, go back to ER for evaluation.   Verbalized understanding.

## 2021-05-30 ENCOUNTER — Encounter (HOSPITAL_COMMUNITY): Payer: Self-pay | Admitting: General Surgery

## 2021-06-01 ENCOUNTER — Ambulatory Visit (INDEPENDENT_AMBULATORY_CARE_PROVIDER_SITE_OTHER): Payer: Self-pay | Admitting: General Surgery

## 2021-06-01 ENCOUNTER — Encounter: Payer: Self-pay | Admitting: *Deleted

## 2021-06-01 ENCOUNTER — Encounter: Payer: Self-pay | Admitting: General Surgery

## 2021-06-01 ENCOUNTER — Other Ambulatory Visit: Payer: Self-pay

## 2021-06-01 VITALS — BP 137/73 | HR 79 | Temp 98.2°F | Resp 12 | Ht 60.0 in | Wt 154.0 lb

## 2021-06-01 DIAGNOSIS — Z09 Encounter for follow-up examination after completed treatment for conditions other than malignant neoplasm: Secondary | ICD-10-CM

## 2021-06-01 NOTE — Progress Notes (Signed)
Subjective:  ?  ? Alexandra Brewer  ?Patient here for postoperative visit, status post laparoscopic cholecystectomy.  Patient has been progressing well.  She has had a little bit of gas and incisional pain along with some bloating.  She states this is resolving.  Her appetite is slowly improving.  She denies any fever or chills.  An interpreter was present. ?Objective:  ? ? BP 137/73   Pulse 79   Temp 98.2 ?F (36.8 ?C) (Other (Comment))   Resp 12   Ht 5' (1.524 m)   Wt 154 lb (69.9 kg)   SpO2 97%   BMI 30.08 kg/m?  ? ?General:  alert, cooperative, and no distress  ?Abdomen is soft, incisions healing well. ?Final pathology reviewed. ?   ? ?Assessment:  ? ? Doing well postoperatively.  ?  ?Plan:  ? ?Patient may return to work without restriction on 06/12/2021.  Follow-up here as needed. ?

## 2021-06-02 NOTE — Patient Instructions (Addendum)
Allergic rhinitis with conjunctivitis (tree pollen and mold mix 2) ?-Continue Claritin (loratadine) once a day as needed for runny nose/itching. (Unable to get carbinoxamine due to cost per pharmacy. The pharmacy also does not carry Allegra. Has been on Zyrtec previously and was thought to be not effective.) ?-Continue Singulair 10 mg at night ?-Continue Atrovent 2 sprays each nostril twice a day (may use up to 3 times a day if needed for nasal drainage) ?-Continue olopatadine 0.2% eyedrops using 1 drop each eye once a day as needed for itchy watery eyes ? ?Mild persistent asthma ?-Continue Flovent 110 mcg 2 puffs twice a day with spacer to help prevent cough and wheeze ?-Continue Singulair 10 mg as above to help prevent cough and wheeze ?-May use Ventolin (albuterol) 2 puffs every 4 hours as needed for cough, wheeze, tightness in chest, shortness of breath.  Also, may use albuterol 2 puffs 5 to 15 minutes prior to exercise. ? ?Asthma control goals:  ?Full participation in all desired activities (may need albuterol before activity) ?Albuterol use two time or less a week on average (not counting use with activity) ?Cough interfering with sleep two time or less a month ?Oral steroids no more than once a year ?No hospitalizations ? ?Anaphylaxis due to food ?-Continue avoid fish and shellfish. In case of an allergic reaction, give Benadryl 4 teaspoonfuls every 4 hours, and if life-threatening symptoms occur, inject with EpiPen 0.3 mg. ? ?Urticaria (hives) ?-Continue Zyrtec (cetirizine) 10 mg once a day ?-Take a photo on your phone the next time you have these hives ?-We will get blood work to follow-up on this.  We will call you with results once they are all back. ? ?Please let us know if this treatment plan is not working well for you ?Schedule a follow-up appointment in 2 months or sooner if needed ? ?

## 2021-06-05 ENCOUNTER — Encounter: Payer: Self-pay | Admitting: Family

## 2021-06-05 ENCOUNTER — Other Ambulatory Visit: Payer: Self-pay

## 2021-06-05 ENCOUNTER — Ambulatory Visit (INDEPENDENT_AMBULATORY_CARE_PROVIDER_SITE_OTHER): Payer: No Typology Code available for payment source | Admitting: Family

## 2021-06-05 VITALS — BP 124/70 | HR 105 | Temp 98.5°F | Resp 16 | Ht 60.0 in | Wt 153.8 lb

## 2021-06-05 DIAGNOSIS — L508 Other urticaria: Secondary | ICD-10-CM

## 2021-06-05 DIAGNOSIS — H1013 Acute atopic conjunctivitis, bilateral: Secondary | ICD-10-CM

## 2021-06-05 DIAGNOSIS — J3089 Other allergic rhinitis: Secondary | ICD-10-CM

## 2021-06-05 DIAGNOSIS — J4530 Mild persistent asthma, uncomplicated: Secondary | ICD-10-CM

## 2021-06-05 DIAGNOSIS — T7800XD Anaphylactic reaction due to unspecified food, subsequent encounter: Secondary | ICD-10-CM

## 2021-06-05 DIAGNOSIS — R899 Unspecified abnormal finding in specimens from other organs, systems and tissues: Secondary | ICD-10-CM

## 2021-06-05 NOTE — Progress Notes (Signed)
? ?[place] [id number] ?Dept: [phone] ? ?FOLLOW UP NOTE ? ?Patient ID: Alexandra Brewer, female    DOB: March 16, 1956  Age: 66 y.o. MRN: [id number] ?Date of Office Visit: 06/05/2021 ? ?Assessment  ?Chief Complaint: Asthma (Much better), Other (Throat hurts recently.), and Allergic Rhinitis  (Claritin is not working for her and the zyrtec works a little better. If she stops taking the Zyrtec she gets itchy all over. Are there other alternative that may work better.) ? ?HPI ?Alexandra Brewer is a 66 year old female who presents today for follow-up of not well controlled mild persistent asthma, nonseasonal allergic rhinitis, allergic conjunctivitis, and allergy with anaphylaxis due to food.  She was last seen on March 06, 2021 by Althea Charon, FNP.  An interpreter is here with her today and helps provide history.  Since her last office visit she had laproscopic cholecystectomy on May 26, 2021. ? ?Allergic rhinitis with conjunctivitis is reported as moderately controlled with Zyrtec 10 mg once a day, Singulair 10 mg once a day, Atrovent nasal spray as needed, and olopatadine eyedrops as needed.  She reports clear rhinorrhea, nasal congestion, and postnasal drip at times when she uses her nasal spray it helps.  She has not had any sinus infections since we last saw her.  She also reports itchy watery eyes at times for which olopatadine eyedrops help. ? ?Mild persistent asthma is reported as moderately controlled with Flovent 110 mcg 2 puffs twice a day with spacer, Singulair 10 mg once a day and albuterol as needed.  She reports coughing and wheezing every now and then and sometimes tightness in her chest and shortness of breath.  She denies any nocturnal awakenings due to breathing problems.  Since her last office visit she has not required any systemic steroids or made any trips to the emergency room or urgent care due to breathing problems.  She has not had to use her albuterol  inhaler since we last saw her. ? ?She continues to avoid fish and shellfish without any accidental ingestion or use of her epinephrine autoinjector device.  She reports that her EpiPen is up-to-date. ? ?She reports for the past 10 years  she has had itching and reddish like hives that will occur at times.  It will start on her neck and be on her back.  She will take Zyrtec and it will go away.  She does not have any photos of this on her phone and does not have any rash or urticarial lesions today while in the office.  She is not able to relate this rash with any foods.  She denies any new medications, no new products, and is not sick when this occurs.  She denies any bug bites, fevers, or joint pain.  No one else in the household has this rash. ? ? ?Drug Allergies:  ?Allergies  ?Allergen Reactions  ? Fish Allergy   ?  Throat swell up  ? Naproxen Rash  ? Shellfish Allergy   ?  Throat swell up  ? Penicillins   ? ? ?Review of Systems: ?Review of Systems  ?Constitutional:  Negative for chills and fever.  ?HENT:    ?     Reports clear rhinorrhea and nasal congestion.  Also reports postnasal drip at times  ?Eyes:   ?     Reports itchy watery eyes at times  ?Respiratory:  Positive for cough, shortness of breath and wheezing.   ?     Reports coughing,  wheezing, tightness in chest, and shortness of breath every now and then.  ?Cardiovascular:  Negative for chest pain and palpitations.  ?Gastrointestinal:   ?     Reports reflux for which she takes pantoprazole  ?Genitourinary:  Negative for frequency.  ?Skin:  Positive for itching and rash.  ?Neurological:  Positive for headaches.  ?     Reports not too much headaches  ?Endo/Heme/Allergies:  Positive for environmental allergies.  ? ? ?Physical Exam: ?BP 124/70   Pulse (!) 105   Temp 98.5 ?F (36.9 ?C) (Temporal)   Resp 16   Ht 5' (1.524 m)   Wt 153 lb 12.8 oz (69.8 kg)   SpO2 99%   BMI 30.04 kg/m?   ? ?Physical Exam ?Exam conducted with a chaperone present.   ?Constitutional:   ?   Appearance: Normal appearance.  ?HENT:  ?   Head: Normocephalic and atraumatic.  ?   Comments: Pharynx normal, eyes normal, ears unable to see right tympanic membrane due to cerumen.  Left ear normal, nose bilateral lower turbinates mildly edematous and slightly erythematous with no drainage noted ?   Right Ear: Ear canal and external ear normal.  ?   Left Ear: Tympanic membrane, ear canal and external ear normal.  ?   Mouth/Throat:  ?   Mouth: Mucous membranes are moist.  ?   Pharynx: Oropharynx is clear.  ?Eyes:  ?   Conjunctiva/sclera: Conjunctivae normal.  ?Cardiovascular:  ?   Rate and Rhythm: Regular rhythm.  ?   Heart sounds: Normal heart sounds.  ?Pulmonary:  ?   Effort: Pulmonary effort is normal.  ?   Breath sounds: Normal breath sounds.  ?   Comments: Lungs clear to auscultation ?Musculoskeletal:  ?   Cervical back: Neck supple.  ?Skin: ?   General: Skin is warm.  ?Neurological:  ?   Mental Status: She is alert and oriented to person, place, and time.  ?Psychiatric:     ?   Mood and Affect: Mood normal.     ?   Behavior: Behavior normal.     ?   Thought Content: Thought content normal.     ?   Judgment: Judgment normal.  ? ? ?Diagnostics: ?We will get spirometry at your next office visit due to your recently having your gallbladder removed. ? ?Assessment and Plan: ?1. Mild persistent asthma, uncomplicated   ?2. Non-seasonal allergic rhinitis due to other allergic trigger   ?3. Allergic conjunctivitis of both eyes   ?4. Allergy with anaphylaxis due to food, subsequent encounter   ?5. Chronic urticaria   ? ? ?No orders of the defined types were placed in this encounter. ? ? ?Patient Instructions  ?Allergic rhinitis with conjunctivitis (tree pollen and mold mix 2) ?-Continue Claritin (loratadine) once a day as needed for runny nose/itching. (Unable to get carbinoxamine due to cost per pharmacy. The pharmacy also does not carry Allegra. Has been on Zyrtec previously and was thought to  be not effective.) ?-Continue Singulair 10 mg at night ?-Continue Atrovent 2 sprays each nostril twice a day (may use up to 3 times a day if needed for nasal drainage) ?-Continue olopatadine 0.2% eyedrops using 1 drop each eye once a day as needed for itchy watery eyes ? ?Mild persistent asthma ?-Continue Flovent 110 mcg 2 puffs twice a day with spacer to help prevent cough and wheeze ?-Continue Singulair 10 mg as above to help prevent cough and wheeze ?-May use Ventolin (albuterol) 2 puffs every 4  hours as needed for cough, wheeze, tightness in chest, shortness of breath.  Also, may use albuterol 2 puffs 5 to 15 minutes prior to exercise. ? ?Asthma control goals:  ?Full participation in all desired activities (may need albuterol before activity) ?Albuterol use two time or less a week on average (not counting use with activity) ?Cough interfering with sleep two time or less a month ?Oral steroids no more than once a year ?No hospitalizations ? ?Anaphylaxis due to food ?-Continue avoid fish and shellfish. In case of an allergic reaction, give Benadryl 4 teaspoonfuls every 4 hours, and if life-threatening symptoms occur, inject with EpiPen 0.3 mg. ? ?Urticaria (hives) ?-Continue Zyrtec (cetirizine) 10 mg once a day ?-Take a photo on your phone the next time you have these hives ?-We will get blood work to follow-up on this.  We will call you with results once they are all back. ? ?Please let us know if this treatment plan is not working well for you ?Schedule a follow-up appointment in 2 months or sooner if needed ? ? ?Return in about 2 months (around 08/05/2021). ?  ? ?Thank you for the opportunity to care for this patient.  Please do not hesitate to contact me with questions. ? ?Althea Charon, FNP ?[facility] ? ? ? ? ?

## 2021-06-08 LAB — COMPREHENSIVE METABOLIC PANEL
ALT: 18 IU/L (ref 0–32)
AST: 19 IU/L (ref 0–40)
Albumin/Globulin Ratio: 1.5 (ref 1.2–2.2)
Albumin: 5 g/dL — ABNORMAL HIGH (ref 3.8–4.8)
Alkaline Phosphatase: 137 IU/L — ABNORMAL HIGH (ref 44–121)
BUN/Creatinine Ratio: 11 — ABNORMAL LOW (ref 12–28)
BUN: 9 mg/dL (ref 8–27)
Bilirubin Total: 0.3 mg/dL (ref 0.0–1.2)
CO2: 25 mmol/L (ref 20–29)
Calcium: 9.8 mg/dL (ref 8.7–10.3)
Chloride: 101 mmol/L (ref 96–106)
Creatinine, Ser: 0.83 mg/dL (ref 0.57–1.00)
Globulin, Total: 3.3 g/dL (ref 1.5–4.5)
Glucose: 91 mg/dL (ref 70–99)
Potassium: 4.6 mmol/L (ref 3.5–5.2)
Sodium: 141 mmol/L (ref 134–144)
Total Protein: 8.3 g/dL (ref 6.0–8.5)
eGFR: 79 mL/min/{1.73_m2} (ref >59)

## 2021-06-08 LAB — THYROID PEROXIDASE ANTIBODY: Thyroperoxidase Ab SerPl-aCnc: 72 IU/mL — ABNORMAL HIGH (ref 0–34)

## 2021-06-08 LAB — CBC WITH DIFFERENTIAL
Basophils Absolute: 0 10*3/uL (ref 0.0–0.2)
Basos: 1 %
EOS (ABSOLUTE): 0.1 10*3/uL (ref 0.0–0.4)
Eos: 1 %
Hematocrit: 42.3 % (ref 34.0–46.6)
Hemoglobin: 13.8 g/dL (ref 11.1–15.9)
Immature Grans (Abs): 0 10*3/uL (ref 0.0–0.1)
Immature Granulocytes: 0 %
Lymphocytes Absolute: 1.8 10*3/uL (ref 0.7–3.1)
Lymphs: 23 %
MCH: 28.3 pg (ref 26.6–33.0)
MCHC: 32.6 g/dL (ref 31.5–35.7)
MCV: 87 fL (ref 79–97)
Monocytes Absolute: 0.8 10*3/uL (ref 0.1–0.9)
Monocytes: 11 %
Neutrophils Absolute: 5.1 10*3/uL (ref 1.4–7.0)
Neutrophils: 64 %
RBC: 4.88 x10E6/uL (ref 3.77–5.28)
RDW: 13.1 % (ref 11.7–15.4)
WBC: 7.9 10*3/uL (ref 3.4–10.8)

## 2021-06-08 LAB — ALPHA-GAL PANEL
Allergen Lamb IgE: <0.1 kU/L
Beef IgE: <0.1 kU/L
IgE (Immunoglobulin E), Serum: 810 IU/mL — ABNORMAL HIGH (ref 6–495)
O215-IgE Alpha-Gal: 0.2 kU/L — AB
Pork IgE: <0.1 kU/L

## 2021-06-08 LAB — C4 COMPLEMENT: Complement C4, Serum: 39 mg/dL — ABNORMAL HIGH (ref 12–38)

## 2021-06-08 LAB — THYROGLOBULIN ANTIBODY: Thyroglobulin Antibody: 561.4 IU/mL — ABNORMAL HIGH (ref 0.0–0.9)

## 2021-06-08 LAB — TRYPTASE: Tryptase: 2.8 ug/L (ref 2.2–13.2)

## 2021-06-15 ENCOUNTER — Telehealth: Payer: Self-pay

## 2021-06-15 NOTE — Telephone Encounter (Signed)
called pt to move appt to another date [phone] ?

## 2021-06-16 NOTE — Progress Notes (Signed)
Will need a Spanish interpreter. ? ?Please let Alexandra Brewer know that her alkaline phosphatase is elevated. We will need to get lab work to follow up on this. I will put an order in for GGT, along with an ANA with reflex. ? ?Chronic urticaria panel was not completed for some reason. I will put another order in. ? ?Alpha gal panel was slightly elevated. Start avoiding all red meat such as beef, pork, lamb and venison. We will send in a prescription for epinephrine auto injector device 0.3 mg to use in case of an anaphylactic reaction. She can either come by our office or have the pharmacy give a demonstration. Please fill out an an emergency action plan to avoid all red meat. Also please send in the prescription for epinephrine 0.3 mg. ? ?Her thyroid peroxidase antibody and thyroglobulin antibody were elevated.  She does have a diagnosis of hypothyroidism and is currently on levothyroxine. ? ?Her complete blood count was normal. ? ?Serum tryptase: Normal ruling out mast cell disorders ? ?C4: normal ? ?Althea Charon, FNP

## 2021-06-16 NOTE — Addendum Note (Signed)
Addended by: Althea Charon on: 06/16/2021 01:30 PM ? ? Modules accepted: Orders ? ?

## 2021-06-21 ENCOUNTER — Encounter: Payer: Self-pay | Admitting: Gastroenterology

## 2021-06-23 ENCOUNTER — Other Ambulatory Visit: Payer: Self-pay

## 2021-07-03 ENCOUNTER — Encounter: Payer: Self-pay | Admitting: *Deleted

## 2021-07-17 ENCOUNTER — Ambulatory Visit: Payer: No Typology Code available for payment source | Admitting: Internal Medicine

## 2021-07-24 ENCOUNTER — Other Ambulatory Visit: Payer: Self-pay

## 2021-07-24 ENCOUNTER — Other Ambulatory Visit: Payer: Self-pay | Admitting: Allergy

## 2021-07-25 ENCOUNTER — Ambulatory Visit: Payer: No Typology Code available for payment source | Admitting: Internal Medicine

## 2021-08-04 NOTE — Patient Instructions (Addendum)
Allergic rhinitis with conjunctivitis (tree pollen and mold mix 2) ?-Continue Zyrtec (cetirizine) once a day as needed for runny nose/itching. (Unable to get carbinoxamine due to cost per pharmacy. The pharmacy also does not carry Allegra. Has been on Zyrtec previously and was thought to be not effective.) ?-Continue Singulair 10 mg at night ?-Continue Atrovent 2 sprays each nostril twice a day (may use up to 3 times a day if needed for nasal drainage) ?-Continue olopatadine 0.2% eyedrops using 1 drop each eye once a day as needed for itchy watery eyes ? ?Mild persistent asthma ?-Increase Flovent 110 mcg 2 puffs twice a day with spacer to help prevent cough and wheeze.Rinse mouth out afterwards  ?-Continue Singulair 10 mg as above to help prevent cough and wheeze ?-May use Ventolin (albuterol) 2 puffs every 4 hours as needed for cough, wheeze, tightness in chest, shortness of breath.  Also, may use albuterol 2 puffs 5 to 15 minutes prior to exercise. ? ?Asthma control goals:  ?Full participation in all desired activities (may need albuterol before activity) ?Albuterol use two time or less a week on average (not counting use with activity) ?Cough interfering with sleep two time or less a month ?Oral steroids no more than once a year ?No hospitalizations ? ?Anaphylaxis due to food ?-Continue avoid fish and shellfish. In case of an allergic reaction, give Benadryl 4 teaspoonfuls every 4 hours, and if life-threatening symptoms occur, inject with EpiPen 0.3 mg. ? ?Urticaria (hives) ?-Continue Zyrtec (cetirizine) 10 mg once a day. May increase to twice a day to help with the itching. Caution as this can make you sleepy ?-We will get additional blood work to follow-up on this.  We will call you with results once they are all back. ? ?Please let us know if this treatment plan is not working well for you ?Schedule a follow-up appointment in 3 months or sooner if needed ? ?

## 2021-08-07 ENCOUNTER — Encounter: Payer: Self-pay | Admitting: Family

## 2021-08-07 ENCOUNTER — Other Ambulatory Visit: Payer: Self-pay

## 2021-08-07 ENCOUNTER — Ambulatory Visit (INDEPENDENT_AMBULATORY_CARE_PROVIDER_SITE_OTHER): Payer: No Typology Code available for payment source | Admitting: Family

## 2021-08-07 VITALS — BP 148/80 | HR 83 | Temp 98.2°F | Resp 16 | Ht 60.0 in | Wt 154.4 lb

## 2021-08-07 DIAGNOSIS — J3089 Other allergic rhinitis: Secondary | ICD-10-CM

## 2021-08-07 DIAGNOSIS — L508 Other urticaria: Secondary | ICD-10-CM

## 2021-08-07 DIAGNOSIS — T7800XD Anaphylactic reaction due to unspecified food, subsequent encounter: Secondary | ICD-10-CM

## 2021-08-07 DIAGNOSIS — J4530 Mild persistent asthma, uncomplicated: Secondary | ICD-10-CM

## 2021-08-07 DIAGNOSIS — H1013 Acute atopic conjunctivitis, bilateral: Secondary | ICD-10-CM

## 2021-08-07 MED ORDER — CETIRIZINE HCL 10 MG PO CHEW
10.0000 mg | CHEWABLE_TABLET | Freq: Every day | ORAL | 5 refills | Status: DC
  Filled 2021-08-07: qty 30, 30d supply, fill #0

## 2021-08-07 MED ORDER — IPRATROPIUM BROMIDE 0.06 % NA SOLN
2.0000 | Freq: Two times a day (BID) | NASAL | 5 refills | Status: DC
  Filled 2021-08-07: qty 15, 38d supply, fill #0
  Filled 2021-11-24: qty 15, 21d supply, fill #1
  Filled 2022-01-05: qty 15, 21d supply, fill #2
  Filled 2022-04-30: qty 15, 21d supply, fill #3

## 2021-08-07 MED ORDER — EPINEPHRINE 0.3 MG/0.3ML IJ SOAJ
0.3000 mg | INTRAMUSCULAR | 1 refills | Status: DC | PRN
  Filled 2021-08-07: qty 2, fill #0

## 2021-08-07 NOTE — Progress Notes (Signed)
? ?[name] [id number] ?Dept: [phone] ? ?FOLLOW UP NOTE ? ?Patient ID: Alexandra Brewer, female    DOB: 1955-05-08  Age: 66 y.o. MRN: [id number] ?Date of Office Visit: 08/07/2021 ? ?Assessment  ?Chief Complaint: Asthma (Good), Allergic Rhinitis  (Been bothering her lately. Claritin is not working for her so she is taking Cetirizine.), and Other (Would like to go over past lab results. Letter was sent to her in [place] and she could not understand it./Hives on right hand and upper back/ shoulder area. They are itchy and the patient wants to know if this is from allergies or something else so that she can let her PCP know.) ? ?HPI ?Alexandra Brewer is a 66 year old female who presents today for follow-up of mild persistent asthma, nonseasonal allergic rhinitis, allergic conjunctivitis, allergy with anaphylaxis due to food, and chronic urticaria.  She was last seen on June 05, 2021 by myself.  Since her last office visit she denies any new diagnosis or surgeries.  A translator is here with her today and helps provide history. ? ?Mild persistent asthma is reported as moderately controlled with Flovent 110 mcg 1 puff twice a day with spacer, Singulair 10 mg once a day, and albuterol 1 puff in the morning.  She reports if she uses the albuterol inhaler at night it causes her to be shaky.  She reports little bit of shortness of breath if outside or the weather changes.  She denies coughing, wheezing, tightness in chest, and nocturnal awakenings due to breathing problems.  Since her last office visit she has not required any systemic steroids or made any trips to the emergency room or urgent care due to breathing problems.  Discussed how albuterol was to be used only as needed rather than daily.  Discussed how I wanted her to increase her Flovent to 2 puffs twice a day with a spacer so this would decrease the need for albuterol.  She verbalizes understanding. ? ?Allergic rhinitis is reported  as moderately controlled with Zyrtec 10 mg once a day, Singulair 10 mg at night, Atrovent nasal spray, and olopatadine eyedrops.  She reports rhinorrhea and nasal congestion for which her nasal spray will help.  She also reports irritation in her throat for which Singulair helps.  She denies postnasal drip.  She has not had any sinus infections since we last saw her.  She is no longer taking Claritin because she feels like it does not work well. ? ?She continues to avoid fish and shellfish without any accidental ingestion or use of her epinephrine autoinjector device.  She is not certain if her EpiPen is up-to-date. ? ?She continues to have hives while on Zyrtec 10 mg once a day.  She did not get the additional lab work that was ordered. ? ? ?Drug Allergies:  ?Allergies  ?Allergen Reactions  ? Fish Allergy   ?  Throat swell up  ? Naproxen Rash  ? Shellfish Allergy   ?  Throat swell up  ? Penicillins   ? ? ?Review of Systems: ?Review of Systems  ?Constitutional:  Positive for chills. Negative for fever.  ?     She reports chills her primary care says is due to her thyroid  ?HENT:    ?     Reports a little bit of rhinorrhea and nasal congestion for which her nose spray helps.  Reports little bit of irritation in his throat for which Singulair helps.  ?Eyes:   ?  Reports itchy watery eyes for which her eyedrops help  ?Respiratory:  Positive for shortness of breath. Negative for cough and wheezing.   ?     Reports shortness of breath when she is outside.  Denies wheezing, tightness in chest, cough, and nocturnal awakenings breathing problems  ?Cardiovascular:  Negative for chest pain and palpitations.  ?Gastrointestinal:   ?     Denies heartburn or reflux symptoms  ?Genitourinary:  Negative for frequency.  ?Skin:  Positive for itching and rash.  ?     Reports itchy rash on bilateral hands forearms and back  ?Neurological:  Positive for headaches.  ?Endo/Heme/Allergies:  Positive for environmental allergies.   ? ? ?Physical Exam: ?BP (!) 148/80   Pulse 83   Temp 98.2 ?F (36.8 ?C) (Temporal)   Resp 16   Ht 5' (1.524 m)   Wt 154 lb 6.4 oz (70 kg)   SpO2 98%   BMI 30.15 kg/m?   ? ?Physical Exam ?Exam conducted with a chaperone present.  ?Constitutional:   ?   Appearance: Normal appearance.  ?HENT:  ?   Head: Normocephalic and atraumatic.  ?   Comments: Pharynx normal. Eyes norma. Ears: unable to see right tympanic membrane due to cerumen. Left ear normal. Nose: bilateral lower turbinates mildly edematous and slightly erythematous with no drainage noted ?   Right Ear: Ear canal and external ear normal.  ?   Left Ear: Tympanic membrane, ear canal and external ear normal.  ?   Mouth/Throat:  ?   Mouth: Mucous membranes are moist.  ?   Pharynx: Oropharynx is clear.  ?Eyes:  ?   Conjunctiva/sclera: Conjunctivae normal.  ?Cardiovascular:  ?   Rate and Rhythm: Normal rate and regular rhythm.  ?   Heart sounds: Normal heart sounds.  ?Pulmonary:  ?   Effort: Pulmonary effort is normal.  ?   Breath sounds: Normal breath sounds.  ?   Comments: Lungs clear to auscultation ?Musculoskeletal:  ?   Cervical back: Neck supple.  ?Skin: ?   General: Skin is warm.  ?   Comments: Small flesh colored papules with scabbing noted on bilateral hands and forearms. No oozing or bleeding noted. She also reports areas on her back.  ?Neurological:  ?   Mental Status: She is alert and oriented to person, place, and time.  ?Psychiatric:     ?   Mood and Affect: Mood normal.     ?   Behavior: Behavior normal.     ?   Thought Content: Thought content normal.     ?   Judgment: Judgment normal.  ? ? ?Diagnostics: ? None. Will get at next office visit. ? ?Assessment and Plan: ?1. Mild persistent asthma, uncomplicated   ?2. Non-seasonal allergic rhinitis due to other allergic trigger   ?3. Allergy with anaphylaxis due to food, subsequent encounter   ?4. Allergic conjunctivitis of both eyes   ?5. Chronic urticaria   ? ? ?Meds ordered this encounter   ?Medications  ? ipratropium (ATROVENT) 0.06 % nasal spray  ?  Sig: Place 2 sprays into both nostrils in the morning and at bedtime.  ?  Dispense:  15 mL  ?  Refill:  5  ? cetirizine (ZYRTEC) 10 MG chewable tablet  ?  Sig: Chew 1 tablet (10 mg total) by mouth daily.  ?  Dispense:  30 tablet  ?  Refill:  5  ? EPINEPHrine 0.3 mg/0.3 mL IJ SOAJ injection  ?  Sig: Inject  0.3 mg into the muscle as needed for anaphylaxis.  ?  Dispense:  2 each  ?  Refill:  1  ? ? ?Patient Instructions  ?Allergic rhinitis with conjunctivitis (tree pollen and mold mix 2) ?-Continue Zyrtec (cetirizine) once a day as needed for runny nose/itching. (Unable to get carbinoxamine due to cost per pharmacy. The pharmacy also does not carry Allegra. Has been on Zyrtec previously and was thought to be not effective.) ?-Continue Singulair 10 mg at night ?-Continue Atrovent 2 sprays each nostril twice a day (may use up to 3 times a day if needed for nasal drainage) ?-Continue olopatadine 0.2% eyedrops using 1 drop each eye once a day as needed for itchy watery eyes ? ?Mild persistent asthma ?-Increase Flovent 110 mcg 2 puffs twice a day with spacer to help prevent cough and wheeze.Rinse mouth out afterwards  ?-Continue Singulair 10 mg as above to help prevent cough and wheeze ?-May use Ventolin (albuterol) 2 puffs every 4 hours as needed for cough, wheeze, tightness in chest, shortness of breath.  Also, may use albuterol 2 puffs 5 to 15 minutes prior to exercise. ? ?Asthma control goals:  ?Full participation in all desired activities (may need albuterol before activity) ?Albuterol use two time or less a week on average (not counting use with activity) ?Cough interfering with sleep two time or less a month ?Oral steroids no more than once a year ?No hospitalizations ? ?Anaphylaxis due to food ?-Continue avoid fish and shellfish. In case of an allergic reaction, give Benadryl 4 teaspoonfuls every 4 hours, and if life-threatening symptoms occur, inject  with EpiPen 0.3 mg. ? ?Urticaria (hives) ?-Continue Zyrtec (cetirizine) 10 mg once a day. May increase to twice a day to help with the itching. Caution as this can make you sleepy ?-We will get additional blood work to [facility]

## 2021-08-08 ENCOUNTER — Other Ambulatory Visit: Payer: Self-pay

## 2021-08-25 ENCOUNTER — Other Ambulatory Visit: Payer: Self-pay

## 2021-08-29 ENCOUNTER — Ambulatory Visit: Payer: Self-pay | Admitting: Gastroenterology

## 2021-08-30 ENCOUNTER — Other Ambulatory Visit: Payer: Self-pay

## 2021-08-30 ENCOUNTER — Encounter (HOSPITAL_COMMUNITY): Payer: Self-pay | Admitting: Emergency Medicine

## 2021-08-30 ENCOUNTER — Ambulatory Visit (HOSPITAL_COMMUNITY)
Admission: EM | Admit: 2021-08-30 | Discharge: 2021-08-30 | Disposition: A | Discharge status: Home / Self Care | Payer: No Typology Code available for payment source | Attending: Family Medicine | Admitting: Family Medicine

## 2021-08-30 DIAGNOSIS — R509 Fever, unspecified: Secondary | ICD-10-CM | POA: Insufficient documentation

## 2021-08-30 DIAGNOSIS — N3001 Acute cystitis with hematuria: Secondary | ICD-10-CM | POA: Insufficient documentation

## 2021-08-30 LAB — POCT URINALYSIS DIPSTICK, ED / UC
Bilirubin Urine: NEGATIVE
Glucose, UA: NEGATIVE mg/dL
Ketones, ur: NEGATIVE mg/dL
Nitrite: NEGATIVE
Protein, ur: 100 mg/dL — AB
Specific Gravity, Urine: 1.02 (ref 1.005–1.030)
Urobilinogen, UA: 0.2 mg/dL (ref 0.0–1.0)
pH: 5.5 (ref 5.0–8.0)

## 2021-08-30 MED ORDER — NITROFURANTOIN MONOHYD MACRO 100 MG PO CAPS
100.0000 mg | ORAL_CAPSULE | Freq: Two times a day (BID) | ORAL | 0 refills | Status: DC
  Filled 2021-08-30: qty 10, 5d supply, fill #0

## 2021-08-30 MED ORDER — PHENAZOPYRIDINE HCL 200 MG PO TABS
200.0000 mg | ORAL_TABLET | Freq: Three times a day (TID) | ORAL | 0 refills | Status: DC
  Filled 2021-08-30: qty 6, 2d supply, fill #0

## 2021-08-30 NOTE — ED Provider Notes (Signed)
[place]    CSN: [id number] Arrival date & time: 08/30/21  7494      History   Chief Complaint Chief Complaint  Patient presents with   Urinary Tract Infection    HPI Alexandra Brewer is a 66 y.o. female.   66 year old female presents with dysuria.  Patient relates through the interpreter service that she has been having 2 weeks of progressive frequency, urgency, and dysuria.  Patient relates she is going to the bathroom about every 5 minutes.  Patient relates it does burn when she goes to the bathroom, she has not seen any blood in the urine.  Patient relates she has had mild fever, no back pain, no nausea or vomiting.  Patient is drinking fluids well.   Urinary Tract Infection  Past Medical History:  Diagnosis Date   Asthma    Constipation    Hyperlipemia    Hypertension    Hypothyroidism    Sciatica    Thyroid disease    Urticaria     Patient Active Problem List   Diagnosis Date Noted   Abdominal pain, epigastric 05/16/2021   Esophageal dysphagia 05/16/2021   Calculus of gallbladder without cholecystitis without obstruction 01/27/2021   Fatty liver 01/27/2021   Class 1 obesity due to excess calories with serious comorbidity and body mass index (BMI) of 31.0 to 31.9 in adult 01/27/2021   Long term (current) use of non-steroidal anti-inflammatories (nsaid) 01/12/2021   Migraine without aura and without status migrainosus, not intractable 05/31/2020   Abdominal pain 03/10/2018   Sciatica of right side 06/11/2017   RUQ abdominal pain 01/23/2017   GERD (gastroesophageal reflux disease) 11/26/2016   Right Achilles tendinitis 08/15/2016   Hypertension 07/05/2016   Adhesive capsulitis of right shoulder 02/21/2016   Heel pain, bilateral 01/10/2016   Varicose veins of left lower extremity with pain 01/10/2016   History of colonic polyps    Diverticulosis of colon without hemorrhage    Hypothyroidism 08/10/2015   Chronic constipation 08/05/2015    Hyperlipidemia 08/05/2015    Past Surgical History:  Procedure Laterality Date   BREAST BIOPSY Left 2018   FIBROCYSTIC CHANGES WITH FEATURES OF RUPTURE   CATARACT EXTRACTION     CHOLECYSTECTOMY N/A 05/26/2021   Procedure: LAPAROSCOPIC CHOLECYSTECTOMY;  Surgeon: Aviva Signs, MD;  Location: [facility];  Service: General;  Laterality: N/A;   COLONOSCOPY N/A 10/10/2015   Procedure: COLONOSCOPY;  Surgeon: Daneil Dolin, MD;  Location: [facility];  Service: Endoscopy;  Laterality: N/A;  245 - Interpreter scheduled, do NOT move   GALLBLADDER SURGERY  05/26/2021    OB History     Gravida  2   Para  1   Term  1   Preterm      AB  1   Living         SAB  1   IAB      Ectopic      Multiple      Live Births  1            Home Medications    Prior to Admission medications   Medication Sig Start Date End Date Taking? Authorizing Provider  nitrofurantoin, macrocrystal-monohydrate, (MACROBID) 100 MG capsule Take 1 capsule (100 mg total) by mouth 2 (two) times daily. 08/30/21  Yes Nyoka Lint, PA-C  phenazopyridine (PYRIDIUM) 200 MG tablet Take 1 tablet (200 mg total) by mouth 3 (three) times daily. 08/30/21  Yes Nyoka Lint, PA-C  albuterol (VENTOLIN HFA) 108 (  90 Base) MCG/ACT inhaler Inhale 2 puffs into the lungs every 6 (six) hours as needed for wheezing or shortness of breath.    [provider]  amLODipine (NORVASC) 5 MG tablet Take 1 tablet (5 mg total) by mouth daily. 03/13/21   Ladell Pier, MD  cetirizine (ZYRTEC) 10 MG chewable tablet Chew 1 tablet (10 mg total) by mouth daily. 08/07/21   Althea Charon, FNP  diclofenac Sodium (VOLTAREN) 1 % GEL Apply 4 g topically 4 (four) times daily.    [provider]  EPINEPHrine 0.3 mg/0.3 mL IJ SOAJ injection Inject 0.3 mg into the muscle as needed for anaphylaxis. 08/07/21   Althea Charon, FNP  fluticasone (FLOVENT HFA) 110 MCG/ACT inhaler USE 2 PUFFS TWICE A DAY WITH SPACER TO HELP PREVENT COUGH AND  WHEEZE 05/12/21 05/12/22  Althea Charon, FNP  ipratropium (ATROVENT) 0.06 % nasal spray Place 2 sprays into both nostrils in the morning and at bedtime. 08/07/21   Althea Charon, FNP  levothyroxine (SYNTHROID) 112 MCG tablet TAKE 1 TABLET (112 MCG TOTAL) BY MOUTH DAILY. 03/13/21 03/13/22  Ladell Pier, MD  linaclotide [facility]) 72 MCG capsule Take 1 capsule (72 mcg total) by mouth daily before breakfast. 05/16/21   Mahala Menghini, PA-C  meloxicam (MOBIC) 15 MG tablet TAKE 1 TABLET (15 MG TOTAL) BY MOUTH DAILY. 09/13/20 09/29/21  Pete Pelt, PA-C  montelukast (SINGULAIR) 10 MG tablet TAKE 1 TABLET (10 MG TOTAL) BY MOUTH AT BEDTIME. 03/06/21 03/06/22  Althea Charon, FNP  Olopatadine HCl 0.2 % SOLN Place 1 drop in each eye once a day as needed for itchy watery eyes 12/19/20   Althea Charon, FNP  pantoprazole (PROTONIX) 40 MG tablet Take 1 tablet (40 mg total) by mouth 2 (two) times daily before a meal. 05/16/21   Mahala Menghini, PA-C  traMADol (ULTRAM) 50 MG tablet Take 1 tablet (50 mg total) by mouth every 6 (six) hours as needed. Patient not taking: Reported on 08/07/2021 05/26/21   Aviva Signs, MD  Wheat Dextrin [facility]) POWD Take by mouth daily. Two tablespoons    [provider]  fluticasone (FLONASE) 50 MCG/ACT nasal spray Place 2 sprays into both nostrils daily as needed for allergies or rhinitis. 07/06/19 03/17/20  Ladell Pier, MD    Family History Family History  Problem Relation Age of Onset   Allergic rhinitis Mother    Diabetes Father    Hypertension Father    Asthma Father    Stroke Maternal Grandmother    Stroke Maternal Grandfather    Colon cancer Neg Hx    Angioedema Neg Hx    Atopy Neg Hx    Immunodeficiency Neg Hx    Urticaria Neg Hx    Breast cancer Neg Hx     Social History Social History   Tobacco Use   Smoking status: Never   Smokeless tobacco: Never  Vaping Use   Vaping Use: Never used  Substance Use Topics   Alcohol use: No     Alcohol/week: 0.0 standard drinks   Drug use: No     Allergies   Fish allergy, Naproxen, Shellfish allergy, and Penicillins   Review of Systems Review of Systems  Genitourinary:  Positive for dysuria, frequency and urgency.    Physical Exam Triage Vital Signs ED Triage Vitals  Enc Vitals Group     BP 08/30/21 1052 127/78     Pulse Rate 08/30/21 1052 77     Resp 08/30/21 1052 18  Temp 08/30/21 1052 99 F (37.2 C)     Temp Source 08/30/21 1052 Oral     SpO2 08/30/21 1052 96 %     Weight --      Height --      Head Circumference --      Peak Flow --      Pain Score 08/30/21 1047 6     Pain Loc --      Pain Edu? --      Excl. in [place]? --    No data found.  Updated Vital Signs BP 127/78 (BP Location: Left Arm)   Pulse 77   Temp 99 F (37.2 C) (Oral)   Resp 18   SpO2 96%   Visual Acuity Right Eye Distance:   Left Eye Distance:   Bilateral Distance:    Right Eye Near:   Left Eye Near:    Bilateral Near:     Physical Exam Constitutional:      Appearance: Normal appearance.  Abdominal:     General: Abdomen is flat. Bowel sounds are normal.     Palpations: Abdomen is soft.     Tenderness: There is no abdominal tenderness.     Comments: Pelvis: There is mild suprapubic tenderness on palpation.  No masses.  Neurological:     Mental Status: She is alert.     UC Treatments / Results  Labs (all labs ordered are listed, but only abnormal results are displayed) Labs Reviewed  POCT URINALYSIS DIPSTICK, ED / UC - Abnormal; Notable for the following components:      Result Value   Hgb urine dipstick MODERATE (*)    Protein, ur 100 (*)    Leukocytes,Ua MODERATE (*)    All other components within normal limits  URINE CULTURE    EKG   Radiology No results found.  Procedures Procedures (including critical care time)  Medications Ordered in UC Medications - No data to display  Initial Impression / Assessment and Plan / UC Course  I have reviewed the  triage vital signs and the nursing notes.  Pertinent labs & imaging results that were available during my care of the patient were reviewed by me and considered in my medical decision making (see chart for details).    Plan: 1.  Advised patient to take the Macrobid 1 every 12 hours to treat the acute cystitis. 2.  Advised patient to take Pyridium 1 every 6 hours to treat the dysuria. 3.  Urine culture is pending 4.  Advised the patient to follow-up with PCP or return to urgent care symptoms fail to improve over the next 5 to 7 days. Final Clinical Impressions(s) / UC Diagnoses   Final diagnoses:  Acute cystitis with hematuria  Fever, unspecified fever cause     Discharge Instructions      Advised to increase fluid intake over the next couple days drinking plenty of fluids water frequently to help flush the bacteria. Advised to take the Macrobid 1 capsule every 12 hours until completed to treat the infection. Advised to take the Pyridium 1 every 6 hours as needed to give relief from the burning, this will turn your urine a different color. Advised to follow-up with PCP or return to urgent care if symptoms fail to improve within the next 5 to 7 days    ED Prescriptions     Medication Sig Dispense Auth. Provider   nitrofurantoin, macrocrystal-monohydrate, (MACROBID) 100 MG capsule Take 1 capsule (100 mg total) by mouth 2 (  two) times daily. 10 capsule Nyoka Lint, PA-C   phenazopyridine (PYRIDIUM) 200 MG tablet Take 1 tablet (200 mg total) by mouth 3 (three) times daily. 6 tablet Nyoka Lint, PA-C      PDMP not reviewed this encounter.   Nyoka Lint, PA-C 08/30/21 1141

## 2021-08-30 NOTE — Discharge Instructions (Signed)
Advised to increase fluid intake over the next couple days drinking plenty of fluids water frequently to help flush the bacteria. Advised to take the Macrobid 1 capsule every 12 hours until completed to treat the infection. Advised to take the Pyridium 1 every 6 hours as needed to give relief from the burning, this will turn your urine a different color. Advised to follow-up with PCP or return to urgent care if symptoms fail to improve within the next 5 to 7 days

## 2021-08-30 NOTE — ED Triage Notes (Signed)
2 weeks ago started having issues with urination.  Yesterday started going more frequently, burning.  Pain in abdomen, no back pain

## 2021-08-31 LAB — URINE CULTURE

## 2021-09-05 ENCOUNTER — Ambulatory Visit: Payer: No Typology Code available for payment source | Admitting: Critical Care Medicine

## 2021-09-14 ENCOUNTER — Ambulatory Visit: Payer: No Typology Code available for payment source | Attending: Internal Medicine | Admitting: Internal Medicine

## 2021-09-14 ENCOUNTER — Other Ambulatory Visit: Payer: Self-pay

## 2021-09-14 ENCOUNTER — Encounter: Payer: Self-pay | Admitting: Internal Medicine

## 2021-09-14 VITALS — BP 120/70 | HR 71 | Temp 98.7°F | Ht 60.0 in | Wt 153.2 lb

## 2021-09-14 DIAGNOSIS — I10 Essential (primary) hypertension: Secondary | ICD-10-CM

## 2021-09-14 DIAGNOSIS — L309 Dermatitis, unspecified: Secondary | ICD-10-CM

## 2021-09-14 DIAGNOSIS — E039 Hypothyroidism, unspecified: Secondary | ICD-10-CM

## 2021-09-14 MED ORDER — AMLODIPINE BESYLATE 5 MG PO TABS
5.0000 mg | ORAL_TABLET | Freq: Every day | ORAL | 2 refills | Status: DC
  Filled 2021-09-14 – 2021-10-30 (×2): qty 90, 90d supply, fill #0

## 2021-09-14 MED ORDER — TRIAMCINOLONE ACETONIDE 0.1 % EX CREA
1.0000 | TOPICAL_CREAM | Freq: Two times a day (BID) | CUTANEOUS | 0 refills | Status: DC
  Filled 2021-09-14: qty 30, 15d supply, fill #0

## 2021-09-14 MED ORDER — LEVOTHYROXINE SODIUM 112 MCG PO TABS
ORAL_TABLET | Freq: Every day | ORAL | 2 refills | Status: DC
Start: 2021-09-14 — End: 2022-09-05
  Filled 2021-09-14: qty 90, fill #0
  Filled 2021-09-27: qty 30, 30d supply, fill #0
  Filled 2021-10-30: qty 30, 30d supply, fill #1
  Filled 2021-11-29: qty 30, 30d supply, fill #2
  Filled 2022-01-05: qty 30, 30d supply, fill #3
  Filled 2022-02-05: qty 30, 30d supply, fill #4
  Filled 2022-03-05: qty 30, 30d supply, fill #5
  Filled 2022-04-30: qty 30, 30d supply, fill #6
  Filled 2022-06-25 (×2): qty 30, 30d supply, fill #7
  Filled 2022-08-06 (×2): qty 30, 30d supply, fill #8

## 2021-09-14 NOTE — Progress Notes (Signed)
Patient ID: Alexandra Brewer, female    DOB: [birth year]  MRN: [id number]  CC: Hypertension   Subjective: Alexandra Brewer is a 66 y.o. female who presents for chronic ds management Her concerns today include:  HTN, thyroid ds, HL, GERD, Frozen shoulder RT, asthma mild persistent and food allergy (followed by allergy/asthma ctr)  Had cholecystectomy  04/2021.  Doing well. Seen UC 08/30/2021 for urinary symptoms.  Dx with UTI and treated with Nitrofurantoin. No dysuria or frequency any more but sometimes she gets a   HTN:  taking Norvasc.  Checks BP daily.  Sometimes high and sometimes low Limits salt in foods.  No chest pains or shortness of breath.  Hypothyroid:  compliant with Levothyroxine 112 mcg  Complains of rash around the neck that started 2 weeks ago.  It itches and burns.  No initiating factors.  She does not spray perfume on her neck.  No recent dye to the hair.  No recent changes in body products.   Patient Active Problem List   Diagnosis Date Noted   Abdominal pain, epigastric 05/16/2021   Esophageal dysphagia 05/16/2021   Calculus of gallbladder without cholecystitis without obstruction 01/27/2021   Fatty liver 01/27/2021   Class 1 obesity due to excess calories with serious comorbidity and body mass index (BMI) of 31.0 to 31.9 in adult 01/27/2021   Long term (current) use of non-steroidal anti-inflammatories (nsaid) 01/12/2021   Migraine without aura and without status migrainosus, not intractable 05/31/2020   Abdominal pain 03/10/2018   Sciatica of right side 06/11/2017   RUQ abdominal pain 01/23/2017   GERD (gastroesophageal reflux disease) 11/26/2016   Right Achilles tendinitis 08/15/2016   Hypertension 07/05/2016   Adhesive capsulitis of right shoulder 02/21/2016   Heel pain, bilateral 01/10/2016   Varicose veins of left lower extremity with pain 01/10/2016   History of colonic polyps    Diverticulosis of colon without hemorrhage    Hypothyroidism  08/10/2015   Chronic constipation 08/05/2015   Hyperlipidemia 08/05/2015     Current Outpatient Medications on File Prior to Visit  Medication Sig Dispense Refill   albuterol (VENTOLIN HFA) 108 (90 Base) MCG/ACT inhaler Inhale 2 puffs into the lungs every 6 (six) hours as needed for wheezing or shortness of breath.     cetirizine (ZYRTEC) 10 MG chewable tablet Chew 1 tablet (10 mg total) by mouth daily. 30 tablet 5   diclofenac Sodium (VOLTAREN) 1 % GEL Apply 4 g topically 4 (four) times daily.     EPINEPHrine 0.3 mg/0.3 mL IJ SOAJ injection Inject 0.3 mg into the muscle as needed for anaphylaxis. 2 each 1   fluticasone (FLOVENT HFA) 110 MCG/ACT inhaler USE 2 PUFFS TWICE A DAY WITH SPACER TO HELP PREVENT COUGH AND WHEEZE 12 g 3   ipratropium (ATROVENT) 0.06 % nasal spray Place 2 sprays into both nostrils in the morning and at bedtime. 15 mL 5   linaclotide (LINZESS) 72 MCG capsule Take 1 capsule (72 mcg total) by mouth daily before breakfast. 30 capsule 5   meloxicam (MOBIC) 15 MG tablet TAKE 1 TABLET (15 MG TOTAL) BY MOUTH DAILY. 30 tablet 3   montelukast (SINGULAIR) 10 MG tablet TAKE 1 TABLET (10 MG TOTAL) BY MOUTH AT BEDTIME. 30 tablet 5   Olopatadine HCl 0.2 % SOLN Place 1 drop in each eye once a day as needed for itchy watery eyes 2.5 mL 5   pantoprazole (PROTONIX) 40 MG tablet Take 1 tablet (40 mg total) by mouth  2 (two) times daily before a meal. 60 tablet 5   traMADol (ULTRAM) 50 MG tablet Take 1 tablet (50 mg total) by mouth every 6 (six) hours as needed. 25 tablet 0   Wheat Dextrin (BENEFIBER) POWD Take by mouth daily. Two tablespoons     [DISCONTINUED] fluticasone (FLONASE) 50 MCG/ACT nasal spray Place 2 sprays into both nostrils daily as needed for allergies or rhinitis. 16 g 6   No current facility-administered medications on file prior to visit.    Allergies  Allergen Reactions   Fish Allergy     Throat swell up   Naproxen Rash   Shellfish Allergy     Throat swell up    Penicillins     Social History   Socioeconomic History   Marital status: Single    Spouse name: Not on file   Number of children: 1   Years of education: Not on file   Highest education level: 3rd grade  Occupational History   Not on file  Tobacco Use   Smoking status: Never   Smokeless tobacco: Never  Vaping Use   Vaping Use: Never used  Substance and Sexual Activity   Alcohol use: No    Alcohol/week: 0.0 standard drinks of alcohol   Drug use: No   Sexual activity: Yes    Birth control/protection: Post-menopausal  Other Topics Concern   Not on file  Social History Narrative   Not on file   Social Determinants of Health   Financial Resource Strain: Not on file  Food Insecurity: Food Insecurity Present (01/31/2021)   Hunger Vital Sign    Worried About Running Out of Food in the Last Year: Sometimes true    Ran Out of Food in the Last Year: Sometimes true  Transportation Needs: Unmet Transportation Needs (01/31/2021)   PRAPARE - Hydrologist (Medical): Yes    Lack of Transportation (Non-Medical): Yes  Physical Activity: Not on file  Stress: Not on file  Social Connections: Not on file  Intimate Partner Violence: Not on file    Family History  Problem Relation Age of Onset   Allergic rhinitis Mother    Diabetes Father    Hypertension Father    Asthma Father    Stroke Maternal Grandmother    Stroke Maternal Grandfather    Colon cancer Neg Hx    Angioedema Neg Hx    Atopy Neg Hx    Immunodeficiency Neg Hx    Urticaria Neg Hx    Breast cancer Neg Hx     Past Surgical History:  Procedure Laterality Date   BREAST BIOPSY Left 2018   FIBROCYSTIC CHANGES WITH FEATURES OF RUPTURE   CATARACT EXTRACTION     CHOLECYSTECTOMY N/A 05/26/2021   Procedure: LAPAROSCOPIC CHOLECYSTECTOMY;  Surgeon: Aviva Signs, MD;  Location: [facility];  Service: General;  Laterality: N/A;   COLONOSCOPY N/A 10/10/2015   Procedure: COLONOSCOPY;  Surgeon: Daneil Dolin, MD;  Location: [facility];  Service: Endoscopy;  Laterality: N/A;  245 - Interpreter scheduled, do NOT move   GALLBLADDER SURGERY  05/26/2021    ROS: Review of Systems Negative except as stated above  PHYSICAL EXAM: BP 120/70   Pulse 71   Temp 98.7 F (37.1 C) (Oral)   Ht 5' (1.524 m)   Wt 153 lb 3.2 oz (69.5 kg)   SpO2 98%   BMI 29.92 kg/m   Physical Exam  General appearance - alert, well appearing, and in no distress  Mental status - normal mood, behavior, speech, dress, motor activity, and thought processes Neck - supple, no significant adenopathy Chest - clear to auscultation, no wheezes, rales or rhonchi, symmetric air entry Extremities - peripheral pulses normal, no pedal edema, no clubbing or cyanosis Skin -patient has slightly raised hyperpigmented rash noted around the neck most pronounced on the right side.      Latest Ref Rng & Units 06/05/2021   11:21 AM 05/23/2021    1:21 PM 01/12/2021    9:33 AM  CMP  Glucose 70 - 99 mg/dL 91  80  89   BUN 8 - 27 mg/dL '9  15  9   '$ Creatinine 0.57 - 1.00 mg/dL 0.83  0.52  0.65   Sodium 134 - 144 mmol/L 141  141  141   Potassium 3.5 - 5.2 mmol/L 4.6  4.1  4.9   Chloride 96 - 106 mmol/L 101  105  103   CO2 20 - 29 mmol/L 25  28    Calcium 8.7 - 10.3 mg/dL 9.8  9.4  9.7   Total Protein 6.0 - 8.5 g/dL 8.3  7.9  7.5   Total Bilirubin 0.0 - 1.2 mg/dL 0.3  0.4  0.3   Alkaline Phos 44 - 121 IU/L 137  105  129   AST 0 - 40 IU/L '19  21  17   '$ ALT 0 - 32 IU/L 18  20     Lipid Panel     Component Value Date/Time   CHOL 203 (H) 06/06/2020 1342   TRIG 356 (H) 06/06/2020 1342   HDL 46 06/06/2020 1342   CHOLHDL 4.4 06/06/2020 1342   CHOLHDL 3.8 07/14/2015 0908   VLDL 18 07/14/2015 0908   LDLCALC 97 06/06/2020 1342    CBC    Component Value Date/Time   WBC 7.9 06/05/2021 1121   WBC 7.7 05/23/2021 1321   RBC 4.88 06/05/2021 1121   RBC 4.75 05/23/2021 1321   HGB 13.8 06/05/2021 1121   HCT 42.3 06/05/2021 1121   PLT  414 (H) 05/23/2021 1321   PLT 388 01/12/2021 0933   MCV 87 06/05/2021 1121   MCH 28.3 06/05/2021 1121   MCH 29.1 05/23/2021 1321   MCHC 32.6 06/05/2021 1121   MCHC 32.7 05/23/2021 1321   RDW 13.1 06/05/2021 1121   LYMPHSABS 1.8 06/05/2021 1121   MONOABS 0.8 05/23/2021 1321   EOSABS 0.1 06/05/2021 1121   BASOSABS 0.0 06/05/2021 1121    ASSESSMENT AND PLAN:  1. Essential hypertension At goal.  Continue Norvasc and low-salt diet - amLODipine (NORVASC) 5 MG tablet; Take 1 tablet (5 mg total) by mouth daily.  Dispense: 90 tablet; Refill: 2  2. Acquired hypothyroidism Continue levothyroxine.  Check TSH today. - TSH - levothyroxine (SYNTHROID) 112 MCG tablet; TAKE 1 TABLET (112 MCG TOTAL) BY MOUTH DAILY.  Dispense: 90 tablet; Refill: 2  3. Dermatitis Looks like some type of contact or allergic dermatitis.  I recommend trying triamcinolone cream. - triamcinolone cream (KENALOG) 0.1 %; Apply 1 Application topically 2 (two) times daily.  Dispense: 30 g; Refill: 0  AMN Language interpreter used during this encounter. #[id number] Palau  Patient was given the opportunity to ask questions.  Patient verbalized understanding of the plan and was able to repeat key elements of the plan.   This documentation was completed using Radio producer.  Any transcriptional errors are unintentional.  Orders Placed This Encounter  Procedures   TSH     Requested  Prescriptions   Signed Prescriptions Disp Refills   levothyroxine (SYNTHROID) 112 MCG tablet 90 tablet 2    Sig: TAKE 1 TABLET (112 MCG TOTAL) BY MOUTH DAILY.   amLODipine (NORVASC) 5 MG tablet 90 tablet 2    Sig: Take 1 tablet (5 mg total) by mouth daily.   triamcinolone cream (KENALOG) 0.1 % 30 g 0    Sig: Apply 1 Application topically 2 (two) times daily.    Return in about 4 months (around 01/14/2022).  Karle Plumber, MD, FACP

## 2021-09-15 ENCOUNTER — Other Ambulatory Visit: Payer: Self-pay

## 2021-09-15 LAB — TSH: TSH: 0.684 u[IU]/mL (ref 0.450–4.500)

## 2021-09-25 ENCOUNTER — Ambulatory Visit: Payer: No Typology Code available for payment source | Admitting: Gastroenterology

## 2021-09-27 ENCOUNTER — Other Ambulatory Visit: Payer: Self-pay

## 2021-10-12 ENCOUNTER — Other Ambulatory Visit (HOSPITAL_COMMUNITY): Payer: Self-pay

## 2021-10-13 ENCOUNTER — Other Ambulatory Visit: Payer: Self-pay

## 2021-10-30 ENCOUNTER — Other Ambulatory Visit: Payer: Self-pay

## 2021-11-07 ENCOUNTER — Telehealth: Payer: Self-pay

## 2021-11-07 MED ORDER — ALBUTEROL SULFATE HFA 108 (90 BASE) MCG/ACT IN AERS
2.0000 | INHALATION_SPRAY | Freq: Four times a day (QID) | RESPIRATORY_TRACT | 0 refills | Status: DC | PRN
  Filled 2021-11-07: qty 18, 25d supply, fill #0

## 2021-11-07 NOTE — Addendum Note (Signed)
Addended by: Tommas Olp B on: 11/07/2021 07:14 PM   Modules accepted: Orders

## 2021-11-07 NOTE — Telephone Encounter (Signed)
Patients family member Vicente Males called stating the patient needed a refill on her albuterol inhaler.  Okay to call patients phone number back and leave a voicemail. [place]

## 2021-11-08 ENCOUNTER — Other Ambulatory Visit: Payer: Self-pay

## 2021-11-20 ENCOUNTER — Ambulatory Visit: Payer: No Typology Code available for payment source | Admitting: Family

## 2021-11-24 ENCOUNTER — Other Ambulatory Visit: Payer: Self-pay

## 2021-11-29 ENCOUNTER — Other Ambulatory Visit: Payer: Self-pay

## 2021-12-05 ENCOUNTER — Other Ambulatory Visit: Payer: Self-pay

## 2021-12-11 ENCOUNTER — Other Ambulatory Visit: Payer: Self-pay

## 2021-12-12 ENCOUNTER — Ambulatory Visit: Payer: No Typology Code available for payment source | Admitting: Family

## 2021-12-13 ENCOUNTER — Other Ambulatory Visit: Payer: Self-pay

## 2021-12-14 ENCOUNTER — Other Ambulatory Visit: Payer: Self-pay

## 2021-12-14 MED ORDER — ARNUITY ELLIPTA 100 MCG/ACT IN AEPB: 1.0000 | INHALATION_SPRAY | Freq: Every day | RESPIRATORY_TRACT | 5 refills | Status: DC

## 2021-12-18 ENCOUNTER — Other Ambulatory Visit: Payer: Self-pay

## 2021-12-19 ENCOUNTER — Other Ambulatory Visit: Payer: Self-pay

## 2021-12-26 ENCOUNTER — Other Ambulatory Visit: Payer: Self-pay

## 2021-12-27 ENCOUNTER — Other Ambulatory Visit: Payer: Self-pay

## 2021-12-27 DIAGNOSIS — Z1231 Encounter for screening mammogram for malignant neoplasm of breast: Secondary | ICD-10-CM

## 2022-01-01 ENCOUNTER — Ambulatory Visit: Payer: No Typology Code available for payment source | Admitting: Family

## 2022-01-04 ENCOUNTER — Other Ambulatory Visit: Payer: Self-pay

## 2022-01-05 ENCOUNTER — Other Ambulatory Visit: Payer: Self-pay

## 2022-01-05 ENCOUNTER — Other Ambulatory Visit: Payer: Self-pay | Admitting: Family

## 2022-01-09 ENCOUNTER — Other Ambulatory Visit: Payer: Self-pay

## 2022-01-14 ENCOUNTER — Other Ambulatory Visit: Payer: Self-pay | Admitting: *Deleted

## 2022-01-15 ENCOUNTER — Ambulatory Visit: Payer: No Typology Code available for payment source | Admitting: Family

## 2022-01-15 ENCOUNTER — Ambulatory Visit: Payer: No Typology Code available for payment source | Attending: Internal Medicine | Admitting: Internal Medicine

## 2022-01-15 ENCOUNTER — Other Ambulatory Visit: Payer: Self-pay

## 2022-01-15 ENCOUNTER — Other Ambulatory Visit: Payer: Self-pay | Admitting: Internal Medicine

## 2022-01-15 ENCOUNTER — Encounter: Payer: Self-pay | Admitting: Internal Medicine

## 2022-01-15 VITALS — BP 120/82 | HR 68 | Temp 97.8°F | Ht 61.0 in | Wt 153.8 lb

## 2022-01-15 DIAGNOSIS — M25562 Pain in left knee: Secondary | ICD-10-CM

## 2022-01-15 DIAGNOSIS — M25561 Pain in right knee: Secondary | ICD-10-CM

## 2022-01-15 DIAGNOSIS — I10 Essential (primary) hypertension: Secondary | ICD-10-CM

## 2022-01-15 DIAGNOSIS — K219 Gastro-esophageal reflux disease without esophagitis: Secondary | ICD-10-CM

## 2022-01-15 DIAGNOSIS — G8929 Other chronic pain: Secondary | ICD-10-CM

## 2022-01-15 DIAGNOSIS — Z23 Encounter for immunization: Secondary | ICD-10-CM

## 2022-01-15 DIAGNOSIS — E039 Hypothyroidism, unspecified: Secondary | ICD-10-CM

## 2022-01-15 DIAGNOSIS — H547 Unspecified visual loss: Secondary | ICD-10-CM

## 2022-01-15 DIAGNOSIS — M5431 Sciatica, right side: Secondary | ICD-10-CM

## 2022-01-15 DIAGNOSIS — Z8669 Personal history of other diseases of the nervous system and sense organs: Secondary | ICD-10-CM

## 2022-01-15 MED ORDER — PANTOPRAZOLE SODIUM 40 MG PO TBEC
40.0000 mg | DELAYED_RELEASE_TABLET | Freq: Every day | ORAL | 3 refills | Status: DC
  Filled 2022-01-15 (×2): qty 30, 30d supply, fill #0
  Filled 2022-03-05: qty 30, 30d supply, fill #1
  Filled 2022-06-25: qty 60, 60d supply, fill #2
  Filled 2022-06-25: qty 30, 30d supply, fill #2
  Filled 2022-10-05 (×2): qty 30, 30d supply, fill #3

## 2022-01-15 MED ORDER — ZOSTER VAC RECOMB ADJUVANTED 50 MCG/0.5ML IM SUSR
0.5000 mL | Freq: Once | INTRAMUSCULAR | 0 refills | Status: AC
  Filled 2022-05-21: qty 0.5, 1d supply, fill #0

## 2022-01-15 MED ORDER — MELOXICAM 15 MG PO TABS
15.0000 mg | ORAL_TABLET | Freq: Every day | ORAL | 2 refills | Status: DC
  Filled 2022-01-15: qty 90, 90d supply, fill #0

## 2022-01-15 MED ORDER — AMLODIPINE BESYLATE 5 MG PO TABS
5.0000 mg | ORAL_TABLET | Freq: Every day | ORAL | 2 refills | Status: DC
  Filled 2022-01-15 – 2022-03-05 (×2): qty 90, 90d supply, fill #0
  Filled 2022-06-25: qty 90, 90d supply, fill #1
  Filled 2022-06-25: qty 30, 30d supply, fill #1
  Filled 2022-08-23: qty 30, 30d supply, fill #2
  Filled 2022-10-31: qty 30, 30d supply, fill #3
  Filled 2022-11-28: qty 30, 30d supply, fill #4

## 2022-01-15 NOTE — Progress Notes (Signed)
Patient ID: Alexandra Brewer, female    DOB: 1956-01-07  MRN: [id number]  CC: Hypertension (HTN f/u. Discuss taking mediations at night due to dizziness. Shirley Muscat seeing [facility], headaches,. Interested in opthalmology referral. /Still taking thyroid meds. Med refill/Yes to flu vax.)   Subjective: Alexandra Brewer is a 66 y.o. female who presents for chronic ds management Her concerns today include:  HTN, thyroid ds, HL, GERD, Frozen shoulder RT, asthma mild persistent and food allergy (followed by allergy/asthma ctr)  HTN:  takes Norvasc at nights.  Gets dizzy during the day if she takes in a.m Checks BP once a day in mornings.  Range 120-145/75-80 Limits salt in foods  Hypothyroid:  taking Levothyroxine 112 mcg daily consistently.  C/o having problems seeing out [facility]. Has problems seeing [facility] x1 yr.  Getting worse.  Has been having HA and thinks it is to do with the eyes.  Last eye exam was about 2 yrs ago and was told she has cataracts and was placed on eye drops.   Has not return due to lack of finances.  -"headaches are very light."  Started 3 mths ago.  Told by her allergist that may be related to allergies. HA frontal.  Comes every now and then but not every wk.   Last 10-15 mins.  Does not take anything.  No associated photophobia or nausea.  Request RF on Pantoprazole for GERD.  Med list has her taking it twice a day but patient states she is only needing to take it once a day.  Requests refill on meloxicam 15 mg.  Not on current med list but patient has a bottle with her with 1 more tablet in it.  Looks like it was prescribed by orthopedics initially for pain in her knees.  She reports good results with it.   Patient Active Problem List   Diagnosis Date Noted   Abdominal pain, epigastric 05/16/2021   Esophageal dysphagia 05/16/2021   Calculus of gallbladder without cholecystitis without obstruction 01/27/2021   Fatty liver 01/27/2021   Class 1 obesity due  to excess calories with serious comorbidity and body mass index (BMI) of 31.0 to 31.9 in adult 01/27/2021   Long term (current) use of non-steroidal anti-inflammatories (nsaid) 01/12/2021   Migraine without aura and without status migrainosus, not intractable 05/31/2020   Abdominal pain 03/10/2018   Sciatica of right side 06/11/2017   RUQ abdominal pain 01/23/2017   GERD (gastroesophageal reflux disease) 11/26/2016   Right Achilles tendinitis 08/15/2016   Hypertension 07/05/2016   Adhesive capsulitis of right shoulder 02/21/2016   Heel pain, bilateral 01/10/2016   Varicose veins of left lower extremity with pain 01/10/2016   History of colonic polyps    Diverticulosis of colon without hemorrhage    Hypothyroidism 08/10/2015   Chronic constipation 08/05/2015   Hyperlipidemia 08/05/2015     Current Outpatient Medications on File Prior to Visit  Medication Sig Dispense Refill   albuterol (VENTOLIN HFA) 108 (90 Base) MCG/ACT inhaler Inhale 2 puffs into the lungs every 6 (six) hours as needed for wheezing or shortness of breath. must keep appt 12/12/21 for refills 18 g 0   cetirizine (ZYRTEC) 10 MG chewable tablet Chew 1 tablet (10 mg total) by mouth daily. 30 tablet 5   EPINEPHrine 0.3 mg/0.3 mL IJ SOAJ injection Inject 0.3 mg into the muscle as needed for anaphylaxis. 2 each 1   Fluticasone Furoate (ARNUITY ELLIPTA) 100 MCG/ACT AEPB Inhale  1 puff into the lungs daily. 30 each 5   ipratropium (ATROVENT) 0.06 % nasal spray Place 2 sprays into both nostrils in the morning and at bedtime. 15 mL 5   levothyroxine (SYNTHROID) 112 MCG tablet TAKE 1 TABLET (112 MCG TOTAL) BY MOUTH DAILY. 90 tablet 2   montelukast (SINGULAIR) 10 MG tablet TAKE 1 TABLET (10 MG TOTAL) BY MOUTH AT BEDTIME. 30 tablet 5   Olopatadine HCl 0.2 % SOLN Place 1 drop in each eye once a day as needed for itchy watery eyes 2.5 mL 5   Wheat Dextrin (BENEFIBER) POWD Take by mouth daily. Two tablespoons     fluticasone (FLOVENT  HFA) 110 MCG/ACT inhaler USE 2 PUFFS TWICE A DAY WITH SPACER TO HELP PREVENT COUGH AND WHEEZE (Patient not taking: Reported on 01/15/2022) 12 g 3   triamcinolone cream (KENALOG) 0.1 % Apply 1 Application topically 2 (two) times daily. (Patient not taking: Reported on 01/15/2022) 30 g 0   [DISCONTINUED] fluticasone (FLONASE) 50 MCG/ACT nasal spray Place 2 sprays into both nostrils daily as needed for allergies or rhinitis. 16 g 6   No current facility-administered medications on file prior to visit.    Allergies  Allergen Reactions   Fish Allergy     Throat swell up   Naproxen Rash   Shellfish Allergy     Throat swell up   Penicillins     Social History   Socioeconomic History   Marital status: Single    Spouse name: Not on file   Number of children: 1   Years of education: Not on file   Highest education level: 3rd grade  Occupational History   Not on file  Tobacco Use   Smoking status: Never   Smokeless tobacco: Never  Vaping Use   Vaping Use: Never used  Substance and Sexual Activity   Alcohol use: No    Alcohol/week: 0.0 standard drinks of alcohol   Drug use: No   Sexual activity: Yes    Birth control/protection: Post-menopausal  Other Topics Concern   Not on file  Social History Narrative   Not on file   Social Determinants of Health   Financial Resource Strain: Not on file  Food Insecurity: Food Insecurity Present (01/31/2021)   Hunger Vital Sign    Worried About Running Out of Food in the Last Year: Sometimes true    Ran Out of Food in the Last Year: Sometimes true  Transportation Needs: Unmet Transportation Needs (01/31/2021)   PRAPARE - Hydrologist (Medical): Yes    Lack of Transportation (Non-Medical): Yes  Physical Activity: Not on file  Stress: Not on file  Social Connections: Not on file  Intimate Partner Violence: Not on file    Family History  Problem Relation Age of Onset   Allergic rhinitis Mother    Diabetes  Father    Hypertension Father    Asthma Father    Stroke Maternal Grandmother    Stroke Maternal Grandfather    Colon cancer Neg Hx    Angioedema Neg Hx    Atopy Neg Hx    Immunodeficiency Neg Hx    Urticaria Neg Hx    Breast cancer Neg Hx     Past Surgical History:  Procedure Laterality Date   BREAST BIOPSY Left 2018   FIBROCYSTIC CHANGES WITH FEATURES OF RUPTURE   CATARACT EXTRACTION     CHOLECYSTECTOMY N/A 05/26/2021   Procedure: LAPAROSCOPIC CHOLECYSTECTOMY;  Surgeon: Aviva Signs, MD;  Location: [facility];  Service: General;  Laterality: N/A;   COLONOSCOPY N/A 10/10/2015   Procedure: COLONOSCOPY;  Surgeon: Daneil Dolin, MD;  Location: [facility];  Service: Endoscopy;  Laterality: N/A;  245 - Interpreter scheduled, do NOT move   GALLBLADDER SURGERY  05/26/2021    ROS: Review of Systems Negative except as stated above  PHYSICAL EXAM: BP 120/82   Pulse 68   Temp 97.8 F (36.6 C) (Oral)   Ht '5\' 1"'$  (1.549 m)   Wt 153 lb 12.8 oz (69.8 kg)   SpO2 99%   BMI 29.06 kg/m   Physical Exam  General appearance - alert, well appearing, older Hispanic female and in no distress Mental status - normal mood, behavior, speech, dress, motor activity, and thought processes Eyes: No conjunctival injection Chest - clear to auscultation, no wheezes, rales or rhonchi, symmetric air entry Heart - normal rate, regular rhythm, normal S1, S2, no murmurs, rubs, clicks or gallops Extremities - peripheral pulses normal, no pedal edema, no clubbing or cyanosis      Latest Ref Rng & Units 06/05/2021   11:21 AM 05/23/2021    1:21 PM 01/12/2021    9:33 AM  CMP  Glucose 70 - 99 mg/dL 91  80  89   BUN 8 - 27 mg/dL '9  15  9   '$ Creatinine 0.57 - 1.00 mg/dL 0.83  0.52  0.65   Sodium 134 - 144 mmol/L 141  141  141   Potassium 3.5 - 5.2 mmol/L 4.6  4.1  4.9   Chloride 96 - 106 mmol/L 101  105  103   CO2 20 - 29 mmol/L 25  28    Calcium 8.7 - 10.3 mg/dL 9.8  9.4  9.7   Total Protein 6.0 - 8.5  g/dL 8.3  7.9  7.5   Total Bilirubin 0.0 - 1.2 mg/dL 0.3  0.4  0.3   Alkaline Phos 44 - 121 IU/L 137  105  129   AST 0 - 40 IU/L '19  21  17   '$ ALT 0 - 32 IU/L 18  20     Lipid Panel     Component Value Date/Time   CHOL 203 (H) 06/06/2020 1342   TRIG 356 (H) 06/06/2020 1342   HDL 46 06/06/2020 1342   CHOLHDL 4.4 06/06/2020 1342   CHOLHDL 3.8 07/14/2015 0908   VLDL 18 07/14/2015 0908   LDLCALC 97 06/06/2020 1342    CBC    Component Value Date/Time   WBC 7.9 06/05/2021 1121   WBC 7.7 05/23/2021 1321   RBC 4.88 06/05/2021 1121   RBC 4.75 05/23/2021 1321   HGB 13.8 06/05/2021 1121   HCT 42.3 06/05/2021 1121   PLT 414 (H) 05/23/2021 1321   PLT 388 01/12/2021 0933   MCV 87 06/05/2021 1121   MCH 28.3 06/05/2021 1121   MCH 29.1 05/23/2021 1321   MCHC 32.6 06/05/2021 1121   MCHC 32.7 05/23/2021 1321   RDW 13.1 06/05/2021 1121   LYMPHSABS 1.8 06/05/2021 1121   MONOABS 0.8 05/23/2021 1321   EOSABS 0.1 06/05/2021 1121   BASOSABS 0.0 06/05/2021 1121    ASSESSMENT AND PLAN: 1. Essential hypertension Blood pressure close to goal.  She will continue Norvasc 5 mg daily. - amLODipine (NORVASC) 5 MG tablet; Take 1 tablet (5 mg total) by mouth daily.  Dispense: 90 tablet; Refill: 2  2. Acquired hypothyroidism Continue levothyroxine 112 mcg daily.  Recheck TSH today. - TSH  3. Poor  vision Patient is uninsured.  Ophthalmologist in the area do not except orange card/cone discount.  Recommend that she try to save funds and get in with an ophthalmologist sooner rather than later.  She wanted me to submit a referral for her nonetheless. - Ambulatory referral to Ophthalmology  4. History of cataract See #3 above  5. Chronic pain of both knees - meloxicam (MOBIC) 15 MG tablet; Take 1 tablet (15 mg total) by mouth daily.  Dispense: 30 tablet; Refill: 2  6. Gastroesophageal reflux disease without esophagitis - pantoprazole (PROTONIX) 40 MG tablet; Take 1 tablet (40 mg total) by mouth  daily.  Dispense: 30 tablet; Refill: 3  7. Need for shingles vaccine Patient agreeable to receiving Shingrix vaccine series.  Prescription given for her to take to the pharmacy to get the first shot today. - Zoster Vaccine Adjuvanted [facility]) injection; Inject 0.5 mLs into the muscle once for 1 dose.  Dispense: 0.5 mL; Refill: 0  8. Need for immunization against influenza - Flu Vaccine QUAD 28moIM (Fluarix, Fluzone & Alfiuria Quad PF)   AMN Language interpreter used during this encounter. ##[id number] [place] Patient was given the opportunity to ask questions.  Patient verbalized understanding of the plan and was able to repeat key elements of the plan.   This documentation was completed using DRadio producer  Any transcriptional errors are unintentional.  Orders Placed This Encounter  Procedures   Flu Vaccine QUAD 633moM (Fluarix, Fluzone & Alfiuria Quad PF)   TSH   Ambulatory referral to Ophthalmology     Requested Prescriptions   Signed Prescriptions Disp Refills   [facility] 0    Sig: Inject 0.5 mLs into the muscle once for 1 dose.   meloxicam (MOBIC) 15 MG tablet 30 tablet 2    Sig: Take 1 tablet (15 mg total) by mouth daily.   amLODipine (NORVASC) 5 MG tablet 90 tablet 2    Sig: Take 1 tablet (5 mg total) by mouth daily.   pantoprazole (PROTONIX) 40 MG tablet 30 tablet 3    Sig: Take 1 tablet (40 mg total) by mouth daily.    Return in about 4 months (around 05/18/2022).  DeKarle PlumberMD, FACP

## 2022-01-16 ENCOUNTER — Other Ambulatory Visit: Payer: Self-pay

## 2022-01-16 LAB — TSH: TSH: 4.31 u[IU]/mL (ref 0.450–4.500)

## 2022-01-23 ENCOUNTER — Other Ambulatory Visit: Payer: Self-pay

## 2022-01-26 ENCOUNTER — Other Ambulatory Visit: Payer: Self-pay

## 2022-01-28 NOTE — Patient Instructions (Incomplete)
Allergic rhinitis with conjunctivitis (tree pollen and mold mix 2) -Continue Zyrtec (cetirizine) once a day as needed for runny nose/itching. (Unable to get carbinoxamine due to cost per pharmacy. The pharmacy also does not carry Allegra. Has been on Zyrtec previously and was thought to be not effective.) -Continue Singulair 10 mg at night -Continue Atrovent 2 sprays each nostril twice a day (may use up to 3 times a day if needed for nasal drainage) -Continue olopatadine 0.2% eyedrops using 1 drop each eye once a day as needed for itchy watery eyes  Mild persistent asthma -For asthma flares/upper respiratory tract infections start  Flovent 110 mcg 2 puffs twice a day with spacer for 1-2 weeks or until symptoms return to baseline. After you use up all of your Flovent you may use Arnuity  in place of the Flovent doing 1 puff once a day for 1-2 weeks during asthma flares/upper respiratory tract infections. -Continue Singulair 10 mg as above to help prevent cough and wheeze -May use Ventolin (albuterol) 2 puffs every 4 hours as needed for cough, wheeze, tightness in chest, shortness of breath.  Also, may use albuterol 2 puffs 5 to 15 minutes prior to exercise.  Asthma control goals:  Full participation in all desired activities (may need albuterol before activity) Albuterol use two time or less a week on average (not counting use with activity) Cough interfering with sleep two time or less a month Oral steroids no more than once a year No hospitalizations  Anaphylaxis due to food -Continue avoid  red meat, fish and shellfish. In case of an allergic reaction, give Benadryl 4 teaspoonfuls every 4 hours, and if life-threatening symptoms occur, inject with EpiPen 0.3 mg.  Urticaria (hives) -Increase Zyrtec (cetirizine) 10 mg twice a day.  Caution as this can make you sleepy - Please get additional blood work  that was ordered previously to follow-up on this.  We will call you with results once they  are all back.  Please let us know if this treatment plan is not working well for you Schedule a follow-up appointment in 3 months with Dr. Nelva Bush or sooner if needed

## 2022-01-29 ENCOUNTER — Other Ambulatory Visit: Payer: Self-pay

## 2022-01-29 ENCOUNTER — Ambulatory Visit (INDEPENDENT_AMBULATORY_CARE_PROVIDER_SITE_OTHER): Payer: No Typology Code available for payment source | Admitting: Family

## 2022-01-29 ENCOUNTER — Encounter: Payer: Self-pay | Admitting: Family

## 2022-01-29 VITALS — BP 132/84 | HR 74 | Temp 98.2°F | Resp 16 | Ht 64.0 in | Wt 152.8 lb

## 2022-01-29 DIAGNOSIS — H1013 Acute atopic conjunctivitis, bilateral: Secondary | ICD-10-CM

## 2022-01-29 DIAGNOSIS — J4530 Mild persistent asthma, uncomplicated: Secondary | ICD-10-CM

## 2022-01-29 DIAGNOSIS — T7800XD Anaphylactic reaction due to unspecified food, subsequent encounter: Secondary | ICD-10-CM

## 2022-01-29 DIAGNOSIS — J3089 Other allergic rhinitis: Secondary | ICD-10-CM

## 2022-01-29 DIAGNOSIS — L508 Other urticaria: Secondary | ICD-10-CM

## 2022-01-29 NOTE — Progress Notes (Signed)
[place] [address] Dept: [phone]  FOLLOW UP NOTE  Patient ID: Alexandra Brewer, female    DOB: 03-Sep-1955  Age: 66 y.o. MRN: [id number] Date of Office Visit: 01/29/2022  Assessment  Chief Complaint: Asthma (3 mth f/u - Good), Non-seasonal allergic rhinitis (3 mth f/u - A little better), Allergic conjunctivitis (3 mth f/u - Was fine but now very itchy eyes due to weather), Food Allergy (3 mth f/u - patient states she has been avoiding all food allergens), and Chronic Urticaria (3 mth f/u - on/off still No change)  HPI Alexandra Brewer is a 66 year old female who presents today for follow-up of mild persistent asthma, seasonal allergic rhinitis, allergy with anaphylaxis due to food, allergic conjunctivitis, and chronic urticaria. She was last seen on Aug 07, 2021.  An interpreter is here with her today and helps provide history.  She denies any new diagnosis or surgery since her last.  Allergic rhinitis she reports rhinorrhea if she is around grass. If she smells grass she will get a runny nose and headache.  She denies any nasal  congestion  and post nasal drip.  She has not had any sinus infections since we last saw her.  She is currently taking Zyrtec 10 mg once a day, Singulair 10 mg, Atrovent nasal spray daily, and olopatadine eyedrops as needed for itchy watery eyes.  She denies itchy watery eyes.  She continues to avoid fish shellfish, and mentions that she rarely eats red meat.  She has not had to use her epinephrine autoinjector device since we last saw her.  Mild persistent asthma: She is currently only using Flovent 110 mcg as needed rather than 2 puffs twice a day with spacer as prescribed.  She does take Singulair 10 mg once a day and uses albuterol as needed.  She mentions that she uses Flovent only when she has pressure in her chest.  She will use her albuterol when there are changes in weather or if it is cold and rainy.  She has not used her albuterol any  this month.  She denies coughing, wheezing, tightness in chest, shortness of breath, and nocturnal awakenings due to breathing problems. She has not required any systemic steroids or made any trips to the ER or urgent care due to breathing problems since her last office visit.  She mentions that her  primary care physician gave her  a prescription for Arnuity but she does not know how to use it or when to use it. She has not used Arnuity yet.  Urticaria. She continues to take Zyrtec 10 mg once a day. She did not increase Zyrtec to twice a day because she thought that it would be too much. She gets itchy areas that are red almost every day. They occur on her neck/upper chest area and are never on her arms and legs. Reviewed lab work for hives from June 05, 2021. She does rarely eat red meat. Instructed her to start avoiding red meat for now due to elevated alpha gal.She has not gotten the other labs that were ordered due to her insurance.    Drug Allergies:  Allergies  Allergen Reactions   Fish Allergy     Throat swell up   Naproxen Rash   Shellfish Allergy     Throat swell up   Penicillins     Review of Systems: Review of Systems  Constitutional:  Negative for chills and fever.  HENT:  Reports rhinorrhea with grass and denies nasal congestion and post nasal drip  Eyes:        Denies itchy watery eyes  Respiratory:  Negative for cough, shortness of breath and wheezing.   Cardiovascular:  Negative for chest pain and palpitations.  Gastrointestinal:        Denies heartburn or reflux symptoms since taking her medication  Genitourinary:  Negative for frequency.  Skin:        Reports itchy rash at times on neck chest area. It is never on her arms and legs  Neurological:  Negative for headaches.  Endo/Heme/Allergies:  Positive for environmental allergies.     Physical Exam: BP 132/84   Pulse 74   Temp 98.2 F (36.8 C)   Resp 16   Ht '5\' 4"'$  (1.626 m)   Wt 152 lb 12.8 oz (69.3  kg)   SpO2 100%   BMI 26.23 kg/m    Physical Exam Constitutional:      Appearance: Normal appearance.  HENT:     Head: Normocephalic and atraumatic.     Comments: Pharynx normal. Eyes normal. Ears: unable to see right tympanic membrane due to cerumen. Left ear normal. Nose normal.    Right Ear: Ear canal and external ear normal.     Left Ear: Tympanic membrane, ear canal and external ear normal.     Nose: Nose normal.     Mouth/Throat:     Mouth: Mucous membranes are moist.     Pharynx: Oropharynx is clear.  Eyes:     Conjunctiva/sclera: Conjunctivae normal.  Cardiovascular:     Rate and Rhythm: Normal rate and regular rhythm.     Heart sounds: Normal heart sounds.  Pulmonary:     Effort: Pulmonary effort is normal.     Breath sounds: Normal breath sounds.     Comments: Lungs clear to auscultation Musculoskeletal:     Cervical back: Neck supple.  Skin:    General: Skin is warm.     Comments: Hyperpigmented papules noted on right side of neck  Neurological:     Mental Status: She is alert and oriented to person, place, and time.  Psychiatric:        Mood and Affect: Mood normal.        Behavior: Behavior normal.        Thought Content: Thought content normal.        Judgment: Judgment normal.     Diagnostics:  FVC 1.78 L ( 74%), FEV1 1.34 ( 70%). Predicted FVC 2.40 L, predicted FEV1 1.2 L. Spirometry indicates possible mild restriction.  Assessment and Plan: 1. Mild persistent asthma without complication   2. Chronic urticaria   3. Non-seasonal allergic rhinitis due to other allergic trigger   4. Allergy with anaphylaxis due to food, subsequent encounter   5. Allergic conjunctivitis of both eyes     No orders of the defined types were placed in this encounter.   Patient Instructions  Allergic rhinitis with conjunctivitis (tree pollen and mold mix 2) -Continue Zyrtec (cetirizine) once a day as needed for runny nose/itching. (Unable to get carbinoxamine due to  cost per pharmacy. The pharmacy also does not carry Allegra. Has been on Zyrtec previously and was thought to be not effective.) -Continue Singulair 10 mg at night -Continue Atrovent 2 sprays each nostril twice a day (may use up to 3 times a day if needed for nasal drainage) -Continue olopatadine 0.2% eyedrops using 1 drop each eye once a day as  needed for itchy watery eyes  Mild persistent asthma -For asthma flares/upper respiratory tract infections start  Flovent 110 mcg 2 puffs twice a day with spacer for 1-2 weeks or until symptoms return to baseline. After you use up all of your Flovent you may use Arnuity  in place of the Flovent doing 1 puff once a day for 1-2 weeks during asthma flares/upper respiratory tract infections. -Continue Singulair 10 mg as above to help prevent cough and wheeze -May use Ventolin (albuterol) 2 puffs every 4 hours as needed for cough, wheeze, tightness in chest, shortness of breath.  Also, may use albuterol 2 puffs 5 to 15 minutes prior to exercise.  Asthma control goals:  Full participation in all desired activities (may need albuterol before activity) Albuterol use two time or less a week on average (not counting use with activity) Cough interfering with sleep two time or less a month Oral steroids no more than once a year No hospitalizations  Anaphylaxis due to food -Continue avoid  red meat, fish and shellfish. In case of an allergic reaction, give Benadryl 4 teaspoonfuls every 4 hours, and if life-threatening symptoms occur, inject with EpiPen 0.3 mg.  Urticaria (hives) -Increase Zyrtec (cetirizine) 10 mg twice a day.  Caution as this can make you sleepy - Please get additional blood work  that was ordered previously to follow-up on this.  We will call you with results once they are all back.  Please let us know if this treatment plan is not working well for you Schedule a follow-up appointment in 3 months with Dr. Nelva Bush or sooner if needed  Return  in about 3 months (around 05/01/2022), or if symptoms worsen or fail to improve.    Thank you for the opportunity to care for this patient.  Please do not hesitate to contact me with questions.  Althea Charon, FNP [facility]

## 2022-01-30 ENCOUNTER — Other Ambulatory Visit: Payer: Self-pay

## 2022-02-01 ENCOUNTER — Other Ambulatory Visit: Payer: Self-pay

## 2022-02-03 ENCOUNTER — Other Ambulatory Visit: Payer: Self-pay

## 2022-02-03 ENCOUNTER — Ambulatory Visit (HOSPITAL_COMMUNITY)
Admission: EM | Admit: 2022-02-03 | Discharge: 2022-02-03 | Disposition: A | Discharge status: Home / Self Care | Payer: No Typology Code available for payment source

## 2022-02-03 ENCOUNTER — Encounter (HOSPITAL_COMMUNITY): Payer: Self-pay | Admitting: Emergency Medicine

## 2022-02-03 DIAGNOSIS — H6091 Unspecified otitis externa, right ear: Secondary | ICD-10-CM

## 2022-02-03 DIAGNOSIS — H6121 Impacted cerumen, right ear: Secondary | ICD-10-CM

## 2022-02-03 MED ORDER — CIPROFLOXACIN-DEXAMETHASONE 0.3-0.1 % OT SUSP
4.0000 [drp] | Freq: Two times a day (BID) | OTIC | 0 refills | Status: DC
  Filled 2022-02-03: qty 7.5, 19d supply, fill #0

## 2022-02-03 NOTE — Discharge Instructions (Signed)
La irrigacin de la oreja fue un xito hoy.Se enviaron gotas para los odos de Ciprodex a la farmacia, coloque 4 gotas en el odo derecho, 2 veces al [employer] prximos 5 [place].  Puede hacer un seguimiento con su mdico de atencin primaria o con este consultorio si los sntomas no mejoran.

## 2022-02-03 NOTE — ED Triage Notes (Signed)
Right ear pain for 8 days.  No cough, no runny nose.    Patient has not used any medicines for ear pain

## 2022-02-03 NOTE — ED Provider Notes (Signed)
[facility]    CSN: [id number] Arrival date & time: 02/03/22  1019      History   Chief Complaint No chief complaint on file.   HPI Alexandra Brewer is a 66 y.o. female.  Patient complaining of right ear pain that started 8 days ago.  Patient denies any fever, chills, nasal congestion, or any other cold symptoms.  Patient denies any trauma to the ear.  Patient reports having muffled sounds.  Patient has not taken any medications for symptoms. Patient denies any history of previous ear problems. Spanish interpreter was used.   HPI  Past Medical History:  Diagnosis Date   Asthma    Constipation    Hyperlipemia    Hypertension    Hypothyroidism    Sciatica    Thyroid disease    Urticaria     Patient Active Problem List   Diagnosis Date Noted   Abdominal pain, epigastric 05/16/2021   Esophageal dysphagia 05/16/2021   Calculus of gallbladder without cholecystitis without obstruction 01/27/2021   Fatty liver 01/27/2021   Class 1 obesity due to excess calories with serious comorbidity and body mass index (BMI) of 31.0 to 31.9 in adult 01/27/2021   Long term (current) use of non-steroidal anti-inflammatories (nsaid) 01/12/2021   Migraine without aura and without status migrainosus, not intractable 05/31/2020   Abdominal pain 03/10/2018   Sciatica of right side 06/11/2017   RUQ abdominal pain 01/23/2017   GERD (gastroesophageal reflux disease) 11/26/2016   Right Achilles tendinitis 08/15/2016   Hypertension 07/05/2016   Adhesive capsulitis of right shoulder 02/21/2016   Heel pain, bilateral 01/10/2016   Varicose veins of left lower extremity with pain 01/10/2016   History of colonic polyps    Diverticulosis of colon without hemorrhage    Hypothyroidism 08/10/2015   Chronic constipation 08/05/2015   Hyperlipidemia 08/05/2015    Past Surgical History:  Procedure Laterality Date   BREAST BIOPSY Left 2018   FIBROCYSTIC CHANGES WITH FEATURES OF RUPTURE    CATARACT EXTRACTION     CHOLECYSTECTOMY N/A 05/26/2021   Procedure: LAPAROSCOPIC CHOLECYSTECTOMY;  Surgeon: Aviva Signs, MD;  Location: [facility];  Service: General;  Laterality: N/A;   COLONOSCOPY N/A 10/10/2015   Procedure: COLONOSCOPY;  Surgeon: Daneil Dolin, MD;  Location: [facility];  Service: Endoscopy;  Laterality: N/A;  245 - Interpreter scheduled, do NOT move   GALLBLADDER SURGERY  05/26/2021    OB History     Gravida  2   Para  1   Term  1   Preterm      AB  1   Living         SAB  1   IAB      Ectopic      Multiple      Live Births  1            Home Medications    Prior to Admission medications   Medication Sig Start Date End Date Taking? Authorizing Provider  ciprofloxacin-dexamethasone (CIPRODEX) OTIC suspension Place 4 drops into the right ear 2 (two) times daily. 02/03/22  Yes Flossie Dibble, NP  pantoprazole (PROTONIX) 40 MG tablet Take 40 mg by mouth daily.   Yes [provider]  albuterol (VENTOLIN HFA) 108 (90 Base) MCG/ACT inhaler Inhale 2 puffs into the lungs every 6 (six) hours as needed for wheezing or shortness of breath. must keep appt 12/12/21 for refills 11/07/21   Althea Charon, FNP  amLODipine (NORVASC) 5 MG  tablet Take 1 tablet (5 mg total) by mouth daily. 01/15/22   Ladell Pier, MD  cetirizine (ZYRTEC) 10 MG chewable tablet Chew 1 tablet (10 mg total) by mouth daily. 08/07/21   Althea Charon, FNP  EPINEPHrine 0.3 mg/0.3 mL IJ SOAJ injection Inject 0.3 mg into the muscle as needed for anaphylaxis. 08/07/21   Althea Charon, FNP  fluticasone (FLOVENT HFA) 110 MCG/ACT inhaler USE 2 PUFFS TWICE A DAY WITH SPACER TO HELP PREVENT COUGH AND WHEEZE 05/12/21 05/12/22  Althea Charon, FNP  Fluticasone Furoate (ARNUITY ELLIPTA) 100 MCG/ACT AEPB Inhale 1 puff into the lungs daily. 12/14/21   Althea Charon, FNP  ipratropium (ATROVENT) 0.06 % nasal spray Place 2 sprays into both nostrils in the morning and at bedtime. 08/07/21    Althea Charon, FNP  levothyroxine (SYNTHROID) 112 MCG tablet TAKE 1 TABLET (112 MCG TOTAL) BY MOUTH DAILY. 09/14/21   Ladell Pier, MD  meloxicam (MOBIC) 15 MG tablet Take 1 tablet (15 mg total) by mouth daily. 01/15/22   Ladell Pier, MD  montelukast (SINGULAIR) 10 MG tablet TAKE 1 TABLET (10 MG TOTAL) BY MOUTH AT BEDTIME. 03/06/21 03/06/22  Althea Charon, FNP  Olopatadine HCl 0.2 % SOLN Place 1 drop in each eye once a day as needed for itchy watery eyes 12/19/20   Althea Charon, FNP  pantoprazole (PROTONIX) 40 MG tablet Take 1 tablet (40 mg total) by mouth daily. 01/15/22   Ladell Pier, MD  triamcinolone cream (KENALOG) 0.1 % Apply 1 Application topically 2 (two) times daily. 09/14/21   Ladell Pier, MD  Wheat Dextrin (BENEFIBER) POWD Take by mouth daily. Two tablespoons    [provider]  fluticasone (FLONASE) 50 MCG/ACT nasal spray Place 2 sprays into both nostrils daily as needed for allergies or rhinitis. 07/06/19 03/17/20  Ladell Pier, MD    Family History Family History  Problem Relation Age of Onset   Allergic rhinitis Mother    Diabetes Father    Hypertension Father    Asthma Father    Stroke Maternal Grandmother    Stroke Maternal Grandfather    Colon cancer Neg Hx    Angioedema Neg Hx    Atopy Neg Hx    Immunodeficiency Neg Hx    Urticaria Neg Hx    Breast cancer Neg Hx     Social History Social History   Tobacco Use   Smoking status: Never   Smokeless tobacco: Never  Vaping Use   Vaping Use: Never used  Substance Use Topics   Alcohol use: No    Alcohol/week: 0.0 standard drinks of alcohol   Drug use: No     Allergies   Fish allergy, Naproxen, Shellfish allergy, and Penicillins   Review of Systems Review of Systems  Constitutional:  Negative for activity change, chills and fever.  HENT:  Positive for ear pain and tinnitus. Negative for congestion, dental problem, ear discharge, facial swelling, postnasal drip,  rhinorrhea, sinus pressure, sinus pain, sneezing, sore throat, trouble swallowing and voice change.        Muffled sounds in RT ear   Eyes: Negative.   Respiratory: Negative.    Cardiovascular: Negative.      Physical Exam Triage Vital Signs ED Triage Vitals  Enc Vitals Group     BP 02/03/22 1107 115/62     Pulse Rate 02/03/22 1107 77     Resp 02/03/22 1107 18     Temp 02/03/22 1107 98.2 F (36.8 C)  Temp Source 02/03/22 1107 Oral     SpO2 02/03/22 1107 98 %     Weight --      Height --      Head Circumference --      Peak Flow --      Pain Score 02/03/22 1100 5     Pain Loc --      Pain Edu? --      Excl. in [place]? --    No data found.  Updated Vital Signs BP 115/62   Pulse 77   Temp 98.2 F (36.8 C) (Oral)   Resp 18   SpO2 98%      Physical Exam Vitals and nursing note reviewed.  Constitutional:      Appearance: Normal appearance.  HENT:     Right Ear: External ear normal. Decreased hearing noted. Swelling present. There is impacted cerumen.     Left Ear: Hearing, tympanic membrane, ear canal and external ear normal.     Ears:     Comments: RT ear cerumen impaction upon initial assessment.   RT Ear irrigation was successful, Tympanic membrane appearance is normal after irrigation. Ear canal appearance is erythematous and swollen after irrigation.     Nose: Nose normal. No congestion or rhinorrhea.  Neurological:     Mental Status: She is alert.      UC Treatments / Results  Labs (all labs ordered are listed, but only abnormal results are displayed) Labs Reviewed - No data to display  EKG   Radiology No results found.  Procedures Procedures (including critical care time)  Medications Ordered in UC Medications - No data to display  Initial Impression / Assessment and Plan / UC Course  I have reviewed the triage vital signs and the nursing notes.  Pertinent labs & imaging results that were available during my care of the patient were  reviewed by me and considered in my medical decision making (see chart for details).     Patient was treated for cerumen impaction of the right ear and inflammation of RT ear canal.  Ear irrigation was performed by nurse staff in office and was successful.  Inflammation of the right ear canal noted after ear irrigation was performed . Ciprodex ear drops sent to pharmacy.  Patient made aware of medication regiment and timeline for resolution of symptoms.  Patient verbalized understanding of instructions.  Final Clinical Impressions(s) / UC Diagnoses   Final diagnoses:  Impacted cerumen of right ear  Inflammation of right ear canal     Discharge Instructions      La irrigacin de la oreja fue un xito hoy.Se enviaron gotas para los odos de Ciprodex a la farmacia, coloque 4 gotas en el odo derecho, 2 veces al [employer] prximos 5 [place].  Puede hacer un seguimiento con su mdico de atencin primaria o con este consultorio si los sntomas no mejoran.       ED Prescriptions     Medication Sig Dispense Auth. Provider   ciprofloxacin-dexamethasone (CIPRODEX) OTIC suspension Place 4 drops into the right ear 2 (two) times daily. 7.5 mL Flossie Dibble, NP      PDMP not reviewed this encounter.   Flossie Dibble, NP 02/03/22 1346

## 2022-02-05 ENCOUNTER — Other Ambulatory Visit: Payer: Self-pay

## 2022-02-08 ENCOUNTER — Ambulatory Visit
Admission: RE | Admit: 2022-02-08 | Discharge: 2022-02-08 | Disposition: A | Discharge status: Home / Self Care | Payer: No Typology Code available for payment source | Source: Ambulatory Visit | Attending: Obstetrics and Gynecology | Admitting: Obstetrics and Gynecology

## 2022-02-08 ENCOUNTER — Ambulatory Visit: Payer: Self-pay | Admitting: Hematology and Oncology

## 2022-02-08 VITALS — BP 120/88 | Wt 151.8 lb

## 2022-02-08 DIAGNOSIS — Z1231 Encounter for screening mammogram for malignant neoplasm of breast: Secondary | ICD-10-CM

## 2022-02-08 NOTE — Patient Instructions (Signed)
Taught Alexandra Brewer about breast self awareness. Patient did not need a Pap smear today due to last Pap smear was in 2020 per patient. Let her know [facility] will cover Pap smears every 5 years unless has a history of abnormal Pap smears. Referred patient to the [facility] for screening mammogram. Appointment scheduled for 02/08/2022. Patient aware of appointment and will be there. Let patient know will follow up with her within the next couple weeks with results. Alexandra Brewer verbalized understanding. She will return to clinic in one year for mammogram.   Melodye Ped, NP 12:17 PM

## 2022-02-08 NOTE — Progress Notes (Addendum)
Ms. Alexandra Brewer is a 66 y.o. female who presents to [facility] clinic today with no complaints.    Pap Smear: Pap not smear completed today. Last Pap smear was 03/06/2019 at [facility] and was normal. Per patient has no history of an abnormal Pap smear. Last Pap smear result is available in Epic.   Physical exam: Breasts Breasts symmetrical. No skin abnormalities bilateral breasts. No nipple retraction bilateral breasts. No nipple discharge bilateral breasts. No lymphadenopathy. No lumps palpated bilateral breasts.     MM 3D SCREEN BREAST BILATERAL  Result Date: 02/01/2021 CLINICAL DATA:  Screening. EXAM: DIGITAL SCREENING BILATERAL MAMMOGRAM WITH TOMOSYNTHESIS AND CAD TECHNIQUE: Bilateral screening digital craniocaudal and mediolateral oblique mammograms were obtained. Bilateral screening digital breast tomosynthesis was performed. The images were evaluated with computer-aided detection. COMPARISON:  Previous exam(s). ACR Breast Density Category b: There are scattered areas of fibroglandular density. FINDINGS: There are no findings suspicious for malignancy. IMPRESSION: No mammographic evidence of malignancy. A result letter of this screening mammogram will be mailed directly to the patient. RECOMMENDATION: Screening mammogram in one year. (Code:SM-B-01Y) BI-RADS CATEGORY  1: Negative. Electronically Signed   By: Lillia Mountain M.D.   On: 02/01/2021 11:33   MS DIGITAL SCREENING TOMO BILATERAL  Result Date: 05/04/2019 CLINICAL DATA:  Screening. EXAM: DIGITAL SCREENING BILATERAL MAMMOGRAM WITH TOMO AND CAD COMPARISON:  Previous exam(s). ACR Breast Density Category b: There are scattered areas of fibroglandular density. FINDINGS: There are no findings suspicious for malignancy. Images were processed with CAD. IMPRESSION: No mammographic evidence of malignancy. A result letter of this screening mammogram will be mailed directly to the patient. RECOMMENDATION: Screening mammogram in one year. (Code:SM-B-01Y)  BI-RADS CATEGORY  1: Negative. Electronically Signed   By: Claudie Revering M.D.   On: 05/04/2019 17:15      Pelvic/Bimanual Pap is not indicated today    Smoking History: Patient has never smoked and was not referred to quit line.    Patient Navigation: Patient education provided. Access to services provided for patient through [place] interpreter provided. No transportation provided   Colorectal Cancer Screening: Per patient has had colonoscopy completed on 10/10/2015 with benign polyps.  No complaints today.    Breast and Cervical Cancer Risk Assessment: Patient does not have family history of breast cancer, known genetic mutations, or radiation treatment to the chest before age 69. Patient does not have history of cervical dysplasia, immunocompromised, or DES exposure in-utero.  Risk Assessment   No risk assessment data for the current encounter  Risk Scores       01/31/2021   Last edited by: Demetrius Revel, LPN   5-year risk: 1.1 %   Lifetime risk: 4.9 %            A: [facility] exam without pap smear No complaints with benign exam.   P: Referred patient to the [facility] for a screening mammogram. Appointment scheduled 02/08/2022.  Dayton Scrape A, NP 02/08/2022 11:30 AM

## 2022-02-27 ENCOUNTER — Other Ambulatory Visit: Payer: Self-pay

## 2022-03-05 ENCOUNTER — Other Ambulatory Visit: Payer: Self-pay

## 2022-03-08 ENCOUNTER — Other Ambulatory Visit: Payer: Self-pay

## 2022-03-16 ENCOUNTER — Other Ambulatory Visit: Payer: Self-pay

## 2022-03-27 ENCOUNTER — Other Ambulatory Visit: Payer: Self-pay

## 2022-04-03 ENCOUNTER — Other Ambulatory Visit: Payer: Self-pay

## 2022-04-09 ENCOUNTER — Ambulatory Visit: Payer: Self-pay | Admitting: *Deleted

## 2022-04-09 NOTE — Telephone Encounter (Signed)
  Chief Complaint: urinary pressure Symptoms: pressure with urination, mild discomfort- last time was seen at ED for this- diagnosed UTI Frequency: 1 month Pertinent Negatives: Patient denies blood in urine, fever, flank pain Disposition: '[]'$ ED /'[]'$ Urgent Care (no appt availability in office) / '[]'$ Appointment(In office/virtual)/ '[]'$  [facility]/ '[]'$ Home Care/ '[]'$ Refused Recommended Disposition /'[x]'$ [facility]/ '[]'$  Follow-up with PCP Additional Notes: Scheduled with mobile provider

## 2022-04-09 NOTE — Telephone Encounter (Signed)
Summary: difficulty urinating/unable to fully empty her bladder.   Pt stated is having difficulty urinating pt stated for about a month. Pt denied abdominal pain. Pt stated unable to fully empty her bladder.  Pt seeking clinical advice. Please return pt call with a Administrator, sports.      Interpreter: [id number] Reason for Disposition  All other urine symptoms  Answer Assessment - Initial Assessment Questions 1. SYMPTOM: "What's the main symptom you're concerned about?" (e.g., frequency, incontinence)     Urination pressure, slight pain 2. ONSET: "When did the  pressure  start?"     1 month 3. PAIN: "Is there any pain?" If Yes, ask: "How bad is it?" (Scale: 1-10; mild, moderate, severe)     More pressure- than pain 4. CAUSE: "What do you think is causing the symptoms?"     unsure 5. OTHER SYMPTOMS: "Do you have any other symptoms?" (e.g., blood in urine, fever, flank pain, pain with urination)     No other symptoms  Protocols used: Urinary Symptoms-A-AH

## 2022-04-10 ENCOUNTER — Ambulatory Visit: Payer: Self-pay | Admitting: Physician Assistant

## 2022-04-30 ENCOUNTER — Other Ambulatory Visit: Payer: Self-pay

## 2022-05-02 ENCOUNTER — Other Ambulatory Visit: Payer: Self-pay

## 2022-05-02 ENCOUNTER — Encounter: Payer: Self-pay | Admitting: Allergy

## 2022-05-02 ENCOUNTER — Ambulatory Visit (INDEPENDENT_AMBULATORY_CARE_PROVIDER_SITE_OTHER): Payer: No Typology Code available for payment source | Admitting: Allergy

## 2022-05-02 VITALS — BP 120/70 | HR 63 | Temp 98.7°F | Resp 16

## 2022-05-02 DIAGNOSIS — T7800XD Anaphylactic reaction due to unspecified food, subsequent encounter: Secondary | ICD-10-CM

## 2022-05-02 DIAGNOSIS — L508 Other urticaria: Secondary | ICD-10-CM

## 2022-05-02 DIAGNOSIS — J3089 Other allergic rhinitis: Secondary | ICD-10-CM

## 2022-05-02 DIAGNOSIS — H1013 Acute atopic conjunctivitis, bilateral: Secondary | ICD-10-CM

## 2022-05-02 DIAGNOSIS — J4530 Mild persistent asthma, uncomplicated: Secondary | ICD-10-CM

## 2022-05-02 MED ORDER — MONTELUKAST SODIUM 10 MG PO TABS
10.0000 mg | ORAL_TABLET | Freq: Every day | ORAL | 5 refills | Status: DC
  Filled 2022-05-02: qty 30, 30d supply, fill #0

## 2022-05-02 MED ORDER — IPRATROPIUM BROMIDE 0.06 % NA SOLN
2.0000 | Freq: Two times a day (BID) | NASAL | 5 refills | Status: DC
  Filled 2022-05-02: qty 15, 38d supply, fill #0

## 2022-05-02 MED ORDER — OLOPATADINE HCL 0.2 % OP SOLN
OPHTHALMIC | 5 refills | Status: DC
  Filled 2022-05-02: qty 2.5, fill #0

## 2022-05-02 MED ORDER — ALBUTEROL SULFATE HFA 108 (90 BASE) MCG/ACT IN AERS
2.0000 | INHALATION_SPRAY | Freq: Four times a day (QID) | RESPIRATORY_TRACT | 1 refills | Status: DC | PRN
  Filled 2022-05-02: qty 6.7, 25d supply, fill #0

## 2022-05-02 MED ORDER — ARNUITY ELLIPTA 100 MCG/ACT IN AEPB
1.0000 | INHALATION_SPRAY | Freq: Every day | RESPIRATORY_TRACT | 5 refills | Status: DC
  Filled 2022-05-02: qty 30, fill #0

## 2022-05-02 MED ORDER — LEVOCETIRIZINE DIHYDROCHLORIDE 5 MG PO TABS
5.0000 mg | ORAL_TABLET | Freq: Every evening | ORAL | 5 refills | Status: DC
  Filled 2022-05-02: qty 30, 30d supply, fill #0

## 2022-05-02 MED ORDER — EPINEPHRINE 0.3 MG/0.3ML IJ SOAJ
0.3000 mg | INTRAMUSCULAR | 1 refills | Status: DC | PRN
  Filled 2022-05-02: qty 2, 30d supply, fill #0
  Filled 2022-05-02: qty 2, fill #0
  Filled 2022-05-29: qty 2, 30d supply, fill #0

## 2022-05-02 NOTE — Patient Instructions (Addendum)
Allergic rhinitis with conjunctivitis (tree pollen and mold mix 2) -Change Zyrtec to Xyzal '5mg'$ .   Let us know if Xyzal works better than Zyrtec.  Samples provided.   -Continue Singulair 10 mg at night -Continue Atrovent 2 sprays each nostril twice a day (may use up to 3 times a day if needed for nasal drainage) -Continue olopatadine 0.2% eyedrops using 1 drop each eye once a day as needed for itchy watery eyes  Mild persistent asthma -For asthma flares/upper respiratory tract infections start  Arnuity 1 puff once a day for 1-2 weeks or until symptoms return to baseline.  -Continue Singulair 10 mg as above to help prevent cough and wheeze -May use Ventolin (albuterol) 2 puffs every 4 hours as needed for cough, wheeze, tightness in chest, shortness of breath.  Also, may use albuterol 2 puffs 5 to 15 minutes prior to exercise.  Asthma control goals:  Full participation in all desired activities (may need albuterol before activity) Albuterol use two time or less a week on average (not counting use with activity) Cough interfering with sleep two time or less a month Oral steroids no more than once a year No hospitalizations  Anaphylaxis due to food -Continue avoid  red meat, fish and shellfish. In case of an allergic reaction, give Benadryl 4 teaspoonfuls every 4 hours, and if life-threatening symptoms occur, inject with EpiPen 0.3 mg.  Urticaria (hives) -doing well.  If hives return then take Xyzal 1 tab twice a day until improved  Follow-up appointment in 4-6 months sooner if needed

## 2022-05-02 NOTE — Progress Notes (Signed)
Follow-up Note  RE: Alexandra Brewer MRN: [id number] DOB: January 16, 1956 Date of Office Visit: 05/02/2022   History of present illness: Alexandra Brewer is a 68 y.o. female presenting today for follow-up of allergic rhinitis with conjunctivitis, asthma, hives, food allergy.  She was last seen in the office on 01/29/22 by our nurse practitioner Quita Skye. Spanish interpreter present today for translation.  She states she has been doing well.    She denies any recent illnesses or asthma flare.   She states she does use albuterol as needed but mostly states will use daily if having symptoms until its controlled again.  She recalls using albuterol last about 15 days ago.   She states the flovent was switched to [place] but she has only used it once since she has received it.  She continues to take singulair at night and does find it helpful.  She has not required any ED/UC visits or systemic steroid needs since last visit.   She does report a lot of allergy symptoms with the cold weather.  She reports watery eyes, headache mainly.  She states she uses olopatadine usually daily for watery eyes and it does help.  She uses atrovent daily for nasal drainage and it does help. She also takes zyrtec daily and does not feel it helps.  The carbinoaxmine was going to be too costly and her pharmacy didn't carry [place].  She does not believe she has tried xyzal.    She continues to avoid red meat, fish and shellfish.  She has access to epipen but has not any need to use.    She has not had any hives since last visit.   Review of systems: Review of Systems  Constitutional: Negative.   HENT: Negative.    Eyes:        See HPI  Respiratory: Negative.    Cardiovascular: Negative.   Gastrointestinal: Negative.   Musculoskeletal: Negative.   Skin: Negative.   Allergic/Immunologic: Negative.   Neurological:  Positive for headaches.     All other systems negative unless noted above in HPI  Past  medical/social/surgical/family history have been reviewed and are unchanged unless specifically indicated below.  No changes  Medication List: Current Outpatient Medications  Medication Sig Dispense Refill   amLODipine (NORVASC) 5 MG tablet Take 1 tablet (5 mg total) by mouth daily. 90 tablet 2   cetirizine (ZYRTEC) 10 MG chewable tablet Chew 1 tablet (10 mg total) by mouth daily. 30 tablet 5   levocetirizine (XYZAL) 5 MG tablet Take 1 tablet (5 mg total) by mouth every evening. 30 tablet 5   levothyroxine (SYNTHROID) 112 MCG tablet TAKE 1 TABLET (112 MCG TOTAL) BY MOUTH DAILY. 90 tablet 2   meloxicam (MOBIC) 15 MG tablet Take 1 tablet (15 mg total) by mouth daily. 30 tablet 2   pantoprazole (PROTONIX) 40 MG tablet Take 1 tablet (40 mg total) by mouth daily. 30 tablet 3   Wheat Dextrin (BENEFIBER) POWD Take by mouth daily. Two tablespoons     albuterol (VENTOLIN HFA) 108 (90 Base) MCG/ACT inhaler Inhale 2 puffs into the lungs every 6 (six) hours as needed for wheezing or shortness of breath. must keep appt 12/12/21 for refills 18 g 1   EPINEPHrine 0.3 mg/0.3 mL IJ SOAJ injection Inject 0.3 mg into the muscle as needed for anaphylaxis. 2 each 1   Fluticasone Furoate (ARNUITY ELLIPTA) 100 MCG/ACT AEPB Inhale 1 puff into the lungs daily. 30 each 5   ipratropium (  ATROVENT) 0.06 % nasal spray Place 2 sprays into both nostrils in the morning and at bedtime. 15 mL 5   montelukast (SINGULAIR) 10 MG tablet TAKE 1 TABLET (10 MG TOTAL) BY MOUTH AT BEDTIME. 30 tablet 5   Olopatadine HCl 0.2 % SOLN Place 1 drop in each eye once a day as needed for itchy watery eyes 2.5 mL 5   triamcinolone cream (KENALOG) 0.1 % Apply 1 Application topically 2 (two) times daily. (Patient not taking: Reported on 05/02/2022) 30 g 0   No current facility-administered medications for this visit.     Known medication allergies: Allergies  Allergen Reactions   Fish Allergy     Throat swell up   Naproxen Rash   Shellfish  Allergy     Throat swell up   Penicillins Hives     Physical examination: Blood pressure 120/70, pulse 63, temperature 98.7 F (37.1 C), temperature source Temporal, resp. rate 16, SpO2 100 %.  General: Alert, interactive, in no acute distress. HEENT: PERRLA, TMs pearly gray, turbinates minimally edematous without discharge, post-pharynx non erythematous. Neck: Supple without lymphadenopathy. Lungs: Clear to auscultation without wheezing, rhonchi or rales. {no increased work of breathing. CV: Normal S1, S2 without murmurs. Abdomen: Nondistended, nontender. Skin: Warm and dry, without lesions or rashes. Extremities:  No clubbing, cyanosis or edema. Neuro:   Grossly intact.  Diagnositics/Labs: None today  Assessment and plan:   Allergic rhinitis with conjunctivitis (tree pollen and mold mix 2) -Change Zyrtec to Xyzal '5mg'$ .   Let us know if Xyzal works better than Zyrtec.  Samples provided.   -Continue Singulair 10 mg at night -Continue Atrovent 2 sprays each nostril twice a day (may use up to 3 times a day if needed for nasal drainage) -Continue olopatadine 0.2% eyedrops using 1 drop each eye once a day as needed for itchy watery eyes  Mild persistent asthma -For asthma flares/upper respiratory tract infections start  Arnuity 1 puff once a day for 1-2 weeks or until symptoms return to baseline.  -Continue Singulair 10 mg as above to help prevent cough and wheeze -May use Ventolin (albuterol) 2 puffs every 4 hours as needed for cough, wheeze, tightness in chest, shortness of breath.  Also, may use albuterol 2 puffs 5 to 15 minutes prior to exercise.  Asthma control goals:  Full participation in all desired activities (may need albuterol before activity) Albuterol use two time or less a week on average (not counting use with activity) Cough interfering with sleep two time or less a month Oral steroids no more than once a year No hospitalizations  Anaphylaxis due to  food -Continue avoid  red meat, fish and shellfish. In case of an allergic reaction, give Benadryl 4 teaspoonfuls every 4 hours, and if life-threatening symptoms occur, inject with EpiPen 0.3 mg.  Urticaria (hives) -doing well.  If hives return then take Xyzal 1 tab twice a day until improved  Follow-up appointment in 4-6 months sooner if needed  I appreciate the opportunity to take part in Shamikia's care. Please do not hesitate to contact me with questions.  Sincerely,   Prudy Feeler, MD Allergy/Immunology [facility]

## 2022-05-04 ENCOUNTER — Other Ambulatory Visit: Payer: Self-pay

## 2022-05-15 ENCOUNTER — Other Ambulatory Visit: Payer: Self-pay

## 2022-05-17 ENCOUNTER — Other Ambulatory Visit: Payer: Self-pay

## 2022-05-18 ENCOUNTER — Telehealth: Payer: Self-pay | Admitting: Gastroenterology

## 2022-05-18 ENCOUNTER — Encounter: Payer: Self-pay | Admitting: Gastroenterology

## 2022-05-18 ENCOUNTER — Ambulatory Visit (INDEPENDENT_AMBULATORY_CARE_PROVIDER_SITE_OTHER): Payer: Self-pay | Admitting: Gastroenterology

## 2022-05-18 ENCOUNTER — Encounter: Payer: Self-pay | Admitting: *Deleted

## 2022-05-18 VITALS — BP 129/73 | HR 71 | Temp 97.9°F | Ht 61.5 in | Wt 155.0 lb

## 2022-05-18 DIAGNOSIS — R1319 Other dysphagia: Secondary | ICD-10-CM

## 2022-05-18 DIAGNOSIS — K219 Gastro-esophageal reflux disease without esophagitis: Secondary | ICD-10-CM

## 2022-05-18 DIAGNOSIS — R1011 Right upper quadrant pain: Secondary | ICD-10-CM

## 2022-05-18 DIAGNOSIS — K5909 Other constipation: Secondary | ICD-10-CM

## 2022-05-18 DIAGNOSIS — R7989 Other specified abnormal findings of blood chemistry: Secondary | ICD-10-CM | POA: Insufficient documentation

## 2022-05-18 NOTE — Patient Instructions (Addendum)
Continue pantoprazole 67m daily before breakfast.  We will find out which constipation medication will be available to you based on your insurance coverage. we will be in touch.   you have history of abnormal liver labs. We need for you to have additional labs to further evaluate.   Upper endoscopy to be scheduled. See separate instructions.     Contine con pantoprazol 40 mg al da antes del desayuno.  Descubriremos qu medicamentos para el estreimiento estarn disponibles para usted segn su cobertura de seguro. estaremos en contacto.  tiene antecedentes de laboratorios hepticos anormales. Necesitamos que tenga laboratorios adicionales para realizar ms evaluaciones.  Endoscopia superior por programar. Vea instrucciones separadas.

## 2022-05-18 NOTE — Progress Notes (Signed)
GI Office Note    Referring Provider: Ladell Pier, MD Primary Care Physician:  Ladell Pier, MD  Primary Gastroenterologist: Garfield Cornea, MD   Chief Complaint   Chief Complaint  Patient presents with   Follow-up         History of Present Illness   Alexandra Brewer is a 67 y.o. female presenting today for yearly follow-up.  History of GERD and chronic constipation. Patient last seen in 05/2021. Presents with Spanish Interpretor today.   Patient states she continues to have stomach problems like before. She remains on pantoprazole 49m daily. She does not have heartburn. She has a lot of belching. She complains of early satiety and bloating. Feels like her food does not digest well. She complains of dysphagia to certain foods, typically fatty foods, meat. Notes increased problems with coffee, caffeine.   She complains of RUQ pain when she is not able to have a good BM. She used to be on Linzess but states she is no longer able to get it. It is not clear what type of medical coverage she has or if she has patient assistance. Currently taking benefiber 2 tablespoons daily. BM every day to every other day but not production. No melena, brbpr.   Patient states after having her gb out in 05/2021, she has had more stomach issues.   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 PCP advised her to avoid red meats and prescribed Epi-Pen. Patient has been consume some beef. She did not understand what "red meat" meant.    History of mildly elevated AlkPhos dating back to 2020.   H. pylori serologies October 2022 were negative.  No prior EGD.  Abdominal ultrasound October 2022 for right-sided abdominal pain showed stones and sludge in the gallbladder but no gallbladder wall thickening or pericholecystic fluid.  Hepatic steatosis suspected.  LFTs at that time with mildly elevated alkaline phosphatase of 129.   Colonoscopy 10/2015: - One 3 mm  polyp in the cecum, removed with a cold snare. Resected and retrieved. - Diverticulosis in the entire examined colon. - Internal hemorrhoids. -benign colonic mucosa -next colonoscopy in 09/2025    Medications   Current Outpatient Medications  Medication Sig Dispense Refill   albuterol (VENTOLIN HFA) 108 (90 Base) MCG/ACT inhaler Inhale 2 puffs into the lungs every 6 (six) hours as needed for wheezing or shortness of breath. 6.7 g 1   amLODipine (NORVASC) 5 MG tablet Take 1 tablet (5 mg total) by mouth daily. 90 tablet 2   cetirizine (ZYRTEC) 10 MG chewable tablet Chew 1 tablet (10 mg total) by mouth daily. 30 tablet 5   EPINEPHrine 0.3 mg/0.3 mL IJ SOAJ injection Inject 0.3 mg into the muscle as needed for anaphylaxis. 2 each 1   Fluticasone Furoate (ARNUITY ELLIPTA) 100 MCG/ACT AEPB Inhale 1 puff into the lungs daily. 30 each 5   ipratropium (ATROVENT) 0.06 % nasal spray Place 2 sprays into both nostrils in the morning and at bedtime. 15 mL 5   levothyroxine (SYNTHROID) 112 MCG tablet TAKE 1 TABLET (112 MCG TOTAL) BY MOUTH DAILY. 90 tablet 2   meloxicam (MOBIC) 15 MG tablet Take 1 tablet (15 mg total) by mouth daily. 30 tablet 2   montelukast (SINGULAIR) 10 MG tablet Take 1 tablet (10 mg total) by mouth at bedtime. 30 tablet 5   pantoprazole (PROTONIX) 40 MG tablet Take 1 tablet (40 mg total) by mouth daily. 30 tablet 3  Wheat Dextrin (BENEFIBER) POWD Take by mouth daily. Two tablespoons     No current facility-administered medications for this visit.    Allergies   Allergies as of 05/18/2022 - Review Complete 05/18/2022  Allergen Reaction Noted   Fish allergy  06/11/2017   Naproxen Rash 06/29/2019   Shellfish allergy  06/11/2017   Penicillins Hives 07/20/2015     Past Medical History   Past Medical History:  Diagnosis Date   Asthma    Constipation    Hyperlipemia    Hypertension    Hypothyroidism    Sciatica    Thyroid disease    Urticaria     Past Surgical  History   Past Surgical History:  Procedure Laterality Date   BREAST BIOPSY Left 2018   FIBROCYSTIC CHANGES WITH FEATURES OF RUPTURE   CATARACT EXTRACTION     CHOLECYSTECTOMY N/A 05/26/2021   Procedure: LAPAROSCOPIC CHOLECYSTECTOMY;  Surgeon: Aviva Signs, MD;  Location: [facility];  Service: General;  Laterality: N/A;   COLONOSCOPY N/A 10/10/2015   one 25m polyp removed (benign colon mucosa), diverticulosis, internal hemorrhoids, next colonoscopy 09/2025. Procedure: COLONOSCOPY;  Surgeon: RDaneil Dolin MD;  Location: [facility];  Service: Endoscopy;  Laterality: N/A;  224- Interpreter scheduled, do NOT move   GALLBLADDER SURGERY  05/26/2021    Past Family History   Family History  Problem Relation Age of Onset   Allergic rhinitis Mother    Diabetes Father    Hypertension Father    Asthma Father    Stroke Maternal Grandmother    Stroke Maternal Grandfather    Colon cancer Neg Hx    Angioedema Neg Hx    Atopy Neg Hx    Immunodeficiency Neg Hx    Urticaria Neg Hx    Breast cancer Neg Hx     Past Social History   Social History   Socioeconomic History   Marital status: Married    Spouse name: Not on file   Number of children: 1   Years of education: Not on file   Highest education level: 3rd grade  Occupational History   Not on file  Tobacco Use   Smoking status: Never   Smokeless tobacco: Never  Vaping Use   Vaping Use: Never used  Substance and Sexual Activity   Alcohol use: No    Alcohol/week: 0.0 standard drinks of alcohol   Drug use: No   Sexual activity: Yes    Birth control/protection: Post-menopausal  Other Topics Concern   Not on file  Social History Narrative   Not on file   Social Determinants of Health   Financial Resource Strain: Not on file  Food Insecurity: No Food Insecurity (02/08/2022)   Hunger Vital Sign    Worried About Running Out of Food in the Last Year: Never true    Ran Out of Food in the Last Year: Never true  Transportation  Needs: Unmet Transportation Needs (02/08/2022)   PRAPARE - THydrologist(Medical): Yes    Lack of Transportation (Non-Medical): Yes  Physical Activity: Not on file  Stress: Not on file  Social Connections: Not on file  Intimate Partner Violence: Not on file    Review of Systems   General: Negative for anorexia, weight loss, fever, chills, fatigue, weakness. ENT: Negative for hoarseness, difficulty swallowing , nasal congestion. CV: Negative for chest pain, angina, palpitations, dyspnea on exertion, peripheral edema.  Respiratory: Negative for dyspnea at rest, dyspnea on exertion, cough, sputum, wheezing.  GI: See history of present illness. GU:  Negative for dysuria, hematuria, urinary incontinence, urinary frequency, nocturnal urination.  Endo: Negative for unusual weight change.     Physical Exam   BP 129/73 (BP Location: Right Arm, Patient Position: Sitting, Cuff Size: Normal)   Pulse 71   Temp 97.9 F (36.6 C) (Oral)   Ht 5' 1.5" (1.562 m)   Wt 155 lb (70.3 kg)   SpO2 97%   BMI 28.81 kg/m    General: Well-nourished, well-developed in no acute distress.  Eyes: No icterus. Mouth: Oropharyngeal mucosa moist and pink   Lungs: Clear to auscultation bilaterally.  Heart: Regular rate and rhythm, no murmurs rubs or gallops.  Abdomen: Bowel sounds are normal, nontender, nondistended, no hepatosplenomegaly or masses,  no abdominal bruits or hernia , no rebound or guarding.  Rectal: not performed Extremities: No lower extremity edema. No clubbing or deformities. Neuro: Alert and oriented x 4   Skin: Warm and dry, no jaundice.   Psych: Alert and cooperative, normal mood and affect.  Labs  See hpi Imaging Studies   No results found.  Assessment   GERD/dysphagia: typical heartburn controlled on pantoprazole. She complains of some solid food dysphagia. Has early satiety and bloating with meals. She is on NSAIDs. Recommend EGD to evaluate for  gastritis/ulcers/complicated gerd.   Constipation: not adequately managed on fiber daily. Previously did well on Linzess but states she was unable to get at pharmacy, ?lack of coverage.  RUQ pain: worse with constipation.   Elevated LFTs: chronically elevated alk phos.    PLAN   CMET, GGT, AMA, CBC Will find out what is available for constipation with her coverage.  EGD/ED with Dr. Gala Romney. ASA 2.  I have discussed the risks, alternatives, benefits with regards to but not limited to the risk of reaction to medication, bleeding, infection, perforation and the patient is agreeable to proceed. Written consent to be obtained. Continue pantoprazole 33m daily.   LLaureen Ochs LBobby Rumpf [facility] [facility]

## 2022-05-18 NOTE — H&P (View-Only) (Signed)
   GI Office Note    Referring Provider: Johnson, Deborah B, MD Primary Care Physician:  Johnson, Deborah B, MD  Primary Gastroenterologist: Michael Rourk, MD   Chief Complaint   Chief Complaint  Patient presents with   Follow-up         History of Present Illness   Alexandra Brewer is a 67 y.o. female presenting today for yearly follow-up.  History of GERD and chronic constipation. Patient last seen in 05/2021. Presents with Spanish Interpretor today.   Patient states she continues to have stomach problems like before. She remains on pantoprazole 40mg daily. She does not have heartburn. She has a lot of belching. She complains of early satiety and bloating. Feels like her food does not digest well. She complains of dysphagia to certain foods, typically fatty foods, meat. Notes increased problems with coffee, caffeine.   She complains of RUQ pain when she is not able to have a good BM. She used to be on Linzess but states she is no longer able to get it. It is not clear what type of medical coverage she has or if she has patient assistance. Currently taking benefiber 2 tablespoons daily. BM every day to every other day but not production. No melena, brbpr.   Patient states after having her gb out in 05/2021, she has had more stomach issues.   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 PCP advised her to avoid red meats and prescribed Epi-Pen. Patient has been consume some beef. She did not understand what "red meat" meant.    History of mildly elevated AlkPhos dating back to 2020.   H. pylori serologies October 2022 were negative.  No prior EGD.  Abdominal ultrasound October 2022 for right-sided abdominal pain showed stones and sludge in the gallbladder but no gallbladder wall thickening or pericholecystic fluid.  Hepatic steatosis suspected.  LFTs at that time with mildly elevated alkaline phosphatase of 129.   Colonoscopy 10/2015: - One 3 mm  polyp in the cecum, removed with a cold snare. Resected and retrieved. - Diverticulosis in the entire examined colon. - Internal hemorrhoids. -benign colonic mucosa -next colonoscopy in 09/2025    Medications   Current Outpatient Medications  Medication Sig Dispense Refill   albuterol (VENTOLIN HFA) 108 (90 Base) MCG/ACT inhaler Inhale 2 puffs into the lungs every 6 (six) hours as needed for wheezing or shortness of breath. 6.7 g 1   amLODipine (NORVASC) 5 MG tablet Take 1 tablet (5 mg total) by mouth daily. 90 tablet 2   cetirizine (ZYRTEC) 10 MG chewable tablet Chew 1 tablet (10 mg total) by mouth daily. 30 tablet 5   EPINEPHrine 0.3 mg/0.3 mL IJ SOAJ injection Inject 0.3 mg into the muscle as needed for anaphylaxis. 2 each 1   Fluticasone Furoate (ARNUITY ELLIPTA) 100 MCG/ACT AEPB Inhale 1 puff into the lungs daily. 30 each 5   ipratropium (ATROVENT) 0.06 % nasal spray Place 2 sprays into both nostrils in the morning and at bedtime. 15 mL 5   levothyroxine (SYNTHROID) 112 MCG tablet TAKE 1 TABLET (112 MCG TOTAL) BY MOUTH DAILY. 90 tablet 2   meloxicam (MOBIC) 15 MG tablet Take 1 tablet (15 mg total) by mouth daily. 30 tablet 2   montelukast (SINGULAIR) 10 MG tablet Take 1 tablet (10 mg total) by mouth at bedtime. 30 tablet 5   pantoprazole (PROTONIX) 40 MG tablet Take 1 tablet (40 mg total) by mouth daily. 30 tablet 3     Wheat Dextrin (BENEFIBER) POWD Take by mouth daily. Two tablespoons     No current facility-administered medications for this visit.    Allergies   Allergies as of 05/18/2022 - Review Complete 05/18/2022  Allergen Reaction Noted   Fish allergy  06/11/2017   Naproxen Rash 06/29/2019   Shellfish allergy  06/11/2017   Penicillins Hives 07/20/2015     Past Medical History   Past Medical History:  Diagnosis Date   Asthma    Constipation    Hyperlipemia    Hypertension    Hypothyroidism    Sciatica    Thyroid disease    Urticaria     Past Surgical  History   Past Surgical History:  Procedure Laterality Date   BREAST BIOPSY Left 2018   FIBROCYSTIC CHANGES WITH FEATURES OF RUPTURE   CATARACT EXTRACTION     CHOLECYSTECTOMY N/A 05/26/2021   Procedure: LAPAROSCOPIC CHOLECYSTECTOMY;  Surgeon: Jenkins, Mark, MD;  Location: [facility];  Service: General;  Laterality: N/A;   COLONOSCOPY N/A 10/10/2015   one 3mm polyp removed (benign colon mucosa), diverticulosis, internal hemorrhoids, next colonoscopy 09/2025. Procedure: COLONOSCOPY;  Surgeon: Robert M Rourk, MD;  Location: [facility];  Service: Endoscopy;  Laterality: N/A;  245 - Interpreter scheduled, do NOT move   GALLBLADDER SURGERY  05/26/2021    Past Family History   Family History  Problem Relation Age of Onset   Allergic rhinitis Mother    Diabetes Father    Hypertension Father    Asthma Father    Stroke Maternal Grandmother    Stroke Maternal Grandfather    Colon cancer Neg Hx    Angioedema Neg Hx    Atopy Neg Hx    Immunodeficiency Neg Hx    Urticaria Neg Hx    Breast cancer Neg Hx     Past Social History   Social History   Socioeconomic History   Marital status: Married    Spouse name: Not on file   Number of children: 1   Years of education: Not on file   Highest education level: 3rd grade  Occupational History   Not on file  Tobacco Use   Smoking status: Never   Smokeless tobacco: Never  Vaping Use   Vaping Use: Never used  Substance and Sexual Activity   Alcohol use: No    Alcohol/week: 0.0 standard drinks of alcohol   Drug use: No   Sexual activity: Yes    Birth control/protection: Post-menopausal  Other Topics Concern   Not on file  Social History Narrative   Not on file   Social Determinants of Health   Financial Resource Strain: Not on file  Food Insecurity: No Food Insecurity (02/08/2022)   Hunger Vital Sign    Worried About Running Out of Food in the Last Year: Never true    Ran Out of Food in the Last Year: Never true  Transportation  Needs: Unmet Transportation Needs (02/08/2022)   PRAPARE - Transportation    Lack of Transportation (Medical): Yes    Lack of Transportation (Non-Medical): Yes  Physical Activity: Not on file  Stress: Not on file  Social Connections: Not on file  Intimate Partner Violence: Not on file    Review of Systems   General: Negative for anorexia, weight loss, fever, chills, fatigue, weakness. ENT: Negative for hoarseness, difficulty swallowing , nasal congestion. CV: Negative for chest pain, angina, palpitations, dyspnea on exertion, peripheral edema.  Respiratory: Negative for dyspnea at rest, dyspnea on exertion, cough, sputum, wheezing.    GI: See history of present illness. GU:  Negative for dysuria, hematuria, urinary incontinence, urinary frequency, nocturnal urination.  Endo: Negative for unusual weight change.     Physical Exam   BP 129/73 (BP Location: Right Arm, Patient Position: Sitting, Cuff Size: Normal)   Pulse 71   Temp 97.9 F (36.6 C) (Oral)   Ht 5' 1.5" (1.562 m)   Wt 155 lb (70.3 kg)   SpO2 97%   BMI 28.81 kg/m    General: Well-nourished, well-developed in no acute distress.  Eyes: No icterus. Mouth: Oropharyngeal mucosa moist and pink   Lungs: Clear to auscultation bilaterally.  Heart: Regular rate and rhythm, no murmurs rubs or gallops.  Abdomen: Bowel sounds are normal, nontender, nondistended, no hepatosplenomegaly or masses,  no abdominal bruits or hernia , no rebound or guarding.  Rectal: not performed Extremities: No lower extremity edema. No clubbing or deformities. Neuro: Alert and oriented x 4   Skin: Warm and dry, no jaundice.   Psych: Alert and cooperative, normal mood and affect.  Labs  See hpi Imaging Studies   No results found.  Assessment   GERD/dysphagia: typical heartburn controlled on pantoprazole. She complains of some solid food dysphagia. Has early satiety and bloating with meals. She is on NSAIDs. Recommend EGD to evaluate for  gastritis/ulcers/complicated gerd.   Constipation: not adequately managed on fiber daily. Previously did well on Linzess but states she was unable to get at pharmacy, ?lack of coverage.  RUQ pain: worse with constipation.   Elevated LFTs: chronically elevated alk phos.    PLAN   CMET, GGT, AMA, CBC Will find out what is available for constipation with her coverage.  EGD/ED with Dr. Rourk. ASA 2.  I have discussed the risks, alternatives, benefits with regards to but not limited to the risk of reaction to medication, bleeding, infection, perforation and the patient is agreeable to proceed. Written consent to be obtained. Continue pantoprazole 40mg daily.   Avy Barlett S. Ulises Wolfinger, MHS, PA-C [facility]  

## 2022-05-18 NOTE — Telephone Encounter (Signed)
Tammy, can we find out what type of medication coverage patient has? Not sure if she is [place] assistance? Need to find out options for constipation management.

## 2022-05-21 ENCOUNTER — Encounter: Payer: Self-pay | Admitting: Internal Medicine

## 2022-05-21 ENCOUNTER — Ambulatory Visit: Payer: Self-pay | Attending: Internal Medicine | Admitting: Internal Medicine

## 2022-05-21 ENCOUNTER — Other Ambulatory Visit: Payer: Self-pay

## 2022-05-21 VITALS — BP 128/78 | HR 70 | Ht 61.0 in | Wt 153.4 lb

## 2022-05-21 DIAGNOSIS — J4530 Mild persistent asthma, uncomplicated: Secondary | ICD-10-CM

## 2022-05-21 DIAGNOSIS — Z23 Encounter for immunization: Secondary | ICD-10-CM

## 2022-05-21 DIAGNOSIS — I10 Essential (primary) hypertension: Secondary | ICD-10-CM

## 2022-05-21 DIAGNOSIS — R399 Unspecified symptoms and signs involving the genitourinary system: Secondary | ICD-10-CM

## 2022-05-21 DIAGNOSIS — E039 Hypothyroidism, unspecified: Secondary | ICD-10-CM

## 2022-05-21 DIAGNOSIS — B351 Tinea unguium: Secondary | ICD-10-CM

## 2022-05-21 DIAGNOSIS — R748 Abnormal levels of other serum enzymes: Secondary | ICD-10-CM

## 2022-05-21 DIAGNOSIS — R300 Dysuria: Secondary | ICD-10-CM

## 2022-05-21 LAB — POCT URINALYSIS DIP (CLINITEK)
Bilirubin, UA: NEGATIVE
Blood, UA: NEGATIVE
Glucose, UA: NEGATIVE mg/dL
Ketones, POC UA: NEGATIVE mg/dL
Leukocytes, UA: NEGATIVE
Nitrite, UA: NEGATIVE
POC PROTEIN,UA: NEGATIVE
Spec Grav, UA: 1.01 (ref 1.010–1.025)
Urobilinogen, UA: 0.2 E.U./dL
pH, UA: 7 (ref 5.0–8.0)

## 2022-05-21 MED ORDER — ZOSTER VAC RECOMB ADJUVANTED 50 MCG/0.5ML IM SUSR: 0.5000 mL | Freq: Once | INTRAMUSCULAR | 0 refills | Status: AC

## 2022-05-21 MED ORDER — SULFAMETHOXAZOLE-TRIMETHOPRIM 800-160 MG PO TABS
1.0000 | ORAL_TABLET | Freq: Two times a day (BID) | ORAL | 0 refills | Status: DC
  Filled 2022-05-21: qty 6, 3d supply, fill #0

## 2022-05-21 NOTE — Progress Notes (Signed)
Patient ID: Alexandra Brewer, female    DOB: [birth year]  MRN: [id number]  CC: Urinary Tract Infection   Subjective: Alexandra Brewer is a 67 y.o. female who presents for chronic ds management Her concerns today include:  HTN, thyroid ds, HL, GERD, Frozen shoulder RT, asthma mild persistent and food allergy (followed by allergy/asthma ctr)   Thinks she has UTI Reports urgency and discomfort with urination.  No blood in urine.  Taking Azo OTC without improvement  HTN:  taking Norvasc and took already for the a.m. Checking BP daily.  Gives range 120-130/70-90 No CP/LE edema Compliant with taking levothyroxine for her thyroid.  Last TSH level done in October was normal at 4.3.  Has seen GI for dysphagia, elev Alk phos Plan for EDG/c-scope next mth Has mild persistent Alk. Phos - GGT and anti mitochodrial ordered but not done  Asthma:  saw her Allergist since last visit.  Flonase changed to Zyxal and inh changed to Arnuity.  Doing well on her medications.  Concern about nail discoloration RT 5th finger.  Think she has fungus of the nail.  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  HM:  due for shingles vaccine.  Did not get shot last time due to lack insurance  Patient Active Problem List   Diagnosis Date Noted   Elevated LFTs 05/18/2022   RUQ pain 05/18/2022   Abdominal pain, epigastric 05/16/2021   Esophageal dysphagia 05/16/2021   Calculus of gallbladder without cholecystitis without obstruction 01/27/2021   Fatty liver 01/27/2021   Class 1 obesity due to excess calories with serious comorbidity and body mass index (BMI) of 31.0 to 31.9 in adult 01/27/2021   Long term (current) use of non-steroidal anti-inflammatories (nsaid) 01/12/2021   Migraine without aura and without status migrainosus, not intractable 05/31/2020   Abdominal pain 03/10/2018   Sciatica of right side 06/11/2017   RUQ abdominal pain 01/23/2017   GERD (gastroesophageal reflux disease)  11/26/2016   Right Achilles tendinitis 08/15/2016   Hypertension 07/05/2016   Adhesive capsulitis of right shoulder 02/21/2016   Heel pain, bilateral 01/10/2016   Varicose veins of left lower extremity with pain 01/10/2016   History of colonic polyps    Diverticulosis of colon without hemorrhage    Hypothyroidism 08/10/2015   Chronic constipation 08/05/2015   Hyperlipidemia 08/05/2015     Current Outpatient Medications on File Prior to Visit  Medication Sig Dispense Refill   albuterol (VENTOLIN HFA) 108 (90 Base) MCG/ACT inhaler Inhale 2 puffs into the lungs every 6 (six) hours as needed for wheezing or shortness of breath. 6.7 g 1   amLODipine (NORVASC) 5 MG tablet Take 1 tablet (5 mg total) by mouth daily. 90 tablet 2   cetirizine (ZYRTEC) 10 MG chewable tablet Chew 1 tablet (10 mg total) by mouth daily. 30 tablet 5   EPINEPHrine 0.3 mg/0.3 mL IJ SOAJ injection Inject 0.3 mg into the muscle as needed for anaphylaxis. 2 each 1   Fluticasone Furoate (ARNUITY ELLIPTA) 100 MCG/ACT AEPB Inhale 1 puff into the lungs daily. 30 each 5   ipratropium (ATROVENT) 0.06 % nasal spray Place 2 sprays into both nostrils in the morning and at bedtime. 15 mL 5   levothyroxine (SYNTHROID) 112 MCG tablet TAKE 1 TABLET (112 MCG TOTAL) BY MOUTH DAILY. 90 tablet 2   meloxicam (MOBIC) 15 MG tablet Take 1 tablet (15 mg total) by mouth daily. 30 tablet 2   montelukast (SINGULAIR) 10 MG tablet Take  1 tablet (10 mg total) by mouth at bedtime. 30 tablet 5   pantoprazole (PROTONIX) 40 MG tablet Take 1 tablet (40 mg total) by mouth daily. 30 tablet 3   Wheat Dextrin (BENEFIBER) POWD Take by mouth daily. Two tablespoons     Zoster Vaccine Adjuvanted [facility]) injection Inject 0.5 mLs into the muscle once for 1 dose. 0.5 mL 0   No current facility-administered medications on file prior to visit.    Allergies  Allergen Reactions   Fish Allergy     Throat swell up   Naproxen Rash   Shellfish Allergy     Throat  swell up   Penicillins Hives    Social History   Socioeconomic History   Marital status: Married    Spouse name: Not on file   Number of children: 1   Years of education: Not on file   Highest education level: 3rd grade  Occupational History   Not on file  Tobacco Use   Smoking status: Never   Smokeless tobacco: Never  Vaping Use   Vaping Use: Never used  Substance and Sexual Activity   Alcohol use: No    Alcohol/week: 0.0 standard drinks of alcohol   Drug use: No   Sexual activity: Yes    Birth control/protection: Post-menopausal  Other Topics Concern   Not on file  Social History Narrative   Not on file   Social Determinants of Health   Financial Resource Strain: Not on file  Food Insecurity: No Food Insecurity (02/08/2022)   Hunger Vital Sign    Worried About Running Out of Food in the Last Year: Never true    Ran Out of Food in the Last Year: Never true  Transportation Needs: Unmet Transportation Needs (02/08/2022)   PRAPARE - Hydrologist (Medical): Yes    Lack of Transportation (Non-Medical): Yes  Physical Activity: Not on file  Stress: Not on file  Social Connections: Not on file  Intimate Partner Violence: Not on file    Family History  Problem Relation Age of Onset   Allergic rhinitis Mother    Diabetes Father    Hypertension Father    Asthma Father    Stroke Maternal Grandmother    Stroke Maternal Grandfather    Colon cancer Neg Hx    Angioedema Neg Hx    Atopy Neg Hx    Immunodeficiency Neg Hx    Urticaria Neg Hx    Breast cancer Neg Hx     Past Surgical History:  Procedure Laterality Date   BREAST BIOPSY Left 2018   FIBROCYSTIC CHANGES WITH FEATURES OF RUPTURE   CATARACT EXTRACTION     CHOLECYSTECTOMY N/A 05/26/2021   Procedure: LAPAROSCOPIC CHOLECYSTECTOMY;  Surgeon: Aviva Signs, MD;  Location: [facility];  Service: General;  Laterality: N/A;   COLONOSCOPY N/A 10/10/2015   one 80m polyp removed (benign colon  mucosa), diverticulosis, internal hemorrhoids, next colonoscopy 09/2025. Procedure: COLONOSCOPY;  Surgeon: RDaneil Dolin MD;  Location: [facility];  Service: Endoscopy;  Laterality: N/A;  245 - Interpreter scheduled, do NOT move   GALLBLADDER SURGERY  05/26/2021    ROS: Review of Systems Negative except as stated above  PHYSICAL EXAM: BP 128/78   Pulse 70   Ht 5' 1"$  (1.549 m)   Wt 153 lb 6.4 oz (69.6 kg)   SpO2 100%   BMI 28.98 kg/m   Physical Exam  General appearance - alert, well appearing, and in no distress Mental status -  normal mood, behavior, speech, dress, motor activity, and thought processes Neck - supple, no significant adenopathy Chest - clear to auscultation, no wheezes, rales or rhonchi, symmetric air entry Heart - normal rate, regular rhythm, normal S1, S2, no murmurs, rubs, clicks or gallops Extremities - peripheral pulses normal, no pedal edema, no clubbing or cyanosis Skin -right fifth finger: The nailbed is chipped and discolored. Results for orders placed or performed in visit on 05/21/22  POCT URINALYSIS DIP (CLINITEK)  Result Value Ref Range   Color, UA yellow yellow   Clarity, UA clear clear   Glucose, UA negative negative mg/dL   Bilirubin, UA negative negative   Ketones, POC UA negative negative mg/dL   Spec Grav, UA 1.010 1.010 - 1.025   Blood, UA negative negative   pH, UA 7.0 5.0 - 8.0   POC PROTEIN,UA negative negative, trace   Urobilinogen, UA 0.2 0.2 or 1.0 E.U./dL   Nitrite, UA Negative Negative   Leukocytes, UA Negative Negative       Latest Ref Rng & Units 06/05/2021   11:21 AM 05/23/2021    1:21 PM 01/12/2021    9:33 AM  CMP  Glucose 70 - 99 mg/dL 91  80  89   BUN 8 - 27 mg/dL 9  15  9   $ Creatinine 0.57 - 1.00 mg/dL 0.83  0.52  0.65   Sodium 134 - 144 mmol/L 141  141  141   Potassium 3.5 - 5.2 mmol/L 4.6  4.1  4.9   Chloride 96 - 106 mmol/L 101  105  103   CO2 20 - 29 mmol/L 25  28    Calcium 8.7 - 10.3 mg/dL 9.8  9.4  9.7    Total Protein 6.0 - 8.5 g/dL 8.3  7.9  7.5   Total Bilirubin 0.0 - 1.2 mg/dL 0.3  0.4  0.3   Alkaline Phos 44 - 121 IU/L 137  105  129   AST 0 - 40 IU/L 19  21  17   $ ALT 0 - 32 IU/L 18  20     Lipid Panel     Component Value Date/Time   CHOL 203 (H) 06/06/2020 1342   TRIG 356 (H) 06/06/2020 1342   HDL 46 06/06/2020 1342   CHOLHDL 4.4 06/06/2020 1342   CHOLHDL 3.8 07/14/2015 0908   VLDL 18 07/14/2015 0908   LDLCALC 97 06/06/2020 1342    CBC    Component Value Date/Time   WBC 7.9 06/05/2021 1121   WBC 7.7 05/23/2021 1321   RBC 4.88 06/05/2021 1121   RBC 4.75 05/23/2021 1321   HGB 13.8 06/05/2021 1121   HCT 42.3 06/05/2021 1121   PLT 414 (H) 05/23/2021 1321   PLT 388 01/12/2021 0933   MCV 87 06/05/2021 1121   MCH 28.3 06/05/2021 1121   MCH 29.1 05/23/2021 1321   MCHC 32.6 06/05/2021 1121   MCHC 32.7 05/23/2021 1321   RDW 13.1 06/05/2021 1121   LYMPHSABS 1.8 06/05/2021 1121   MONOABS 0.8 05/23/2021 1321   EOSABS 0.1 06/05/2021 1121   BASOSABS 0.0 06/05/2021 1121    ASSESSMENT AND PLAN: 1. Essential hypertension At goal.  Continue Norvasc 5 mg daily. - CBC - Comprehensive metabolic panel - Lipid panel  2. Acquired hypothyroidism Controlled.  Continue levothyroxine 112 mcg daily.  3. Stable asthma, mild persistent Stable on Arnuity.  4. Onychomycosis Discussed management with Lamisil p.o.  Patient is afraid of possible side effects and decided that she does not  want to try the medication.  5. Elevated alkaline phosphatase level - Mitochondrial Antibodies - Gamma GT  6. Need for shingles vaccine Prescription given for the Shingrix vaccine for her to take to our pharmacy downstairs today to see if she can get it through the patient assistance program. - Zoster Vaccine Adjuvanted [facility]) injection; Inject 0.5 mLs into the muscle once for 1 dose.  Dispense: 0.5 mL; Refill: 0  7. Dysuria UA today looks fine but giving her symptoms we will treat with 3 days  of Bactrim - sulfamethoxazole-trimethoprim (BACTRIM DS) 800-160 MG tablet; Take 1 tablet by mouth 2 (two) times daily.  Dispense: 6 tablet; Refill: 0  8. UTI symptoms See #7 above. - POCT URINALYSIS DIP (CLINITEK)    [employer] Language interpreter used during this encounter. #Miguel [id number]  Patient was given the opportunity to ask questions.  Patient verbalized understanding of the plan and was able to repeat key elements of the plan.   This documentation was completed using Radio producer.  Any transcriptional errors are unintentional.  Orders Placed This Encounter  Procedures   CBC   Comprehensive metabolic panel   Lipid panel   Mitochondrial Antibodies   Gamma GT   POCT URINALYSIS DIP (CLINITEK)     Requested Prescriptions   Signed Prescriptions Disp Refills   sulfamethoxazole-trimethoprim (BACTRIM DS) 800-160 MG tablet 6 tablet 0    Sig: Take 1 tablet by mouth 2 (two) times daily.   Zoster Vaccine Adjuvanted [facility]) injection 0.5 mL 0    Sig: Inject 0.5 mLs into the muscle once for 1 dose.    Return in about 6 months (around 11/19/2022).  Karle Plumber, MD, FACP

## 2022-05-21 NOTE — Telephone Encounter (Signed)
Pt pays out of pocket for her medications. Pt has the [place] community care orange card that has some prescription benefits but not sure to what extent.

## 2022-05-22 ENCOUNTER — Other Ambulatory Visit: Payer: Self-pay

## 2022-05-22 LAB — COMPREHENSIVE METABOLIC PANEL
ALT: 17 IU/L (ref 0–32)
AST: 17 IU/L (ref 0–40)
Albumin/Globulin Ratio: 1.5 (ref 1.2–2.2)
Albumin: 4.7 g/dL (ref 3.9–4.9)
Alkaline Phosphatase: 121 IU/L (ref 44–121)
BUN/Creatinine Ratio: 15 (ref 12–28)
BUN: 10 mg/dL (ref 8–27)
Bilirubin Total: 0.3 mg/dL (ref 0.0–1.2)
CO2: 25 mmol/L (ref 20–29)
Calcium: 10 mg/dL (ref 8.7–10.3)
Chloride: 100 mmol/L (ref 96–106)
Creatinine, Ser: 0.68 mg/dL (ref 0.57–1.00)
Globulin, Total: 3.2 g/dL (ref 1.5–4.5)
Glucose: 81 mg/dL (ref 70–99)
Potassium: 4.7 mmol/L (ref 3.5–5.2)
Sodium: 139 mmol/L (ref 134–144)
Total Protein: 7.9 g/dL (ref 6.0–8.5)
eGFR: 97 mL/min/{1.73_m2} (ref >59)

## 2022-05-22 LAB — MITOCHONDRIAL ANTIBODIES: Mitochondrial Ab: <20 Units (ref 0.0–20.0)

## 2022-05-22 LAB — LIPID PANEL
Chol/HDL Ratio: 4.2 ratio (ref 0.0–4.4)
Cholesterol, Total: 223 mg/dL — ABNORMAL HIGH (ref 100–199)
HDL: 53 mg/dL (ref >39)
LDL Chol Calc (NIH): 144 mg/dL — ABNORMAL HIGH (ref 0–99)
Triglycerides: 143 mg/dL (ref 0–149)
VLDL Cholesterol Cal: 26 mg/dL (ref 5–40)

## 2022-05-22 LAB — CBC
Hematocrit: 41.1 % (ref 34.0–46.6)
Hemoglobin: 14 g/dL (ref 11.1–15.9)
MCH: 29.4 pg (ref 26.6–33.0)
MCHC: 34.1 g/dL (ref 31.5–35.7)
MCV: 86 fL (ref 79–97)
Platelets: 400 10*3/uL (ref 150–450)
RBC: 4.76 x10E6/uL (ref 3.77–5.28)
RDW: 13 % (ref 11.7–15.4)
WBC: 7.5 10*3/uL (ref 3.4–10.8)

## 2022-05-22 LAB — GAMMA GT: GGT: 20 IU/L (ref 0–60)

## 2022-05-23 ENCOUNTER — Other Ambulatory Visit: Payer: Self-pay

## 2022-05-23 LAB — CBC WITH DIFFERENTIAL/PLATELET
Basophils Absolute: 0.1 10*3/uL (ref 0.0–0.2)
Basos: 1 %
EOS (ABSOLUTE): 0.1 10*3/uL (ref 0.0–0.4)
Eos: 2 %
Hematocrit: 42.8 % (ref 34.0–46.6)
Hemoglobin: 14 g/dL (ref 11.1–15.9)
Immature Grans (Abs): 0 10*3/uL (ref 0.0–0.1)
Immature Granulocytes: 0 %
Lymphocytes Absolute: 2.4 10*3/uL (ref 0.7–3.1)
Lymphs: 33 %
MCH: 28.6 pg (ref 26.6–33.0)
MCHC: 32.7 g/dL (ref 31.5–35.7)
MCV: 88 fL (ref 79–97)
Monocytes Absolute: 0.6 10*3/uL (ref 0.1–0.9)
Monocytes: 9 %
Neutrophils Absolute: 4 10*3/uL (ref 1.4–7.0)
Neutrophils: 55 %
Platelets: 392 10*3/uL (ref 150–450)
RBC: 4.89 x10E6/uL (ref 3.77–5.28)
RDW: 13.2 % (ref 11.7–15.4)
WBC: 7.2 10*3/uL (ref 3.4–10.8)

## 2022-05-23 LAB — COMPREHENSIVE METABOLIC PANEL
ALT: 17 IU/L (ref 0–32)
AST: 20 IU/L (ref 0–40)
Albumin/Globulin Ratio: 1.6 (ref 1.2–2.2)
Albumin: 4.6 g/dL (ref 3.9–4.9)
Alkaline Phosphatase: 124 IU/L — ABNORMAL HIGH (ref 44–121)
BUN/Creatinine Ratio: 16 (ref 12–28)
BUN: 11 mg/dL (ref 8–27)
Bilirubin Total: 0.3 mg/dL (ref 0.0–1.2)
CO2: 25 mmol/L (ref 20–29)
Calcium: 10.1 mg/dL (ref 8.7–10.3)
Chloride: 102 mmol/L (ref 96–106)
Creatinine, Ser: 0.69 mg/dL (ref 0.57–1.00)
Globulin, Total: 2.9 g/dL (ref 1.5–4.5)
Glucose: 83 mg/dL (ref 70–99)
Potassium: 4.5 mmol/L (ref 3.5–5.2)
Sodium: 141 mmol/L (ref 134–144)
Total Protein: 7.5 g/dL (ref 6.0–8.5)
eGFR: 96 mL/min/{1.73_m2} (ref >59)

## 2022-05-23 LAB — MITOCHONDRIAL ANTIBODIES: Mitochondrial Ab: <20 Units (ref 0.0–20.0)

## 2022-05-23 LAB — GAMMA GT: GGT: 19 IU/L (ref 0–60)

## 2022-05-23 NOTE — Telephone Encounter (Signed)
OK.   She needs treatment for her constipation. Option include her taking Miralax one capful twice daily until soft bowel movement at least every other day OR try to get patient assistance for Linzess. Please let pt know.

## 2022-05-24 NOTE — Telephone Encounter (Signed)
Pt was made aware and verbalized understanding using interpreter service.

## 2022-05-29 ENCOUNTER — Other Ambulatory Visit: Payer: Self-pay

## 2022-06-04 ENCOUNTER — Other Ambulatory Visit: Payer: Self-pay

## 2022-06-11 ENCOUNTER — Ambulatory Visit (HOSPITAL_COMMUNITY)
Admission: RE | Admit: 2022-06-11 | Discharge: 2022-06-11 | Disposition: A | Discharge status: Home / Self Care | Payer: Self-pay | Attending: Internal Medicine | Admitting: Internal Medicine

## 2022-06-11 ENCOUNTER — Ambulatory Visit (HOSPITAL_BASED_OUTPATIENT_CLINIC_OR_DEPARTMENT_OTHER): Payer: Self-pay | Admitting: Anesthesiology

## 2022-06-11 ENCOUNTER — Other Ambulatory Visit: Payer: Self-pay

## 2022-06-11 ENCOUNTER — Encounter (HOSPITAL_COMMUNITY): Payer: Self-pay | Admitting: Internal Medicine

## 2022-06-11 ENCOUNTER — Encounter (HOSPITAL_COMMUNITY)
Admission: RE | Disposition: A | Discharge status: Home / Self Care | Payer: Self-pay | Source: Home / Self Care | Attending: Internal Medicine

## 2022-06-11 ENCOUNTER — Ambulatory Visit (HOSPITAL_COMMUNITY): Payer: Self-pay | Admitting: Anesthesiology

## 2022-06-11 DIAGNOSIS — G709 Myoneural disorder, unspecified: Secondary | ICD-10-CM | POA: Insufficient documentation

## 2022-06-11 DIAGNOSIS — E039 Hypothyroidism, unspecified: Secondary | ICD-10-CM

## 2022-06-11 DIAGNOSIS — K2100 Gastro-esophageal reflux disease with esophagitis, without bleeding: Secondary | ICD-10-CM

## 2022-06-11 DIAGNOSIS — K229 Disease of esophagus, unspecified: Secondary | ICD-10-CM | POA: Insufficient documentation

## 2022-06-11 DIAGNOSIS — K219 Gastro-esophageal reflux disease without esophagitis: Secondary | ICD-10-CM | POA: Insufficient documentation

## 2022-06-11 DIAGNOSIS — Z79899 Other long term (current) drug therapy: Secondary | ICD-10-CM | POA: Insufficient documentation

## 2022-06-11 DIAGNOSIS — E785 Hyperlipidemia, unspecified: Secondary | ICD-10-CM | POA: Insufficient documentation

## 2022-06-11 DIAGNOSIS — R1310 Dysphagia, unspecified: Secondary | ICD-10-CM | POA: Insufficient documentation

## 2022-06-11 DIAGNOSIS — J45909 Unspecified asthma, uncomplicated: Secondary | ICD-10-CM

## 2022-06-11 DIAGNOSIS — I10 Essential (primary) hypertension: Secondary | ICD-10-CM

## 2022-06-11 DIAGNOSIS — Z79890 Hormone replacement therapy (postmenopausal): Secondary | ICD-10-CM | POA: Insufficient documentation

## 2022-06-11 HISTORY — PX: BIOPSY: SHX5522

## 2022-06-11 HISTORY — PX: ESOPHAGOGASTRODUODENOSCOPY (EGD) WITH PROPOFOL: SHX5813

## 2022-06-11 HISTORY — PX: MALONEY DILATION: SHX5535

## 2022-06-11 SURGERY — ESOPHAGOGASTRODUODENOSCOPY (EGD) WITH PROPOFOL
Anesthesia: General

## 2022-06-11 MED ORDER — LIDOCAINE HCL (PF) 2 % IJ SOLN
INTRAMUSCULAR | Status: AC
  Filled 2022-06-11: qty 5

## 2022-06-11 MED ORDER — PROPOFOL 500 MG/50ML IV EMUL
INTRAVENOUS | Status: DC | PRN
  Administered 2022-06-11: 180 ug/kg/min via INTRAVENOUS

## 2022-06-11 MED ORDER — LACTATED RINGERS IV SOLN: INTRAVENOUS | Status: DC

## 2022-06-11 MED ORDER — LIDOCAINE HCL (PF) 2 % IJ SOLN
INTRAMUSCULAR | Status: DC | PRN
  Administered 2022-06-11: 50 mg via INTRADERMAL

## 2022-06-11 MED ORDER — PROPOFOL 10 MG/ML IV BOLUS
INTRAVENOUS | Status: DC | PRN
  Administered 2022-06-11: 80 mg via INTRAVENOUS

## 2022-06-11 MED ORDER — PROPOFOL 500 MG/50ML IV EMUL
INTRAVENOUS | Status: AC
  Filled 2022-06-11: qty 50

## 2022-06-11 NOTE — Interval H&P Note (Signed)
History and Physical Interval Note:  06/11/2022 8:04 AM  Alexandra Brewer  has presented today for surgery, with the diagnosis of dysphagia,GERD,RUQ pain.  The various methods of treatment have been discussed with the patient and family. After consideration of risks, benefits and other options for treatment, the patient has consented to  Procedure(s) with comments: ESOPHAGOGASTRODUODENOSCOPY (EGD) WITH PROPOFOL (N/A) - 8:15 am MALONEY DILATION (N/A) as a surgical intervention.  The patient's history has been reviewed, patient examined, no change in status, stable for surgery.  I have reviewed the patient's chart and labs.  Questions were answered to the patient's satisfaction.     Alexandra Brewer   No change.  Discussed EGD with esophageal dilation as feasible/appropriate per plan .The risks, benefits, limitations, alternatives and imponderables have been reviewed with the patient. Potential for esophageal dilation, biopsy, etc. have also been reviewed.  Questions have been answered. All parties agreeable. questions answered.  All via interpreter.  Patient is agreeable.

## 2022-06-11 NOTE — Discharge Instructions (Addendum)
EGD Discharge instructions Please read the instructions outlined below and refer to this sheet in the next few weeks. These discharge instructions provide you with general information on caring for yourself after you leave the hospital. Your doctor may also give you specific instructions. While your treatment has been planned according to the most current medical practices available, unavoidable complications occasionally occur. If you have any problems or questions after discharge, please call your doctor. ACTIVITY You may resume your regular activity but move at a slower pace for the next 24 hours.  Take frequent rest periods for the next 24 hours.  Walking will help expel (get rid of) the air and reduce the bloated feeling in your abdomen.  No driving for 24 hours (because of the anesthesia (medicine) used during the test).  You may shower.  Do not sign any important legal documents or operate any machinery for 24 hours (because of the anesthesia used during the test).  NUTRITION Drink plenty of fluids.  You may resume your normal diet.  Begin with a light meal and progress to your normal diet.  Avoid alcoholic beverages for 24 hours or as instructed by your caregiver.  MEDICATIONS You may resume your normal medications unless your caregiver tells you otherwise.  WHAT YOU CAN EXPECT TODAY You may experience abdominal discomfort such as a feeling of fullness or "gas" pains.  FOLLOW-UP Your doctor will discuss the results of your test with you.  SEEK IMMEDIATE MEDICAL ATTENTION IF ANY OF THE FOLLOWING OCCUR: Excessive nausea (feeling sick to your stomach) and/or vomiting.  Severe abdominal pain and distention (swelling).  Trouble swallowing.  Temperature over 101 F (37.8 C).  Rectal bleeding or vomiting of blood.     Your upper GI tract appeared normal of the your esophagus looked a little narrowed.  Your esophagus was dilated.  I also took biopsies.  Advance diet as tolerated  today  Continue Protonix 40 mg daily 30 minutes before breakfast  Further recommendations to follow pending review of pathology report  Office visit with Neil Crouch in 3 months--OFFICE TO [place]

## 2022-06-11 NOTE — Transfer of Care (Signed)
Immediate Anesthesia Transfer of Care Note  Patient: Alexandra Brewer  Procedure(s) Performed: ESOPHAGOGASTRODUODENOSCOPY (EGD) WITH PROPOFOL [place]  Patient Location: PACU  Anesthesia Type:General  Level of Consciousness: awake, alert , and oriented  Airway & Oxygen Therapy: Patient Spontanous Breathing  Post-op Assessment: Report given to RN, Post -op Vital signs reviewed and stable, Patient moving all extremities X 4, and Patient able to stick tongue midline  Post vital signs: Reviewed  Last Vitals:  Vitals Value Taken Time  BP 93/47   Temp 96.7   Pulse 85   Resp 15   SpO2 96     Last Pain:  Vitals:   06/11/22 0813  TempSrc:   PainSc: 0-No pain      Patients Stated Pain Goal: 8 ([id number])  Complications: No notable events documented.

## 2022-06-11 NOTE — Anesthesia Procedure Notes (Signed)
Procedure Name: General with mask airway Date/Time: 06/11/2022 8:15 AM  Performed by: Maude Leriche, CRNAPre-anesthesia Checklist: Patient identified, Emergency Drugs available, Suction available, Patient being monitored and Timeout performed Patient Re-evaluated:Patient Re-evaluated prior to induction Oxygen Delivery Method: Nasal cannula Placement Confirmation: positive ETCO2 Dental Injury: Teeth and Oropharynx as per pre-operative assessment

## 2022-06-11 NOTE — Op Note (Signed)
[facility] Patient Name: Alexandra Brewer Procedure Date: 06/11/2022 7:54 AM MRN: [id number] Date of Birth: [birth year] Attending MD: Alexandra Brewer , MD, [id number] CSN: [id number] Age: 67 Admit Type: Outpatient Procedure:                Upper GI endoscopy Indications:              Dysphagia Providers:                Alexandra Richards, MD, Lambert Mody,                            Kristine L. Risa Grill, Technician Referring MD:              Medicines:                Propofol per Anesthesia Complications:            No immediate complications. Estimated Blood Loss:     Estimated blood loss was minimal. Procedure:                Pre-Anesthesia Assessment:                           - Prior to the procedure, a History and Physical                            was performed, and patient medications and                            allergies were reviewed. The patient's tolerance of                            previous anesthesia was also reviewed. The risks                            and benefits of the procedure and the sedation                            options and risks were discussed with the patient.                            All questions were answered, and informed consent                            was obtained. Prior Anticoagulants: The patient has                            taken no anticoagulant or antiplatelet agents. ASA                            Grade Assessment: II - A patient with mild systemic                            disease. After reviewing the risks and benefits,  the patient was deemed in satisfactory condition to                            undergo the procedure.                           After obtaining informed consent, the endoscope was                            passed under direct vision. Throughout the                            procedure, the patient's blood pressure, pulse, and                            oxygen  saturations were monitored continuously. The                            [id number]-H190 [id number]) scope was introduced through the                            mouth, and advanced to the second part of duodenum.                            The upper GI endoscopy was accomplished without                            difficulty. The patient tolerated the procedure                            well. Scope In: 8:17:51 AM Scope Out: 8:26:25 AM Total Procedure Duration: 0 hours 8 minutes 34 seconds  Findings:      Subjectively, tubular esophagus appeared slightly narrowed and "ringed"       in appearance. Adult gastric easily traversed the esophagus, however.       Mucosa otherwise appeared normal.      The entire examined stomach was normal.      The duodenal bulb and second portion of the duodenum were normal. Scope       withdrawn, a 52 French Maloney dilator was passed to full insertion with       mild resistance. A look back revealed no apparent complications related       to this maneuver. Finally, biopsies of mid and distal esophagus taken       for histologic study. Impression:               - Subjectively, mildly narrowed tubular esophagus                            with a ringed appearance. Rule out EOE. Status post                            Alexandra Brewer dilation followed by biopsy                           Normal stomach.                           -  Normal duodenal bulb and second portion of the                            duodenum.                           - Moderate Sedation:      Moderate (conscious) sedation was personally administered by an       anesthesia professional. The following parameters were monitored: oxygen       saturation, heart rate, blood pressure, respiratory rate, EKG, adequacy       of pulmonary ventilation, and response to care. Recommendation:           - Patient has a contact number available for                            emergencies. The signs and symptoms of potential                             delayed complications were discussed with the                            patient. Return to normal activities tomorrow.                            Written discharge instructions were provided to the                            patient.                           - Advance diet as tolerated. Continue Protonix 40                            mg daily. Follow-up on pathology. Office visit with                            [place] in 3 months Procedure Code(s):        --- Professional ---                           [phone], Esophagogastroduodenoscopy, flexible,                            transoral; diagnostic, including collection of                            specimen(s) by brushing or washing, when performed                            (separate procedure) Diagnosis Code(s):        --- Professional ---                           R13.10, Dysphagia, unspecified CPT copyright 2022 American Medical Association. All rights reserved. The codes documented in this report are preliminary and upon coder review  may  be revised to meet current compliance requirements. Alexandra Brewer. Alexandra Bonzo, MD Alexandra Richards, MD 06/11/2022 8:34:07 AM This report has been signed electronically. Number of Addenda: 0

## 2022-06-11 NOTE — Anesthesia Postprocedure Evaluation (Signed)
Anesthesia Post Note  Patient: Alexandra Brewer  Procedure(s) Performed: ESOPHAGOGASTRODUODENOSCOPY (EGD) WITH PROPOFOL [place]  Patient location during evaluation: Phase II Anesthesia Type: General Level of consciousness: awake Pain management: pain level controlled Vital Signs Assessment: post-procedure vital signs reviewed and stable Respiratory status: spontaneous breathing and respiratory function stable Cardiovascular status: blood pressure returned to baseline and stable Postop Assessment: no headache and no apparent nausea or vomiting Anesthetic complications: no Comments: Late entry   No notable events documented.   Last Vitals:  Vitals:   06/11/22 0833 06/11/22 0835  BP: (!) 90/50 (!) 97/50  Pulse:    Resp: 18   Temp:    SpO2: 96%     Last Pain:  Vitals:   06/11/22 0835  TempSrc:   PainSc: 0-No pain                 Louann Sjogren

## 2022-06-11 NOTE — Anesthesia Preprocedure Evaluation (Signed)
Anesthesia Evaluation  Patient identified by MRN, date of birth, ID band Patient awake    Reviewed: Allergy & Precautions, H&P , NPO status , Patient's Chart, lab work & pertinent test results, reviewed documented beta blocker date and time   Airway Mallampati: II  TM Distance: >3 FB Neck ROM: full    Dental no notable dental hx.    Pulmonary neg pulmonary ROS, asthma    Pulmonary exam normal breath sounds clear to auscultation       Cardiovascular Exercise Tolerance: Good hypertension, negative cardio ROS  Rhythm:regular Rate:Normal     Neuro/Psych  Headaches  Neuromuscular disease negative neurological ROS  negative psych ROS   GI/Hepatic negative GI ROS, Neg liver ROS,GERD  ,,  Endo/Other  negative endocrine ROSHypothyroidism    Renal/GU negative Renal ROS  negative genitourinary   Musculoskeletal   Abdominal   Peds  Hematology negative hematology ROS (+)   Anesthesia Other Findings   Reproductive/Obstetrics negative OB ROS                             Anesthesia Physical Anesthesia Plan  ASA: 2  Anesthesia Plan: General   Post-op Pain Management:    Induction:   PONV Risk Score and Plan: Propofol infusion  Airway Management Planned:   Additional Equipment:   Intra-op Plan:   Post-operative Plan:   Informed Consent: I have reviewed the patients History and Physical, chart, labs and discussed the procedure including the risks, benefits and alternatives for the proposed anesthesia with the patient or authorized representative who has indicated his/her understanding and acceptance.     Dental Advisory Given  Plan Discussed with: CRNA  Anesthesia Plan Comments:        Anesthesia Quick Evaluation

## 2022-06-11 NOTE — Progress Notes (Signed)
Post-op and discharge instructions reviewed w/ pt and spouse via interpreter, Beverlee Nims [id number].  Both verbalized understanding.  Instructions also provided in spanish.

## 2022-06-13 LAB — SURGICAL PATHOLOGY

## 2022-06-14 ENCOUNTER — Encounter: Payer: Self-pay | Admitting: Internal Medicine

## 2022-06-18 ENCOUNTER — Other Ambulatory Visit: Payer: Self-pay

## 2022-06-20 ENCOUNTER — Encounter (HOSPITAL_COMMUNITY): Payer: Self-pay | Admitting: Internal Medicine

## 2022-06-25 ENCOUNTER — Other Ambulatory Visit: Payer: Self-pay

## 2022-06-25 ENCOUNTER — Other Ambulatory Visit: Payer: Self-pay | Admitting: Allergy

## 2022-06-25 MED ORDER — LEVOCETIRIZINE DIHYDROCHLORIDE 5 MG PO TABS
5.0000 mg | ORAL_TABLET | Freq: Every evening | ORAL | 5 refills | Status: DC
  Filled 2022-06-25: qty 30, 30d supply, fill #0
  Filled 2022-08-06 (×2): qty 30, 30d supply, fill #1
  Filled 2022-08-23: qty 30, 30d supply, fill #2

## 2022-07-02 ENCOUNTER — Telehealth: Payer: Self-pay

## 2022-07-02 NOTE — Telephone Encounter (Signed)
Copied from [place] [id number]. Topic: General - Other >> Jul 02, 2022  9:49 AM [place] B wrote: Reason for CRM: Patient called in about needing letter stating the Dr's name she is being seen my in order to get medical assistance not for orange card , she says  a different card but couldn't say what kind of card.

## 2022-07-04 ENCOUNTER — Telehealth: Payer: Self-pay

## 2022-07-04 NOTE — Telephone Encounter (Signed)
Alexandra Brewer- I spoke to the DSS Medicaid caseworkers this afternoon and they explained that the patient wanted to apply for emergency Medicaid. They need the dates of her inpatient stays to be able to request the medical records for those days in order to submit the application for emergency Medicaid.  They said that the dates would be retroactive until 04/02/2022.  They did not want ED visit dates, only inpatient. They said that the patient could not give them any dates for admissions.   When I came back to the office and checked Epic, I saw that she was only at [facility] for 2 hours on 06/11/2022 for an EGD.   Would you be able to call the patient and explain that there is no letter needed from Dr Wynetta Emery and she did not have an inpatient stay.   I can help you with the call if you like.

## 2022-07-06 NOTE — Telephone Encounter (Signed)
Called & spoke to the patient. Verified name & DOB. Informed that the letter was needed with the dates of her inpatient stays to be able to request the medical records for those days in order to submit the application for emergency Medicaid. Explained that upon checking her chart there was an ER visit an Alexandra Brewer for 2 hours on 06/11/2022 for an EGD but no hospitalization. Advised patient to return to the DSS Medicaid caseworks office to explore different Medicaid options aside from emergency Medicaid with a family member to help better explain. Patient expressed verbal understanding of all discussed. No further questions at this time.

## 2022-07-06 NOTE — Telephone Encounter (Signed)
Called LVM to call back

## 2022-07-06 NOTE — Telephone Encounter (Signed)
Called & spoke to the patient. Verified name & DOB. Informed that the letter was needed with the dates of her inpatient stays to be able to request the medical records for those days in order to submit the application for emergency Medicaid. Explained that upon checking her chart there was an ER visit an [place] for 2 hours on 06/11/2022 for an EGD but no hospitalization. Advised patient to return to the DSS Medicaid caseworks office to explore different Medicaid options aside from emergency Medicaid with a family member to help better explain. Patient expressed verbal understanding of all discussed. No further questions at this time.  

## 2022-07-09 ENCOUNTER — Other Ambulatory Visit: Payer: Self-pay

## 2022-07-18 ENCOUNTER — Ambulatory Visit: Payer: Self-pay | Attending: Internal Medicine

## 2022-07-26 ENCOUNTER — Encounter: Payer: Self-pay | Admitting: Internal Medicine

## 2022-08-06 ENCOUNTER — Other Ambulatory Visit: Payer: Self-pay

## 2022-08-23 ENCOUNTER — Other Ambulatory Visit: Payer: Self-pay

## 2022-08-24 ENCOUNTER — Ambulatory Visit: Payer: Self-pay | Attending: Internal Medicine

## 2022-09-05 ENCOUNTER — Other Ambulatory Visit: Payer: Self-pay

## 2022-09-05 ENCOUNTER — Encounter: Payer: Self-pay | Admitting: Allergy

## 2022-09-05 ENCOUNTER — Ambulatory Visit (INDEPENDENT_AMBULATORY_CARE_PROVIDER_SITE_OTHER): Payer: No Typology Code available for payment source | Admitting: Allergy

## 2022-09-05 VITALS — BP 112/70 | HR 79 | Temp 98.7°F | Resp 18

## 2022-09-05 DIAGNOSIS — L299 Pruritus, unspecified: Secondary | ICD-10-CM

## 2022-09-05 DIAGNOSIS — J3089 Other allergic rhinitis: Secondary | ICD-10-CM

## 2022-09-05 DIAGNOSIS — H1013 Acute atopic conjunctivitis, bilateral: Secondary | ICD-10-CM

## 2022-09-05 DIAGNOSIS — J4530 Mild persistent asthma, uncomplicated: Secondary | ICD-10-CM

## 2022-09-05 DIAGNOSIS — L508 Other urticaria: Secondary | ICD-10-CM

## 2022-09-05 DIAGNOSIS — T7800XD Anaphylactic reaction due to unspecified food, subsequent encounter: Secondary | ICD-10-CM

## 2022-09-05 MED ORDER — LEVOCETIRIZINE DIHYDROCHLORIDE 5 MG PO TABS
5.0000 mg | ORAL_TABLET | Freq: Two times a day (BID) | ORAL | 5 refills | Status: DC
  Filled 2022-09-05 – 2022-09-12 (×2): qty 60, 30d supply, fill #0
  Filled 2022-11-28: qty 60, 30d supply, fill #1
  Filled 2023-01-21: qty 60, 30d supply, fill #2
  Filled 2023-03-19 – 2023-03-28 (×2): qty 60, 30d supply, fill #3
  Filled 2023-04-29: qty 60, 30d supply, fill #4
  Filled 2023-06-19 (×2): qty 60, 30d supply, fill #5

## 2022-09-05 MED ORDER — IPRATROPIUM BROMIDE 0.06 % NA SOLN
2.0000 | Freq: Two times a day (BID) | NASAL | 5 refills | Status: DC | PRN
  Filled 2022-09-05: qty 15, 21d supply, fill #0

## 2022-09-05 MED ORDER — MONTELUKAST SODIUM 10 MG PO TABS
10.0000 mg | ORAL_TABLET | Freq: Every day | ORAL | 5 refills | Status: DC
  Filled 2022-09-05: qty 30, 30d supply, fill #0
  Filled 2022-11-22: qty 30, 30d supply, fill #1

## 2022-09-05 NOTE — Progress Notes (Signed)
Follow-up Note  RE: Alexandra Brewer MRN: [id number] DOB: 29-May-1955 Date of Office Visit: 09/05/2022   History of present illness: Alexandra Brewer is a 67 y.o. female presenting today for follow-up of allergic rhinitis with conjunctivitis, asthma, food allergy and urticaria. She was last seen in the office on 05/02/22 by myself.  Spanish interpreter present today for translation.   She states her allergies are not controlled at this time and she has not noted improvement in symptoms with change of medication.  I did recommend a change from zyrtec to xyzal.  She states lately she has had a lot of itching on the face, ear, neck.  Her PCP states the itching was likely allergies.  She states when the itching has been worse she has taking BID xyzal and that has really helped.  She only took the xyzal twice a day for 5 days.  She states with the rain or weather changes she does note watery eyes and nasal drainage.  The eye drop does help with use as needed.  She does find the atrovent to help with nasal drainage as well.    She states her breathing is ok and doing well. She continues to take singulair daily.  She uses albuterol as needed when she feels like she can't breathe/short of breath usually when pollen is high.  Albuterol is about 1-2 times a week on average.  She has not had any ED/UC visits or systemic steroid use since last visit.   She continues to avoid red meat, fish and shellfish.  No need for epipen use.   She denies having any hive flares.   Review of systems: Review of Systems  Constitutional: Negative.   HENT:  Positive for postnasal drip.   Eyes:  Positive for itching.  Respiratory:  Positive for shortness of breath.   Cardiovascular: Negative.   Gastrointestinal: Negative.   Musculoskeletal: Negative.   Skin:        See HPI  Allergic/Immunologic: Negative.   Neurological: Negative.      All other systems negative unless noted above in HPI  Past  medical/social/surgical/family history have been reviewed and are unchanged unless specifically indicated below.  No changes  Medication List: Current Outpatient Medications  Medication Sig Dispense Refill   albuterol (VENTOLIN HFA) 108 (90 Base) MCG/ACT inhaler Inhale 2 puffs into the lungs every 6 (six) hours as needed for wheezing or shortness of breath. 6.7 g 1   amLODipine (NORVASC) 5 MG tablet Take 1 tablet (5 mg total) by mouth daily. 90 tablet 2   dexamethasone (DECADRON) 0.1 % ophthalmic suspension Place 1 drop into both eyes daily.     EPINEPHrine 0.3 mg/0.3 mL IJ SOAJ injection Inject 0.3 mg into the muscle as needed for anaphylaxis. 2 each 1   Fluticasone Furoate (ARNUITY ELLIPTA) 100 MCG/ACT AEPB Inhale 1 puff into the lungs daily. (Patient taking differently: Inhale 1 puff into the lungs daily as needed (asthma).) 30 each 5   ipratropium (ATROVENT) 0.06 % nasal spray Place 2 sprays into both nostrils in the morning and at bedtime. 15 mL 5   levocetirizine (XYZAL) 5 MG tablet Take 1 tablet (5 mg total) by mouth every evening. 30 tablet 5   meloxicam (MOBIC) 15 MG tablet Take 1 tablet (15 mg total) by mouth daily. (Patient taking differently: Take 15 mg by mouth daily as needed for pain.) 30 tablet 2   montelukast (SINGULAIR) 10 MG tablet Take 1 tablet (10 mg total) by  mouth at bedtime. 30 tablet 5   pantoprazole (PROTONIX) 40 MG tablet Take 1 tablet (40 mg total) by mouth daily. 30 tablet 3   Wheat Dextrin (BENEFIBER) POWD Take by mouth daily. Two tablespoons     cetirizine (ZYRTEC) 10 MG chewable tablet Chew 1 tablet (10 mg total) by mouth daily. 30 tablet 5   No current facility-administered medications for this visit.     Known medication allergies: Allergies  Allergen Reactions   Fish Allergy     Throat swell up   Naproxen Rash   Shellfish Allergy     Throat swell up   Penicillins Hives     Physical examination: Blood pressure 112/70, pulse 79, temperature 98.7 F  (37.1 C), temperature source Temporal, resp. rate 18, SpO2 98 %.  General: Alert, interactive, in no acute distress. HEENT: PERRLA, TMs pearly gray, turbinates minimally edematous without discharge, post-pharynx non erythematous. Neck: Supple without lymphadenopathy. Lungs: Clear to auscultation without wheezing, rhonchi or rales. {no increased work of breathing. CV: Normal S1, S2 without murmurs. Abdomen: Nondistended, nontender. Skin: Warm and dry, without lesions or rashes. Extremities:  No clubbing, cyanosis or edema. Neuro:   Grossly intact.  Diagnositics/Labs:  Spirometry: FEV1: 1.36L 62%, FVC: 2.03L 74% predicted.  This is after a second round of attempts with more appropriate teaching for the technique.  She did much better with the second round attempts.  FEV1 however is reduced consistent with obstructive pattern.  Assessment and plan: Allergic rhinitis with conjunctivitis (tree pollen and indoor mold) Itching -use Leveocetirizine 1 tab twice a day  -Continue Singulair 10 mg at night -Continue Atrovent 2 sprays each nostril twice a day (may use up to 3 times a day if needed for nasal drainage) -Continue olopatadine 0.2% eyedrops using 1 drop each eye once a day as needed for itchy watery eyes  Mild persistent asthma -For asthma flares/upper respiratory tract infections start  Arnuity 1 puff once a day for 1-2 weeks or until symptoms return to baseline.  -Continue Singulair 10 mg as above to help prevent cough and wheeze -May use Ventolin (albuterol) 2 puffs every 4 hours as needed for cough, wheeze, tightness in chest, shortness of breath.  Also, may use albuterol 2 puffs 5 to 15 minutes prior to exercise.  Asthma control goals:  Full participation in all desired activities (may need albuterol before activity) Albuterol use two time or less a week on average (not counting use with activity) Cough interfering with sleep two time or less a month Oral steroids no more than  once a year No hospitalizations  Anaphylaxis due to food -Continue avoid  red meat, fish and shellfish. In case of an allergic reaction, give Benadryl 4 teaspoonfuls every 4 hours, and if life-threatening symptoms occur, inject with EpiPen 0.3 mg.  Urticaria (hives) -doing well.    Follow-up appointment in 4-6 months sooner if needed  I appreciate the opportunity to take part in Alexandra Brewer's care. Please do not hesitate to contact me with questions.  Sincerely,   Margo Aye, MD Allergy/Immunology [facility]

## 2022-09-05 NOTE — Patient Instructions (Addendum)
Allergic rhinitis with conjunctivitis (tree pollen and indoor mold) Itching -use Leveocetirizine 1 tab twice a day  -Continue Singulair 10 mg at night -Continue Atrovent 2 sprays each nostril twice a day (may use up to 3 times a day if needed for nasal drainage) -Continue olopatadine 0.2% eyedrops using 1 drop each eye once a day as needed for itchy watery eyes  Mild persistent asthma -For asthma flares/upper respiratory tract infections start  Arnuity 1 puff once a day for 1-2 weeks or until symptoms return to baseline.  -Continue Singulair 10 mg as above to help prevent cough and wheeze -May use Ventolin (albuterol) 2 puffs every 4 hours as needed for cough, wheeze, tightness in chest, shortness of breath.  Also, may use albuterol 2 puffs 5 to 15 minutes prior to exercise.  Asthma control goals:  Full participation in all desired activities (may need albuterol before activity) Albuterol use two time or less a week on average (not counting use with activity) Cough interfering with sleep two time or less a month Oral steroids no more than once a year No hospitalizations  Anaphylaxis due to food -Continue avoid  red meat, fish and shellfish. In case of an allergic reaction, give Benadryl 4 teaspoonfuls every 4 hours, and if life-threatening symptoms occur, inject with EpiPen 0.3 mg.  Urticaria (hives) -doing well.    Follow-up appointment in 4-6 months sooner if needed

## 2022-09-10 ENCOUNTER — Other Ambulatory Visit: Payer: Self-pay

## 2022-09-12 ENCOUNTER — Other Ambulatory Visit: Payer: Self-pay

## 2022-09-13 ENCOUNTER — Other Ambulatory Visit: Payer: Self-pay

## 2022-09-17 ENCOUNTER — Other Ambulatory Visit: Payer: Self-pay | Admitting: Internal Medicine

## 2022-09-17 ENCOUNTER — Other Ambulatory Visit: Payer: Self-pay

## 2022-09-17 DIAGNOSIS — E039 Hypothyroidism, unspecified: Secondary | ICD-10-CM

## 2022-09-17 MED ORDER — LEVOTHYROXINE SODIUM 112 MCG PO TABS
ORAL_TABLET | Freq: Every day | ORAL | 0 refills | Status: DC
  Filled 2022-09-17 (×2): qty 90, 90d supply, fill #0

## 2022-09-18 ENCOUNTER — Other Ambulatory Visit: Payer: Self-pay

## 2022-09-24 ENCOUNTER — Ambulatory Visit: Payer: Self-pay | Admitting: Physician Assistant

## 2022-09-26 ENCOUNTER — Other Ambulatory Visit: Payer: Self-pay

## 2022-10-02 ENCOUNTER — Ambulatory Visit: Payer: Self-pay | Admitting: Physician Assistant

## 2022-10-05 ENCOUNTER — Other Ambulatory Visit: Payer: Self-pay

## 2022-10-31 ENCOUNTER — Other Ambulatory Visit: Payer: Self-pay

## 2022-11-19 ENCOUNTER — Other Ambulatory Visit: Payer: Self-pay

## 2022-11-19 ENCOUNTER — Other Ambulatory Visit (HOSPITAL_COMMUNITY)
Admission: RE | Admit: 2022-11-19 | Discharge: 2022-11-19 | Disposition: A | Discharge status: Home / Self Care | Payer: Self-pay | Source: Ambulatory Visit | Attending: Internal Medicine | Admitting: Internal Medicine

## 2022-11-19 ENCOUNTER — Ambulatory Visit: Payer: Self-pay | Attending: Internal Medicine | Admitting: Internal Medicine

## 2022-11-19 ENCOUNTER — Encounter: Payer: Self-pay | Admitting: Internal Medicine

## 2022-11-19 VITALS — BP 105/67 | HR 66 | Temp 98.5°F | Ht 61.0 in | Wt 145.0 lb

## 2022-11-19 DIAGNOSIS — Z23 Encounter for immunization: Secondary | ICD-10-CM

## 2022-11-19 DIAGNOSIS — I10 Essential (primary) hypertension: Secondary | ICD-10-CM

## 2022-11-19 DIAGNOSIS — N898 Other specified noninflammatory disorders of vagina: Secondary | ICD-10-CM | POA: Insufficient documentation

## 2022-11-19 DIAGNOSIS — R634 Abnormal weight loss: Secondary | ICD-10-CM

## 2022-11-19 DIAGNOSIS — E039 Hypothyroidism, unspecified: Secondary | ICD-10-CM

## 2022-11-19 MED ORDER — LEVOTHYROXINE SODIUM 112 MCG PO TABS
ORAL_TABLET | Freq: Every day | ORAL | 0 refills | Status: DC
  Filled 2022-11-19: qty 90, fill #0

## 2022-11-19 MED ORDER — ZOSTER VAC RECOMB ADJUVANTED 50 MCG/0.5ML IM SUSR: 0.5000 mL | Freq: Once | INTRAMUSCULAR | 0 refills | Status: AC

## 2022-11-19 MED ORDER — NYSTATIN-TRIAMCINOLONE 100000-0.1 UNIT/GM-% EX OINT
TOPICAL_OINTMENT | CUTANEOUS | 0 refills | Status: DC
  Filled 2022-11-19: qty 30, 7d supply, fill #0

## 2022-11-19 NOTE — Progress Notes (Signed)
Patient ID: Alexandra Brewer, female    DOB: 02/03/1956  MRN: [id number]  CC: Hypertension (HTN f/u. /Rash on vagina, itchy X2 mo)   Subjective: Alexandra Brewer is a 67 y.o. female who presents for chronic disease management Her concerns today include:  HTN, thyroid ds, HL, GERD, Frozen shoulder RT, asthma mild persistent and food allergy (followed by allergy/asthma ctr)   [employer] Language interpreter used during this encounter. #[id number], Christian   HM: Due for Shingrix to,  influenza and COVID-19 booster  Reports itching outside the vagina x 2 mths.  Tried using ointments but not working. "Growing little hives like insect bites and they tend to be very itchy." No vaginal dischg. Sexually active with her husband only.   HTN:  taking Norvasc and took already for the a.m. Limits salt in foods No CP/LE edema  Compliant with taking levothyroxine 112 mcg for her thyroid.  Last TSH level done in October was normal at 4.3. -reports unintentional wgh loss; down 8 lbs since last visit, wanders if due to thyroid -no feeling of being hot all the time; no palpitations, N/V, abdominal pains, problems swallowing.  Some days constipation or days diarrhea (only when she has to take a laxative), no blood in stools or urine.  No fever -reports eating smaller portions. No issues with depression.    Patient Active Problem List   Diagnosis Date Noted   Elevated LFTs 05/18/2022   RUQ pain 05/18/2022   Abdominal pain, epigastric 05/16/2021   Esophageal dysphagia 05/16/2021   Calculus of gallbladder without cholecystitis without obstruction 01/27/2021   Fatty liver 01/27/2021   Class 1 obesity due to excess calories with serious comorbidity and body mass index (BMI) of 31.0 to 31.9 in adult 01/27/2021   Long term (current) use of non-steroidal anti-inflammatories (nsaid) 01/12/2021   Migraine without aura and without status migrainosus, not intractable 05/31/2020   Abdominal pain 03/10/2018    Sciatica of right side 06/11/2017   RUQ abdominal pain 01/23/2017   GERD (gastroesophageal reflux disease) 11/26/2016   Right Achilles tendinitis 08/15/2016   Hypertension 07/05/2016   Adhesive capsulitis of right shoulder 02/21/2016   Heel pain, bilateral 01/10/2016   Varicose veins of left lower extremity with pain 01/10/2016   History of colonic polyps    Diverticulosis of colon without hemorrhage    Hypothyroidism 08/10/2015   Chronic constipation 08/05/2015   Hyperlipidemia 08/05/2015     Current Outpatient Medications on File Prior to Visit  Medication Sig Dispense Refill   albuterol (VENTOLIN HFA) 108 (90 Base) MCG/ACT inhaler Inhale 2 puffs into the lungs every 6 (six) hours as needed for wheezing or shortness of breath. 6.7 g 1   amLODipine (NORVASC) 5 MG tablet Take 1 tablet (5 mg total) by mouth daily. 90 tablet 2   dexamethasone (DECADRON) 0.1 % ophthalmic suspension Place 1 drop into both eyes daily.     EPINEPHrine 0.3 mg/0.3 mL IJ SOAJ injection Inject 0.3 mg into the muscle as needed for anaphylaxis. 2 each 1   ipratropium (ATROVENT) 0.06 % nasal spray Place 2 sprays into both nostrils 2 (two) times daily as needed for rhinitis. 15 mL 5   montelukast (SINGULAIR) 10 MG tablet Take 1 tablet (10 mg total) by mouth at bedtime. 30 tablet 5   pantoprazole (PROTONIX) 40 MG tablet Take 1 tablet (40 mg total) by mouth daily. 30 tablet 3   Fluticasone Furoate (ARNUITY ELLIPTA) 100 MCG/ACT AEPB Inhale 1 puff into the lungs daily. (  Patient not taking: Reported on 11/19/2022) 30 each 5   levocetirizine (XYZAL) 5 MG tablet Take 1 tablet (5 mg total) by mouth in the morning and at bedtime. (Patient not taking: Reported on 11/19/2022) 60 tablet 5   meloxicam (MOBIC) 15 MG tablet Take 1 tablet (15 mg total) by mouth daily. (Patient not taking: Reported on 11/19/2022) 30 tablet 2   Wheat Dextrin (BENEFIBER) POWD Take by mouth daily. Two tablespoons (Patient not taking: Reported on 11/19/2022)      No current facility-administered medications on file prior to visit.    Allergies  Allergen Reactions   Fish Allergy     Throat swell up   Naproxen Rash   Shellfish Allergy     Throat swell up   Penicillins Hives    Social History   Socioeconomic History   Marital status: Married    Spouse name: Not on file   Number of children: 1   Years of education: Not on file   Highest education level: 3rd grade  Occupational History   Not on file  Tobacco Use   Smoking status: Never   Smokeless tobacco: Never  Vaping Use   Vaping status: Never Used  Substance and Sexual Activity   Alcohol use: No    Alcohol/week: 0.0 standard drinks of alcohol   Drug use: No   Sexual activity: Yes    Birth control/protection: Post-menopausal  Other Topics Concern   Not on file  Social History Narrative   Not on file   Social Determinants of Health   Financial Resource Strain: Not on file  Food Insecurity: No Food Insecurity (02/08/2022)   Hunger Vital Sign    Worried About Running Out of Food in the Last Year: Never true    Ran Out of Food in the Last Year: Never true  Transportation Needs: Unmet Transportation Needs (02/08/2022)   PRAPARE - Administrator, Civil Service (Medical): Yes    Lack of Transportation (Non-Medical): Yes  Physical Activity: Not on file  Stress: Not on file  Social Connections: Not on file  Intimate Partner Violence: Not on file    Family History  Problem Relation Age of Onset   Allergic rhinitis Mother    Diabetes Father    Hypertension Father    Asthma Father    Stroke Maternal Grandmother    Stroke Maternal Grandfather    Colon cancer Neg Hx    Angioedema Neg Hx    Atopy Neg Hx    Immunodeficiency Neg Hx    Urticaria Neg Hx    Breast cancer Neg Hx     Past Surgical History:  Procedure Laterality Date   BIOPSY  06/11/2022   Procedure: BIOPSY;  Surgeon: Corbin Ade, MD;  [facility];  Service: Endoscopy;;   BREAST  BIOPSY Left 2018   FIBROCYSTIC CHANGES WITH FEATURES OF RUPTURE   CATARACT EXTRACTION     CHOLECYSTECTOMY N/A 05/26/2021   Procedure: LAPAROSCOPIC CHOLECYSTECTOMY;  Surgeon: Franky Macho, MD;  Location: [facility];  Service: General;  Laterality: N/A;   COLONOSCOPY N/A 10/10/2015   one 3mm polyp removed (benign colon mucosa), diverticulosis, internal hemorrhoids, next colonoscopy 09/2025. Procedure: COLONOSCOPY;  Surgeon: Corbin Ade, MD;  Location: [facility];  Service: Endoscopy;  Laterality: N/A;  245 - Interpreter scheduled, do NOT move   ESOPHAGOGASTRODUODENOSCOPY (EGD) WITH PROPOFOL N/A 06/11/2022   Procedure: ESOPHAGOGASTRODUODENOSCOPY (EGD) WITH PROPOFOL;  Surgeon: Corbin Ade, MD;  [facility]: [facility];  Service: Endoscopy;  Laterality: N/A;  8:15 am   GALLBLADDER SURGERY  05/26/2021   MALONEY DILATION N/A 06/11/2022   Procedure: Elease Hashimoto DILATION;  Surgeon: Corbin Ade, MD;  Location: [facility];  Service: Endoscopy;  Laterality: N/A;    ROS: Review of Systems Negative except as stated above  PHYSICAL EXAM: BP 105/67 (BP Location: Left Arm, Patient Position: Sitting, Cuff Size: Normal)   Pulse 66   Temp 98.5 F (36.9 C) (Oral)   Ht 5\' 1"  (1.549 m)   Wt 145 lb (65.8 kg)   SpO2 100%   BMI 27.40 kg/m   Wt Readings from Last 3 Encounters:  11/19/22 145 lb (65.8 kg)  06/11/22 155 lb (70.3 kg)  05/21/22 153 lb 6.4 oz (69.6 kg)    Physical Exam  General appearance - alert, well appearing, and in no distress Mental status - normal mood, behavior, speech, dress, motor activity, and thought processes Neck - supple, no significant adenopathy Chest - clear to auscultation, no wheezes, rales or rhonchi, symmetric air entry Heart - normal rate, regular rhythm, normal S1, S2, no murmurs, rubs, clicks or gallops Pelvic -CMA Clarissa is present: No bumps or abnormal lesions seen on the mons pubis or labia.  She has dry hyperpigmented waxy appearance of the skin lateral  to the right labia. Extremities - peripheral pulses normal, no pedal edema, no clubbing or cyanosis     11/19/2022    8:41 AM 05/21/2022   10:39 AM 01/15/2022   10:41 AM  Depression screen PHQ 2/9  Decreased Interest 0 0 0  Down, Depressed, Hopeless 0 0 0  PHQ - 2 Score 0 0 0  Altered sleeping   0  Tired, decreased energy   0  Change in appetite   0  Feeling bad or failure about yourself    0  Trouble concentrating   0  Moving slowly or fidgety/restless   0  Suicidal thoughts   0  PHQ-9 Score   0       Latest Ref Rng & Units 05/21/2022   11:41 AM 05/21/2022    8:54 AM 06/05/2021   11:21 AM  CMP  Glucose 70 - 99 mg/dL 81  83  91   BUN 8 - 27 mg/dL 10  11  9    Creatinine 0.57 - 1.00 mg/dL 1.61  0.96  0.45   Sodium 134 - 144 mmol/L 139  141  141   Potassium 3.5 - 5.2 mmol/L 4.7  4.5  4.6   Chloride 96 - 106 mmol/L 100  102  101   CO2 20 - 29 mmol/L 25  25  25    Calcium 8.7 - 10.3 mg/dL 40.9  81.1  9.8   Total Protein 6.0 - 8.5 g/dL 7.9  7.5  8.3   Total Bilirubin 0.0 - 1.2 mg/dL 0.3  0.3  0.3   Alkaline Phos 44 - 121 IU/L 121  124  137   AST 0 - 40 IU/L 17  20  19    ALT 0 - 32 IU/L 17  17  18     Lipid Panel     Component Value Date/Time   CHOL 223 (H) 05/21/2022 1141   TRIG 143 05/21/2022 1141   HDL 53 05/21/2022 1141   CHOLHDL 4.2 05/21/2022 1141   CHOLHDL 3.8 07/14/2015 0908   VLDL 18 07/14/2015 0908   LDLCALC 144 (H) 05/21/2022 1141    CBC    Component Value Date/Time   WBC 7.5  05/21/2022 1141   WBC 7.7 05/23/2021 1321   RBC 4.76 05/21/2022 1141   RBC 4.75 05/23/2021 1321   HGB 14.0 05/21/2022 1141   HCT 41.1 05/21/2022 1141   PLT 400 05/21/2022 1141   MCV 86 05/21/2022 1141   MCH 29.4 05/21/2022 1141   MCH 29.1 05/23/2021 1321   MCHC 34.1 05/21/2022 1141   MCHC 32.7 05/23/2021 1321   RDW 13.0 05/21/2022 1141   LYMPHSABS 2.4 05/21/2022 0854   MONOABS 0.8 05/23/2021 1321   EOSABS 0.1 05/21/2022 0854   BASOSABS 0.1 05/21/2022 0854    ASSESSMENT AND  PLAN:  1. Essential hypertension At goal.  Continue Norvasc 5 mg daily.  2. Acquired hypothyroidism Continue levothyroxine 112 mcg daily. - levothyroxine (SYNTHROID) 112 MCG tablet; TAKE 1 TABLET (112 MCG TOTAL) BY MOUTH DAILY.  Dispense: 90 tablet; Refill: 0 - TSH+T4F+T3Free  3. Vaginal itching Skin lateral to the right labia looks dry, mildly hyperpigmented and waxy.  We will give a trial of Mycolog cream.  Advised to follow-up with me if symptoms do not resolve. - Cervicovaginal ancillary only - nystatin-triamcinolone ointment (MYCOLOG); Apply to affected area twice a day x-7-10 days. Do not use inside vagina.  Dispense: 30 g; Refill: 0  4. Unintentional weight loss Likely due to smaller portions.  We will check some baseline blood test.  She is up-to-date with age-appropriate cancer screenings. - HIV antibody (with reflex) - CBC - Comprehensive metabolic panel  5. Need for shingles vaccine Agreeable to receiving Shingrix vaccine.  Printed prescription given to her to take to the pharmacy. - Zoster Vaccine Adjuvanted [facility]) injection; Inject 0.5 mLs into the muscle once for 1 dose.  Dispense: 0.5 mL; Refill: 0   Patient was given the opportunity to ask questions.  Patient verbalized understanding of the plan and was able to repeat key elements of the plan.   This documentation was completed using Paediatric nurse.  Any transcriptional errors are unintentional.  Orders Placed This Encounter  Procedures   TSH+T4F+T3Free   HIV antibody (with reflex)   CBC   Comprehensive metabolic panel     Requested Prescriptions   Signed Prescriptions Disp Refills   levothyroxine (SYNTHROID) 112 MCG tablet 90 tablet 0    Sig: TAKE 1 TABLET (112 MCG TOTAL) BY MOUTH DAILY.   Zoster Vaccine Adjuvanted [facility]) injection 0.5 mL 0    Sig: Inject 0.5 mLs into the muscle once for 1 dose.   nystatin-triamcinolone ointment (MYCOLOG) 30 g 0    Sig: Apply to affected  area twice a day for 7-10 days. Do not use inside vagina.    Return in about 4 months (around 03/21/2023) for chronic ds management.  Jonah Blue, MD, FACP

## 2022-11-20 LAB — COMPREHENSIVE METABOLIC PANEL
ALT: 16 IU/L (ref 0–32)
AST: 19 IU/L (ref 0–40)
Albumin: 4.6 g/dL (ref 3.9–4.9)
Alkaline Phosphatase: 122 IU/L — ABNORMAL HIGH (ref 44–121)
BUN/Creatinine Ratio: 17 (ref 12–28)
BUN: 12 mg/dL (ref 8–27)
Bilirubin Total: 0.2 mg/dL (ref 0.0–1.2)
CO2: 25 mmol/L (ref 20–29)
Calcium: 9.7 mg/dL (ref 8.7–10.3)
Chloride: 103 mmol/L (ref 96–106)
Creatinine, Ser: 0.71 mg/dL (ref 0.57–1.00)
Globulin, Total: 2.8 g/dL (ref 1.5–4.5)
Glucose: 87 mg/dL (ref 70–99)
Potassium: 4.4 mmol/L (ref 3.5–5.2)
Sodium: 141 mmol/L (ref 134–144)
Total Protein: 7.4 g/dL (ref 6.0–8.5)
eGFR: 94 mL/min/{1.73_m2} (ref >59)

## 2022-11-20 LAB — CERVICOVAGINAL ANCILLARY ONLY
Bacterial Vaginitis (gardnerella): NEGATIVE
Candida Glabrata: NEGATIVE
Candida Vaginitis: NEGATIVE
Chlamydia: NEGATIVE
Comment: NEGATIVE
Comment: NEGATIVE
Comment: NEGATIVE
Comment: NEGATIVE
Comment: NEGATIVE
Comment: NORMAL
Neisseria Gonorrhea: NEGATIVE
Trichomonas: NEGATIVE

## 2022-11-20 LAB — CBC
Hematocrit: 41.1 % (ref 34.0–46.6)
Hemoglobin: 13.4 g/dL (ref 11.1–15.9)
MCH: 28.8 pg (ref 26.6–33.0)
MCHC: 32.6 g/dL (ref 31.5–35.7)
MCV: 88 fL (ref 79–97)
Platelets: 360 10*3/uL (ref 150–450)
RBC: 4.65 x10E6/uL (ref 3.77–5.28)
RDW: 13.1 % (ref 11.7–15.4)
WBC: 8.4 10*3/uL (ref 3.4–10.8)

## 2022-11-20 LAB — TSH+T4F+T3FREE
Free T4: 1.01 ng/dL (ref 0.82–1.77)
T3, Free: 1.7 pg/mL — ABNORMAL LOW (ref 2.0–4.4)
TSH: 10 u[IU]/mL — ABNORMAL HIGH (ref 0.450–4.500)

## 2022-11-20 LAB — HIV ANTIBODY (ROUTINE TESTING W REFLEX): HIV Screen 4th Generation wRfx: NONREACTIVE

## 2022-11-20 NOTE — Progress Notes (Signed)
Let patient know that her thyroid level is abnormal.  If she has been taking the levothyroxine 112 mcg daily consistently, then we need to increase the dose to 125 mcg daily.  Please let me know so that I know whether to send an updated prescription to the pharmacy for the increased dose.  If we do have to increase the dose, she should return to the lab after being on the increased dose for 1 month for recheck.  Kidney and liver function tests are good.  Blood cell counts are normal.  Screen for HIV is negative.  Vaginal cultures are negative.

## 2022-11-21 ENCOUNTER — Other Ambulatory Visit: Payer: Self-pay | Admitting: Internal Medicine

## 2022-11-21 DIAGNOSIS — E039 Hypothyroidism, unspecified: Secondary | ICD-10-CM

## 2022-11-21 MED ORDER — LEVOTHYROXINE SODIUM 137 MCG PO TABS
137.0000 ug | ORAL_TABLET | Freq: Every day | ORAL | 2 refills | Status: DC
  Filled 2022-11-21: qty 30, 30d supply, fill #0
  Filled 2022-12-24: qty 30, 30d supply, fill #1
  Filled 2023-01-31 (×2): qty 30, 30d supply, fill #2

## 2022-11-22 ENCOUNTER — Other Ambulatory Visit: Payer: Self-pay

## 2022-11-28 ENCOUNTER — Other Ambulatory Visit: Payer: Self-pay

## 2022-12-17 ENCOUNTER — Other Ambulatory Visit: Payer: Self-pay

## 2022-12-24 ENCOUNTER — Other Ambulatory Visit: Payer: Self-pay

## 2022-12-28 ENCOUNTER — Ambulatory Visit: Payer: Self-pay | Attending: Internal Medicine

## 2022-12-28 DIAGNOSIS — E039 Hypothyroidism, unspecified: Secondary | ICD-10-CM

## 2022-12-29 LAB — TSH: TSH: 0.666 u[IU]/mL (ref 0.450–4.500)

## 2023-01-01 ENCOUNTER — Other Ambulatory Visit: Payer: Self-pay

## 2023-01-01 ENCOUNTER — Encounter: Payer: Self-pay | Admitting: Nurse Practitioner

## 2023-01-01 ENCOUNTER — Ambulatory Visit: Payer: Self-pay | Attending: Nurse Practitioner | Admitting: Nurse Practitioner

## 2023-01-01 VITALS — BP 122/75 | HR 70 | Ht 61.0 in | Wt 150.2 lb

## 2023-01-01 DIAGNOSIS — G43009 Migraine without aura, not intractable, without status migrainosus: Secondary | ICD-10-CM

## 2023-01-01 MED ORDER — TOPIRAMATE 25 MG PO TABS
25.0000 mg | ORAL_TABLET | Freq: Two times a day (BID) | ORAL | 0 refills | Status: DC
  Filled 2023-01-01: qty 60, 30d supply, fill #0

## 2023-01-01 NOTE — Patient Instructions (Addendum)
Topamax 25 mg bid for headache OTC Tylenol as needed Keep appointment with ophthalmology

## 2023-01-01 NOTE — Progress Notes (Signed)
Acute Office Visit  Subjective:     Patient ID: Alexandra Brewer, female    DOB: [birth year], 67 y.o.   MRN: [id number]  Chief Complaint  Patient presents with   Headache    Assisted by Gabriel Cirri # [phone].   Patient presents w/ complaints of initial right ear pain and right headache that started on Saturday. She says the ear pain has gotten better. However her headache has remained. She says her headache is "on the top of her head" and feels like a poking/throbbing sensation. She rates her headache as 10/10. Patient says her headache is worse when she bends down or changing the position of her head. Patient has taken meloxicam for pain with little relief. She is to see opthalmology next week. She does have a history Localized inf RD with retinal tear, left eye.  Headache  Associated symptoms include blurred vision, dizziness, ear pain and photophobia. Pertinent negatives include no eye pain, eye redness, nausea or vomiting.   Patient is in today for Right frontal and parietal headache along with right ear pain   Review of Systems  Constitutional: Negative.   HENT:  Positive for ear pain.        Right ear pain on Saturday; better now  Eyes:  Positive for blurred vision and photophobia. Negative for double vision, pain, discharge and redness.       Blurred vision left eye; recent surgery Photosenstitivity left eye; still wears surgical glasses  Respiratory:  Positive for shortness of breath.        SOB when allergies flare up; relieved when uses albuterol inhaler and nasal spray  Cardiovascular: Negative.   Gastrointestinal:  Negative for nausea and vomiting.  Neurological:  Positive for dizziness and headaches.       Right headache worse when she bends down, moving head;pain rated 10/10 Intermittent light headed when "stressed" x 3 months  Endo/Heme/Allergies:  Positive for environmental allergies.        Objective:    BP 122/75 (BP Location: Left Arm, Patient  Position: Sitting, Cuff Size: Normal)   Pulse 70   Ht 5\' 1"  (1.549 m)   Wt 150 lb 3.2 oz (68.1 kg)   SpO2 100%   BMI 28.38 kg/m  BP Readings from Last 3 Encounters:  01/01/23 122/75  11/19/22 105/67  09/05/22 112/70   Wt Readings from Last 3 Encounters:  01/01/23 150 lb 3.2 oz (68.1 kg)  11/19/22 145 lb (65.8 kg)  06/11/22 155 lb (70.3 kg)      Physical Exam Vitals reviewed.  HENT:     Head: Normocephalic.     Right Ear: Hearing and tympanic membrane normal. No middle ear effusion. No hemotympanum. Tympanic membrane is not perforated or erythematous.     Left Ear: Hearing and tympanic membrane normal.  No middle ear effusion. No hemotympanum. Tympanic membrane is not perforated or erythematous.     Nose: Nose normal.     Comments: No sinus tenderness to palpation    Mouth/Throat:     Mouth: Mucous membranes are moist.     Pharynx: Oropharynx is clear. Uvula midline.     Tonsils: 0 on the right. 0 on the left.  Cardiovascular:     Rate and Rhythm: Normal rate and regular rhythm.     Heart sounds: Normal heart sounds.  Pulmonary:     Effort: Pulmonary effort is normal.     Breath sounds: Normal breath sounds.  Musculoskeletal:     Cervical back:  Normal range of motion.  Neurological:     Mental Status: She is alert and oriented to person, place, and time.     No results found for any visits on 01/01/23.      Assessment & Plan:   Problem List Items Addressed This Visit       Cardiovascular and Mediastinum   Migraine without aura and without status migrainosus, not intractable - Primary    Headache described as "poking" consistently right temporal region. Symptoms consistent with migraine. Plan for Topomax 25 mg PO bid.      Relevant Medications   topiramate (TOPAMAX) 25 MG tablet    Meds ordered this encounter  Medications   topiramate (TOPAMAX) 25 MG tablet    Sig: Take 1 tablet (25 mg total) by mouth 2 (two) times daily.    Dispense:  60 tablet     Refill:  0    Return if symptoms worsen or fail to improve. Burnard Bunting, SFNP. [facility]  Claiborne Rigg, NP I have seen and examined this patient with the FNP student and agree with the above note .

## 2023-01-01 NOTE — Assessment & Plan Note (Signed)
Headache described as "poking" consistently right temporal region. Symptoms consistent with migraine. Plan for Topomax 25 mg PO bid.

## 2023-01-21 ENCOUNTER — Other Ambulatory Visit: Payer: Self-pay

## 2023-01-31 ENCOUNTER — Other Ambulatory Visit: Payer: Self-pay

## 2023-01-31 ENCOUNTER — Other Ambulatory Visit: Payer: Self-pay | Admitting: Internal Medicine

## 2023-01-31 DIAGNOSIS — I10 Essential (primary) hypertension: Secondary | ICD-10-CM

## 2023-01-31 MED ORDER — AMLODIPINE BESYLATE 5 MG PO TABS
5.0000 mg | ORAL_TABLET | Freq: Every day | ORAL | 2 refills | Status: DC
  Filled 2023-01-31: qty 90, 90d supply, fill #0
  Filled 2023-06-19 (×2): qty 90, 90d supply, fill #1

## 2023-01-31 NOTE — Telephone Encounter (Signed)
Requested Prescriptions  Pending Prescriptions Disp Refills   amLODipine (NORVASC) 5 MG tablet 90 tablet 2    Sig: Take 1 tablet (5 mg total) by mouth daily.     Cardiovascular: Calcium Channel Blockers 2 Passed - 01/31/2023  9:05 AM      Passed - Last BP in normal range    BP Readings from Last 1 Encounters:  01/01/23 122/75         Passed - Last Heart Rate in normal range    Pulse Readings from Last 1 Encounters:  01/01/23 70         Passed - Valid encounter within last 6 months    Recent Outpatient Visits           1 month ago Migraine without aura and without status migrainosus, not intractable   [place] [facility] Wallace, Shea Stakes, NP   2 months ago Essential hypertension   [place] [facility] Marcine Matar, MD   8 months ago Essential hypertension   [place] [facility] Marcine Matar, MD   1 year ago Essential hypertension   [place] [facility] Marcine Matar, MD   1 year ago Essential hypertension   [place] [facility] Marcine Matar, MD       Future Appointments             In 1 month Padgett, Pilar Grammes, MD [facility] [place]   In 1 month Marcine Matar, MD [facility]

## 2023-02-11 ENCOUNTER — Telehealth: Payer: Self-pay

## 2023-02-11 NOTE — Telephone Encounter (Signed)
Telephoned patient using language line interpreter#[phone]. Left voice message with [facility] contact information.

## 2023-02-12 ENCOUNTER — Telehealth: Payer: Self-pay

## 2023-02-12 NOTE — Telephone Encounter (Signed)
Telephoned patient at mobile number using interpreter, Orie Fisherman. Left a voice message with [facility] contact information.

## 2023-03-07 ENCOUNTER — Ambulatory Visit: Payer: Self-pay | Admitting: Allergy

## 2023-03-08 ENCOUNTER — Other Ambulatory Visit: Payer: Self-pay | Admitting: Internal Medicine

## 2023-03-08 ENCOUNTER — Other Ambulatory Visit: Payer: Self-pay

## 2023-03-08 DIAGNOSIS — E039 Hypothyroidism, unspecified: Secondary | ICD-10-CM

## 2023-03-08 MED ORDER — LEVOTHYROXINE SODIUM 137 MCG PO TABS
137.0000 ug | ORAL_TABLET | Freq: Every day | ORAL | 2 refills | Status: DC
  Filled 2023-03-08: qty 30, 30d supply, fill #0

## 2023-03-12 ENCOUNTER — Other Ambulatory Visit: Payer: Self-pay | Admitting: Obstetrics and Gynecology

## 2023-03-12 DIAGNOSIS — Z1231 Encounter for screening mammogram for malignant neoplasm of breast: Secondary | ICD-10-CM

## 2023-03-19 ENCOUNTER — Other Ambulatory Visit: Payer: Self-pay

## 2023-03-19 ENCOUNTER — Other Ambulatory Visit: Payer: Self-pay | Admitting: Internal Medicine

## 2023-03-19 DIAGNOSIS — K219 Gastro-esophageal reflux disease without esophagitis: Secondary | ICD-10-CM

## 2023-03-19 MED ORDER — PANTOPRAZOLE SODIUM 40 MG PO TBEC
40.0000 mg | DELAYED_RELEASE_TABLET | Freq: Every day | ORAL | 0 refills | Status: DC
  Filled 2023-03-19: qty 30, 30d supply, fill #0

## 2023-03-21 ENCOUNTER — Encounter: Payer: Self-pay | Admitting: Internal Medicine

## 2023-03-21 ENCOUNTER — Other Ambulatory Visit: Payer: Self-pay

## 2023-03-21 ENCOUNTER — Ambulatory Visit: Payer: Self-pay | Attending: Internal Medicine | Admitting: Internal Medicine

## 2023-03-21 VITALS — BP 118/66 | HR 71 | Temp 98.1°F | Ht 61.0 in | Wt 146.0 lb

## 2023-03-21 DIAGNOSIS — E039 Hypothyroidism, unspecified: Secondary | ICD-10-CM

## 2023-03-21 DIAGNOSIS — I10 Essential (primary) hypertension: Secondary | ICD-10-CM

## 2023-03-21 DIAGNOSIS — K219 Gastro-esophageal reflux disease without esophagitis: Secondary | ICD-10-CM

## 2023-03-21 DIAGNOSIS — Z23 Encounter for immunization: Secondary | ICD-10-CM

## 2023-03-21 DIAGNOSIS — R1013 Epigastric pain: Secondary | ICD-10-CM

## 2023-03-21 DIAGNOSIS — Z2821 Immunization not carried out because of patient refusal: Secondary | ICD-10-CM

## 2023-03-21 MED ORDER — ONDANSETRON HCL 4 MG PO TABS
4.0000 mg | ORAL_TABLET | Freq: Every day | ORAL | 0 refills | Status: DC | PRN
  Filled 2023-03-21: qty 10, 10d supply, fill #0

## 2023-03-21 MED ORDER — PANTOPRAZOLE SODIUM 40 MG PO TBEC
DELAYED_RELEASE_TABLET | ORAL | 1 refills | Status: DC
  Filled 2023-03-21: qty 60, fill #0

## 2023-03-21 MED ORDER — ZOSTER VAC RECOMB ADJUVANTED 50 MCG/0.5ML IM SUSR: 0.5000 mL | Freq: Once | INTRAMUSCULAR | 0 refills | Status: AC

## 2023-03-21 NOTE — Progress Notes (Signed)
Patient ID: Alexandra Brewer, female    DOB: 1956/02/28  MRN: [id number]  CC: Hypertension (HTN f/u. Med refill. [facility] concerns due to increased acid reflux, fever, stomach pain, nausea, fatigue - waiting for CAFA approval to make appt with gatro/Flu vax administered on 03/21/2023 - C.A. No to shingles vax.)   Subjective: Alexandra Brewer is a 67 y.o. female who presents for chronic ds management. Her concerns today include:  HTN, thyroid ds, HL, GERD, Frozen shoulder RT, asthma mild persistent and food allergy (followed by allergy/asthma ctr)   [employer] Language interpreter used during this encounter. #Arturo [id number]  Pt concern about stomach issues x 6 days.  Reports increase acid reflux symptoms including burning in stomach.  Pain "in the pit of the stomach."  Worse when she eats greasy foods. Nausea yesterday with decrease oral intake.  She has not eaten as yet for the morning; drank some juice.  Subjective feeling fever and throat was hurting.  Took allergy pill and throat pain went away.   Had gall bladder removed 2022.  Had seen GI earlier this yr for dysphagia and had EGD done with dilation of lower esophagus -taking Pantoprazole daily as prescribed. -not on any OTC NSAIDs  HTN: taking Norvasc and took already for the a.m.  Also reports compliance with Levothyroxine 125 mcg daily  HM:  yes to flu shot.  Needs 2nd shigles vacc but not sure pharmacy will give it without her having [facility].  She has applied for the [facility] already and waiting for it to be process. Patient Active Problem List   Diagnosis Date Noted   Elevated LFTs 05/18/2022   RUQ pain 05/18/2022   Abdominal pain, epigastric 05/16/2021   Esophageal dysphagia 05/16/2021   Calculus of gallbladder without cholecystitis without obstruction 01/27/2021   Fatty liver 01/27/2021   Class 1 obesity due to excess calories with serious comorbidity and body mass index (BMI) of 31.0 to 31.9 in adult 01/27/2021    Long term (current) use of non-steroidal anti-inflammatories (nsaid) 01/12/2021   Migraine without aura and without status migrainosus, not intractable 05/31/2020   Abdominal pain 03/10/2018   Sciatica of right side 06/11/2017   RUQ abdominal pain 01/23/2017   GERD (gastroesophageal reflux disease) 11/26/2016   Right Achilles tendinitis 08/15/2016   Hypertension 07/05/2016   Adhesive capsulitis of right shoulder 02/21/2016   Heel pain, bilateral 01/10/2016   Varicose veins of left lower extremity with pain 01/10/2016   History of colonic polyps    Diverticulosis of colon without hemorrhage    Hypothyroidism 08/10/2015   Chronic constipation 08/05/2015   Hyperlipidemia 08/05/2015     Current Outpatient Medications on File Prior to Visit  Medication Sig Dispense Refill   albuterol (VENTOLIN HFA) 108 (90 Base) MCG/ACT inhaler Inhale 2 puffs into the lungs every 6 (six) hours as needed for wheezing or shortness of breath. 6.7 g 1   amLODipine (NORVASC) 5 MG tablet Take 1 tablet (5 mg total) by mouth daily. 90 tablet 2   dexamethasone (DECADRON) 0.1 % ophthalmic suspension Place 1 drop into both eyes daily.     EPINEPHrine 0.3 mg/0.3 mL IJ SOAJ injection Inject 0.3 mg into the muscle as needed for anaphylaxis. 2 each 1   Fluticasone Furoate (ARNUITY ELLIPTA) 100 MCG/ACT AEPB Inhale 1 puff into the lungs daily. 30 each 5   ipratropium (ATROVENT) 0.06 % nasal spray Place 2 sprays into both nostrils 2 (two) times daily as needed for rhinitis. 15 mL  5   levocetirizine (XYZAL) 5 MG tablet Take 1 tablet (5 mg total) by mouth in the morning and at bedtime. 60 tablet 5   levothyroxine (SYNTHROID) 137 MCG tablet Take 1 tablet (137 mcg total) by mouth daily. 30 tablet 2   meloxicam (MOBIC) 15 MG tablet Take 1 tablet (15 mg total) by mouth daily. 30 tablet 2   montelukast (SINGULAIR) 10 MG tablet Take 1 tablet (10 mg total) by mouth at bedtime. 30 tablet 5   Wheat Dextrin (BENEFIBER) POWD Take by  mouth daily. Two tablespoons     topiramate (TOPAMAX) 25 MG tablet Take 1 tablet (25 mg total) by mouth 2 (two) times daily. 60 tablet 0   No current facility-administered medications on file prior to visit.    Allergies  Allergen Reactions   Fish Allergy     Throat swell up   Naproxen Rash   Shellfish Allergy     Throat swell up   Penicillins Hives    Social History   Socioeconomic History   Marital status: Single    Spouse name: Not on file   Number of children: 1   Years of education: Not on file   Highest education level: 3rd grade  Occupational History   Not on file  Tobacco Use   Smoking status: Never   Smokeless tobacco: Never  Vaping Use   Vaping status: Never Used  Substance and Sexual Activity   Alcohol use: No    Alcohol/week: 0.0 standard drinks of alcohol   Drug use: No   Sexual activity: Yes    Birth control/protection: Post-menopausal  Other Topics Concern   Not on file  Social History Narrative   Not on file   Social Drivers of Health   Financial Resource Strain: Not on file  Food Insecurity: No Food Insecurity (02/08/2022)   Hunger Vital Sign    Worried About Running Out of Food in the Last Year: Never true    Ran Out of Food in the Last Year: Never true  Transportation Needs: Unmet Transportation Needs (02/08/2022)   PRAPARE - Administrator, Civil Service (Medical): Yes    Lack of Transportation (Non-Medical): Yes  Physical Activity: Not on file  Stress: Not on file  Social Connections: Not on file  Intimate Partner Violence: Not on file    Family History  Problem Relation Age of Onset   Allergic rhinitis Mother    Diabetes Father    Hypertension Father    Asthma Father    Stroke Maternal Grandmother    Stroke Maternal Grandfather    Colon cancer Neg Hx    Angioedema Neg Hx    Atopy Neg Hx    Immunodeficiency Neg Hx    Urticaria Neg Hx    Breast cancer Neg Hx     Past Surgical History:  Procedure Laterality Date    BIOPSY  06/11/2022   Procedure: BIOPSY;  Surgeon: Corbin Ade, MD;  [facility];  Service: Endoscopy;;   BREAST BIOPSY Left 2018   FIBROCYSTIC CHANGES WITH FEATURES OF RUPTURE   CATARACT EXTRACTION     CHOLECYSTECTOMY N/A 05/26/2021   Procedure: LAPAROSCOPIC CHOLECYSTECTOMY;  Surgeon: Franky Macho, MD;  Location: [facility];  Service: General;  Laterality: N/A;   COLONOSCOPY N/A 10/10/2015   one 3mm polyp removed (benign colon mucosa), diverticulosis, internal hemorrhoids, next colonoscopy 09/2025. Procedure: COLONOSCOPY;  Surgeon: Corbin Ade, MD;  [facility];  Service: Endoscopy;  Laterality: N/A;  245 - Interpreter scheduled, do NOT move   ESOPHAGOGASTRODUODENOSCOPY (EGD) WITH PROPOFOL N/A 06/11/2022   Procedure: ESOPHAGOGASTRODUODENOSCOPY (EGD) WITH PROPOFOL;  Surgeon: Corbin Ade, MD;  [facility];  Service: Endoscopy;  Laterality: N/A;  8:15 am   GALLBLADDER SURGERY  05/26/2021   MALONEY DILATION N/A 06/11/2022   Procedure: Elease Hashimoto DILATION;  Surgeon: Corbin Ade, MD;  [facility]: [facility];  Service: Endoscopy;  Laterality: N/A;    ROS: Review of Systems Negative except as stated above  PHYSICAL EXAM: BP 118/66 (BP Location: Left Arm, Patient Position: Sitting)   Pulse 71   Temp 98.1 F (36.7 C) (Oral)   Ht 5\' 1"  (1.549 m)   Wt 146 lb (66.2 kg)   SpO2 100%   BMI 27.59 kg/m   Wt Readings from Last 3 Encounters:  03/21/23 146 lb (66.2 kg)  01/01/23 150 lb 3.2 oz (68.1 kg)  11/19/22 145 lb (65.8 kg)    Physical Exam  General appearance - alert, well appearing, and in no distress Mental status - normal mood, behavior, speech, dress, motor activity, and thought processes Mouth - mucous membranes moist, pharynx normal without lesions Chest - clear to auscultation, no wheezes, rales or rhonchi, symmetric air entry Heart - normal rate, regular rhythm, normal S1, S2, no murmurs, rubs, clicks or gallops Abdomen: Nondistended,  normal bowel sounds, soft, mild epigastric tenderness without guarding or rebound. Extremities - peripheral pulses normal, no pedal edema, no clubbing or cyanosis      Latest Ref Rng & Units 11/19/2022    9:52 AM 05/21/2022   11:41 AM 05/21/2022    8:54 AM  CMP  Glucose 70 - 99 mg/dL 87  81  83   BUN 8 - 27 mg/dL 12  10  11    Creatinine 0.57 - 1.00 mg/dL 1.61  0.96  0.45   Sodium 134 - 144 mmol/L 141  139  141   Potassium 3.5 - 5.2 mmol/L 4.4  4.7  4.5   Chloride 96 - 106 mmol/L 103  100  102   CO2 20 - 29 mmol/L 25  25  25    Calcium 8.7 - 10.3 mg/dL 9.7  40.9  81.1   Total Protein 6.0 - 8.5 g/dL 7.4  7.9  7.5   Total Bilirubin 0.0 - 1.2 mg/dL 0.2  0.3  0.3   Alkaline Phos 44 - 121 IU/L 122  121  124   AST 0 - 40 IU/L 19  17  20    ALT 0 - 32 IU/L 16  17  17     Lipid Panel     Component Value Date/Time   CHOL 223 (H) 05/21/2022 1141   TRIG 143 05/21/2022 1141   HDL 53 05/21/2022 1141   CHOLHDL 4.2 05/21/2022 1141   CHOLHDL 3.8 07/14/2015 0908   VLDL 18 07/14/2015 0908   LDLCALC 144 (H) 05/21/2022 1141    CBC    Component Value Date/Time   WBC 8.4 11/19/2022 0952   WBC 7.7 05/23/2021 1321   RBC 4.65 11/19/2022 0952   RBC 4.75 05/23/2021 1321   HGB 13.4 11/19/2022 0952   HCT 41.1 11/19/2022 0952   PLT 360 11/19/2022 0952   MCV 88 11/19/2022 0952   MCH 28.8 11/19/2022 0952   MCH 29.1 05/23/2021 1321   MCHC 32.6 11/19/2022 0952   MCHC 32.7 05/23/2021 1321   RDW 13.1 11/19/2022 0952   LYMPHSABS 2.4 05/21/2022 0854   MONOABS 0.8 05/23/2021 1321  EOSABS 0.1 05/21/2022 0854   BASOSABS 0.1 05/21/2022 0854    ASSESSMENT AND PLAN: 1. Gastroesophageal reflux disease without esophagitis (Primary) GERD precautions discussed.  Advised to avoid certain foods like spicy foods, tomato-based foods, juices and excessive caffeine.  Advised to eat his last meal at least 2 to 3 hours before laying down at nights and to sleep with his head slightly elevated.   -Advised to avoid  greasy foods as well. Increase pantoprazole to twice daily dosing for 1 month then back to once a day. - pantoprazole (PROTONIX) 40 MG tablet; 1 tab PO twice before breakfast and dinner x 1 mth then 1 tab PO daily  Dispense: 60 tablet; Refill: 1  2. Abdominal pain, epigastric See #1 above. Advised to be seen in the emergency room if any worsening over the weekend. - ondansetron (ZOFRAN) 4 MG tablet; Take 1 tablet (4 mg total) by mouth daily as needed for nausea or vomiting.  Dispense: 10 tablet; Refill: 0 - Lipase - Comprehensive metabolic panel - CBC  3. Essential hypertension At goal.  Continue Norvasc 5 mg daily.  4. Acquired hypothyroidism Continue current dose of levothyroxine - TSH  5. Encounter for immunization - Flu vaccine trivalent PF, 6mos and older(Flulaval,Afluria,Fluarix,Fluzone)  6. Herpes zoster vaccination declined - Zoster Vaccine Adjuvanted [facility]) injection; Inject 0.5 mLs into the muscle once for 1 dose.  Dispense: 0.5 mL; Refill: 0    Patient was given the opportunity to ask questions.  Patient verbalized understanding of the plan and was able to repeat key elements of the plan.   This documentation was completed using Paediatric nurse.  Any transcriptional errors are unintentional.  Orders Placed This Encounter  Procedures   Flu vaccine trivalent PF, 6mos and older(Flulaval,Afluria,Fluarix,Fluzone)   TSH   Lipase   Comprehensive metabolic panel   CBC     Requested Prescriptions   Signed Prescriptions Disp Refills   pantoprazole (PROTONIX) 40 MG tablet 60 tablet 1    Sig: Take 1 tablet (40 mg total) by mouth 2 (two) times daily(breakfast and supper) for 30 days, THEN 1 tablet (40 mg total) daily.   Zoster Vaccine Adjuvanted [facility]) injection 0.5 mL 0    Sig: Inject 0.5 mLs into the muscle once for 1 dose.   ondansetron (ZOFRAN) 4 MG tablet 10 tablet 0    Sig: Take 1 tablet (4 mg total) by mouth daily as needed for nausea  or vomiting.    Return in about 4 months (around 07/20/2023).  Jonah Blue, MD, FACP

## 2023-03-21 NOTE — Patient Instructions (Addendum)
Increase Pantoprazole to 40 mg twice a day x 1 month then back to 1 tab daily. Avoid spicy, greasy foods.  Avid juices, foods with tomatoes and tomato sauce.  Be seen in ER if any worsening over the weekend.

## 2023-03-22 ENCOUNTER — Encounter: Payer: No Typology Code available for payment source | Admitting: Gastroenterology

## 2023-03-22 LAB — CBC
Hematocrit: 39 % (ref 34.0–46.6)
Hemoglobin: 12.7 g/dL (ref 11.1–15.9)
MCH: 28.7 pg (ref 26.6–33.0)
MCHC: 32.6 g/dL (ref 31.5–35.7)
MCV: 88 fL (ref 79–97)
Platelets: 381 10*3/uL (ref 150–450)
RBC: 4.42 x10E6/uL (ref 3.77–5.28)
RDW: 12.5 % (ref 11.7–15.4)
WBC: 8.8 10*3/uL (ref 3.4–10.8)

## 2023-03-22 LAB — COMPREHENSIVE METABOLIC PANEL
ALT: 22 [IU]/L (ref 0–32)
AST: 25 [IU]/L (ref 0–40)
Albumin: 4.4 g/dL (ref 3.9–4.9)
Alkaline Phosphatase: 120 [IU]/L (ref 44–121)
BUN/Creatinine Ratio: 14 (ref 12–28)
BUN: 10 mg/dL (ref 8–27)
Bilirubin Total: 0.6 mg/dL (ref 0.0–1.2)
CO2: 24 mmol/L (ref 20–29)
Calcium: 9.5 mg/dL (ref 8.7–10.3)
Chloride: 104 mmol/L (ref 96–106)
Creatinine, Ser: 0.7 mg/dL (ref 0.57–1.00)
Globulin, Total: 2.8 g/dL (ref 1.5–4.5)
Glucose: 80 mg/dL (ref 70–99)
Potassium: 3.9 mmol/L (ref 3.5–5.2)
Sodium: 141 mmol/L (ref 134–144)
Total Protein: 7.2 g/dL (ref 6.0–8.5)
eGFR: 95 mL/min/{1.73_m2} (ref >59)

## 2023-03-22 LAB — LIPASE: Lipase: 30 U/L (ref 14–72)

## 2023-03-22 LAB — TSH: TSH: 0.395 u[IU]/mL — ABNORMAL LOW (ref 0.450–4.500)

## 2023-03-22 NOTE — Progress Notes (Deleted)
GI Office Note    Referring Provider: Marcine Matar, MD Primary Care Physician:  Marcine Matar, MD  Primary Gastroenterologist: Roetta Sessions, MD   Chief Complaint   No chief complaint on file.   History of Present Illness   Alexandra Brewer is a 67 y.o. female presenting today for follow up. Last seen 05/2022. H/o GERD, constipatoin. Seen by PCP yesterday for the same.   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 PCP advised her to avoid red meats and prescribed Epi-Pen. Patient has been consume some beef. She did not understand what "red meat" meant.     History of mildly elevated AlkPhos dating back to 2020.    H. pylori serologies October 2022 were negative.  No prior EGD.  Abdominal ultrasound October 2022 for right-sided abdominal pain showed stones and sludge in the gallbladder but no gallbladder wall thickening or pericholecystic fluid.  Hepatic steatosis suspected.  LFTs at that time with mildly elevated alkaline phosphatase of 129.   Colonoscopy 10/2015: - One 3 mm polyp in the cecum, removed with a cold snare. Resected and retrieved. - Diverticulosis in the entire examined colon. - Internal hemorrhoids. -benign colonic mucosa -next colonoscopy in 09/2025      EGD 06/2022: -subjectively mildly narrowed tubular esophagus with ringed appearance. S/p bx, c/w reflux      Medications   Current Outpatient Medications  Medication Sig Dispense Refill   albuterol (VENTOLIN HFA) 108 (90 Base) MCG/ACT inhaler Inhale 2 puffs into the lungs every 6 (six) hours as needed for wheezing or shortness of breath. 6.7 g 1   amLODipine (NORVASC) 5 MG tablet Take 1 tablet (5 mg total) by mouth daily. 90 tablet 2   dexamethasone (DECADRON) 0.1 % ophthalmic suspension Place 1 drop into both eyes daily.     EPINEPHrine 0.3 mg/0.3 mL IJ SOAJ injection Inject 0.3 mg into the muscle as needed for anaphylaxis. 2 each 1   Fluticasone Furoate  (ARNUITY ELLIPTA) 100 MCG/ACT AEPB Inhale 1 puff into the lungs daily. 30 each 5   ipratropium (ATROVENT) 0.06 % nasal spray Place 2 sprays into both nostrils 2 (two) times daily as needed for rhinitis. 15 mL 5   levocetirizine (XYZAL) 5 MG tablet Take 1 tablet (5 mg total) by mouth in the morning and at bedtime. 60 tablet 5   levothyroxine (SYNTHROID) 137 MCG tablet Take 1 tablet (137 mcg total) by mouth daily. 30 tablet 2   meloxicam (MOBIC) 15 MG tablet Take 1 tablet (15 mg total) by mouth daily. 30 tablet 2   montelukast (SINGULAIR) 10 MG tablet Take 1 tablet (10 mg total) by mouth at bedtime. 30 tablet 5   ondansetron (ZOFRAN) 4 MG tablet Take 1 tablet (4 mg total) by mouth daily as needed for nausea or vomiting. 10 tablet 0   pantoprazole (PROTONIX) 40 MG tablet Take 1 tablet (40 mg total) by mouth 2 (two) times daily(breakfast and supper) for 30 days, THEN 1 tablet (40 mg total) daily. 60 tablet 1   topiramate (TOPAMAX) 25 MG tablet Take 1 tablet (25 mg total) by mouth 2 (two) times daily. 60 tablet 0   Wheat Dextrin (BENEFIBER) POWD Take by mouth daily. Two tablespoons     No current facility-administered medications for this visit.    Allergies   Allergies as of 03/22/2023 - Review Complete 03/21/2023  Allergen Reaction Noted   Fish allergy  06/11/2017   Naproxen Rash 06/29/2019   Shellfish  allergy  06/11/2017   Penicillins Hives 07/20/2015     Past Medical History   Past Medical History:  Diagnosis Date   Asthma    Constipation    Hyperlipemia    Hypertension    Hypothyroidism    Sciatica    Thyroid disease    Urticaria     Past Surgical History   Past Surgical History:  Procedure Laterality Date   BIOPSY  06/11/2022   Procedure: BIOPSY;  Surgeon: Corbin Ade, MD;  [facility];  Service: Endoscopy;;   BREAST BIOPSY Left 2018   FIBROCYSTIC CHANGES WITH FEATURES OF RUPTURE   CATARACT EXTRACTION     CHOLECYSTECTOMY N/A 05/26/2021   Procedure:  LAPAROSCOPIC CHOLECYSTECTOMY;  Surgeon: Franky Macho, MD;  Location: [facility];  Service: General;  Laterality: N/A;   COLONOSCOPY N/A 10/10/2015   one 3mm polyp removed (benign colon mucosa), diverticulosis, internal hemorrhoids, next colonoscopy 09/2025. Procedure: COLONOSCOPY;  Surgeon: Corbin Ade, MD;  [facility];  Service: Endoscopy;  Laterality: N/A;  245 - Interpreter scheduled, do NOT move   ESOPHAGOGASTRODUODENOSCOPY (EGD) WITH PROPOFOL N/A 06/11/2022   Procedure: ESOPHAGOGASTRODUODENOSCOPY (EGD) WITH PROPOFOL;  Surgeon: Corbin Ade, MD;  [facility];  Service: Endoscopy;  Laterality: N/A;  8:15 am   GALLBLADDER SURGERY  05/26/2021   MALONEY DILATION N/A 06/11/2022   Procedure: Elease Hashimoto DILATION;  Surgeon: Corbin Ade, MD;  [facility];  Service: Endoscopy;  Laterality: N/A;    Past Family History   Family History  Problem Relation Age of Onset   Allergic rhinitis Mother    Diabetes Father    Hypertension Father    Asthma Father    Stroke Maternal Grandmother    Stroke Maternal Grandfather    Colon cancer Neg Hx    Angioedema Neg Hx    Atopy Neg Hx    Immunodeficiency Neg Hx    Urticaria Neg Hx    Breast cancer Neg Hx     Past Social History   Social History   Socioeconomic History   Marital status: Single    Spouse name: Not on file   Number of children: 1   Years of education: Not on file   Highest education level: 3rd grade  Occupational History   Not on file  Tobacco Use   Smoking status: Never   Smokeless tobacco: Never  Vaping Use   Vaping status: Never Used  Substance and Sexual Activity   Alcohol use: No    Alcohol/week: 0.0 standard drinks of alcohol   Drug use: No   Sexual activity: Yes    Birth control/protection: Post-menopausal  Other Topics Concern   Not on file  Social History Narrative   Not on file   Social Drivers of Health   Financial Resource Strain: Not on file  Food Insecurity: No Food  Insecurity (02/08/2022)   Hunger Vital Sign    Worried About Running Out of Food in the Last Year: Never true    Ran Out of Food in the Last Year: Never true  Transportation Needs: Unmet Transportation Needs (02/08/2022)   PRAPARE - Administrator, Civil Service (Medical): Yes    Lack of Transportation (Non-Medical): Yes  Physical Activity: Not on file  Stress: Not on file  Social Connections: Not on file  Intimate Partner Violence: Not on file    Review of Systems   General: Negative for anorexia, weight loss, fever, chills, fatigue, weakness. ENT:  Negative for hoarseness, difficulty swallowing , nasal congestion. CV: Negative for chest pain, angina, palpitations, dyspnea on exertion, peripheral edema.  Respiratory: Negative for dyspnea at rest, dyspnea on exertion, cough, sputum, wheezing.  GI: See history of present illness. GU:  Negative for dysuria, hematuria, urinary incontinence, urinary frequency, nocturnal urination.  Endo: Negative for unusual weight change.     Physical Exam   There were no vitals taken for this visit.   General: Well-nourished, well-developed in no acute distress.  Eyes: No icterus. Mouth: Oropharyngeal mucosa moist and pink , no lesions erythema or exudate. Lungs: Clear to auscultation bilaterally.  Heart: Regular rate and rhythm, no murmurs rubs or gallops.  Abdomen: Bowel sounds are normal, nontender, nondistended, no hepatosplenomegaly or masses,  no abdominal bruits or hernia , no rebound or guarding.  Rectal: ***  Extremities: No lower extremity edema. No clubbing or deformities. Neuro: Alert and oriented x 4   Skin: Warm and dry, no jaundice.   Psych: Alert and cooperative, normal mood and affect.  Labs   Lab Results  Component Value Date   NA 141 03/21/2023   CL 104 03/21/2023   K 3.9 03/21/2023   CO2 24 03/21/2023   BUN 10 03/21/2023   CREATININE 0.70 03/21/2023   EGFR 95 03/21/2023   CALCIUM 9.5 03/21/2023   ALBUMIN  4.4 03/21/2023   GLUCOSE 80 03/21/2023   Lab Results  Component Value Date   ALT 22 03/21/2023   AST 25 03/21/2023   GGT 20 05/21/2022   ALKPHOS 120 03/21/2023   BILITOT 0.6 03/21/2023   Lab Results  Component Value Date   LIPASE 30 03/21/2023   Lab Results  Component Value Date   WBC 8.8 03/21/2023   HGB 12.7 03/21/2023   HCT 39.0 03/21/2023   MCV 88 03/21/2023   PLT 381 03/21/2023   Lab Results  Component Value Date   TSH 0.395 (L) 03/21/2023    Imaging Studies   No results found.  Assessment       PLAN   ***   Leanna Battles. Melvyn Neth, MHS, PA-C [facility]

## 2023-03-26 ENCOUNTER — Ambulatory Visit (INDEPENDENT_AMBULATORY_CARE_PROVIDER_SITE_OTHER): Payer: Self-pay

## 2023-03-26 ENCOUNTER — Ambulatory Visit (HOSPITAL_COMMUNITY)
Admission: EM | Admit: 2023-03-26 | Discharge: 2023-03-26 | Disposition: A | Discharge status: Home / Self Care | Payer: No Typology Code available for payment source | Attending: Physician Assistant | Admitting: Physician Assistant

## 2023-03-26 ENCOUNTER — Encounter (HOSPITAL_COMMUNITY): Payer: Self-pay

## 2023-03-26 DIAGNOSIS — R1084 Generalized abdominal pain: Secondary | ICD-10-CM

## 2023-03-26 DIAGNOSIS — K5900 Constipation, unspecified: Secondary | ICD-10-CM

## 2023-03-26 MED ORDER — LACTULOSE 10 GM/15ML PO SOLN: 10.0000 g | Freq: Every day | ORAL | 0 refills | Status: DC | PRN

## 2023-03-26 NOTE — Discharge Instructions (Addendum)
Your x-ray did not show any evidence of an obstruction.  I believe your symptoms are related to constipation.  Start lactulose daily.  Make sure that you are resting and drinking plenty of fluid.  Increase the amount of fiber in your diet.  Follow-up with your gastroenterologist next month as discussed.  If you have any worsening symptoms including severe abdominal pain, blood in your stool, difficulty passing stool, difficulty passing gas, nausea, vomiting you should be seen immediately.  Su radiografa no mostr ninguna evidencia de obstruccin.  Creo que tus sntomas estn relacionados con el estreimiento.  Comience con lactulosa diariamente.  Asegrese de descansar y beber mucho lquido.  Aumente la cantidad de fibra en su dieta.  Haga un seguimiento con su gastroenterlogo el prximo mes segn lo comentado.  Si tiene algn sntoma que empeora, incluido dolor abdominal intenso, sangre en las heces, dificultad para defecar, dificultad para expulsar gases, nuseas o vmitos, debe ser atendido de inmediato.

## 2023-03-26 NOTE — ED Provider Notes (Signed)
[facility]    CSN: [id number] Arrival date & time: 03/26/23  1048      History   Chief Complaint Chief Complaint  Patient presents with   Abdominal Pain    HPI Alexandra Brewer is a 67 y.o. female.   Patient presents today with a several week history of abdominal pain that is worsened in the past few days.  She is Spanish-speaking but interpreter was utilized during visit.  She was seen by her primary care on 03/21/2023 at which point symptoms were attributed to GERD and she was started on Protonix.  This is provided minimal relief of symptoms.  CBC, CMP, lipase was obtained at that visit and were all normal.  She has seen a gastroenterologist this was over 6 months ago.  She has not seen them recently due to financial concerns but has an appointment scheduled 04/20/2022.  She denies any nausea, vomiting, melena, hematochezia.  She does report over the past several days she has not had a bowel movement which is unusual for her.  She typically has 1-2 bowel movements per day.  She is still passing gas.  She reports that the pain is minimal at this time but increases significantly whenever she tries to eat.  She has been drinking normally.  She denies history of diverticulitis, ulcerative colitis, Crohn's disease.    Past Medical History:  Diagnosis Date   Asthma    Constipation    Hyperlipemia    Hypertension    Hypothyroidism    Sciatica    Thyroid disease    Urticaria     Patient Active Problem List   Diagnosis Date Noted   Elevated LFTs 05/18/2022   RUQ pain 05/18/2022   Abdominal pain, epigastric 05/16/2021   Esophageal dysphagia 05/16/2021   Calculus of gallbladder without cholecystitis without obstruction 01/27/2021   Fatty liver 01/27/2021   Class 1 obesity due to excess calories with serious comorbidity and body mass index (BMI) of 31.0 to 31.9 in adult 01/27/2021   Long term (current) use of non-steroidal anti-inflammatories (nsaid) 01/12/2021    Migraine without aura and without status migrainosus, not intractable 05/31/2020   Abdominal pain 03/10/2018   Sciatica of right side 06/11/2017   RUQ abdominal pain 01/23/2017   GERD (gastroesophageal reflux disease) 11/26/2016   Right Achilles tendinitis 08/15/2016   Hypertension 07/05/2016   Adhesive capsulitis of right shoulder 02/21/2016   Heel pain, bilateral 01/10/2016   Varicose veins of left lower extremity with pain 01/10/2016   History of colonic polyps    Diverticulosis of colon without hemorrhage    Hypothyroidism 08/10/2015   Chronic constipation 08/05/2015   Hyperlipidemia 08/05/2015    Past Surgical History:  Procedure Laterality Date   BIOPSY  06/11/2022   Procedure: BIOPSY;  Surgeon: Corbin Ade, MD;  Location: [facility];  Service: Endoscopy;;   BREAST BIOPSY Left 2018   FIBROCYSTIC CHANGES WITH FEATURES OF RUPTURE   CATARACT EXTRACTION     CHOLECYSTECTOMY N/A 05/26/2021   Procedure: LAPAROSCOPIC CHOLECYSTECTOMY;  Surgeon: Franky Macho, MD;  Location: [facility];  Service: General;  Laterality: N/A;   COLONOSCOPY N/A 10/10/2015   one 3mm polyp removed (benign colon mucosa), diverticulosis, internal hemorrhoids, next colonoscopy 09/2025. Procedure: COLONOSCOPY;  Surgeon: Corbin Ade, MD;  Location: [facility];  Service: Endoscopy;  Laterality: N/A;  245 - Interpreter scheduled, do NOT move   ESOPHAGOGASTRODUODENOSCOPY (EGD) WITH PROPOFOL N/A 06/11/2022   Procedure: ESOPHAGOGASTRODUODENOSCOPY (EGD) WITH PROPOFOL;  Surgeon: Jena Gauss,  Gerrit Friends, MD;  Location: [facility];  Service: Endoscopy;  Laterality: N/A;  8:15 am   GALLBLADDER SURGERY  05/26/2021   MALONEY DILATION N/A 06/11/2022   Procedure: Elease Hashimoto DILATION;  Surgeon: Corbin Ade, MD;  Location: [facility];  Service: Endoscopy;  Laterality: N/A;    OB History     Gravida  2   Para  1   Term  1   Preterm      AB  1   Living         SAB  1   IAB      Ectopic      Multiple       Live Births  1            Home Medications    Prior to Admission medications   Medication Sig Start Date End Date Taking? Authorizing Provider  lactulose (CHRONULAC) 10 GM/15ML solution Take 15 mLs (10 g total) by mouth daily as needed for mild constipation. 03/26/23  Yes Lyndia Bury K, PA-C  albuterol (VENTOLIN HFA) 108 (90 Base) MCG/ACT inhaler Inhale 2 puffs into the lungs every 6 (six) hours as needed for wheezing or shortness of breath. 05/02/22   Marcelyn Bruins, MD  amLODipine (NORVASC) 5 MG tablet Take 1 tablet (5 mg total) by mouth daily. 01/31/23   Marcine Matar, MD  dexamethasone (DECADRON) 0.1 % ophthalmic suspension Place 1 drop into both eyes daily.    [provider]  EPINEPHrine 0.3 mg/0.3 mL IJ SOAJ injection Inject 0.3 mg into the muscle as needed for anaphylaxis. 05/02/22   Marcelyn Bruins, MD  Fluticasone Furoate (ARNUITY ELLIPTA) 100 MCG/ACT AEPB Inhale 1 puff into the lungs daily. 05/02/22   Marcelyn Bruins, MD  ipratropium (ATROVENT) 0.06 % nasal spray Place 2 sprays into both nostrils 2 (two) times daily as needed for rhinitis. 09/05/22   Marcelyn Bruins, MD  levocetirizine (XYZAL) 5 MG tablet Take 1 tablet (5 mg total) by mouth in the morning and at bedtime. 09/05/22   Marcelyn Bruins, MD  levothyroxine (SYNTHROID) 137 MCG tablet Take 1 tablet (137 mcg total) by mouth daily. 03/08/23   Marcine Matar, MD  meloxicam (MOBIC) 15 MG tablet Take 1 tablet (15 mg total) by mouth daily. 01/15/22   Marcine Matar, MD  montelukast (SINGULAIR) 10 MG tablet Take 1 tablet (10 mg total) by mouth at bedtime. 09/05/22   Marcelyn Bruins, MD  ondansetron (ZOFRAN) 4 MG tablet Take 1 tablet (4 mg total) by mouth daily as needed for nausea or vomiting. 03/21/23   Marcine Matar, MD  pantoprazole (PROTONIX) 40 MG tablet Take 1 tablet (40 mg total) by mouth 2 (two) times daily(breakfast and supper) for 30 days,  THEN 1 tablet (40 mg total) daily. 03/21/23 05/20/23  Marcine Matar, MD  topiramate (TOPAMAX) 25 MG tablet Take 1 tablet (25 mg total) by mouth 2 (two) times daily. 01/01/23 02/01/23  Claiborne Rigg, NP  Wheat Dextrin (BENEFIBER) POWD Take by mouth daily. Two tablespoons    [provider]    Family History Family History  Problem Relation Age of Onset   Allergic rhinitis Mother    Diabetes Father    Hypertension Father    Asthma Father    Stroke Maternal Grandmother    Stroke Maternal Grandfather    Colon cancer Neg Hx    Angioedema Neg Hx    Atopy Neg Hx  Immunodeficiency Neg Hx    Urticaria Neg Hx    Breast cancer Neg Hx     Social History Social History   Tobacco Use   Smoking status: Never   Smokeless tobacco: Never  Vaping Use   Vaping status: Never Used  Substance Use Topics   Alcohol use: No    Alcohol/week: 0.0 standard drinks of alcohol   Drug use: No     Allergies   Fish allergy, Naproxen, Shellfish allergy, and Penicillins   Review of Systems Review of Systems  Constitutional:  Positive for activity change. Negative for appetite change, fatigue and fever.  Respiratory:  Negative for cough and shortness of breath.   Cardiovascular:  Negative for chest pain.  Gastrointestinal:  Positive for abdominal pain and constipation. Negative for blood in stool, diarrhea, nausea and vomiting.     Physical Exam Triage Vital Signs ED Triage Vitals  Encounter Vitals Group     BP 03/26/23 1132 118/67     Systolic BP Percentile --      Diastolic BP Percentile --      Pulse Rate 03/26/23 1132 68     Resp 03/26/23 1132 18     Temp 03/26/23 1132 98.1 F (36.7 C)     Temp Source 03/26/23 1132 Oral     SpO2 03/26/23 1132 98 %     Weight --      Height --      Head Circumference --      Peak Flow --      Pain Score 03/26/23 1130 5     Pain Loc --      Pain Education --      Exclude from Growth Chart --    No data found.  Updated Vital  Signs BP 118/67 (BP Location: Left Arm)   Pulse 68   Temp 98.1 F (36.7 C) (Oral)   Resp 18   SpO2 98%   Visual Acuity Right Eye Distance:   Left Eye Distance:   Bilateral Distance:    Right Eye Near:   Left Eye Near:    Bilateral Near:     Physical Exam Vitals reviewed.  Constitutional:      General: She is awake. She is not in acute distress.    Appearance: Normal appearance. She is well-developed. She is not ill-appearing.     Comments: Very pleasant female appears stated age in no acute distress sitting comfortably in exam room  HENT:     Head: Normocephalic and atraumatic.     Mouth/Throat:     Mouth: Mucous membranes are moist.     Pharynx: Uvula midline. No oropharyngeal exudate or posterior oropharyngeal erythema.  Cardiovascular:     Rate and Rhythm: Normal rate and regular rhythm.     Heart sounds: Normal heart sounds, S1 normal and S2 normal. No murmur heard. Pulmonary:     Effort: Pulmonary effort is normal.     Breath sounds: Normal breath sounds. No wheezing, rhonchi or rales.     Comments: Clear to auscultation bilaterally Abdominal:     General: Bowel sounds are normal.     Palpations: Abdomen is soft.     Tenderness: There is abdominal tenderness in the epigastric area. There is no right CVA tenderness, left CVA tenderness, guarding or rebound.     Comments: Mild tenderness palpation throughout abdomen.  No evidence of acute abdomen on physical exam.  Psychiatric:        Behavior: Behavior is cooperative.  UC Treatments / Results  Labs (all labs ordered are listed, but only abnormal results are displayed) Labs Reviewed - No data to display  EKG   Radiology DG Abdomen 1 View Result Date: 03/26/2023 CLINICAL DATA:  67 year old female with abdominal pain. Postprandial pain. EXAM: ABDOMEN - 1 VIEW COMPARISON:  Report of CT Abdomen and Pelvis 11/03/2009 (no images available). FINDINGS: AP upright view of the abdomen at 1219 hours.  Cholecystectomy clips. Nonobstructed bowel-gas pattern. No evidence of pneumoperitoneum. Negative abdominal visceral contours. Negative visible lung bases. No acute osseous abnormality identified. IMPRESSION: Nonobstructed bowel-gas pattern.  Previous cholecystectomy. Electronically Signed   By: Odessa Fleming M.D.   On: 03/26/2023 12:32    Procedures Procedures (including critical care time)  Medications Ordered in UC Medications - No data to display  Initial Impression / Assessment and Plan / UC Course  I have reviewed the triage vital signs and the nursing notes.  Pertinent labs & imaging results that were available during my care of the patient were reviewed by me and considered in my medical decision making (see chart for details).     Patient is well-appearing, afebrile, nontoxic, nontachycardic.  Vital signs and physical exam are reassuring with no indication for emergent evaluation or imaging.  Patient has had a negative workup with her primary care including blood work so this was not repeated today.  KUB was obtained that showed nonobstructive bowel gas pattern.  Discussed constipation could be contributing to her symptoms given has been several days since she has had a bowel movement.  Will start lactulose daily and she was encouraged to push fluids and increase her fiber in diet.  Recommended that she rest and drink plenty of fluid.  She has an appointment scheduled with gastroenterology next month will strongly encouraged to keep this appointment.  She will continue Protonix as prescribed by her PCP.  We discussed that if anything worsens or changes and she has severe abdominal pain, fever, nausea, vomiting, melena, hematochezia she needs to be seen immediately.  Strict return precautions given.  All questions answered to patient satisfaction.  Final Clinical Impressions(s) / UC Diagnoses   Final diagnoses:  Generalized abdominal pain  Constipation, unspecified constipation type      Discharge Instructions      Your x-ray did not show any evidence of an obstruction.  I believe your symptoms are related to constipation.  Start lactulose daily.  Make sure that you are resting and drinking plenty of fluid.  Increase the amount of fiber in your diet.  Follow-up with your gastroenterologist next month as discussed.  If you have any worsening symptoms including severe abdominal pain, blood in your stool, difficulty passing stool, difficulty passing gas, nausea, vomiting you should be seen immediately.  Su radiografa no mostr ninguna evidencia de obstruccin.  Creo que tus sntomas estn relacionados con el estreimiento.  Comience con lactulosa diariamente.  Asegrese de descansar y beber mucho lquido.  Aumente la cantidad de fibra en su dieta.  Haga un seguimiento con su gastroenterlogo el prximo mes segn lo comentado.  Si tiene algn sntoma que empeora, incluido dolor abdominal intenso, sangre en las heces, dificultad para defecar, dificultad para expulsar gases, nuseas o vmitos, debe ser atendido de inmediato.     ED Prescriptions     Medication Sig Dispense Auth. Provider   lactulose (CHRONULAC) 10 GM/15ML solution Take 15 mLs (10 g total) by mouth daily as needed for mild constipation. 90 mL Hall Birchard K, PA-C  PDMP not reviewed this encounter.   Jeani Hawking, PA-C 03/26/23 1239

## 2023-03-26 NOTE — ED Triage Notes (Signed)
Translator used in pt's triage.   Pt presents with complaints of abdominal pain x 8 days. Pt was seen by another provider and prescribed Zofran and Protonix with no relief. Pt states she is having mild pain but when she eats the pain increases to a 10/10. Pt states she is scared to eat due to the pain. Pt has been unable to have a BM for two days.

## 2023-03-27 ENCOUNTER — Other Ambulatory Visit: Payer: Self-pay | Admitting: Internal Medicine

## 2023-03-27 DIAGNOSIS — E039 Hypothyroidism, unspecified: Secondary | ICD-10-CM

## 2023-03-27 MED ORDER — LEVOTHYROXINE SODIUM 125 MCG PO TABS
125.0000 ug | ORAL_TABLET | Freq: Every day | ORAL | 2 refills | Status: DC
  Filled 2023-03-27: qty 30, 30d supply, fill #0
  Filled 2023-05-06: qty 30, 30d supply, fill #1
  Filled 2023-06-10: qty 30, 30d supply, fill #2

## 2023-03-28 ENCOUNTER — Other Ambulatory Visit: Payer: Self-pay

## 2023-04-04 ENCOUNTER — Ambulatory Visit: Payer: Self-pay | Admitting: Family Medicine

## 2023-04-04 NOTE — Progress Notes (Deleted)
   [address] [place] [address] Dept: [phone]  FOLLOW UP NOTE  Patient ID: Alexandra Brewer, female    DOB: [birth year]  Age: 69 y.o. MRN: [id number] Date of Office Visit: 04/04/2023  Assessment  Chief Complaint: No chief complaint on file.  HPI Alexandra Brewer is a 68 year old female who presents to the clinic for follow-up visit.  She was last seen in this clinic on 09/05/2022 by Dr. 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  Her last food allergy skin testing was on 03/18/2020 was positive to crab.  Lab testing on 06/05/2021 was slightly positive to alpha gal IgE 0.2.  Thyroid antibodies were elevated at that time in the setting of known hypothyroidism with levothyroxine  for replacement.  Discussed the use of AI scribe software for clinical note transcription with the patient, who gave verbal consent to proceed.  History of Present Illness             Drug Allergies:  Allergies  Allergen Reactions   Fish Allergy     Throat swell up   Naproxen Rash   Shellfish Allergy     Throat swell up   Penicillins Hives    Physical Exam: There were no vitals taken for this visit.   Physical Exam  Diagnostics:    Assessment and Plan: No diagnosis found.  No orders of the defined types were placed in this encounter.   There are no Patient Instructions on file for this visit.  No follow-ups on file.    Thank you for the opportunity to care for this patient.  Please do not hesitate to contact me with questions.  Arlean Mutter, FNP [facility]

## 2023-04-08 ENCOUNTER — Other Ambulatory Visit: Payer: Self-pay

## 2023-04-08 ENCOUNTER — Encounter: Payer: Self-pay | Admitting: Family Medicine

## 2023-04-08 ENCOUNTER — Ambulatory Visit: Payer: Self-pay | Admitting: Family Medicine

## 2023-04-08 VITALS — BP 122/82 | HR 64 | Temp 98.3°F | Resp 18 | Ht 60.75 in | Wt 145.3 lb

## 2023-04-08 DIAGNOSIS — T7800XD Anaphylactic reaction due to unspecified food, subsequent encounter: Secondary | ICD-10-CM

## 2023-04-08 DIAGNOSIS — J3089 Other allergic rhinitis: Secondary | ICD-10-CM

## 2023-04-08 DIAGNOSIS — L508 Other urticaria: Secondary | ICD-10-CM

## 2023-04-08 DIAGNOSIS — H1013 Acute atopic conjunctivitis, bilateral: Secondary | ICD-10-CM | POA: Insufficient documentation

## 2023-04-08 DIAGNOSIS — J302 Other seasonal allergic rhinitis: Secondary | ICD-10-CM

## 2023-04-08 DIAGNOSIS — J4530 Mild persistent asthma, uncomplicated: Secondary | ICD-10-CM

## 2023-04-08 MED ORDER — MONTELUKAST SODIUM 10 MG PO TABS
10.0000 mg | ORAL_TABLET | Freq: Every day | ORAL | 5 refills | Status: DC
  Filled 2023-04-08: qty 30, 30d supply, fill #0

## 2023-04-08 MED ORDER — IPRATROPIUM BROMIDE 0.06 % NA SOLN
2.0000 | Freq: Two times a day (BID) | NASAL | 5 refills | Status: DC | PRN
  Filled 2023-04-08: qty 15, 25d supply, fill #0

## 2023-04-08 MED ORDER — ALBUTEROL SULFATE HFA 108 (90 BASE) MCG/ACT IN AERS
2.0000 | INHALATION_SPRAY | Freq: Four times a day (QID) | RESPIRATORY_TRACT | 1 refills | Status: DC | PRN
  Filled 2023-04-08: qty 6.7, 25d supply, fill #0

## 2023-04-08 MED ORDER — ARNUITY ELLIPTA 100 MCG/ACT IN AEPB
1.0000 | INHALATION_SPRAY | Freq: Every day | RESPIRATORY_TRACT | 5 refills | Status: DC
  Filled 2023-04-08: qty 30, fill #0

## 2023-04-08 NOTE — Progress Notes (Signed)
 [address] [place] [address] Dept: [phone]  FOLLOW UP NOTE  Patient ID: Alexandra Brewer, female    DOB: 30-Jul-1955  Age: 68 y.o. MRN: [id number] Date of Office Visit: 04/08/2023  Assessment  Chief Complaint: Asthma and Follow-up  HPI Alexandra Brewer is a 68 year old female who presents to the clinic for a follow-up visit.  She was last seen in this clinic on 09/05/2022 by Dr. 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  An interpreter is available throughout the visit.    At today's visit, she reports her asthma has been well controlled with no symptoms including shortness of breath, cough or wheeze with activity or rest. She continues montelukast  10 mg daily and rarely uses Arunity 100. She reports that she has not used albuterol  since her last visit to this clinic.  Allergic rhinitis is reported as moderately well controlled with nasal congestion and clear rhinorrhea as the main symptoms. She continues levocetirizine daily and occasionally uses azelastine  nasal spray. She is not currently using a nasal saline rinse.  Her last environmental allergy skin testing was on 03/18/2020 was positive to pollens and mold.  Allergic conjunctivitis is reported as moderately well controlled with occasional red and itchy eyes for which she uses olopatadine  with relief of symptoms.   She denies any incidences of urticaria and continues levocetirizine 5 mg once a day.   She continues to avoid mammalian meats, fish, and shellfish. Her last food allergy skin testing was on 03/18/2020 was positive to crab.  Lab testing on 06/05/2021 was slightly positive to alpha gal IgE 0.2.  Epinephrine  autoinjector set is up-to-date.  His current medications are listed in the chart.  Drug Allergies:  Allergies  Allergen Reactions   Fish Allergy     Throat swell up   Naproxen Rash   Shellfish Allergy      Throat swell up   Penicillins Hives    Physical Exam: BP 122/82 (BP Location: Right Arm, Patient Position: Sitting, Cuff Size: Normal)   Pulse 64   Temp 98.3 F (36.8 C) (Temporal)   Resp 18   Ht 5' 0.75 (1.543 m)   Wt 145 lb 4.8 oz (65.9 kg)   SpO2 99%   BMI 27.68 kg/m    Physical Exam Vitals reviewed.  Constitutional:      Appearance: Normal appearance.  HENT:     Head: Normocephalic and atraumatic.     Right Ear: Tympanic membrane normal.     Left Ear: Tympanic membrane normal.     Nose:     Comments: Bilateral nares slightly erythematous with thin clear nasal saline rinses. Pharynx normal. Ears normal. Eyes normal.    Mouth/Throat:     Pharynx: Oropharynx is clear.  Eyes:     Conjunctiva/sclera: Conjunctivae normal.  Cardiovascular:     Rate and Rhythm: Normal rate and regular rhythm.     Heart sounds: Normal heart sounds. No murmur heard. Pulmonary:     Effort: Pulmonary effort is normal.     Breath sounds: Normal breath sounds.     Comments: Lungs clear to auscultation Musculoskeletal:        General: Normal range of motion.     Cervical back: Normal range of motion and neck supple.  Skin:    General: Skin is warm and dry.  Neurological:     Mental Status: She is alert and oriented to person, place, and time.  Psychiatric:        Mood and Affect: Mood normal.        Behavior: Behavior normal.        Thought Content: Thought content normal.        Judgment: Judgment normal.     Diagnostics: FVC 1.90 which is 69% of predicted value, FEV1 1.36 which is 62% of predicted value.  Spirometry indicates possible restriction.  This is consistent with previous spirometry readings.  Assessment and Plan: 1. Mild persistent asthma without complication   2. Seasonal and perennial allergic rhinitis   3. Allergic conjunctivitis of both eyes   4. Chronic urticaria   5. Allergy with anaphylaxis due to food, subsequent encounter     Meds ordered this encounter   Medications   albuterol  (VENTOLIN  HFA) 108 (90 Base) MCG/ACT inhaler    Sig: Inhale 2 puffs into the lungs every 6 (six) hours as needed for wheezing or shortness of breath.    Dispense:  6.7 g    Refill:  1   Fluticasone  Furoate (ARNUITY ELLIPTA ) 100 MCG/ACT AEPB    Sig: Inhale 1 puff into the lungs daily.    Dispense:  30 each    Refill:  5   ipratropium (ATROVENT ) 0.06 % nasal spray    Sig: Place 2 sprays into both nostrils 2 (two) times daily as needed for rhinitis.    Dispense:  15 mL    Refill:  5    Spanish instructions   montelukast  (SINGULAIR ) 10 MG tablet    Sig: Take 1 tablet (10 mg total) by mouth at bedtime.    Dispense:  30 tablet    Refill:  5    Spanish instructions    Patient Instructions  Allergic rhinitis with conjunctivitis (tree pollen and mold mix 2) -Continue Zyrtec  (cetirizine ) once a day as needed for runny nose/itching.  -Continue Singulair  10 mg at night for allergy control -Continue Atrovent  2 sprays each nostril twice a day (may use up to 3 times a day if needed for nasal drainage)  Allergic conjunctivitis -Continue olopatadine  0.2% eyedrops using 1 drop each eye once a day as needed for itchy watery eyes  Mild persistent asthma -Continue Singulair  10 mg as above to help prevent cough and wheeze -May use Ventolin  (albuterol ) 2 puffs every 4 hours as needed for cough, wheeze, tightness in chest, shortness of breath.  Also, may use albuterol  2 puffs 5 to 15 minutes prior to exercise. - For asthma flare, begin Arnutiy 100-1 puff once a day for 1-2 weeks, then stop  Asthma control goals:  Full participation in all desired activities (may need albuterol  before activity) Albuterol  use two time or less a week on average (not counting use with activity) Cough interfering with sleep two time or less a month Oral steroids no more than once a year No hospitalizations  Anaphylaxis due to food -Continue avoid  red meat (mammalian meat), fish and shellfish.  In case of an allergic reaction, give Benadryl 4 teaspoonfuls every 4 hours, and if life-threatening symptoms occur, inject with EpiPen  0.3 mg. Consider updating your food allergy testing.  Remember to stop antihistamines for 3 days before your testing appointment  Urticaria (hives) - Continue cetirizine  once a day as needed for hives. You may take an additional dose of cetirizine  if needed for hives.   Call the clinic if this treatment plan is not working well for you  Follow up in 6 months or sooner if needed.   Return  in about 6 months (around 10/06/2023), or if symptoms worsen or fail to improve.    Thank you for the opportunity to care for this patient.  Please do not hesitate to contact me with questions.  Arlean Mutter, FNP [facility] 

## 2023-04-08 NOTE — Patient Instructions (Addendum)
 Allergic rhinitis with conjunctivitis (tree pollen and mold mix 2) -Continue Zyrtec  (cetirizine ) once a day as needed for runny nose/itching.  -Continue Singulair  10 mg at night for allergy control -Continue Atrovent  2 sprays each nostril twice a day (may use up to 3 times a day if needed for nasal drainage)  Allergic conjunctivitis -Continue olopatadine  0.2% eyedrops using 1 drop each eye once a day as needed for itchy watery eyes  Mild persistent asthma -Continue Singulair  10 mg as above to help prevent cough and wheeze -May use Ventolin  (albuterol ) 2 puffs every 4 hours as needed for cough, wheeze, tightness in chest, shortness of breath.  Also, may use albuterol  2 puffs 5 to 15 minutes prior to exercise. - For asthma flare, begin Arnutiy 100-1 puff once a day for 1-2 weeks, then stop  Asthma control goals:  Full participation in all desired activities (may need albuterol  before activity) Albuterol  use two time or less a week on average (not counting use with activity) Cough interfering with sleep two time or less a month Oral steroids no more than once a year No hospitalizations  Anaphylaxis due to food -Continue avoid  red meat (mammalian meat), fish and shellfish. In case of an allergic reaction, give Benadryl 4 teaspoonfuls every 4 hours, and if life-threatening symptoms occur, inject with EpiPen  0.3 mg. Consider updating your food allergy testing.  Remember to stop antihistamines for 3 days before your testing appointment  Urticaria (hives) - Continue cetirizine  once a day as needed for hives. You may take an additional dose of cetirizine  if needed for hives.   Call the clinic if this treatment plan is not working well for you  Follow up in 6 months or sooner if needed.

## 2023-04-10 ENCOUNTER — Ambulatory Visit: Payer: No Typology Code available for payment source | Admitting: Gastroenterology

## 2023-04-17 NOTE — Progress Notes (Signed)
Referring Provider: Marcine Matar, MD Primary Care Physician:  Marcine Matar, MD Primary GI Physician: Dr. Jena Gauss  No chief complaint on file.   HPI:   Alexandra Brewer is a 68 y.o. female with history of chronic constipation, GERD, elevated alk phos, presenting today for follow-up with chief complaint of upper abdominal burning, nausea, and constipation.   Last in the office 05/18/2022.  Reported she continued to have stomach problems.  She is taking pantoprazole 40 mg a day and denied heartburn though she did have a lot of belching.  Also with early satiety, bloating, felt like her food was not digesting.  Also with dysphagia.  Also complained of RUQ pain when not able to have a good bowel movement.  Used to be on Linzess but no longer able to get it.  She is taking Benefiber 2 tablespoons daily and having a bowel movement every other day, but not productive.  No BRBPR, melena.  Recommended updating labs, EGD, continue pantoprazole, and would find out what was available for constipation with her coverage.  Later found out that patient had to pay out-of-pocket for her medications.  Recommended MiraLAX or trying to get patient assistance for Linzess.  Patient opted to do MiraLAX twice a day.  Last completed 05/21/2022.  CBC within normal limits.  Alk phos slightly elevated at 124 with AST, ALT, T. bili within normal limits.  GGT and AMA also normal making it unlikely that mildly elevated alk phos was secondary to liver source.  Ultimately, felt this was insignificant.  EGD 06/11/2022 subjectively, mildly narrowed tubular esophagus with ringed appearance s/p dilation followed by biopsy to rule out EOE, normal stomach and duodenum.  Pathology with mild vascular congestion and focal squamous ballooning suggesting reflux esophagitis, no increased intraepithelial eosinophils.   Today: Reports having having epigastric burning and sensation of needing to burp with occasional nausea  postprandially regardless of what she eats since early December.  States her symptoms feel like heartburn.  She is currently taking pantoprazole 40 mg twice a day.  Pantoprazole was increased from once a day to twice a day on 12/24 after she went to urgent care.  Reports she is avoiding spicy foods.  Symptoms will definitely worsen with greasy foods.  She does have some early satiety.   Overall, feels her symptoms are different than they were last year when she had her upper endoscopy.  Prior dysphagia has resolved.  NSAIDs:  No   No brbpr or melena .   Also had a lot of trouble with constipation.  She was prescribed Constella's after she presented to the ER on 12/24, but this is not very helpful.  The only thing that has been somewhat helpful is MiraLAX.  She is currently taking 1.5 tablespoons daily and having small, incomplete bowel movements every couple of days.   Reviewed most recent labs 03/21/2023.  CBC, CMP, lipase within normal limits.  TSH low at 0.395.  Colonoscopy 10/2015: - One 3 mm polyp in the cecum, removed with a cold snare. Resected and retrieved. - Diverticulosis in the entire examined colon. - Internal hemorrhoids. -benign colonic mucosa -next colonoscopy in 09/2025  Past Medical History:  Diagnosis Date   Asthma    Constipation    Hyperlipemia    Hypertension    Hypothyroidism    Sciatica    Thyroid disease    Urticaria     Past Surgical History:  Procedure Laterality Date   BIOPSY  06/11/2022  Procedure: BIOPSY;  Surgeon: Corbin Ade, MD;  [facility];  Service: Endoscopy;;   BREAST BIOPSY Left 2018   FIBROCYSTIC CHANGES WITH FEATURES OF RUPTURE   CATARACT EXTRACTION     CHOLECYSTECTOMY N/A 05/26/2021   Procedure: LAPAROSCOPIC CHOLECYSTECTOMY;  Surgeon: Franky Macho, MD;  Location: [facility];  Service: General;  Laterality: N/A;   COLONOSCOPY N/A 10/10/2015   one 3mm polyp removed (benign colon mucosa), diverticulosis, internal hemorrhoids,  next colonoscopy 09/2025. Procedure: COLONOSCOPY;  Surgeon: Corbin Ade, MD;  Location: [facility];  Service: Endoscopy;  Laterality: N/A;  245 - Interpreter scheduled, do NOT move   ESOPHAGOGASTRODUODENOSCOPY (EGD) WITH PROPOFOL N/A 06/11/2022   Procedure: ESOPHAGOGASTRODUODENOSCOPY (EGD) WITH PROPOFOL;  Surgeon: Corbin Ade, MD;  [facility];  Service: Endoscopy;  Laterality: N/A;  8:15 am   GALLBLADDER SURGERY  05/26/2021   MALONEY DILATION N/A 06/11/2022   Procedure: Elease Hashimoto DILATION;  Surgeon: Corbin Ade, MD;  Location: [facility];  Service: Endoscopy;  Laterality: N/A;    Current Outpatient Medications  Medication Sig Dispense Refill   albuterol (VENTOLIN HFA) 108 (90 Base) MCG/ACT inhaler Inhale 2 puffs into the lungs every 6 (six) hours as needed for wheezing or shortness of breath. 6.7 g 1   amLODipine (NORVASC) 5 MG tablet Take 1 tablet (5 mg total) by mouth daily. 90 tablet 2   EPINEPHrine 0.3 mg/0.3 mL IJ SOAJ injection Inject 0.3 mg into the muscle as needed for anaphylaxis. 2 each 1   esomeprazole (NEXIUM) 40 MG capsule Take 1 capsule (40 mg total) by mouth 2 (two) times daily before a meal. Tomar 1 cpsula (40 mg en total) por va oral 2 (dos) veces al da antes de una comida. 60 capsule 3   Fluticasone Furoate (ARNUITY ELLIPTA) 100 MCG/ACT AEPB Inhale 1 puff into the lungs daily. 30 each 5   ipratropium (ATROVENT) 0.06 % nasal spray Place 2 sprays into both nostrils 2 (two) times daily as needed for rhinitis. 15 mL 5   levothyroxine (SYNTHROID) 125 MCG tablet Take 1 tablet (125 mcg total) by mouth daily. 30 tablet 2   montelukast (SINGULAIR) 10 MG tablet Take 1 tablet (10 mg total) by mouth at bedtime. 30 tablet 5   levocetirizine (XYZAL) 5 MG tablet Take 1 tablet (5 mg total) by mouth in the morning and at bedtime. 60 tablet 5   loratadine (CLARITIN) 10 MG tablet Take 10 mg by mouth daily.     topiramate (TOPAMAX) 25 MG tablet Take 1 tablet (25 mg  total) by mouth 2 (two) times daily. 60 tablet 0   No current facility-administered medications for this visit.    Allergies as of 04/18/2023 - Review Complete 04/18/2023  Allergen Reaction Noted   Fish allergy  06/11/2017   Naproxen Rash 06/29/2019   Shellfish allergy  06/11/2017   Penicillins Hives 07/20/2015    Family History  Problem Relation Age of Onset   Allergic rhinitis Mother    Diabetes Father    Hypertension Father    Asthma Father    Stroke Maternal Grandmother    Stroke Maternal Grandfather    Colon cancer Neg Hx    Angioedema Neg Hx    Atopy Neg Hx    Immunodeficiency Neg Hx    Urticaria Neg Hx    Breast cancer Neg Hx     Social History   Socioeconomic History   Marital status: Single    Spouse name: Not on file  Number of children: 1   Years of education: Not on file   Highest education level: 3rd grade  Occupational History   Not on file  Tobacco Use   Smoking status: Never   Smokeless tobacco: Never  Vaping Use   Vaping status: Never Used  Substance and Sexual Activity   Alcohol use: No    Alcohol/week: 0.0 standard drinks of alcohol   Drug use: No   Sexual activity: Yes    Birth control/protection: Post-menopausal  Other Topics Concern   Not on file  Social History Narrative   Not on file   Social Drivers of Health   Financial Resource Strain: Not on file  Food Insecurity: No Food Insecurity (02/08/2022)   Hunger Vital Sign    Worried About Running Out of Food in the Last Year: Never true    Ran Out of Food in the Last Year: Never true  Transportation Needs: Unmet Transportation Needs (02/08/2022)   PRAPARE - Administrator, Civil Service (Medical): Yes    Lack of Transportation (Non-Medical): Yes  Physical Activity: Not on file  Stress: Not on file  Social Connections: Not on file    Review of Systems: Gen: Denies fever, chills, cold or like symptoms, presyncope, syncope. CV: Denies chest pain, palpitations. Resp:  Denies dyspnea, cough. GI: See HPI Heme: See HPI  Physical Exam: BP 119/74 (BP Location: Right Arm, Patient Position: Sitting, Cuff Size: Normal)   Pulse 65   Temp 98.2 F (36.8 C) (Oral)   Ht 5' (1.524 m)   Wt 145 lb (65.8 kg)   SpO2 99%   BMI 28.32 kg/m  General:   Alert and oriented. No distress noted. Pleasant and cooperative.  Head:  Normocephalic and atraumatic. Eyes:  Conjuctiva clear without scleral icterus. Abdomen:  +BS, soft, and non-distended.  Mild TTP in epigastric area.  Minimal TTP in RLQ and LLQ.  No rebound or guarding. No HSM or masses noted. Msk:  Symmetrical without gross deformities. Normal posture. Extremities:  Without edema. Neurologic:  Alert and  oriented x4 Psych:  Normal mood and affect.    Assessment:  68 y.o. female with history of chronic constipation, GERD, minimal alk phos elevation with normal GGT and AMA, presenting today with chief complaint of epigastric burning and constipation.  Epigastric burning: New onset early December 2024.  Associated sensation of needing to burp, occasional nausea, and early satiety.  Previously on pantoprazole 40 mg daily but increase to twice daily on 12/24 with no improvement in symptoms.  Last EGD 06/11/2022 with mildly narrowed tubular esophagus with ringed appearance s/p dilation and biopsy (suggestive of reflux esophagitis), normal stomach and duodenum.  Overall, patient feels her symptoms are different now than they were when she had her last upper endoscopy.  Prior dysphagia resolved.  Denies routine NSAIDs.  Recent labs 12/19 with CBC, CMP, lipase within normal limits.  Prior cholecystectomy.  Etiology could be secondary to GERD, loss of response to pantoprazole.  We will try changing pantoprazole to Nexium 40 mg twice daily.  May need to consider gastric emptying study in light of early satiety which was reported prior to her last upper endoscopy.   Constipation: Chronic.  Not adequately managed.  Lactulose 15  mL not particularly helpful.  Notes some improvement with MiraLAX, taking 1.5 tablespoons daily.  Linzess is cost prohibitive as she pays out-of-pocket for her medications.  I will have her increase MiraLAX to 1 full capful twice a day until bowels are  moving well, then can try decreasing to once a day.  If this is not helpful, will pursue patient assistance for Linzess.   Plan:  Stop pantoprazole. Start Nexium 40 mg twice daily before meals. Follow a GERD diet:  Avoid fried, fatty, greasy, spicy, citrus foods. Avoid caffeine and carbonated beverages. Avoid chocolate. Try eating 4-6 small meals a day rather than 3 large meals. Do not eat within 3 hours of laying down. Prop head of bed up on wood or bricks to create a 6 inch incline. Start MiraLAX 1 capful twice daily until bowels are moving well, then decrease to once daily.  May continue with twice daily if needed. Follow-up in 6 weeks or sooner if needed.   Ermalinda Memos, PA-C [facility] 04/18/2023

## 2023-04-18 ENCOUNTER — Ambulatory Visit (INDEPENDENT_AMBULATORY_CARE_PROVIDER_SITE_OTHER): Payer: Self-pay | Admitting: Gastroenterology

## 2023-04-18 ENCOUNTER — Other Ambulatory Visit: Payer: Self-pay

## 2023-04-18 ENCOUNTER — Encounter: Payer: Self-pay | Admitting: Gastroenterology

## 2023-04-18 VITALS — BP 119/74 | HR 65 | Temp 98.2°F | Ht 60.0 in | Wt 145.0 lb

## 2023-04-18 DIAGNOSIS — R6881 Early satiety: Secondary | ICD-10-CM

## 2023-04-18 DIAGNOSIS — R1013 Epigastric pain: Secondary | ICD-10-CM

## 2023-04-18 DIAGNOSIS — R110 Nausea: Secondary | ICD-10-CM

## 2023-04-18 DIAGNOSIS — K5909 Other constipation: Secondary | ICD-10-CM

## 2023-04-18 DIAGNOSIS — K219 Gastro-esophageal reflux disease without esophagitis: Secondary | ICD-10-CM

## 2023-04-18 DIAGNOSIS — K5900 Constipation, unspecified: Secondary | ICD-10-CM

## 2023-04-18 MED ORDER — ESOMEPRAZOLE MAGNESIUM 40 MG PO CPDR
40.0000 mg | DELAYED_RELEASE_CAPSULE | Freq: Two times a day (BID) | ORAL | 3 refills | Status: AC
  Filled 2023-04-18: qty 60, 30d supply, fill #0
  Filled 2023-06-10: qty 60, 30d supply, fill #1
  Filled 2023-07-17 (×3): qty 60, 30d supply, fill #2

## 2023-04-18 NOTE — Patient Instructions (Addendum)
Spanish:  Para ardor / nuseas en la parte superior del abdomen Suspenda el pantoprazol y comience a tomar Nexium 40 mg dos veces al da, 30 minutos antes del desayuno y Physicist, medical.  Por favor, avseme si hay algn problema para obtener este medicamento debido al costo.  Siga una dieta para la enfermedad por reflujo gastroesofgico:  Evite los alimentos fritos, grasos, grasosos, picantes y ctricos. Evite la cafena y las bebidas carbonatadas. Evita el chocolate. Trate de comer de 4 a 6 comidas pequeas al Geophysical data processor de 3 comidas grandes. No coma dentro de las 3 horas posteriores a Teacher, music. Apoye la cabecera de la cama sobre [place] o ladrillos para crear una inclinacin de 6 pulgadas.  Para el estreimiento: Comience a tomar MiraLAX 1 capful dos veces al da en 8 onzas de agua u otra bebida no carbonatada de su eleccin.  Despus de que sus intestinos se [employer], puede disminuir MiraLAX a Medical sales representative.  Si los intestinos no continan movindose bien, vuelva a tomar [employer].  Planear volver a la oficina en 6 semanas o antes si es necesario.  Ermalinda Memos, PA-C [facility]   English: For upper abdominal burning/nausea Stop pantoprazole and start Nexium 40 mg twice daily 30 minutes before breakfast and dinner.  Please let me know if there are any problems obtaining this medication due to cost.  Follow a GERD diet:  Avoid fried, fatty, greasy, spicy, citrus foods. Avoid caffeine and carbonated beverages. Avoid chocolate. Try eating 4-6 small meals a day rather than 3 large meals. Do not eat within 3 hours of laying down. Prop head of bed up on wood or bricks to create a 6 inch incline.   For constipation: Start taking MiraLAX 1 capful twice daily in 8 ounces of water or other noncarbonated beverage of your choice.  After your bowels are moving well, you can decrease MiraLAX to once a day.  If bowels do not continue to move well, resume taking  MiraLAX twice a day.   I will plan to see back in the office in 6 weeks or sooner if needed.  Ermalinda Memos, PA-C [facility]astroenter[facility]

## 2023-04-23 ENCOUNTER — Ambulatory Visit: Payer: Self-pay | Admitting: *Deleted

## 2023-04-23 ENCOUNTER — Ambulatory Visit
Admission: RE | Admit: 2023-04-23 | Discharge: 2023-04-23 | Disposition: A | Discharge status: Home / Self Care | Payer: No Typology Code available for payment source | Source: Ambulatory Visit | Attending: Obstetrics and Gynecology | Admitting: Obstetrics and Gynecology

## 2023-04-23 VITALS — BP 139/71 | Wt 144.0 lb

## 2023-04-23 DIAGNOSIS — Z1231 Encounter for screening mammogram for malignant neoplasm of breast: Secondary | ICD-10-CM

## 2023-04-23 DIAGNOSIS — Z1239 Encounter for other screening for malignant neoplasm of breast: Secondary | ICD-10-CM

## 2023-04-23 NOTE — Progress Notes (Signed)
Ms. Davalyn Manske is a 68 y.o. female who presents to [facility] clinic today with no complaints.    Pap Smear: Pap smear not completed today. Last Pap smear was 03/06/2019 at [facility] and was normal with negative HPV. Per patient has no history of an abnormal Pap smear. Last Pap smear result is available in Epic.   Physical exam: Breasts Breasts symmetrical. No skin abnormalities bilateral breasts. No nipple retraction bilateral breasts. No nipple discharge bilateral breasts. No lymphadenopathy. No lumps palpated bilateral breasts. No complaints of pain or tenderness on exam.  MS DIGITAL SCREENING TOMO BILATERAL Result Date: 02/12/2022 CLINICAL DATA:  Screening. EXAM: DIGITAL SCREENING BILATERAL MAMMOGRAM WITH TOMOSYNTHESIS AND CAD TECHNIQUE: Bilateral screening digital craniocaudal and mediolateral oblique mammograms were obtained. Bilateral screening digital breast tomosynthesis was performed. The images were evaluated with computer-aided detection. COMPARISON:  Previous exam(s). ACR Breast Density Category b: There are scattered areas of fibroglandular density. FINDINGS: There are no findings suspicious for malignancy. IMPRESSION: No mammographic evidence of malignancy. A result letter of this screening mammogram will be mailed directly to the patient. RECOMMENDATION: Screening mammogram in one year. (Code:SM-B-01Y) BI-RADS CATEGORY  1: Negative. Electronically Signed   By: Baird Lyons M.D.   On: 02/12/2022 13:00   MM 3D SCREEN BREAST BILATERAL Result Date: 02/01/2021 CLINICAL DATA:  Screening. EXAM: DIGITAL SCREENING BILATERAL MAMMOGRAM WITH TOMOSYNTHESIS AND CAD TECHNIQUE: Bilateral screening digital craniocaudal and mediolateral oblique mammograms were obtained. Bilateral screening digital breast tomosynthesis was performed. The images were evaluated with computer-aided detection. COMPARISON:  Previous exam(s). ACR Breast Density Category b: There are  scattered areas of fibroglandular density. FINDINGS: There are no findings suspicious for malignancy. IMPRESSION: No mammographic evidence of malignancy. A result letter of this screening mammogram will be mailed directly to the patient. RECOMMENDATION: Screening mammogram in one year. (Code:SM-B-01Y) BI-RADS CATEGORY  1: Negative. Electronically Signed   By: Baird Lyons M.D.   On: 02/01/2021 11:33   MS DIGITAL SCREENING TOMO BILATERAL Result Date: 05/04/2019 CLINICAL DATA:  Screening. EXAM: DIGITAL SCREENING BILATERAL MAMMOGRAM WITH TOMO AND CAD COMPARISON:  Previous exam(s). ACR Breast Density Category b: There are scattered areas of fibroglandular density. FINDINGS: There are no findings suspicious for malignancy. Images were processed with CAD. IMPRESSION: No mammographic evidence of malignancy. A result letter of this screening mammogram will be mailed directly to the patient. RECOMMENDATION: Screening mammogram in one year. (Code:SM-B-01Y) BI-RADS CATEGORY  1: Negative. Electronically Signed   By: Beckie Salts M.D.   On: 05/04/2019 17:15    Pelvic/Bimanual Pap is not indicated today per BCCCP guidelines.   Smoking History: Patient has never smoked.   Patient Navigation: Patient education provided. Access to services provided for patient through [place] program. Spanish interpreter Natale Lay from [facility] provided. Transportation provided by taxi home from appointment. Patient has food insecurities. Patient escorted to the market at the [employer] for groceries.   Colorectal Cancer Screening: Per patient has had colonoscopy completed on 10/10/2015 at [facility]. Patient scheduled to see GI 04/29/2023. Patient completed a FIT test 08/30/2021 and negative. No complaints today.   Breast and Cervical Cancer Risk Assessment: Patient does not have family history of breast cancer, known genetic mutations, or radiation treatment to the chest before age 49. Patient does not have history of cervical  dysplasia, immunocompromised, or DES exposure in-utero.  Risk Scores as of Encounter on 04/23/2023     Dondra Spry  5-year 1.62%   Lifetime 6.6%            Last calculated by Caprice Red, CMA on 04/23/2023 at  9:03 AM        A: BCCCP exam without pap smear No complaints.   P: Referred patient to the [facility] for a screening mammogram on mobile unit. Appointment scheduled Tuesday, April 23, 2023 at 1000.  Priscille Heidelberg, RN 04/23/2023 9:31 AM

## 2023-04-23 NOTE — Patient Instructions (Signed)
Explained breast self awareness with Shanon Ace. Patient did not need a Pap smear today due to last Pap smear and HPV typing was 03/06/2019. Let her know BCCCP will cover Pap smears and HPV typing every 5 years unless has a history of abnormal Pap smears. Referred patient to the [facility] for a screening mammogram on mobile unit. Appointment scheduled Tuesday, April 23, 2023 at 1000. Patient aware of appointment and will be there. Let patient know the Breast Center will follow up with her within the next couple weeks with results of mammogram by letter or phone. Alexandra Brewer verbalized understanding.  Noa Galvao, Kathaleen Maser, RN 9:31 AM

## 2023-04-29 ENCOUNTER — Other Ambulatory Visit: Payer: Self-pay

## 2023-05-06 ENCOUNTER — Other Ambulatory Visit: Payer: Self-pay

## 2023-05-29 NOTE — Progress Notes (Deleted)
 Referring Provider: Marcine Matar, MD Primary Care Physician:  Marcine Matar, MD Primary GI Physician: Dr. Bonnetta Barry chief complaint on file.   HPI:   Alexandra Brewer is a 68 y.o. female with history of chronic constipation, GERD, cholecystectomy, minimally elevated alk phos with normal AMA and GGT, presenting today for 6 week follow-up of upper abdominal burning, nausea, and constipation.   At her last visit 04/18/23, she reported new onset epigastric burning early December 2024. Associated sensation of needing to burp, occasional nausea, and early satiety. Previously on pantoprazole 40 mg daily but increase to twice daily on 12/24 with no improvement in symptoms. She had labs in December that were all normal.  Also with chronic constipation not adequately managed with lactulose and MiraLAX. Recommendations included change pantoprazole to Nexium 40 mg BID, GERD diet, Miralax 17 g BID until bowels moving well, then once daily.  May need to consider GES as early satiety was actually reported prior to last EGD.    Today:          EGD 06/11/2022 subjectively, mildly narrowed tubular esophagus with ringed appearance s/p dilation followed by biopsy to rule out EOE, normal stomach and duodenum.  Pathology with mild vascular congestion and focal squamous ballooning suggesting reflux esophagitis, no increased intraepithelial eosinophils.     Past Medical History:  Diagnosis Date   Asthma    Constipation    Hyperlipemia    Hypertension    Hypothyroidism    Sciatica    Thyroid disease    Urticaria     Past Surgical History:  Procedure Laterality Date   BIOPSY  06/11/2022   Procedure: BIOPSY;  Surgeon: Corbin Ade, MD;  [facility];  Service: Endoscopy;;   BREAST BIOPSY Left 2018   FIBROCYSTIC CHANGES WITH FEATURES OF RUPTURE   CATARACT EXTRACTION     CHOLECYSTECTOMY N/A 05/26/2021   Procedure: LAPAROSCOPIC CHOLECYSTECTOMY;  Surgeon: Franky Macho, MD;   Location: [facility];  Service: General;  Laterality: N/A;   COLONOSCOPY N/A 10/10/2015   one 3mm polyp removed (benign colon mucosa), diverticulosis, internal hemorrhoids, next colonoscopy 09/2025. Procedure: COLONOSCOPY;  Surgeon: Corbin Ade, MD;  [facility]: [facility];  Service: Endoscopy;  Laterality: N/A;  245 - Interpreter scheduled, do NOT move   ESOPHAGOGASTRODUODENOSCOPY (EGD) WITH PROPOFOL N/A 06/11/2022   Procedure: ESOPHAGOGASTRODUODENOSCOPY (EGD) WITH PROPOFOL;  Surgeon: Corbin Ade, MD;  [facility];  Service: Endoscopy;  Laterality: N/A;  8:15 am   GALLBLADDER SURGERY  05/26/2021   MALONEY DILATION N/A 06/11/2022   Procedure: Elease Hashimoto DILATION;  Surgeon: Corbin Ade, MD;  [facility]: [facility];  Service: Endoscopy;  Laterality: N/A;    Current Outpatient Medications  Medication Sig Dispense Refill   albuterol (VENTOLIN HFA) 108 (90 Base) MCG/ACT inhaler Inhale 2 puffs into the lungs every 6 (six) hours as needed for wheezing or shortness of breath. 6.7 g 1   amLODipine (NORVASC) 5 MG tablet Take 1 tablet (5 mg total) by mouth daily. 90 tablet 2   EPINEPHrine 0.3 mg/0.3 mL IJ SOAJ injection Inject 0.3 mg into the muscle as needed for anaphylaxis. 2 each 1   esomeprazole (NEXIUM) 40 MG capsule Take 1 capsule (40 mg total) by mouth 2 (two) times daily before a meal. Tomar 1 cpsula (40 mg en total) por va oral 2 (dos) veces al da antes de una comida. 60 capsule 3   Fluticasone Furoate (ARNUITY ELLIPTA) 100 MCG/ACT AEPB  Inhale 1 puff into the lungs daily. 30 each 5   ipratropium (ATROVENT) 0.06 % nasal spray Place 2 sprays into both nostrils 2 (two) times daily as needed for rhinitis. 15 mL 5   levocetirizine (XYZAL) 5 MG tablet Take 1 tablet (5 mg total) by mouth in the morning and at bedtime. 60 tablet 5   levothyroxine (SYNTHROID) 125 MCG tablet Take 1 tablet (125 mcg total) by mouth daily. 30 tablet 2   loratadine (CLARITIN) 10 MG tablet Take 10 mg by mouth  daily.     montelukast (SINGULAIR) 10 MG tablet Take 1 tablet (10 mg total) by mouth at bedtime. 30 tablet 5   Wheat Dextrin (BENEFIBER PO) Take by mouth.     No current facility-administered medications for this visit.    Allergies as of 05/30/2023 - Review Complete 04/23/2023  Allergen Reaction Noted   Fish allergy  06/11/2017   Naproxen Rash 06/29/2019   Shellfish allergy  06/11/2017   Penicillins Hives 07/20/2015    Family History  Problem Relation Age of Onset   Allergic rhinitis Mother    Diabetes Father    Hypertension Father    Asthma Father    Stroke Maternal Grandmother    Stroke Maternal Grandfather    Colon cancer Neg Hx    Angioedema Neg Hx    Atopy Neg Hx    Immunodeficiency Neg Hx    Urticaria Neg Hx    Breast cancer Neg Hx     Social History   Socioeconomic History   Marital status: Single    Spouse name: Not on file   Number of children: 1   Years of education: Not on file   Highest education level: 3rd grade  Occupational History   Not on file  Tobacco Use   Smoking status: Never   Smokeless tobacco: Never  Vaping Use   Vaping status: Never Used  Substance and Sexual Activity   Alcohol use: No    Alcohol/week: 0.0 standard drinks of alcohol   Drug use: No   Sexual activity: Yes    Birth control/protection: Post-menopausal  Other Topics Concern   Not on file  Social History Narrative   Pt was born in [place]   Social Drivers of Health   Financial Resource Strain: Not on file  Food Insecurity: Food Insecurity Present (04/23/2023)   Hunger Vital Sign    Worried About Running Out of Food in the Last Year: Sometimes true    Ran Out of Food in the Last Year: Sometimes true  Transportation Needs: Unmet Transportation Needs (02/08/2022)   PRAPARE - Administrator, Civil Service (Medical): Yes    Lack of Transportation (Non-Medical): Yes  Physical Activity: Not on file  Stress: Not on file  Social Connections: Not on file     Review of Systems: Gen: Denies fever, chills, anorexia. Denies fatigue, weakness, weight loss.  CV: Denies chest pain, palpitations, syncope, peripheral edema, and claudication. Resp: Denies dyspnea at rest, cough, wheezing, coughing up blood, and pleurisy. GI: Denies vomiting blood, jaundice, and fecal incontinence.   Denies dysphagia or odynophagia. Derm: Denies rash, itching, dry skin Psych: Denies depression, anxiety, memory loss, confusion. No homicidal or suicidal ideation.  Heme: Denies bruising, bleeding, and enlarged lymph nodes.  Physical Exam: There were no vitals taken for this visit. General:   Alert and oriented. No distress noted. Pleasant and cooperative.  Head:  Normocephalic and atraumatic. Eyes:  Conjuctiva clear without scleral icterus. Heart:  S1,  S2 present without murmurs appreciated. Lungs:  Clear to auscultation bilaterally. No wheezes, rales, or rhonchi. No distress.  Abdomen:  +BS, soft, non-tender and non-distended. No rebound or guarding. No HSM or masses noted. Msk:  Symmetrical without gross deformities. Normal posture. Extremities:  Without edema. Neurologic:  Alert and  oriented x4 Psych:  Normal mood and affect.    Assessment:     Plan:  ***   Ermalinda Memos, PA-C [facility] 05/30/2023

## 2023-05-30 ENCOUNTER — Ambulatory Visit: Payer: No Typology Code available for payment source | Admitting: Gastroenterology

## 2023-06-03 ENCOUNTER — Encounter: Payer: Self-pay | Admitting: Gastroenterology

## 2023-06-10 ENCOUNTER — Other Ambulatory Visit: Payer: Self-pay

## 2023-06-11 NOTE — Progress Notes (Unsigned)
 Referring Provider: Marcine Matar, MD Primary Care Physician:  Marcine Matar, MD Primary GI Physician: Dr. Jena Gauss  Chief Complaint  Patient presents with   Constipation    Having problems with constipation     HPI:   Alexandra Brewer is a 68 y.o. female  with history of chronic constipation, GERD, cholecystectomy, minimally elevated alk phos with normal AMA and GGT, presenting today for 6 week follow-up of upper abdominal burning, nausea, and constipation.    At her last visit 04/18/23, she reported new onset epigastric burning since early December 2024. Associated sensation of needing to burp, occasional nausea, and early satiety. Previously on pantoprazole 40 mg daily but increase to twice daily on 12/24 with no improvement in symptoms. She had labs in December that were all normal.  Also with chronic constipation not adequately managed with lactulose and MiraLAX. Recommendations included change pantoprazole to Nexium 40 mg BID, GERD diet, Miralax 17 g BID until bowels moving well, then once daily.  May need to consider GES as early satiety was actually reported prior to last EGD.      Today:  Epigastric burning:  Resolved with Nexium BID. No upper GI concerns.   Constipation:  States she is still struggling with constipation. Taking MiraLAX twice a day. Bowels every other day. Not complete. No brbpr or melena.  When constipation is bad, feels a little ball on the outside of her rectum that is tender.    Linzess was working well in the past but too expensive. Doesn't have insurance. Just "orange card" to help with medication coverage. Had this when she was prescribed Linzess.     EGD 06/11/2022 subjectively, mildly narrowed tubular esophagus with ringed appearance s/p dilation followed by biopsy to rule out EOE, normal stomach and duodenum.  Pathology with mild vascular congestion and focal squamous ballooning suggesting reflux esophagitis, no increased intraepithelial  eosinophils.  Past Medical History:  Diagnosis Date   Asthma    Constipation    Hyperlipemia    Hypertension    Hypothyroidism    Sciatica    Thyroid disease    Urticaria     Past Surgical History:  Procedure Laterality Date   BIOPSY  06/11/2022   Procedure: BIOPSY;  Surgeon: Corbin Ade, MD;  [facility];  Service: Endoscopy;;   BREAST BIOPSY Left 2018   FIBROCYSTIC CHANGES WITH FEATURES OF RUPTURE   CATARACT EXTRACTION     CHOLECYSTECTOMY N/A 05/26/2021   Procedure: LAPAROSCOPIC CHOLECYSTECTOMY;  Surgeon: Franky Macho, MD;  Location: [facility];  Service: General;  Laterality: N/A;   COLONOSCOPY N/A 10/10/2015   one 3mm polyp removed (benign colon mucosa), diverticulosis, internal hemorrhoids, next colonoscopy 09/2025. Procedure: COLONOSCOPY;  Surgeon: Corbin Ade, MD;  [facility];  Service: Endoscopy;  Laterality: N/A;  245 - Interpreter scheduled, do NOT move   ESOPHAGOGASTRODUODENOSCOPY (EGD) WITH PROPOFOL N/A 06/11/2022   Procedure: ESOPHAGOGASTRODUODENOSCOPY (EGD) WITH PROPOFOL;  Surgeon: Corbin Ade, MD;  [facility];  Service: Endoscopy;  Laterality: N/A;  8:15 am   GALLBLADDER SURGERY  05/26/2021   MALONEY DILATION N/A 06/11/2022   Procedure: Elease Hashimoto DILATION;  Surgeon: Corbin Ade, MD;  [facility];  Service: Endoscopy;  Laterality: N/A;    Current Outpatient Medications  Medication Sig Dispense Refill   albuterol (VENTOLIN HFA) 108 (90 Base) MCG/ACT inhaler Inhale 2 puffs into the lungs every 6 (six) hours as needed for wheezing or shortness of breath.  6.7 g 1   amLODipine (NORVASC) 5 MG tablet Take 1 tablet (5 mg total) by mouth daily. 90 tablet 2   EPINEPHrine 0.3 mg/0.3 mL IJ SOAJ injection Inject 0.3 mg into the muscle as needed for anaphylaxis. 2 each 1   esomeprazole (NEXIUM) 40 MG capsule Take 1 capsule (40 mg total) by mouth 2 (two) times daily before a meal. Tomar 1 cpsula (40 mg en total) por va oral  2 (dos) veces al da antes de una comida. 60 capsule 3   Fluticasone Furoate (ARNUITY ELLIPTA) 100 MCG/ACT AEPB Inhale 1 puff into the lungs daily. 30 each 5   hydrocortisone (ANUSOL-HC) 2.5 % rectal cream Place 1 Application rectally 2 (two) times daily. Aplicar dos veces al da en el recto durante 7 das segn sea necesario para las hemorroides. 30 g 1   ipratropium (ATROVENT) 0.06 % nasal spray Place 2 sprays into both nostrils 2 (two) times daily as needed for rhinitis. 15 mL 5   levocetirizine (XYZAL) 5 MG tablet Take 1 tablet (5 mg total) by mouth in the morning and at bedtime. 60 tablet 5   levothyroxine (SYNTHROID) 125 MCG tablet Take 1 tablet (125 mcg total) by mouth daily. 30 tablet 2   loratadine (CLARITIN) 10 MG tablet Take 10 mg by mouth daily.     montelukast (SINGULAIR) 10 MG tablet Take 1 tablet (10 mg total) by mouth at bedtime. 30 tablet 5   polyethylene glycol powder (GLYCOLAX/MIRALAX) 17 GM/SCOOP powder Take by mouth once.     Wheat Dextrin (BENEFIBER PO) Take by mouth.     No current facility-administered medications for this visit.    Allergies as of 06/13/2023 - Review Complete 06/13/2023  Allergen Reaction Noted   Fish allergy  06/11/2017   Naproxen Rash 06/29/2019   Shellfish allergy  06/11/2017   Penicillins Hives 07/20/2015    Family History  Problem Relation Age of Onset   Allergic rhinitis Mother    Diabetes Father    Hypertension Father    Asthma Father    Stroke Maternal Grandmother    Stroke Maternal Grandfather    Colon cancer Neg Hx    Angioedema Neg Hx    Atopy Neg Hx    Immunodeficiency Neg Hx    Urticaria Neg Hx    Breast cancer Neg Hx     Social History   Socioeconomic History   Marital status: Single    Spouse name: Not on file   Number of children: 1   Years of education: Not on file   Highest education level: 3rd grade  Occupational History   Not on file  Tobacco Use   Smoking status: Never   Smokeless tobacco: Never  Vaping  Use   Vaping status: Never Used  Substance and Sexual Activity   Alcohol use: No    Alcohol/week: 0.0 standard drinks of alcohol   Drug use: No   Sexual activity: Yes    Birth control/protection: Post-menopausal  Other Topics Concern   Not on file  Social History Narrative   Pt was born in [place]   Social Drivers of Health   Financial Resource Strain: Not on file  Food Insecurity: Food Insecurity Present (04/23/2023)   Hunger Vital Sign    Worried About Running Out of Food in the Last Year: Sometimes true    Ran Out of Food in the Last Year: Sometimes true  Transportation Needs: Unmet Transportation Needs (02/08/2022)   PRAPARE - Transportation    Lack  of Transportation (Medical): Yes    Lack of Transportation (Non-Medical): Yes  Physical Activity: Not on file  Stress: Not on file  Social Connections: Not on file    Review of Systems: Gen: Denies fever, chills, cold or flulike symptoms, presyncope, syncope. GI: See HPI Heme: See HPI  Physical Exam: BP 130/72 (BP Location: Left Arm, Patient Position: Sitting, Cuff Size: Normal)   Pulse 77   Temp 97.9 F (36.6 C) (Temporal)   Ht 5' (1.524 m)   Wt 142 lb 12.8 oz (64.8 kg)   BMI 27.89 kg/m  General:   Alert and oriented. No distress noted. Pleasant and cooperative.  Head:  Normocephalic and atraumatic. Eyes:  Conjuctiva clear without scleral icterus. Abdomen:  +BS, soft, non-tender and non-distended. No rebound or guarding. No HSM or masses noted. Rectal: External hemorrhoid tissue without swelling at this time. No significant findings on internal exam.  Msk:  Symmetrical without gross deformities. Normal posture. Neurologic:  Alert and  oriented x4 Psych:  Normal mood and affect.    Assessment:  68 y.o. female  with history of chronic constipation, GERD, cholecystectomy, minimally elevated alk phos with normal AMA and GGT, presenting today for 6 week follow-up of upper abdominal burning, nausea, and constipation.    Epigastric pain/nausea: Resolved with changing pantoprazole twice daily to Nexium twice daily.  Constipation: Chronic.  Not adequately managed with MiraLAX twice daily.  This is responded well to Linzess, but is not able to afford this.  I am giving her samples of Linzess today and also providing her with patient assistance paperwork to complete.  External hemorrhoid: External hemorrhoid tissue without any swelling at this time noted on rectal exam today which was performed due to patient's report of a small bump on the outside of her rectum when straining to have a bowel movement.  Constipation addressed as per above which should help prevent recurrent flares.  Also providing Anusol rectal cream to use twice daily as needed.   Plan:  Continue Nexium 40 mg twice daily. Provided samples of Linzess 72 mcg for her to take once daily. Patient assistance paperwork provided for Linzess. Anusol rectal cream twice daily x 7 days as needed. Follow-up in 8 weeks on constipation.   Ermalinda Memos, PA-C [facility] 06/13/2023

## 2023-06-13 ENCOUNTER — Ambulatory Visit (INDEPENDENT_AMBULATORY_CARE_PROVIDER_SITE_OTHER): Admitting: Gastroenterology

## 2023-06-13 ENCOUNTER — Encounter: Payer: Self-pay | Admitting: Gastroenterology

## 2023-06-13 ENCOUNTER — Other Ambulatory Visit: Payer: Self-pay

## 2023-06-13 VITALS — BP 130/72 | HR 77 | Temp 97.9°F | Ht 60.0 in | Wt 142.8 lb

## 2023-06-13 DIAGNOSIS — K5900 Constipation, unspecified: Secondary | ICD-10-CM

## 2023-06-13 DIAGNOSIS — K5909 Other constipation: Secondary | ICD-10-CM

## 2023-06-13 DIAGNOSIS — K644 Residual hemorrhoidal skin tags: Secondary | ICD-10-CM

## 2023-06-13 DIAGNOSIS — R1013 Epigastric pain: Secondary | ICD-10-CM

## 2023-06-13 DIAGNOSIS — K649 Unspecified hemorrhoids: Secondary | ICD-10-CM

## 2023-06-13 MED ORDER — HYDROCORTISONE (PERIANAL) 2.5 % EX CREA
1.0000 | TOPICAL_CREAM | Freq: Two times a day (BID) | CUTANEOUS | 1 refills | Status: AC
  Filled 2023-06-13: qty 30, 10d supply, fill #0

## 2023-06-13 NOTE — Patient Instructions (Addendum)
 English instructions: Complete patient assistance paperwork for Linzess.  We are providing you with this today.  I am also providing you with some samples of Linzess 72 mcg for you to take every day.  Please let me know if this works well for you.  Continue Nexium 40 mg twice daily 30 minutes before breakfast and dinner.  The little bump that you feel beside your rectum when you strain is a hemorrhoid.  I am sending in a rectal cream that you can use twice a day for about 7 days at a time when the hemorrhoid flares.  I will plan to see you back in 8 weeks or sooner if needed.  Ermalinda Memos, PA-C [facility]   Spanish Instructions:  Complete la documentacin de asistencia al paciente para Linzess. Se la entregamos hoy.  Tambin le proporcionar algunas muestras de Linzess 72 mcg para que las tome a diario. Por favor, avseme si le funciona bien.  Contine tomando Nexium 40 mg dos veces al da 30 minutos antes del desayuno y Physicist, medical.  El pequeo bulto que siente junto al recto al hacer fuerza es una hemorroide. Le enviar una crema rectal que puede usar [employer] veces al da durante unos 7 das, siempre que la hemorroide se reactive.  Planear volver a verle en 8 semanas o antes si es necesario.  Ermalinda Memos, PA-C [facility]

## 2023-06-19 ENCOUNTER — Other Ambulatory Visit (HOSPITAL_COMMUNITY): Payer: Self-pay

## 2023-06-19 ENCOUNTER — Other Ambulatory Visit: Payer: Self-pay

## 2023-07-17 ENCOUNTER — Other Ambulatory Visit: Payer: Self-pay

## 2023-07-17 ENCOUNTER — Other Ambulatory Visit: Payer: Self-pay | Admitting: Allergy

## 2023-07-17 ENCOUNTER — Other Ambulatory Visit: Payer: Self-pay | Admitting: Internal Medicine

## 2023-07-17 DIAGNOSIS — E039 Hypothyroidism, unspecified: Secondary | ICD-10-CM

## 2023-07-17 MED ORDER — LEVOCETIRIZINE DIHYDROCHLORIDE 5 MG PO TABS
5.0000 mg | ORAL_TABLET | Freq: Two times a day (BID) | ORAL | 5 refills | Status: DC
  Filled 2023-07-17: qty 60, 30d supply, fill #0
  Filled 2023-08-22: qty 60, 30d supply, fill #1

## 2023-07-17 MED ORDER — LEVOTHYROXINE SODIUM 125 MCG PO TABS
125.0000 ug | ORAL_TABLET | Freq: Every day | ORAL | 0 refills | Status: DC
  Filled 2023-07-17: qty 30, 30d supply, fill #0

## 2023-07-18 ENCOUNTER — Other Ambulatory Visit: Payer: Self-pay

## 2023-07-18 ENCOUNTER — Other Ambulatory Visit (HOSPITAL_COMMUNITY): Payer: Self-pay

## 2023-07-22 ENCOUNTER — Other Ambulatory Visit: Payer: Self-pay

## 2023-07-22 ENCOUNTER — Encounter: Payer: Self-pay | Admitting: Internal Medicine

## 2023-07-22 ENCOUNTER — Ambulatory Visit: Payer: Self-pay | Attending: Internal Medicine | Admitting: Internal Medicine

## 2023-07-22 VITALS — BP 128/73 | HR 60 | Temp 97.7°F | Ht 60.0 in | Wt 139.0 lb

## 2023-07-22 DIAGNOSIS — E039 Hypothyroidism, unspecified: Secondary | ICD-10-CM

## 2023-07-22 DIAGNOSIS — I10 Essential (primary) hypertension: Secondary | ICD-10-CM

## 2023-07-22 DIAGNOSIS — L309 Dermatitis, unspecified: Secondary | ICD-10-CM

## 2023-07-22 DIAGNOSIS — Z23 Encounter for immunization: Secondary | ICD-10-CM

## 2023-07-22 MED ORDER — LEVOTHYROXINE SODIUM 125 MCG PO TABS
125.0000 ug | ORAL_TABLET | Freq: Every day | ORAL | 1 refills | Status: DC
  Filled 2023-07-22: qty 90, 90d supply, fill #0

## 2023-07-22 MED ORDER — ZOSTER VAC RECOMB ADJUVANTED 50 MCG/0.5ML IM SUSR: 0.5000 mL | Freq: Once | INTRAMUSCULAR | 0 refills | Status: AC

## 2023-07-22 MED ORDER — AMLODIPINE BESYLATE 5 MG PO TABS
5.0000 mg | ORAL_TABLET | Freq: Every day | ORAL | 2 refills | Status: AC
  Filled 2023-07-22: qty 90, 90d supply, fill #0

## 2023-07-22 MED ORDER — TRIAMCINOLONE ACETONIDE 0.1 % EX CREA
1.0000 | TOPICAL_CREAM | Freq: Two times a day (BID) | CUTANEOUS | 0 refills | Status: AC
  Filled 2023-07-22: qty 30, 30d supply, fill #0

## 2023-07-22 NOTE — Progress Notes (Signed)
 Patient ID: Alexandra Brewer, female    DOB: 1955/09/02  MRN: [id number]  CC: Hypertension (HTN f/u. Med refill. /Increased cold sweats, cold fingers - unable to check BP/Discuss shingles vax)   Subjective: Alexandra Brewer is a 68 y.o. female who presents for chronic ds management. Her concerns today include:  HTN, thyroid ds, HL, GERD, Frozen shoulder RT, asthma mild persistent and food allergy (followed by allergy/asthma ctr)   AMN Language interpreter used during this encounter. #[id number]  HTN:  taking and tolerating Norvasc  5 mg daily. One day last wk she felt cold and had stomach pain.  Not sure if BP was high as BP device had broken a few days before this happened. Limits salt in foods No CP/SOB  Hypothyroid:  last TSH was low at 0.395.  She was on Levothyroxine  137 mcg but dose decreased to 125 mcg.  She has been taking this dose consistently since we saw her 4 mths ago.  C/o having spots on her back that she describes as  "stains" on her back for past 2-3 mths.  Skin on the upper back is dry and itchy.  Wants to know if the "stains" are due to meds  HM:  due for 2nd shingles vaccine  Patient Active Problem List   Diagnosis Date Noted   Mild persistent asthma without complication 04/08/2023   Seasonal and perennial allergic rhinitis 04/08/2023   Allergic conjunctivitis of both eyes 04/08/2023   Chronic urticaria 04/08/2023   Allergy with anaphylaxis due to food, subsequent encounter 04/08/2023   Elevated LFTs 05/18/2022   RUQ pain 05/18/2022   Abdominal pain, epigastric 05/16/2021   Esophageal dysphagia 05/16/2021   Calculus of gallbladder without cholecystitis without obstruction 01/27/2021   Fatty liver 01/27/2021   Class 1 obesity due to excess calories with serious comorbidity and body mass index (BMI) of 31.0 to 31.9 in adult 01/27/2021   Long term (current) use of non-steroidal anti-inflammatories (nsaid) 01/12/2021   Migraine without aura and without  status migrainosus, not intractable 05/31/2020   Abdominal pain 03/10/2018   Sciatica of right side 06/11/2017   RUQ abdominal pain 01/23/2017   GERD (gastroesophageal reflux disease) 11/26/2016   Right Achilles tendinitis 08/15/2016   Hypertension 07/05/2016   Adhesive capsulitis of right shoulder 02/21/2016   Heel pain, bilateral 01/10/2016   Varicose veins of left lower extremity with pain 01/10/2016   History of colonic polyps    Diverticulosis of colon without hemorrhage    Hypothyroidism 08/10/2015   Chronic constipation 08/05/2015   Hyperlipidemia 08/05/2015     Current Outpatient Medications on File Prior to Visit  Medication Sig Dispense Refill   albuterol  (VENTOLIN  HFA) 108 (90 Base) MCG/ACT inhaler Inhale 2 puffs into the lungs every 6 (six) hours as needed for wheezing or shortness of breath. 6.7 g 1   amLODipine  (NORVASC ) 5 MG tablet Take 1 tablet (5 mg total) by mouth daily. 90 tablet 2   EPINEPHrine  0.3 mg/0.3 mL IJ SOAJ injection Inject 0.3 mg into the muscle as needed for anaphylaxis. 2 each 1   esomeprazole  (NEXIUM ) 40 MG capsule Take 1 capsule (40 mg total) by mouth 2 (two) times daily before a meal. Tomar 1 cpsula (40 mg en total) por va oral 2 (dos) veces al da antes de una comida. 60 capsule 3   Fluticasone  Furoate (ARNUITY ELLIPTA ) 100 MCG/ACT AEPB Inhale 1 puff into the lungs daily. 30 each 5   hydrocortisone  (ANUSOL -HC) 2.5 % rectal cream Place  1 Application rectally 2 (two) times daily. Aplicar dos veces al da en el recto durante 7 das segn sea necesario para las hemorroides. 30 g 1   ipratropium (ATROVENT ) 0.06 % nasal spray Place 2 sprays into both nostrils 2 (two) times daily as needed for rhinitis. 15 mL 5   levocetirizine (XYZAL ) 5 MG tablet Take 1 tablet (5 mg total) by mouth in the morning and at bedtime. 60 tablet 5   levothyroxine  (SYNTHROID ) 125 MCG tablet Take 1 tablet (125 mcg total) by mouth daily. 30 tablet 0   loratadine  (CLARITIN ) 10 MG  tablet Take 10 mg by mouth daily.     montelukast  (SINGULAIR ) 10 MG tablet Take 1 tablet (10 mg total) by mouth at bedtime. 30 tablet 5   polyethylene glycol powder (GLYCOLAX /MIRALAX ) 17 GM/SCOOP powder Take by mouth once.     Wheat Dextrin (BENEFIBER PO) Take by mouth.     No current facility-administered medications on file prior to visit.    Allergies  Allergen Reactions   Fish Allergy     Throat swell up   Naproxen Rash   Shellfish Allergy     Throat swell up   Penicillins Hives    Social History   Socioeconomic History   Marital status: Single    Spouse name: Not on file   Number of children: 1   Years of education: Not on file   Highest education level: 3rd grade  Occupational History   Not on file  Tobacco Use   Smoking status: Never   Smokeless tobacco: Never  Vaping Use   Vaping status: Never Used  Substance and Sexual Activity   Alcohol use: No    Alcohol/week: 0.0 standard drinks of alcohol   Drug use: No   Sexual activity: Yes    Birth control/protection: Post-menopausal  Other Topics Concern   Not on file  Social History Narrative   Pt was born in [place]   Social Drivers of Health   Financial Resource Strain: Not on file  Food Insecurity: No Food Insecurity (07/22/2023)   Hunger Vital Sign    Worried About Running Out of Food in the Last Year: Never true    Ran Out of Food in the Last Year: Never true  Recent Concern: Food Insecurity - Food Insecurity Present (04/23/2023)   Hunger Vital Sign    Worried About Running Out of Food in the Last Year: Sometimes true    Ran Out of Food in the Last Year: Sometimes true  Transportation Needs: No Transportation Needs (07/22/2023)   PRAPARE - Administrator, Civil Service (Medical): No    Lack of Transportation (Non-Medical): No  Physical Activity: Not on file  Stress: Not on file  Social Connections: Not on file  Intimate Partner Violence: Not At Risk (07/22/2023)   Humiliation, Afraid, Rape,  and Kick questionnaire    Fear of Current or Ex-Partner: No    Emotionally Abused: No    Physically Abused: No    Sexually Abused: No    Family History  Problem Relation Age of Onset   Allergic rhinitis Mother    Diabetes Father    Hypertension Father    Asthma Father    Stroke Maternal Grandmother    Stroke Maternal Grandfather    Colon cancer Neg Hx    Angioedema Neg Hx    Atopy Neg Hx    Immunodeficiency Neg Hx    Urticaria Neg Hx    Breast cancer Neg Hx  Past Surgical History:  Procedure Laterality Date   BIOPSY  06/11/2022   Procedure: BIOPSY;  Surgeon: Suzette Espy, MD;  [facility];  Service: Endoscopy;;   BREAST BIOPSY Left 2018   FIBROCYSTIC CHANGES WITH FEATURES OF RUPTURE   CATARACT EXTRACTION     CHOLECYSTECTOMY N/A 05/26/2021   Procedure: LAPAROSCOPIC CHOLECYSTECTOMY;  Surgeon: Alanda Allegra, MD;  Location: [facility];  Service: General;  Laterality: N/A;   COLONOSCOPY N/A 10/10/2015   one 3mm polyp removed (benign colon mucosa), diverticulosis, internal hemorrhoids, next colonoscopy 09/2025. Procedure: COLONOSCOPY;  Surgeon: Suzette Espy, MD;  [facility];  Service: Endoscopy;  Laterality: N/A;  245 - Interpreter scheduled, do NOT move   ESOPHAGOGASTRODUODENOSCOPY (EGD) WITH PROPOFOL  N/A 06/11/2022   Procedure: ESOPHAGOGASTRODUODENOSCOPY (EGD) WITH PROPOFOL ;  Surgeon: Suzette Espy, MD;  [facility];  Service: Endoscopy;  Laterality: N/A;  8:15 am   GALLBLADDER SURGERY  05/26/2021   MALONEY DILATION N/A 06/11/2022   Procedure: Londa Rival DILATION;  Surgeon: Suzette Espy, MD;  [facility];  Service: Endoscopy;  Laterality: N/A;    ROS: Review of Systems Negative except as stated above  PHYSICAL EXAM: BP 128/73 (BP Location: Left Arm, Patient Position: Sitting, Cuff Size: Normal)   Pulse 60   Temp 97.7 F (36.5 C) (Oral)   Ht 5' (1.524 m)   Wt 139 lb (63 kg)   SpO2 100%   BMI 27.15 kg/m   Physical  Exam  General appearance - alert, well appearing, and in no distress Mental status - normal mood, behavior, speech, dress, motor activity, and thought processes Neck - supple, no significant adenopathy Chest - clear to auscultation, no wheezes, rales or rhonchi, symmetric air entry Heart - normal rate, regular rhythm, normal S1, S2, no murmurs, rubs, clicks or gallops Extremities - peripheral pulses normal, no pedal edema, no clubbing or cyanosis Skin -posterior thorax: She has a dry eczematoid hyperpigmented appearing rash over the left scapular.  See picture below.       Latest Ref Rng & Units 03/21/2023    9:49 AM 11/19/2022    9:52 AM 05/21/2022   11:41 AM  CMP  Glucose 70 - 99 mg/dL 80  87  81   BUN 8 - 27 mg/dL 10  12  10    Creatinine 0.57 - 1.00 mg/dL 1.61  0.96  0.45   Sodium 134 - 144 mmol/L 141  141  139   Potassium 3.5 - 5.2 mmol/L 3.9  4.4  4.7   Chloride 96 - 106 mmol/L 104  103  100   CO2 20 - 29 mmol/L 24  25  25    Calcium  8.7 - 10.3 mg/dL 9.5  9.7  40.9   Total Protein 6.0 - 8.5 g/dL 7.2  7.4  7.9   Total Bilirubin 0.0 - 1.2 mg/dL 0.6  0.2  0.3   Alkaline Phos 44 - 121 IU/L 120  122  121   AST 0 - 40 IU/L 25  19  17    ALT 0 - 32 IU/L 22  16  17     Lipid Panel     Component Value Date/Time   CHOL 223 (H) 05/21/2022 1141   TRIG 143 05/21/2022 1141   HDL 53 05/21/2022 1141   CHOLHDL 4.2 05/21/2022 1141   CHOLHDL 3.8 07/14/2015 0908   VLDL 18 07/14/2015 0908   LDLCALC 144 (H) 05/21/2022 1141    CBC    Component Value Date/Time  WBC 8.8 03/21/2023 0949   WBC 7.7 05/23/2021 1321   RBC 4.42 03/21/2023 0949   RBC 4.75 05/23/2021 1321   HGB 12.7 03/21/2023 0949   HCT 39.0 03/21/2023 0949   PLT 381 03/21/2023 0949   MCV 88 03/21/2023 0949   MCH 28.7 03/21/2023 0949   MCH 29.1 05/23/2021 1321   MCHC 32.6 03/21/2023 0949   MCHC 32.7 05/23/2021 1321   RDW 12.5 03/21/2023 0949   LYMPHSABS 2.4 05/21/2022 0854   MONOABS 0.8 05/23/2021 1321   EOSABS 0.1  05/21/2022 0854   BASOSABS 0.1 05/21/2022 0854    ASSESSMENT AND PLAN: 1. Essential hypertension (Primary) At goal.  Continue amlodipine  5 mg daily. - amLODipine  (NORVASC ) 5 MG tablet; Take 1 tablet (5 mg total) by mouth daily.  Dispense: 90 tablet; Refill: 2  2. Acquired hypothyroidism We will check TSH today. - levothyroxine  (SYNTHROID ) 125 MCG tablet; Take 1 tablet (125 mcg total) by mouth daily.  Dispense: 90 tablet; Refill: 1 - TSH  3. Need for shingles vaccine Printed prescription given for Shingrix  vaccine. - Zoster Vaccine Adjuvanted (SHINGRIX ) injection; Inject 0.5 mLs into the muscle once for 1 dose.  Dispense: 0.5 mL; Refill: 0  4. Dermatitis Looks like this may be eczema but not sure.  Not present on other parts of the body.  Will prescribe some triamcinolone  cream and refer to dermatology. - triamcinolone  cream (KENALOG ) 0.1 %; Apply 1 Application topically 2 (two) times daily.  Dispense: 30 g; Refill: 0    Patient was given the opportunity to ask questions.  Patient verbalized understanding of the plan and was able to repeat key elements of the plan.   This documentation was completed using Paediatric nurse.  Any transcriptional errors are unintentional.  No orders of the defined types were placed in this encounter.    Requested Prescriptions   Pending Prescriptions Disp Refills   amLODipine  (NORVASC ) 5 MG tablet 90 tablet 2    Sig: Take 1 tablet (5 mg total) by mouth daily.   levothyroxine  (SYNTHROID ) 125 MCG tablet 90 tablet 1    Sig: Take 1 tablet (125 mcg total) by mouth daily.    No follow-ups on file.  Concetta Dee, MD, FACP

## 2023-07-23 ENCOUNTER — Other Ambulatory Visit: Payer: Self-pay | Admitting: Internal Medicine

## 2023-07-23 ENCOUNTER — Other Ambulatory Visit: Payer: Self-pay

## 2023-07-23 DIAGNOSIS — E039 Hypothyroidism, unspecified: Secondary | ICD-10-CM

## 2023-07-23 LAB — TSH: TSH: 0.385 u[IU]/mL — ABNORMAL LOW (ref 0.450–4.500)

## 2023-07-23 MED ORDER — LEVOTHYROXINE SODIUM 100 MCG PO TABS
100.0000 ug | ORAL_TABLET | Freq: Every day | ORAL | 3 refills | Status: AC
  Filled 2023-07-23: qty 30, 30d supply, fill #0
  Filled 2023-08-22: qty 30, 30d supply, fill #1

## 2023-08-06 NOTE — Progress Notes (Unsigned)
 Referring Provider: Lawrance Presume, MD Primary Care Physician:  Lawrance Presume, MD Primary GI Physician: Dr. Riley Cheadle  Chief Complaint  Patient presents with   Follow-up    Still having a little bit of constipation    HPI:   Alexandra Brewer is a 68 y.o. female with history of chronic constipation, GERD, cholecystectomy, minimally elevated alk phos with normal AMA and GGT, presenting today for follow-up of constipation.  Last seen in the office 06/13/2023.  Prior epigastric burning and nausea had resolved after changing pantoprazole  to Nexium  twice daily.  Chronic constipation was not adequately managed with MiraLAX  twice daily.  She previously responded to Linzess , but was not able to afford this.  She was provided samples of Linzess  72 mcg as well as patient assistance paperwork for Linzess .  She was also given Anusol  rectal cream to use for external hemorrhoids as needed.  Today: Used in person [employer]  Skipping a couple days between bowel movements currently taking MiraLAX  daily.  Linzess  72 mcg helps, but only taking as needed as she only has samples that we provided her.States she filled out patient assistance paperwork for Linzess  and sent it in herself and was told it was incomplete and more information was needed.   No abdominal pain, brbpr, melena.   No upper GI concerns.  Doing well on Nexium  40 mg twice daily.  Past Medical History:  Diagnosis Date   Asthma    Constipation    Hyperlipemia    Hypertension    Hypothyroidism    Sciatica    Thyroid disease    Urticaria     Past Surgical History:  Procedure Laterality Date   BIOPSY  06/11/2022   Procedure: BIOPSY;  Surgeon: Suzette Espy, MD;  [facility];  Service: Endoscopy;;   BREAST BIOPSY Left 2018   FIBROCYSTIC CHANGES WITH FEATURES OF RUPTURE   CATARACT EXTRACTION     CHOLECYSTECTOMY N/A 05/26/2021   Procedure: LAPAROSCOPIC CHOLECYSTECTOMY;  Surgeon: Alanda Allegra, MD;   Location: [facility];  Service: General;  Laterality: N/A;   COLONOSCOPY N/A 10/10/2015   one 3mm polyp removed (benign colon mucosa), diverticulosis, internal hemorrhoids, next colonoscopy 09/2025. Procedure: COLONOSCOPY;  Surgeon: Suzette Espy, MD;  [facility];  Service: Endoscopy;  Laterality: N/A;  245 - Interpreter scheduled, do NOT move   ESOPHAGOGASTRODUODENOSCOPY (EGD) WITH PROPOFOL  N/A 06/11/2022   Procedure: ESOPHAGOGASTRODUODENOSCOPY (EGD) WITH PROPOFOL ;  Surgeon: Suzette Espy, MD;  [facility];  Service: Endoscopy;  Laterality: N/A;  8:15 am   GALLBLADDER SURGERY  05/26/2021   MALONEY DILATION N/A 06/11/2022   Procedure: Londa Rival DILATION;  Surgeon: Suzette Espy, MD;  [facility];  Service: Endoscopy;  Laterality: N/A;    Current Outpatient Medications  Medication Sig Dispense Refill   albuterol  (VENTOLIN  HFA) 108 (90 Base) MCG/ACT inhaler Inhale 2 puffs into the lungs every 6 (six) hours as needed for wheezing or shortness of breath. 6.7 g 1   amLODipine  (NORVASC ) 5 MG tablet Take 1 tablet (5 mg total) by mouth daily. 90 tablet 2   EPINEPHrine  0.3 mg/0.3 mL IJ SOAJ injection Inject 0.3 mg into the muscle as needed for anaphylaxis. 2 each 1   esomeprazole  (NEXIUM ) 40 MG capsule Take 1 capsule (40 mg total) by mouth 2 (two) times daily before a meal. Tomar 1 cpsula (40 mg en total) por va oral 2 (dos) veces al da antes de una comida. 60  capsule 3   Fluticasone  Furoate (ARNUITY ELLIPTA ) 100 MCG/ACT AEPB Inhale 1 puff into the lungs daily. 30 each 5   hydrocortisone  (ANUSOL -HC) 2.5 % rectal cream Place 1 Application rectally 2 (two) times daily. Aplicar dos veces al da en el recto durante 7 das segn sea necesario para las hemorroides. 30 g 1   ipratropium (ATROVENT ) 0.06 % nasal spray Place 2 sprays into both nostrils 2 (two) times daily as needed for rhinitis. 15 mL 5   levocetirizine (XYZAL ) 5 MG tablet Take 1 tablet (5 mg total) by mouth in  the morning and at bedtime. 60 tablet 5   levothyroxine  (SYNTHROID ) 100 MCG tablet Take 1 tablet (100 mcg total) by mouth daily. 30 tablet 3   loratadine  (CLARITIN ) 10 MG tablet Take 10 mg by mouth daily.     montelukast  (SINGULAIR ) 10 MG tablet Take 1 tablet (10 mg total) by mouth at bedtime. 30 tablet 5   polyethylene glycol powder (GLYCOLAX /MIRALAX ) 17 GM/SCOOP powder Take by mouth once.     triamcinolone  cream (KENALOG ) 0.1 % Apply 1 Application topically 2 (two) times daily. 30 g 0   Wheat Dextrin (BENEFIBER PO) Take by mouth.     No current facility-administered medications for this visit.    Allergies as of 08/08/2023 - Review Complete 08/08/2023  Allergen Reaction Noted   Fish allergy  06/11/2017   Naproxen Rash 06/29/2019   Shellfish allergy  06/11/2017   Penicillins Hives 07/20/2015    Family History  Problem Relation Age of Onset   Allergic rhinitis Mother    Diabetes Father    Hypertension Father    Asthma Father    Stroke Maternal Grandmother    Stroke Maternal Grandfather    Colon cancer Neg Hx    Angioedema Neg Hx    Atopy Neg Hx    Immunodeficiency Neg Hx    Urticaria Neg Hx    Breast cancer Neg Hx     Social History   Socioeconomic History   Marital status: Single    Spouse name: Not on file   Number of children: 1   Years of education: Not on file   Highest education level: 3rd grade  Occupational History   Not on file  Tobacco Use   Smoking status: Never   Smokeless tobacco: Never  Vaping Use   Vaping status: Never Used  Substance and Sexual Activity   Alcohol use: No    Alcohol/week: 0.0 standard drinks of alcohol   Drug use: No   Sexual activity: Yes    Birth control/protection: Post-menopausal  Other Topics Concern   Not on file  Social History Narrative   Pt was born in [place]   Social Drivers of Health   Financial Resource Strain: Not on file  Food Insecurity: No Food Insecurity (07/22/2023)   Hunger Vital Sign    Worried About  Running Out of Food in the Last Year: Never true    Ran Out of Food in the Last Year: Never true  Recent Concern: Food Insecurity - Food Insecurity Present (04/23/2023)   Hunger Vital Sign    Worried About Running Out of Food in the Last Year: Sometimes true    Ran Out of Food in the Last Year: Sometimes true  Transportation Needs: No Transportation Needs (07/22/2023)   PRAPARE - Administrator, Civil Service (Medical): No    Lack of Transportation (Non-Medical): No  Physical Activity: Not on file  Stress: Not on file  Social  Connections: Not on file    Review of Systems: Gen: Denies fever, chills, cold or flulike symptoms, presyncope, syncope. GI: See HPI Heme: See HPI  Physical Exam: BP 130/74 (BP Location: Right Arm, Patient Position: Sitting, Cuff Size: Normal)   Pulse 67   Temp 98.4 F (36.9 C) (Oral)   Ht 5' (1.524 m)   Wt 141 lb 9.6 oz (64.2 kg)   SpO2 99%   BMI 27.65 kg/m  General:   Alert and oriented. No distress noted. Pleasant and cooperative.  Head:  Normocephalic and atraumatic. Eyes:  Conjuctiva clear without scleral icterus. Abdomen:  +BS, soft, non-tender and non-distended. No rebound or guarding. No HSM or masses noted. Msk:  Symmetrical without gross deformities. Normal posture. Neurologic:  Alert and  oriented x4 Psych:  Normal mood and affect.    Assessment:  Constipation:  Chronic. Not adequately managed with MiraLAX  daily. Symptoms respond well to Linzess  72 mcg, but unable to afford this.  Previously attempted to complete Linzess  patient assistance paperwork, but patient sent paperwork in on her own without supportive documentation from our office and was denied as they stated the application was incomplete.  Advised that she needs to fill out her portion of the application and we would submit the paperwork for her.    Plan:  Increase MiraLAX  17 g twice daily. Complete patient assistance for Linzess  72 mcg. Patient is aware to return  forms to our office so we can submit for her.  Follow-up in 3 months or sooner if needed.   Shana Daring, PA-C [facility] 08/08/2023

## 2023-08-08 ENCOUNTER — Encounter: Payer: Self-pay | Admitting: Gastroenterology

## 2023-08-08 ENCOUNTER — Ambulatory Visit: Payer: Self-pay | Admitting: Gastroenterology

## 2023-08-08 VITALS — BP 130/74 | HR 67 | Temp 98.4°F | Ht 60.0 in | Wt 141.6 lb

## 2023-08-08 DIAGNOSIS — K5909 Other constipation: Secondary | ICD-10-CM

## 2023-08-08 NOTE — Patient Instructions (Addendum)
 English: We are providing you with patient assistance paperwork for Linzess . Please return your filled out portion to our office and we will submit it for you.   In the meantime, increase MiraLAX  to 1 capful (17 g) twice daily in 8 ounces of water  or other noncarbonated beverage of your choice.  I will plan to see you back in 3 months or sooner if needed.  Shana Daring, PA-C [facility] G[facility]   Spanish: [employer] estamos proporcionando la documentacin de asistencia al paciente de Linzess . Por favor, devuelva la parte completa a nuestra oficina y la entregaremos por usted.  Mientras tanto, aumente la dosis de MiraLAX  a 1 tapn (17 g) dos veces al da en 237 ml (8 onzas) de agua u [place] bebida sin gas de su eleccin.  Planeo volver a verlo en 3 meses o antes si es necesario.  Shana Daring, PA-C [facility] Gastroenter[facility]

## 2023-08-22 ENCOUNTER — Other Ambulatory Visit: Payer: Self-pay

## 2023-09-06 ENCOUNTER — Other Ambulatory Visit: Payer: Self-pay

## 2023-10-11 ENCOUNTER — Ambulatory Visit: Payer: No Typology Code available for payment source | Admitting: Allergy

## 2023-11-01 ENCOUNTER — Ambulatory Visit (INDEPENDENT_AMBULATORY_CARE_PROVIDER_SITE_OTHER): Admitting: Internal Medicine

## 2023-11-01 ENCOUNTER — Other Ambulatory Visit: Payer: Self-pay

## 2023-11-01 VITALS — BP 110/80 | HR 65 | Temp 98.3°F | Ht 60.0 in | Wt 139.7 lb

## 2023-11-01 DIAGNOSIS — L508 Other urticaria: Secondary | ICD-10-CM

## 2023-11-01 DIAGNOSIS — J4530 Mild persistent asthma, uncomplicated: Secondary | ICD-10-CM

## 2023-11-01 DIAGNOSIS — J302 Other seasonal allergic rhinitis: Secondary | ICD-10-CM

## 2023-11-01 DIAGNOSIS — J3089 Other allergic rhinitis: Secondary | ICD-10-CM

## 2023-11-01 DIAGNOSIS — T7800XD Anaphylactic reaction due to unspecified food, subsequent encounter: Secondary | ICD-10-CM

## 2023-11-01 MED ORDER — EPINEPHRINE 0.3 MG/0.3ML IJ SOAJ
0.3000 mg | INTRAMUSCULAR | 1 refills | Status: AC | PRN
  Filled 2023-11-01: qty 2, fill #0

## 2023-11-01 MED ORDER — MONTELUKAST SODIUM 10 MG PO TABS
10.0000 mg | ORAL_TABLET | Freq: Every day | ORAL | 5 refills | Status: AC
  Filled 2023-11-01: qty 30, 30d supply, fill #0

## 2023-11-01 MED ORDER — ARNUITY ELLIPTA 100 MCG/ACT IN AEPB
100.0000 ug | INHALATION_SPRAY | Freq: Every day | RESPIRATORY_TRACT | 5 refills | Status: AC
Start: 2023-11-01 — End: ?
  Filled 2023-11-01: qty 30, fill #0

## 2023-11-01 MED ORDER — LEVOCETIRIZINE DIHYDROCHLORIDE 5 MG PO TABS
5.0000 mg | ORAL_TABLET | Freq: Two times a day (BID) | ORAL | 5 refills | Status: AC | PRN
  Filled 2023-11-01: qty 60, 30d supply, fill #0

## 2023-11-01 MED ORDER — ALBUTEROL SULFATE HFA 108 (90 BASE) MCG/ACT IN AERS
2.0000 | INHALATION_SPRAY | Freq: Four times a day (QID) | RESPIRATORY_TRACT | 1 refills | Status: AC | PRN
  Filled 2023-11-01: qty 6.7, 25d supply, fill #0

## 2023-11-01 MED ORDER — IPRATROPIUM BROMIDE 0.06 % NA SOLN
2.0000 | Freq: Three times a day (TID) | NASAL | 5 refills | Status: AC | PRN
  Filled 2023-11-01: qty 15, 25d supply, fill #0

## 2023-11-01 NOTE — Patient Instructions (Addendum)
 Allergic rhinitis with conjunctivitis  -SPT 03/2020: positive to tree pollen and molds -Continue Xyzal  5mg  daily. Can take an extra dose if needed. This replaces Zyrtec  (cetrizine).   -Restart Singulair  10 mg daily.  -Continue Atrovent  2 sprays each nostril three times a day as needed for runny nose.   Mild persistent asthma - Maintenance inhaler: restart Singulair  10mg  daily. - With respiratory illness or flare ups, start Arnuity 100mcg 1 puff daily for 1-2 weeks.  - Rescue inhaler: Albuterol  2 puffs via spacer or 1 vial via nebulizer every 4-6 hours as needed for respiratory symptoms of cough, shortness of breath, or wheezing Asthma control goals:  Full participation in all desired activities (may need albuterol  before activity) Albuterol  use two times or less a week on average (not counting use with activity) Cough interfering with sleep two times or less a month Oral steroids no more than once a year No hospitalizations  Food allergy: -Continue avoid red meat (mammalian meat), fish and shellfish.  - for SKIN only reaction, okay to take Benadryl 25mg  capsules every 6 hours as needed - for SKIN + ANY additional symptoms, OR IF concern for LIFE THREATENING reaction = Epipen  Autoinjector EpiPen  0.3 mg. - If using Epinephrine  autoinjector, call 911 or go to the ER.   Chronic urticaria (hives) -Continue Xyzal  5mg  daily. If hives flare up, increase to Xyzal  5mg  twice daily.     Rinitis alrgica con conjuntivitis - positivo a polen de rboles y moho - Continuar con Xyzal  5 mg al da. Se puede tomar una dosis adicional si es necesario. Esto reemplaza a Zyrtec  (cetrizina). - Reiniciar Singulair  10 mg al da. - Continuar con Atrovent  2 pulverizaciones en cada fosa nasal tres veces al da, segn sea necesario, para la rinorrea.  Asma leve persistente - Inhalador de mantenimiento: reiniciar Singulair  10 mg al da. - En caso de enfermedad respiratoria o brotes, iniciar Arnuity 100 mcg, 1  pulsacin al da durante 1-2 semanas. Inhalador de rescate: Albuterol : 2 inhalaciones mediante cmara espaciadora o 1 vial mediante nebulizador cada 4-6 horas, segn sea necesario, para sntomas respiratorios como tos, dificultad para respirar o sibilancias.  Objetivos para el control del asma:  Participacin plena en todas las actividades deseadas (puede necesitar albuterol  antes de la [place]).  Uso de albuterol  dos veces o menos por semana en promedio (sin contar el uso durante la [place]).  Tos que interfiere con el sueo dos veces o menos al mes.  Esteroides orales: no ms de una vez al ao.  No hospitalizaciones.  Alergia alimentaria: - Contine evitando la carne roja (carne de mamferos), el pescado y los mariscos. - Para reacciones cutneas nicamente, puede tomar cpsulas de Benadryl de 25 mg cada 6 horas, segn sea necesario. - Para reacciones cutneas y cualquier otro sntoma, o si existe la preocupacin de una reaccin potencialmente mortal, utilice el autoinyector EpiPen  (EpiPen  0.3 mg). - Si usa  el autoinyector de epinefrina, llame al 911 o acuda a urgencias.   Urticaria crnica (ronchas) - Contine con Xyzal  5 mg al da. Si la urticaria se agrava, aumente la dosis a Xyzal  5 mg dos veces al [employer].

## 2023-11-01 NOTE — Progress Notes (Signed)
 FOLLOW UP Date of Service/Encounter:  11/01/23   Subjective:  Alexandra Brewer (DOB: January 17, 1956) is a 68 y.o. female who [facility] for follow up for asthma, allergic rhinitis, urticaria and food allergies.   History obtained from: chart review and patient. Last visit was on with [facility]- controlled on Singulair , rarely uses Arnuity  AR- on Azelastine , Xyzal , Pataday  eye drops No hives Avoids fish, shellfish, mammalian meats  Interpretor is present.   Asthma: Reports overall okay well.  About a month ago, had a mild flare up with chest tightness; she took Arnuity and Albuterol  for a few days and it resolved.  Another doctor told her to take Albuterol  daily but she does not as she is feeling fine.  No ER visits/oral prednisone  use since last visit.  Takes Singulair  PRN which does help her.   Allergies: Does note frequent congestion, runny nose, throat drainage.  Takes Zyrtec  daily and Ipratropium spray only with nasal discharge, few times a week.  Taking Singulair  PRN.   Hives: Occurs only when gardening.  Otherwise doing well.  Taking Zyrtec  daily  Food Allergies: Avoids red meats, fish, shellfish.  Has not had to use Epipen .    Past Medical History: Past Medical History:  Diagnosis Date   Asthma    Constipation    Hyperlipemia    Hypertension    Hypothyroidism    Sciatica    Thyroid disease    Urticaria     Objective:  BP 110/80   Pulse 65   Temp 98.3 F (36.8 C)   Ht 5' (1.524 m)   Wt 139 lb 11.2 oz (63.4 kg)   SpO2 99%   BMI 27.28 kg/m  Body mass index is 27.28 kg/m. Physical Exam: GEN: alert, well developed HEENT: clear conjunctiva, nose with mild inferior turbinate hypertrophy, pink nasal mucosa, clear rhinorrhea, slight cobblestoning HEART: regular rate and rhythm, no murmur LUNGS: clear to auscultation bilaterally, no coughing, unlabored respiration SKIN: no rashes or lesions  Spirometry:   Tracings reviewed. Her effort: It was hard to get consistent efforts and there is a question as to whether this reflects a maximal maneuver. FVC: 1.64 L, 60% predicted  FEV1: 1.35 L, 62% predicted FEV1/FVC ratio: 82% Interpretation: Spirometry consistent with possible restrictive disease.  Please see scanned spirometry results for details.  Assessment:   1. Chronic urticaria   2. Seasonal and perennial allergic rhinitis   3. Mild persistent asthma without complication   4. Allergy with anaphylaxis due to food, subsequent encounter     Plan/Recommendations:  Allergic rhinitis with conjunctivitis  - Uncontrolled, restart Singulair  and will switch to Xyzal .  -SPT 03/2020: positive to tree pollen and molds -Continue Xyzal  5mg  daily. Can take an extra dose if needed.  -Restart Singulair  10 mg daily.  -Continue Atrovent  2 sprays each nostril three times a day as needed for runny nose.   Mild persistent asthma - MDI technique discussed.  Spirometry today with suboptimal effort, no obstruction but possible restriction  Controlled  - Maintenance inhaler: restart Singulair  10mg  daily. - With respiratory illness or flare ups, start Arnuity 100mcg 1 puff daily for 1-2 weeks.  - Rescue inhaler: Albuterol  2 puffs via spacer or 1 vial via nebulizer every 4-6 hours as needed for respiratory symptoms of cough, shortness of breath, or wheezing Asthma control goals:  Full participation in all desired activities (may need albuterol  before activity) Albuterol  use two times or less a week  on average (not counting use with activity) Cough interfering with sleep two times or less a month Oral steroids no more than once a year No hospitalizations   Food allergy: -Continue avoid red meat (mammalian meat), fish and shellfish. - Alpha gal IgE 05/2021: 0.2;  SPT 03/2020 positive to shellfish and negative to fish - Initial rxn: fish/shellfish- shortness of breath and facial itching; red meats cause stomach  problems, fatigue, itching.   - for SKIN only reaction, okay to take Benadryl 25mg  capsules every 6 hours as needed - for SKIN + ANY additional symptoms, OR IF concern for LIFE THREATENING reaction = Epipen  Autoinjector EpiPen  0.3 mg. - If using Epinephrine  autoinjector, call 911 or go to the ER.   Chronic urticaria (hives) - Well controlled  -Continue Xyzal  5mg  daily. If hives flare up, increase to Xyzal  5mg  twice daily.    From pharmacy: Arnuity- [email or web address] Epipen - [email or web address]  Return if symptoms worsen or fail to improve. Moving to [facility]; if she does not or comes back to [place], follow up in 6 months. She is to call to set up appointment.   Arleta Blanch, MD [facility] 

## 2023-11-01 NOTE — Addendum Note (Signed)
 Addended by: NANCEE JON SAILOR on: 11/01/2023 01:44 PM   Modules accepted: Orders

## 2023-11-11 NOTE — Progress Notes (Deleted)
 Referring Provider: Vicci Barnie NOVAK, MD Primary Care Physician:  Vicci Barnie NOVAK, MD Primary GI Physician: Dr. Shaaron  No chief complaint on file.   HPI:   Alexandra Brewer is a 68 y.o. female with history of chronic constipation, GERD, cholecystectomy, minimally elevated alk phos with normal AMA and GGT, presenting today for follow-up of constipation.   Last seen in the office 08/08/2023.  Constipation was not adequately managed with MiraLAX  daily.  Symptoms responded well to Linzess  72 mcg, but patient was not able to afford this.  Had previously attempted to complete Linzess  patient assistance paperwork, but patient sent paperwork in on her own without supportive documentation from our office and was denied as they stated the application was incomplete.  Advised that she needs to fill out her portion of the application and we would submit the paperwork for her.  Recommended increasing MiraLAX  to 17 g twice daily for now.  Today:    Past Medical History:  Diagnosis Date   Asthma    Constipation    Hyperlipemia    Hypertension    Hypothyroidism    Sciatica    Thyroid disease    Urticaria     Past Surgical History:  Procedure Laterality Date   BIOPSY  06/11/2022   Procedure: BIOPSY;  Surgeon: Shaaron Lamar HERO, MD;  Location: [facility];  Service: Endoscopy;;   BREAST BIOPSY Left 2018   FIBROCYSTIC CHANGES WITH FEATURES OF RUPTURE   CATARACT EXTRACTION     CHOLECYSTECTOMY N/A 05/26/2021   Procedure: LAPAROSCOPIC CHOLECYSTECTOMY;  Surgeon: Mavis Anes, MD;  Location: [facility];  Service: General;  Laterality: N/A;   COLONOSCOPY N/A 10/10/2015   one 3mm polyp removed (benign colon mucosa), diverticulosis, internal hemorrhoids, next colonoscopy 09/2025. Procedure: COLONOSCOPY;  Surgeon: Lamar HERO Shaaron, MD;  Location: [facility];  Service: Endoscopy;  Laterality: N/A;  245 - Interpreter scheduled, do NOT move   ESOPHAGOGASTRODUODENOSCOPY (EGD) WITH PROPOFOL  N/A 06/11/2022    Procedure: ESOPHAGOGASTRODUODENOSCOPY (EGD) WITH PROPOFOL ;  Surgeon: Shaaron Lamar HERO, MD;  Location: [facility];  Service: Endoscopy;  Laterality: N/A;  8:15 am   GALLBLADDER SURGERY  05/26/2021   MALONEY DILATION N/A 06/11/2022   Procedure: AGAPITO DILATION;  Surgeon: Shaaron Lamar HERO, MD;  Location: [facility];  Service: Endoscopy;  Laterality: N/A;    Current Outpatient Medications  Medication Sig Dispense Refill   albuterol  (VENTOLIN  HFA) 108 (90 Base) MCG/ACT inhaler Inhale 2 puffs into the lungs every 6 (six) hours as needed for wheezing or shortness of breath. 6.7 g 1   amLODipine  (NORVASC ) 5 MG tablet Take 1 tablet (5 mg total) by mouth daily. 90 tablet 2   EPINEPHrine  0.3 mg/0.3 mL IJ SOAJ injection Inject 0.3 mg into the muscle as needed for anaphylaxis. 2 each 1   esomeprazole  (NEXIUM ) 40 MG capsule Take 1 capsule (40 mg total) by mouth 2 (two) times daily before a meal. Tomar 1 cpsula (40 mg en total) por va oral 2 (dos) veces al da antes de una comida. 60 capsule 3   Fluticasone  Furoate (ARNUITY ELLIPTA ) 100 MCG/ACT AEPB - With respiratory illness or flare ups, start Arnuity 100mcg 1 puff daily for 1-2 weeks. 30 each 5   hydrocortisone  (ANUSOL -HC) 2.5 % rectal cream Place 1 Application rectally 2 (two) times daily. Aplicar dos veces al da en el recto durante 7 das segn sea necesario para las hemorroides. 30 g 1   ipratropium (ATROVENT ) 0.06 % nasal spray Place  2 sprays into both nostrils 3 (three) times daily as needed for rhinitis. 15 mL 5   levocetirizine (XYZAL ) 5 MG tablet Take 1 tablet (5 mg total) by mouth 2 (two) times daily as needed for allergies (or hives). 60 tablet 5   levothyroxine  (SYNTHROID ) 100 MCG tablet Take 1 tablet (100 mcg total) by mouth daily. 30 tablet 3   loratadine  (CLARITIN ) 10 MG tablet Take 10 mg by mouth daily.     montelukast  (SINGULAIR ) 10 MG tablet Take 1 tablet (10 mg total) by mouth at bedtime. 30 tablet 5   polyethylene glycol powder  (GLYCOLAX /MIRALAX ) 17 GM/SCOOP powder Take by mouth once.     triamcinolone  cream (KENALOG ) 0.1 % Apply 1 Application topically 2 (two) times daily. 30 g 0   Wheat Dextrin (BENEFIBER PO) Take by mouth.     No current facility-administered medications for this visit.    Allergies as of 11/13/2023 - Review Complete 11/01/2023  Allergen Reaction Noted   Fish allergy  06/11/2017   Naproxen Rash 06/29/2019   Shellfish allergy  06/11/2017   Penicillins Hives 07/20/2015    Family History  Problem Relation Age of Onset   Allergic rhinitis Mother    Diabetes Father    Hypertension Father    Asthma Father    Stroke Maternal Grandmother    Stroke Maternal Grandfather    Colon cancer Neg Hx    Angioedema Neg Hx    Atopy Neg Hx    Immunodeficiency Neg Hx    Urticaria Neg Hx    Breast cancer Neg Hx     Social History   Socioeconomic History   Marital status: Single    Spouse name: Not on file   Number of children: 1   Years of education: Not on file   Highest education level: 3rd grade  Occupational History   Not on file  Tobacco Use   Smoking status: Never   Smokeless tobacco: Never  Vaping Use   Vaping status: Never Used  Substance and Sexual Activity   Alcohol use: No    Alcohol/week: 0.0 standard drinks of alcohol   Drug use: No   Sexual activity: Yes    Birth control/protection: Post-menopausal  Other Topics Concern   Not on file  Social History Narrative   Pt was born in [place]   Social Drivers of Health   Financial Resource Strain: Not on file  Food Insecurity: No Food Insecurity (07/22/2023)   Hunger Vital Sign    Worried About Running Out of Food in the Last Year: Never true    Ran Out of Food in the Last Year: Never true  Recent Concern: Food Insecurity - Food Insecurity Present (04/23/2023)   Hunger Vital Sign    Worried About Running Out of Food in the Last Year: Sometimes true    Ran Out of Food in the Last Year: Sometimes true  Transportation Needs:  No Transportation Needs (07/22/2023)   PRAPARE - Administrator, Civil Service (Medical): No    Lack of Transportation (Non-Medical): No  Physical Activity: Not on file  Stress: Not on file  Social Connections: Not on file    Review of Systems: Gen: Denies fever, chills, anorexia. Denies fatigue, weakness, weight loss.  CV: Denies chest pain, palpitations, syncope, peripheral edema, and claudication. Resp: Denies dyspnea at rest, cough, wheezing, coughing up blood, and pleurisy. GI: Denies vomiting blood, jaundice, and fecal incontinence.   Denies dysphagia or odynophagia. Derm: Denies rash, itching, dry  skin Psych: Denies depression, anxiety, memory loss, confusion. No homicidal or suicidal ideation.  Heme: Denies bruising, bleeding, and enlarged lymph nodes.  Physical Exam: There were no vitals taken for this visit. General:   Alert and oriented. No distress noted. Pleasant and cooperative.  Head:  Normocephalic and atraumatic. Eyes:  Conjuctiva clear without scleral icterus. Heart:  S1, S2 present without murmurs appreciated. Lungs:  Clear to auscultation bilaterally. No wheezes, rales, or rhonchi. No distress.  Abdomen:  +BS, soft, non-tender and non-distended. No rebound or guarding. No HSM or masses noted. Msk:  Symmetrical without gross deformities. Normal posture. Extremities:  Without edema. Neurologic:  Alert and  oriented x4 Psych:  Normal mood and affect.    Assessment:     Plan:  ***   Josette Centers, PA-C [facility] 11/13/2023

## 2023-11-13 ENCOUNTER — Ambulatory Visit: Payer: Self-pay | Admitting: Gastroenterology

## 2023-11-14 ENCOUNTER — Encounter: Payer: Self-pay | Admitting: Gastroenterology

## 2023-11-15 ENCOUNTER — Other Ambulatory Visit: Payer: Self-pay

## 2023-11-21 ENCOUNTER — Ambulatory Visit: Payer: Self-pay | Admitting: Internal Medicine

## 2023-12-04 ENCOUNTER — Telehealth: Payer: Self-pay | Admitting: Internal Medicine

## 2023-12-04 NOTE — Telephone Encounter (Signed)
 Patient confirmed appointment for 12/06/2023, through volunteer call.

## 2023-12-06 ENCOUNTER — Ambulatory Visit: Payer: Self-pay | Admitting: Internal Medicine

## 2024-02-14 ENCOUNTER — Ambulatory Visit: Payer: Self-pay | Admitting: Internal Medicine

## 2024-02-19 ENCOUNTER — Ambulatory Visit: Payer: Self-pay | Admitting: Physician Assistant
